# Patient Record
Sex: Female | Born: 1935 | Race: White | Hispanic: No | Smoking: Former smoker
Health system: Southern US, Community
[De-identification: ages and names within clinical notes are randomized; demographics above are authoritative.]

## PROBLEM LIST (undated history)

## (undated) DIAGNOSIS — K922 Gastrointestinal hemorrhage, unspecified: Secondary | ICD-10-CM

## (undated) DIAGNOSIS — I4891 Unspecified atrial fibrillation: Secondary | ICD-10-CM

## (undated) DIAGNOSIS — M545 Low back pain: Secondary | ICD-10-CM

## (undated) DIAGNOSIS — R5381 Other malaise: Secondary | ICD-10-CM

## (undated) DIAGNOSIS — E039 Hypothyroidism, unspecified: Secondary | ICD-10-CM

## (undated) DIAGNOSIS — IMO0001 Reserved for inherently not codable concepts without codable children: Secondary | ICD-10-CM

## (undated) DIAGNOSIS — G8929 Other chronic pain: Secondary | ICD-10-CM

## (undated) DIAGNOSIS — R109 Unspecified abdominal pain: Secondary | ICD-10-CM

## (undated) DIAGNOSIS — D75839 Thrombocytosis, unspecified: Secondary | ICD-10-CM

## (undated) DIAGNOSIS — N189 Chronic kidney disease, unspecified: Secondary | ICD-10-CM

## (undated) DIAGNOSIS — M1990 Unspecified osteoarthritis, unspecified site: Secondary | ICD-10-CM

## (undated) DIAGNOSIS — R0602 Shortness of breath: Secondary | ICD-10-CM

## (undated) DIAGNOSIS — D473 Essential (hemorrhagic) thrombocythemia: Secondary | ICD-10-CM

## (undated) DIAGNOSIS — K279 Peptic ulcer, site unspecified, unspecified as acute or chronic, without hemorrhage or perforation: Secondary | ICD-10-CM

## (undated) DIAGNOSIS — F419 Anxiety disorder, unspecified: Secondary | ICD-10-CM

## (undated) DIAGNOSIS — I10 Essential (primary) hypertension: Secondary | ICD-10-CM

## (undated) DIAGNOSIS — J189 Pneumonia, unspecified organism: Secondary | ICD-10-CM

## (undated) DIAGNOSIS — J9690 Respiratory failure, unspecified, unspecified whether with hypoxia or hypercapnia: Secondary | ICD-10-CM

## (undated) DIAGNOSIS — J449 Chronic obstructive pulmonary disease, unspecified: Secondary | ICD-10-CM

## (undated) DIAGNOSIS — D649 Anemia, unspecified: Secondary | ICD-10-CM

## (undated) DIAGNOSIS — R51 Headache: Secondary | ICD-10-CM

## (undated) DIAGNOSIS — N2889 Other specified disorders of kidney and ureter: Secondary | ICD-10-CM

## (undated) DIAGNOSIS — G500 Trigeminal neuralgia: Secondary | ICD-10-CM

## (undated) DIAGNOSIS — C189 Malignant neoplasm of colon, unspecified: Secondary | ICD-10-CM

## (undated) DIAGNOSIS — N3020 Other chronic cystitis without hematuria: Secondary | ICD-10-CM

## (undated) HISTORY — DX: Essential (hemorrhagic) thrombocythemia: D47.3

## (undated) HISTORY — DX: Malignant neoplasm of colon, unspecified: C18.9

## (undated) HISTORY — DX: Thrombocytosis, unspecified: D75.839

## (undated) HISTORY — PX: VAGINAL HYSTERECTOMY: SUR661

## (undated) HISTORY — PX: APPENDECTOMY: SHX54

## (undated) HISTORY — DX: Other malaise: R53.81

## (undated) HISTORY — DX: Anemia, unspecified: D64.9

## (undated) HISTORY — PX: CATARACT EXTRACTION W/ INTRAOCULAR LENS  IMPLANT, BILATERAL: SHX1307

---

## 1958-03-23 HISTORY — PX: CHOLECYSTECTOMY: SHX55

## 2002-09-27 ENCOUNTER — Encounter: Payer: Self-pay | Admitting: Emergency Medicine

## 2002-09-27 ENCOUNTER — Encounter: Admission: RE | Admit: 2002-09-27 | Discharge: 2002-09-27 | Payer: Self-pay | Admitting: Emergency Medicine

## 2002-10-06 ENCOUNTER — Encounter: Payer: Self-pay | Admitting: Family Medicine

## 2002-10-06 ENCOUNTER — Encounter: Admission: RE | Admit: 2002-10-06 | Discharge: 2002-10-06 | Payer: Self-pay | Admitting: Family Medicine

## 2002-10-20 ENCOUNTER — Emergency Department (HOSPITAL_COMMUNITY): Admission: EM | Admit: 2002-10-20 | Discharge: 2002-10-20 | Payer: Self-pay | Admitting: Emergency Medicine

## 2002-11-28 ENCOUNTER — Encounter: Payer: Self-pay | Admitting: Otolaryngology

## 2002-11-28 ENCOUNTER — Encounter: Admission: RE | Admit: 2002-11-28 | Discharge: 2002-11-28 | Payer: Self-pay | Admitting: Otolaryngology

## 2003-09-12 ENCOUNTER — Encounter: Admission: RE | Admit: 2003-09-12 | Discharge: 2003-09-12 | Payer: Self-pay | Admitting: Obstetrics and Gynecology

## 2003-10-16 ENCOUNTER — Encounter: Admission: RE | Admit: 2003-10-16 | Discharge: 2003-10-16 | Payer: Self-pay | Admitting: Internal Medicine

## 2003-11-22 HISTORY — PX: PERCUTANEOUS PINNING FEMORAL NECK FRACTURE: SUR1014

## 2003-11-22 HISTORY — PX: LACERATION REPAIR: SHX5168

## 2003-12-04 ENCOUNTER — Ambulatory Visit (HOSPITAL_COMMUNITY): Admission: RE | Admit: 2003-12-04 | Discharge: 2003-12-04 | Payer: Self-pay | Admitting: Gastroenterology

## 2003-12-11 ENCOUNTER — Inpatient Hospital Stay (HOSPITAL_COMMUNITY): Admission: EM | Admit: 2003-12-11 | Discharge: 2003-12-14 | Payer: Self-pay | Admitting: Emergency Medicine

## 2003-12-14 ENCOUNTER — Ambulatory Visit: Payer: Self-pay | Admitting: Physical Medicine & Rehabilitation

## 2003-12-14 ENCOUNTER — Inpatient Hospital Stay: Admission: RE | Admit: 2003-12-14 | Discharge: 2003-12-26 | Payer: Self-pay | Admitting: *Deleted

## 2004-01-28 ENCOUNTER — Ambulatory Visit (HOSPITAL_COMMUNITY): Admission: RE | Admit: 2004-01-28 | Discharge: 2004-01-28 | Payer: Self-pay | Admitting: Orthopedic Surgery

## 2004-07-17 ENCOUNTER — Encounter: Admission: RE | Admit: 2004-07-17 | Discharge: 2004-07-17 | Payer: Self-pay | Admitting: Internal Medicine

## 2004-12-09 ENCOUNTER — Ambulatory Visit: Admission: RE | Admit: 2004-12-09 | Discharge: 2004-12-09 | Payer: Self-pay | Admitting: Internal Medicine

## 2005-01-09 ENCOUNTER — Encounter: Admission: RE | Admit: 2005-01-09 | Discharge: 2005-01-09 | Payer: Self-pay | Admitting: Internal Medicine

## 2005-07-14 ENCOUNTER — Ambulatory Visit: Admission: RE | Admit: 2005-07-14 | Discharge: 2005-07-14 | Payer: Self-pay | Admitting: Gynecologic Oncology

## 2005-11-10 ENCOUNTER — Encounter: Payer: Self-pay | Admitting: Cardiology

## 2005-11-18 ENCOUNTER — Ambulatory Visit (HOSPITAL_COMMUNITY): Admission: RE | Admit: 2005-11-18 | Discharge: 2005-11-18 | Payer: Self-pay | Admitting: Internal Medicine

## 2005-12-09 ENCOUNTER — Ambulatory Visit: Admission: RE | Admit: 2005-12-09 | Discharge: 2005-12-09 | Payer: Self-pay | Admitting: Gynecologic Oncology

## 2005-12-21 HISTORY — PX: BILATERAL SALPINGOOPHORECTOMY: SHX1223

## 2006-01-11 ENCOUNTER — Emergency Department (HOSPITAL_COMMUNITY): Admission: EM | Admit: 2006-01-11 | Discharge: 2006-01-11 | Payer: Self-pay | Admitting: Emergency Medicine

## 2006-01-19 ENCOUNTER — Encounter (INDEPENDENT_AMBULATORY_CARE_PROVIDER_SITE_OTHER): Payer: Self-pay | Admitting: *Deleted

## 2006-01-19 ENCOUNTER — Ambulatory Visit (HOSPITAL_COMMUNITY): Admission: RE | Admit: 2006-01-19 | Discharge: 2006-01-19 | Payer: Self-pay | Admitting: Gynecologic Oncology

## 2006-02-03 ENCOUNTER — Ambulatory Visit: Admission: RE | Admit: 2006-02-03 | Discharge: 2006-02-03 | Payer: Self-pay | Admitting: Gynecologic Oncology

## 2006-08-18 ENCOUNTER — Emergency Department (HOSPITAL_COMMUNITY): Admission: EM | Admit: 2006-08-18 | Discharge: 2006-08-18 | Payer: Self-pay | Admitting: Emergency Medicine

## 2006-08-26 ENCOUNTER — Ambulatory Visit: Payer: Self-pay | Admitting: Gastroenterology

## 2007-01-24 DIAGNOSIS — I10 Essential (primary) hypertension: Secondary | ICD-10-CM

## 2007-01-24 DIAGNOSIS — J309 Allergic rhinitis, unspecified: Secondary | ICD-10-CM

## 2007-01-24 DIAGNOSIS — E039 Hypothyroidism, unspecified: Secondary | ICD-10-CM

## 2007-08-08 DIAGNOSIS — F411 Generalized anxiety disorder: Secondary | ICD-10-CM

## 2007-08-08 DIAGNOSIS — Z9189 Other specified personal risk factors, not elsewhere classified: Secondary | ICD-10-CM | POA: Insufficient documentation

## 2007-08-08 DIAGNOSIS — F329 Major depressive disorder, single episode, unspecified: Secondary | ICD-10-CM

## 2007-08-24 ENCOUNTER — Encounter: Admission: RE | Admit: 2007-08-24 | Discharge: 2007-08-24 | Payer: Self-pay | Admitting: Gastroenterology

## 2007-11-30 ENCOUNTER — Ambulatory Visit: Payer: Self-pay | Admitting: Vascular Surgery

## 2008-01-17 ENCOUNTER — Inpatient Hospital Stay (HOSPITAL_COMMUNITY): Admission: AD | Admit: 2008-01-17 | Discharge: 2008-01-20 | Payer: Self-pay | Admitting: Internal Medicine

## 2009-06-06 ENCOUNTER — Encounter: Payer: Self-pay | Admitting: Cardiology

## 2009-07-22 ENCOUNTER — Encounter: Admission: RE | Admit: 2009-07-22 | Discharge: 2009-07-22 | Payer: Self-pay | Admitting: Cardiology

## 2009-11-14 ENCOUNTER — Ambulatory Visit: Payer: Self-pay | Admitting: Cardiology

## 2010-04-12 ENCOUNTER — Encounter: Payer: Self-pay | Admitting: Internal Medicine

## 2010-04-13 ENCOUNTER — Encounter: Payer: Self-pay | Admitting: *Deleted

## 2010-08-05 NOTE — Letter (Signed)
August 26, 2006    Kari Baars, M.D.  [address]  [place], [place] [address]   RE:  [name], [name]  MRN:  [id number]  /  DOB:  09-05-1930   Dear Dr. Clelia Croft:   Upon your kind referral, I had the pleasure of evaluating your patient  and I am pleased to offer my findings.  I saw Natalie [name] in the  office today.  Enclosed is a copy of my progress note that details my  findings and recommendations.   Thank you for the opportunity to participate in your patient's care.    Sincerely,      Barbette Hair. Arlyce Dice, MD,FACG  Electronically Signed    RDK/MedQ  DD: 08/26/2006  DT: 08/26/2006  Job #: [id number]

## 2010-08-05 NOTE — Procedures (Signed)
DUPLEX DEEP VENOUS EXAM - LOWER EXTREMITY   INDICATION:   HISTORY:  Edema:  Bilateral lower extremity edema.  Trauma/Surgery:  Patient had a left hip fracture four years ago.  Pain:  Left leg pain.  PE:  No.  Previous DVT:  No.  Anticoagulants:  No.  Other:  Patient is on antihypertensive.   DUPLEX EXAM:                CFV   SFV   PopV  PTV    GSV                R  L  R  L  R  L  R   L  R  L  Thrombosis    O  o  o  o  o  o  o   o  o  o  Spontaneous   +  +  +  +  +  +  +   +  +  +  Phasic        +  +  +  +  +  +  +   +  +  +  Augmentation  +  +  +  +  +  +  +   +  +  +  Compressible  +  +  +  +  +  +  +   +  +  +  Competent     +  +  +  +  +  +  +   +  +  +   Legend:  + - yes  o - no  p - partial  D - decreased   IMPRESSION:  1. No evidence of deep venous thrombus, superficial venous thrombus,      or baker's cyst bilaterally.  2. No significant venous reflux identified bilaterally in the deep or      superficial system.    _____________________________  Di Kindle. Edilia Bo, M.D.   MC/MEDQ  D:  11/30/2007  T:  11/30/2007  Job:  [id number]

## 2010-08-05 NOTE — H&P (Signed)
Natalie [name], Natalie [name]             ACCOUNT NO.:  [id number]   MEDICAL RECORD NO.:  [id number]          PATIENT TYPE:  INP   LOCATION:  5120                         FACILITY:  [facility]   PHYSICIAN:  Kari Baars, M.D.  DATE OF BIRTH:  1930/07/24   DATE OF ADMISSION:  01/17/2008  DATE OF DISCHARGE:                              HISTORY & PHYSICAL   CHIEF COMPLAINT:  Abdominal pain.   HISTORY OF PRESENT ILLNESS:  Natalie [name] is a 77 year old white female  with a history of irritable bowel syndrome, chronic pain syndrome, and  prior left adnexal mass status post laparoscopic bilateral salpingo-  oophorectomy (October 2007) with benign pathology who presented to the  office today with persistent abdominal pain for the past 3 weeks.  The  patient has a long history of chronic abdominal pain for which she has  seen several physicians including Dr. Jeani Hawking, Dr. Melvia Heaps,  and Dr. Vida Rigger.  This has been extensively evaluated in the past  with repeated CT scan.  Her most recent CT scan prior to this episode  was in June 2009 at [facility], which showed no acute findings  or abnormalities.  Her last colonoscopy was in September 2005 by Dr.  Elnoria Howard and did reveal a small rectal polyp.  He recommended at that time  followup colonoscopy in 5-7 years.  I do not have the pathology from  that.  She was last seen in our office on January 03, 2008, following a  medication change for her antihypertensive regimen.  She was having some  lower extremity swelling on Lotrel and was switched to Lotensin HCT.  She states that this was a pill from hell and associated it with  increased fatigue and blurry vision.  Her swelling had resolved off the  amlodipine, and it was recommended that she continue this medication for  several more weeks to see if her side effects would resolve given her  prior medication intolerances.  She subsequently called on January 12, 2008, reporting severe  abdominal pain for several weeks.  She was taking  Darvocet and Ultram for this pain without relief.  At that time, we have  recommended a repeat CT of the abdomen and pelvis which was performed at  [facility] on January 13, 2008.  The radiologist called me with the  report indicating acute sigmoid diverticulitis would suspect contained  microperforation but no abscess.  Of note, there also was a left renal  lesion, suspicious for solid mass with recommendations for followup  evaluation.  The prior left adnexal cystic mass, which was removed in  October 2007 was completely resolved.  Based on the CT findings, she was  started on Cipro and Flagyl 5 days ago.  She called this weekend with  persistent abdominal pain and it was recommended she be evaluated in the  emergency department.  She did not follow this recommendation and was  contacted again yesterday with persistent abdominal pain.  We offered an  office visit, but she stated that she was unable to come in.  We have  recommended that she go  to the emergency department via EMS, which she  declined.  This appointment was scheduled for her today, at which time  she states that she is continuing to have severe lower quadrant pain.  She describes this as sharp pain which is worse with movement.  It is  similar to her prior pain but has been much worse over the past 3 weeks.  There appears to be no change in her bowel movement consistency.  She  had a bowel movement this morning.  She denies blood in her stool.  No  nausea or vomiting.  No fevers, chills, or sweats.  She does have  decrease in appetite and fatigue.  She does not feel that her pain is  any better with the Cipro and Flagyl.  Therefore, she will be admitted  for further evaluation and management.   PAST MEDICAL HISTORY:  1. Hypertension.  2. Hypothyroidism.  3. Osteoporosis.  4. Irritable bowel syndrome.  5. Left adnexal mass status post laparoscopic bilateral  salpingo-      oophorectomy (October 2007).  6. Trigeminal neuralgia.  7. History of shingles, left shoulder.  8. Chronic pain syndrome.  9. Multiple medication intolerances.  10.Left hip fracture status post pinning (2006).  11.Status post cholecystectomy.  12.Status post hysterectomy.  13.She thinks she has had her appendix out as well.   CURRENT MEDICATIONS:  1. Lorazepam 1 mg t.i.d.  2. Levothyroxine 100 mcg one-half daily.  3. Aspirin 81 mg daily.  4. Ultram 50 mg t.i.d. p.r.n.  5. Lotensin HCT 10/12.5 daily.   ALLERGIES:  1. Multiple SSRIs.  2. LYRICA.   SOCIAL HISTORY:  She is a widow of 16 years.  She is divorced.  She has  3 children and 3 grandsons.  Retired.  She previously worked with her  daughter who owns [employer].  She is a positive smoker, about 5-6  cigarettes per day.  Social alcohol for 40 years.  Denies any alcohol  abuse.  No drug use.   FAMILY HISTORY:  Father died at 32 of old age complications.  Mother  died at 51 from stroke.  She has a brother with coronary disease status  post stent.  Her 2 daughters and son are healthy.   PHYSICAL EXAMINATION:  VITAL SIGNS:  Temperature 98.0, pulse 72, blood  pressure 148/90, respirations 16, and weight 148 pounds and stable.  GENERAL:  She appears comfortable when sitting but grimaces when she  moves.  HEENT:  Pupils equal, round, and reactive to light.  Extraocular  movements intact.  Oropharynx moist without erythema.  NECK:  Supple without lymphadenopathy or JVD.  HEART:  Regular rate and rhythm without murmurs, rubs, or gallops.  LUNGS:  Clear to auscultation bilaterally.  ABDOMEN:  Soft and nondistended with normoactive bowel sounds.  She does  have right lower quadrant tenderness.  No mass appreciated.  No rebound  or guarding.  SKIN:  No rash.  EXTREMITIES:  No clubbing, cyanosis, or edema.  NEUROLOGIC:  Alert and oriented x4.   STUDIES:  CT of the abdomen and pelvis with contrast (January 13, 2008,  at [facility]) acute sigmoid diverticulitis would suspect contained  microperforation.  No mature abscess formation is evident.  Followup  recommended.  Small left lower kidney lesion, suspect for solid mass.  Further assessment with dedicated pre and postcontrast renal CT is  recommended.  Focal mid right cortical kidney volume loss/scar.  Multiple low density right renal lesions, likely minimally complex renal  cyst.  Small hiatal hernia.  Nondilatory calcific atherosclerotic  vascular disease.  Lumbar scoliosis with degenerative osseous changes.  Resolved left adnexal cystic mass.   Labs obtained on January 03, 2008, include a BMET which was normal  except for sodium of 130 which was stable.  CBC was normal.  Thyroid  function normal.  B12 greater than 1000.   ASSESSMENT/PLAN:  1. Bilateral lower quadrant abdominal pain - her subacute presentation      over the past 3 weeks and lack of fever is a bit atypical for      diverticulitis, though her CT on January 13, 2008, was suggestive      that this is the etiology of her pain.  Given her persistent      symptoms and abdominal tenderness, we will admit her for further      evaluation and management with IV antibiotics including Cipro and      Flagyl and pain management with Ultram and morphine as needed.  We      will repeat a CT with contrast to further evaluate the cause of her      pain and to rule out associated abscess as a complication of      diverticulitis.  If no clear cause is seen, we will consider GI or      surgical consult.  She does have a history of chronic pain, which      makes interpretation of her symptoms a bit difficult.  Obtain CBC,      CMET, lactic acid, urinalysis, and blood cultures.  2. Renal mass - we will further evaluate with this CT with plans to      evaluate this further as an outpatient.  I do not think that this      is at all related her pain.  3. Hypertension - continue Lotensin HCT for  now.  Her blood pressure      is of marginal control today on a lower dose of this medication      since her last visit.  4. Hypothyroidism - continue Synthroid.  Recent thyroid function      normal.  5. Deep venous thrombosis prophylaxis with Lovenox.  6. Fluids, electrolytes, nutrition - clear liquid diet.  We will      advance as tolerated with cardiac prudent.  7. Disposition - anticipate that she will be able to return home once      her symptoms are improving.  Her daughter lives in town and has      been available to help as needed.      Kari Baars, M.D.  Electronically Signed     WS/MEDQ  D:  01/17/2008  T:  01/18/2008  Job:  [id number]   cc:   Petra Kuba, M.D.

## 2010-08-05 NOTE — Assessment & Plan Note (Signed)
[facility]                         GASTROENTEROLOGY OFFICE NOTE   NAME:Natalie [name], Natalie [name]                    MRN:          [id number]  DATE:08/26/2006                            DOB:          01-25-1931    PROBLEM:  Abdominal pain.   Natalie [name] is a 77 year old, white female referred through the courtesy  of Dr. Clelia Croft for evaluation.  She has been suffering from chronic, lower  abdominal pain.  She describes the pain as radiating from her right  upper quadrant down to her suprapubic area.  It is chronic.  It is not  affected by eating or moving her bowels.  She has undergone extensive  workup in the past including CT scans and colonoscopy.  Nothing  diagnostic was found.  She is addicted to Lorazepam.  She has panic  attacks and claims that she is chronically nervous because of her pain.  There is no history of melena or hematochezia.  Appetite is good and  weight is stable.  She was told in the past to have fibromyalgia.  She  suffers from anxiety, depression and chronic headaches.  She is status  post cholecystectomy, hysterectomy and bilateral oophorectomy.   FAMILY HISTORY:  Noncontributory.   MEDICATIONS:  Lorazepam, Benazepril, Norvasc, baby aspirin, Synthroid  and Macrobid.   ALLERGIES:  No known drug allergies.   SOCIAL HISTORY:  She smokes two to six cigarettes a day.  She does not  drink.  She is widowed and retired.   REVIEW OF SYSTEMS:  Positive for joint pains, back pains, severe  fatigue, shortness of breath and muscle cramps.   PHYSICAL EXAMINATION:  VITAL SIGNS:  Pulse 64, blood pressure 114/66,  weight 163.  HEENT:  EOMI. PERRLA. Sclerae are anicteric.  Conjunctivae are pink.  NECK:  Supple without thyromegaly, adenopathy or carotid bruits.  CHEST:  Clear to auscultation and percussion without adventitious  sounds.  CARDIAC:  Regular rhythm; normal S1 S2.  There are no murmurs, gallops  or rubs.  ABDOMEN:  There is mild  periumbilical tenderness without guarding or  rebound.  There are no abdominal masses or organomegaly.  Bowel sounds  are normoactive.  Abdomen is soft, non-tender and non-distended.  EXTREMITIES:  Full range of motion.  No cyanosis, clubbing or edema.  RECTAL:  Deferred.   IMPRESSION:  1. Chronic abdominal pain.  I believe that she has chronic abdominal      pain syndrome.  Fibromyalgia may be contributing.  I think it is      unlikely that she has intra-abdominal pathology including colonic      disease aside from irritable bowel syndrome.  2. Anxiety and depression.  3. Lorazepam addiction.   RECOMMENDATION:  1. Trial of Elavil 50 mg nightly (will start with 25 mg nightly x1      week).  2. If symptoms are not improved with therapy with Elavil, I will refer      her to the pain management clinic.     Barbette Hair. Arlyce Dice, MD,FACG  Electronically Signed    RDK/MedQ  DD: 08/26/2006  DT: 08/26/2006  Job #: [id number]  cc:   Kari Baars, M.D.

## 2010-08-08 NOTE — Consult Note (Signed)
NAMEALONNI, Natalie [name]             ACCOUNT NO.:  [id number]   MEDICAL RECORD NO.:  [id number]          PATIENT TYPE:  INP   LOCATION:  0101                         FACILITY:  [facility]   PHYSICIAN:  Elliot Cousin, M.D.    DATE OF BIRTH:  04-29-1930   DATE OF CONSULTATION:  12/11/2003  DATE OF DISCHARGE:                                   CONSULTATION   REASON FOR CONSULTATION:  Medical clearance and medical management.   HISTORY OF PRESENT ILLNESS:  The patient is a 77 year old lady with a  history of hypertension, anxiety, and hypothyroidism, who presented to the  emergency room  today after sustaining a fall this morning.  The patient was  on the way back from taking out her trash when she mis-stepped on her steps  outside at home.  The patient fell over on her left side sustaining a left  forehead contusion, left hand abrasion, and left pelvic and hip pain.  When  the patient arrived to the emergency department, a radiograph of her left  hip and pelvis were ordered.  The radiograph revealed that the patient had  impacted left mid cervical hip fracture.  The patient does have some  intermittent left groin and hip pain which is radiating down her thigh. The  patient states that the pain is more intense if she tries to raise or  manipulate her left leg.  The patient had no previous history of falls.  She  had no black out spells and she denied being dizzy this morning.  She denied  any loss of consciousness prior to or following the fall.  She denied any  prodromal chest pain, shortness of breath, or dizziness.  She has had no  recent fever, chills, night sweats, or pleurisy.  She was recently started  on treatment with amoxicillin for sinusitis.  The patient's past medical  history is negative for any history of myocardial infarction, heart failure,  coronary artery disease, or stroke.  She does have hypertension and she says  her blood pressures are well controlled on Lotrel 5/20 mg  daily.   PAST MEDICAL HISTORY:  1.  Hypertension.  2.  Hypothyroidism.  3.  Irritable bowel syndrome.  4.  Recent colonoscopy last week by Dr. Elnoria Howard, reported as negative.  5.  Chronic anxiety.  6.  Seasonal allergies.  7.  Recent treatment for sinusitis.  8.  Status post cholecystectomy in the past.  9.  Status post total abdominal hysterectomy secondary to fibroids      approximately 40 years ago.   MEDICATIONS:  1.  Vicodin 5 mg q.6h. p.r.n. pain.  2.  Zoloft 12.5 mg daily.  3.  Lotrel 5/10 mg daily.  4.  Levothyroxine 50 mg daily.  5.  Lorazepam 1 mg t.i.d. p.r.n.  6.  Metamucil daily.  7.  Bentyl 10 mg q.6h. p.r.n. irritable bowel symptoms.   ALLERGIES:  No known drug allergies.   SOCIAL HISTORY:  The patient is widowed.  She lives in [place] alone.  She has three grown children.  Her daughter, Antony Odea, is present  today.  Ms.  Parker's phone number is [phone].  The patient is a part owner  of a local clothing store.  She still drives.  She can read and write.  She  drinks occasional alcohol.  She states she is a closet smoker.  She smokes  5-6 cigarettes per day and has been doing so on and off for the past 40  years.  The patient states she is not very active but she is totally and  independently ambulatory.   FAMILY HISTORY:  The patient's father died of prostate cancer and myocardial  infarction at the age of 50.  Her mother died at 32 years of age secondary  to a stroke.  She has one brother who is 74 years of age and has a history  of coronary artery disease status post angioplasty and hypertension.   REVIEW OF SYMPTOMS:  Positive for anxiousness and chronic anxiety.  She does  have occasional nasal congestion and sinus headaches.  She, otherwise, has a  negative review of systems.   PHYSICAL EXAMINATION:  VITAL SIGNS:  Temperature 97.3, blood pressure 140/89, pulse 95, respiratory  rate 18, oxygen saturation 93%.  GENERAL:  The patient is a  pleasant, overweight 77 year old Caucasian lady  who is currently lying in bed in no acute distress.  HEENT:  Head is normocephalic.  The patient does have a left sided  abrasion/contusion above the left eye on the lateral forehead.  There is  mild swelling and mild oozing of blood.  The patient has had no trauma to  her eyes.  Pupils equal, round, reactive to light.  Extraocular movements  intact.  Conjunctivae clear.  Sclerae white.  Nasal mucosa is mildly dry.  The patient has no sinus tenderness on exam of her ethmoid sinuses and her  frontal and maxillary sinuses.  No nasal drainage.  Oropharynx reveals  mildly dry mucous membranes.  The patient's teeth are in good condition.  No  posterior exudates or erythema.  NECK:  Supple, no adenopathy, no thyromegaly, no JVD, no bruits.  LUNGS:  Clear to auscultation bilaterally although there are some decreased  breath sounds in the bases.  No evidence of wheezing or crackles.  HEART:  S1 and S2 with an ectopic beat with a soft systolic murmur.  ABDOMEN:  Well healed right upper quadrant scar.  The abdomen is obese,  positive bowel sounds, soft, nontender, nondistended, no hepatosplenomegaly,  no masses palpated.  EXTREMITIES:  The patient does have some tenderness over the left groin and  left hip area.  No tenderness of her joints or extremities otherwise.  The  patient has good range of motion of all her joints with the exception of the  left lower extremity.  She has trace pedal edema bilaterally.  Pedal pulses  are palpable bilaterally.  She has mild arthritic changes in her knees and  hands bilaterally.  SKIN:  As stated above, the patient has a mild abrasion and contusion of her  left forehead.  NEUROLOGICAL:  The patient is alert and oriented x 3.  Cranial nerves 2-12  are intact.  Strength is 5/5 throughout with the exception of the left lower extremity which is restricted in motion secondary to pain and fracture.  Sensation is  intact throughout.   ADMISSION LABORATORY DATA:  EKG reveals normal sinus rhythm with nonspecific  T wave abnormalities and PACs.  Chest x-ray reveals mild peribronchial  thickening and mild bibasilar atelectasis.  Left hip film reveals a left  impacted mid cervical  hip fracture.  WBC 17.8, hemoglobin 13.3, hematocrit  39.3, MCV 87, platelets 365.  Sodium 128, potassium 3.8, chloride 95, CO2  25, glucose 119, BUN 8, creatinine 0.5, calcium 9.1, total protein 6.1,  albumin 3.6, AST 74, ALT 52, alkaline phosphatase 73, total bilirubin 1.1.   ASSESSMENT:  1.  The patient is a 77 year old lady who presents with a left hip fracture      secondary to an accidental fall.  No indications of syncope or loss of      consciousness secondary to the fall or prior to the fall.  There are no      focal neurological findings on exam with the exception of a decrease in      mobility secondary to the left hip fracture.  The patient's EKG is      unremarkable and the patient's chest x-ray is without overt      cardiomegaly.  Her surgical risk is average for her age.  She has no      apparent cardiovascular disease with the exception of hypertension.      There is no immediate reason to not proceed with the surgical correction      of the left hip fracture.  2.  Leukocytosis.  The leukocytosis may simply be a reactive leukocytosis      secondary to the fall.  We will also consider sinusitis and bronchitis      as possible etiologies for the leukocytosis.  The patient is, however,      afebrile.  3.  Hyponatremia.  The patient's serum sodium on admission is 128.  The      hyponatremia may simply be secondary to volume depletion and possibly      secondary to the affects of Zoloft which can cause hyponatremia.  4.  Hypertension.  The patient is treated as an outpatient with Lotrel 5/20      mg daily.  5.  Hypothyroidism.  The patient is treated with Levothyroxine 50 mcg daily.   PLAN/RECOMMENDATIONS:  1.   Would proceed with the surgery.  2.  Avoid perioperative hypotonic IV fluids.  Would recommend normal saline      with potassium chloride added rather than half normal saline.  3.  Recommend starting empiric Avelox to cover both sinusitis and      bronchitis.  4.  Wound care to the left forehead abrasion.  5.  Restart home medications.  6.  Recheck a CBC and chemistry panel in the a.m.  7.  Thank you for the consult, will follow with you.      DF/MEDQ  D:  12/11/2003  T:  12/11/2003  Job:  [id number]   cc:   Ralene Ok, M.D.  [address]  [place]  [place] [address]  Fax: [phone]

## 2010-08-08 NOTE — Consult Note (Signed)
Natalie [name], CROAK             ACCOUNT NO.:  [id number]   MEDICAL RECORD NO.:  [id number]          PATIENT TYPE:  OUT   LOCATION:  GYN                          FACILITY:  [facility]   PHYSICIAN:  Paola A. Duard Brady, MD    DATE OF BIRTH:  May 09, 1930   DATE OF CONSULTATION:  12/09/2004  DATE OF DISCHARGE:  12/09/2004                                   CONSULTATION   REFERRING PHYSICIAN:  Dr. Katherine Mantle.  The patient is seen today in  consultation at the request of Dr. Laurena Bering.   Ms. Flicker is a 77 year old, who was referred to [place] secondary to a complex  adnexal mass.  Her history dates back per my records to July 2004.  At that  time, she had a 4.1 x 3.8 cm cystic lesion within the mid pole of the right  kidney.  Within pelvis, she had a 4.6 x 4.2 cm left adnexal cystic lesion as  well as significant diverticula.  It was decided to follow this, and she had  a repeat ultrasound September 12, 2003, which revealed a 5.3 x 4.4 x 5.7 cm  cystic abnormality within the left adnexa with a single curvilinear internal  septation.  No significant mural nodularity.  They could not identify the  right ovary.  Again, this lesion continued to be followed.  A CT scan was  obtained October 16, 2003, at which time she had a 5.8 x 4.6 cm mass in the  left adnexa which was complex in appearance.  And this continued to be  followed and most recently, she had a CT scan April 2006, at which time  there was a cystic lesion within the adnexa measuring 6.4 x 5.0 cm.  It was  felt to be larger than the prior CT scan of the pelvis in July 2004,  appeared to be slow growing.  She was seen in consultation by Dr. Soledad Gerlach  in May 2006 to follow up these lesions.  Of note, she has always had a  normal CA 125, the most recent one being in May 2006 which was 14.7.  At  that time, there had been a lengthy discussion with the patient and her  daughter.  It was recommended to proceed with surgical resection of the left  adnexal  mass with BSO and frozen section, and it was felt that this could be  accomplished laparoscopically.  It was also subsequently recommended that if  she wished not to proceed with surgery, she would have repeat imaging in 3  months, and the patient was to follow up with him which did not occur.  She  returns to see [place] today.  Of note, she has otherwise been fairly well.  She  complains of occasional UTI.  She states she has lost about 12 pounds in the  past year and a half secondary to significant stress, and her daughter  interjects that she has a significant amount of stress as related to this  pelvic mass.  She has been not sure when she was menopausal.  She does not  have recollection of hot  flashes.  She does complain of pain for about a  year, but it is difficult to attribute that to the pelvic mass versus her  irritable bowel syndrome or her hip fracture.  She fell and injured her hip  a year ago and has several pins on the left side.  She has complained of  being full and bloated with eating.  She has gastroesophageal reflux disease  which was diagnosed since her irritable bowel syndrome was diagnosed about 2  years ago.  Her IBS is characterized by 50% of the time she has diarrhea.  The other 50%, she has constipation.  She denies any bright red blood per  rectum.  She had a negative colonoscopy a year ago.   PAST GYN HISTORY:  She is a gravida 3, para 3, has never been on hormone  replacement therapy.  She had a vaginal hysterectomy for benign disease.   REVIEW OF SYSTEMS:  As above.   PAST MEDICAL HISTORY:  1.  Hypertension.  2.  Hypothyroidism.  3.  Irritable bowel syndrome.  4.  Gastroesophageal reflux disease.   PAST SURGICAL HISTORY:  1.  Vaginal hysterectomy.  2.  Cholecystectomy, open.  3.  Left hip fracture a year ago.   MEDICATIONS:  Lotrel, Synthroid, Ativan, baby aspirin, Prilosec, Metamucil.   ALLERGIES:  None.   SOCIAL HISTORY:  She is widowed.  She has 2  daughters and a son.  She smokes  cigarettes occasionally.  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   HEALTH MAINTENANCE:  She had a negative colonoscopy in 2005.  She has never  had a mammogram.   PHYSICAL EXAMINATION:  VITAL SIGNS:  Height 5 feet 3 inches.  Weight 136  pounds.  Blood pressure 130/80, pulse 72, respirations 18.  GENERAL:  Well-nourished, well-developed female in no acute distress.  NECK:  Supple.  There is no lymphadenopathy, no thyromegaly.  LUNGS:  Clear to auscultation bilaterally.  CARDIOVASCULAR EXAM:  Regular rate and rhythm.  ABDOMEN:  Soft, nontender, nondistended.  There are no palpable masses or  hepatosplenomegaly.  Groins were negative for adenopathy.  EXTREMITIES:  There is no edema.  PELVIC/BIMANUAL EXAMINATION:  A 7 cm mass which appears to be mid pelvis  __________ localized to the left side.  It is smooth.  There is no  nodularity.  It is nonmobile.  Rectovaginal confirms.   ASSESSMENT:  A 77 year old with persistent complex-appearing ovarian mass.  The patient went for a repeat ultrasound today since she had not been imaged  since earlier in the spring.  Ultrasound today revealed a persistent mass  with a single septation.  A lengthy discussion was held with the patient and  her daughter at this point.  They are leaning towards wishing to proceed  with laparoscopic evaluation.  She is tentatively scheduled for a  laparoscopic  oophorectomy on October 17.  Risks and benefits of the surgery including  bleeding, infection, injury to surrounding organs, thromboembolic disease  were discussed with the patient and her daughter, and they wish to proceed.  She will come in and be scheduled for a preop appointment.      Paola A. Duard Brady, MD  Electronically Signed     PAG/MEDQ  D:  12/11/2004  T:  12/12/2004  Job:  [id number]   cc:   Andres Ege, M.D.  Fax: [phone]   Ralene Ok, M.D.  Fax: [phone]   S. Kyra Manges, M.D.   Fax: [phone]  Hazle Coca  Fax: [phone]   Telford Nab, R.N.  [address]  [place], [place] [address]

## 2010-08-08 NOTE — Consult Note (Signed)
[name], Natalie [name]             ACCOUNT NO.:  [id number]   MEDICAL RECORD NO.:  [id number]          PATIENT TYPE:  OUT   LOCATION:  GYN                          FACILITY:  [facility]   PHYSICIAN:  Paola A. Duard Brady, MD    DATE OF BIRTH:  1930/12/07   DATE OF CONSULTATION:  DATE OF DISCHARGE:                                 CONSULTATION   Natalie Natalie [name] is a 77 year old who I have been following for several years  for a complex left adnexal mass.  She has been seen very regularly with  a fairly stable mass though the patient complains of significant issues  of pain on a fairly regular basis and has been seen in the emergency  room several times.  She subsequently opted for surgery on January 19, 2006, and underwent diagnostic laparoscopy with bilateral salpingo-  oophorectomy.  At that time, operative findings included a 6-cm simple  unilocular left ovarian cyst with minimal adhesive disease of the  epiploica overlying the cyst.  There was a normal atrophic appearing  right ovary.  Left ovarian cyst fluid was negative.  The cyst was not  ruptured interoperatively in the abdomen, it was within an EndoCatch  bag.  The ovarian mass was negative as were the washings.  She comes in  today for her postoperative check.  She continues to complain of  abdominal pain.  She states the abdominal pain that she currently has is  very similar to the pain that she had before surgery.  She does not  think that we have been able to improve her pain.  She asked several  questions today regarding whether we thought that the pain was because  she was crazy or if the pain was in her head and is related to her  depression.  She was asked whether or not she needed ECT.  The patient,  from a postoperative standpoint, states that her pain is debilitating,  again it was present before the surgery, it wakes her up at night.  This  morning she states she woke up to 1:30 and was not able to fall back  asleep, however,  on further questioning she states that she took a  tramadol later and slept the rest of the evening.  She states she is  eating well.  She complains of constipation secondary to her pain  medication.  She took Metamucil this morning and has not had any benefit  from that but she thinks that most of her symptoms might be related to  her irritable bowel syndrome, though she is discouraged that other  physicians do not think that is the case.  She also believes that she  has fibromyalgia which is also not being adequately treated. I discussed  with her that I do not know the etiology of her pain.  But I do not  think it was related to this ovarian mass nor have I ever felt that it  was related to the ovarian mass.   PHYSICAL EXAMINATION:  VITAL SIGNS:  Weight 134 pounds which is stable.  GENERAL:  Well developed, well nourished female  in no acute distress.  ABDOMEN:  Shows well healed laparoscopy skin incisions.  On abdominal  examination, she has no pain whatsoever to exam. The abdomen is soft and  nontender.  However, when you question the patient whether she has pain  she states yes, though there was no grimacing during the physical exam.   ASSESSMENT:  77 year old with benign serous cystadenoma of the left  ovary and benign right adnexa as well who continues to have chronic  abdominal pain that I do not think is gynecologic in origin.   PLAN:  I would recommend that she continue follow-up with her physicians  regarding her irritable bowel syndrome as well as her fibromyalgias.  I  have also asked her to see Dr. Clelia Croft.  I did speak with Dr. Clelia Croft in April  2007 regarding anti-depressants.  She states that she did discuss this  with him but has not given him an answer. I do think this would be  important follow-up for her.  She does seem depressed and is requesting  prescription for lorazepam.  I gave her a prescription for lorazepam one  p.o. t.i.d.,  #21 for 7 days.  Hopefully in that  interim of time, she can see her  primary physicians regarding this and her workup and treatment for her  pelvic and abdominal pain will continue.  She does not have a malignant  process.  She knows that we will be happy to see her on a p.r.n. basis  if necessary.      Paola A. Duard Brady, MD  Electronically Signed     PAG/MEDQ  D:  02/03/2006  T:  02/03/2006  Job:  [id number]   cc:   Kari Baars, M.D.  Fax: [phone]   Barry Dienes. Eloise Harman, M.D.  Fax: [phone]   Telford Nab, R.N.  [address]  [place], [place] [address]   Edwena Felty. Romine, M.D.  Fax: [phone]

## 2010-08-08 NOTE — Op Note (Signed)
Natalie [name], Natalie [name]             ACCOUNT NO.:  [id number]   MEDICAL RECORD NO.:  [id number]          PATIENT TYPE:  AMB   LOCATION:  DAY                          FACILITY:  [facility]   PHYSICIAN:  Paola A. Duard Brady, MD    DATE OF BIRTH:  1930/04/06   DATE OF PROCEDURE:  01/19/2006  DATE OF DISCHARGE:                                 OPERATIVE REPORT   PREOPERATIVE DIAGNOSIS:  Persistent left adnexal mass, normal CA-125.   POSTOPERATIVE DIAGNOSIS:  Persistent left adnexal mass, normal CA-125.   PROCEDURE:  Laparoscopic bilateral salpingo-oophorectomy.   SURGEON:  Paola A. Duard Brady, M.D. and Roseanna Rainbow, M.D.   ANESTHESIA:  General.   ANESTHESIOLOGIST:  Jenelle Mages. Fortune, M.D.   SPECIMENS:  Bilateral tubes and ovaries, washings and cyst fluid.   ESTIMATED BLOOD LOSS:  Minimal.   IV FLUIDS:  Was 1600 mL.   URINE OUTPUT:  Was 100 mL.   COMPLICATIONS:  None.   OPERATIVE FINDINGS:  Included a 6 cm simple unilocular left ovarian cyst  with minimal adhesive disease of the epiploica overlying the cyst.  Normal  atrophic-appearing right ovary.   DESCRIPTION OF PROCEDURE:  The patient was taken to the operating room,  after an informed consent was reviewed with the patient. The patient was  identified.  She was placed in the supine position.  Her arms were tucked  with the appropriate precautions.  General anesthesia was induced and an O2  tube was placed.  The patient was then placed in the dorsal lithotomy  position.  The abdomen was prepped in the usual sterile fashion.  The  perineum and vagina were prepped in the usual fashion.  A Foley catheter was  inserted into the bladder under sterile conditions.  The patient was then  draped.  Time-out was performed to confirm the patient's surgeon and  procedures.  Then 1 mL of 0.25% Marcaine was injected in the infraumbilical  region.  A 1 cm infraumbilical incision was made with the knife and carried  down to the underlying  fascia using curved Mayo's.  The fascia was  identified, grasped, tented and entered sharply.  The fascial edges were  sutured using a #0 Vicryl and a UR6.  The Hasson was placed into the  abdomen.  The abdomen was insufflated with CO2 gas.  Never did the patient's  pressure exceed 50 mmHg.  Bilateral 5 mm ports were placed under direct  visualization, after placing of 0.25% Marcaine.  The the 5 mm incisions were  made 2 cm above and medial to the ASIS.  Again ports were placed under  direct visualization.  A 10/12 suprapubic port was placed in the routine  fashion.  The patient was then placed in the Trendelenburg position.  The  findings as above were noted.   Our attention was first drawn to the left ovary which was the pathologic  side.  The small epiploica adhesions overlying the ovary were taken down  with the sharp monopolar cautery.  We then opened the posterior leaf of the  broad ligament on the patient's left side.  The posterior  leaf of the broad  ligament incision was then extended superiorly and inferiorly.  The ureter  was identified and a window was made between the ureter and the IP.  The IP  was coagulated with bipolar Kleppinger cautery and transected using  monopolar cautery.  Small filmy adhesions of the bladder to the ovary were  taken down using monopolar cautery.  We then came across the ovarian blood  supply at its attachment near  the round ligament with bipolar cautery.  The  ovary was then transected and was placed in an EndoCatch bag.  The EndoCatch  bag was then brought out of the suprapubic port.  The cyst was ruptured with  clear fluid being aspirated was sent for frozen section.  Frozen section  pathology was consistent with a serous cystadenoma.   Our attention was then drawn to the patient's right side.  Again the  posterior leaf of the broad ligament was opened using monopolar cautery.  The ureter was identified and a window was created between the IP  and the  ureter.  The IP was coagulated using bipolar cautery and transected.  The  adhesive disease of the right adnexa to the round remnant was taken down  using monopolar cautery. Bipolar cautery was used for the large pedicle.  It  was transected using monopolar cautery and delivered out the 10/12 port.  There is noted to be no bleeding.  The pedicles were noted to be hemostatic.  The pedicles at this point were then inspected under low flow and were noted  to be hemostatic.  The ports were all taken out under direct visualization.   The fascial incision at the infraumbilical port was reapproximated and tied  down.  Two additional figure-of-eight sutures were used to close the fascia.  A single figure-of-eight suture in the suprapubic port was used to  reapproximate the fascial incision.  The skin was closed using #4-0 Vicryl.  Steri-Strips with Benzoin were applied.  The sponge stick was removed from  the vagina.   The patient tolerated procedure well was taken to the recovery room in  stable condition.  All instrument, Ray-Tec and needle counts were correct  x2.  Complications none.      Paola A. Duard Brady, MD  Electronically Signed     PAG/MEDQ  D:  01/19/2006  T:  01/19/2006  Job:  [id number]   cc:   Telford Nab, R.N.  [address]  [place], [place] [address]   Roseanna Rainbow, M.D.  Fax: [phone]   W. Buren Kos, M.D.  Fax: [phone]   Barry Dienes. Eloise Harman, M.D.  Fax: [phone]   Edwena Felty. Romine, M.D.  Fax: [phone]

## 2010-08-08 NOTE — H&P (Signed)
Natalie [name], Natalie [name]             ACCOUNT NO.:  [id number]   MEDICAL RECORD NO.:  [id number]          PATIENT TYPE:  EMS   LOCATION:  ED                           FACILITY:  [facility]   PHYSICIAN:  Deidre Ala, M.D.    DATE OF BIRTH:  1930-05-19   DATE OF ADMISSION:  12/11/2003  DATE OF DISCHARGE:                                HISTORY & PHYSICAL   CHIEF COMPLAINT:  Left hip pain.   HISTORY OF PRESENT ILLNESS:  Patient is a 77 year old female who was taking  her garbage out and getting her newspaper earlier this morning when she was  returning to her house and walking up the stairs subsequently tripped and  fell, she had immediate onset of pain to her left hip, she was brought to  the emergency department at [facility] by ambulance where x-rays were taken  and revealed left impacted valgus femoral neck femoral.  Upon reviewing  these x-rays, Dr. Renae Fickle felt it was best to proceed with a left hip pinning.  Risks and benefits of the surgery were discussed with the patient and  patient wished to proceed.   PAST MEDICAL HISTORY:  1.  Hypertension.  2.  Hypothyroidism.  3.  Anxiety.   FAMILY HISTORY:  History of cerebrovascular accident.   SOCIAL HISTORY:  Patient has positive tobacco intake about five cigarettes  per day, positive occasional alcohol intake, she lives alone with three  steps entering the house.   DRUG ALLERGIES:  No known drug allergies.   CURRENT MEDICATIONS:  1.  Lotrel 5/20 mg one p.o. once daily.  2.  Lorazepam 1 mg one p.o. once daily/b.i.d.  3.  Zoloft dosage not known at this time once daily.  4.  Synthroid 50 mcg one p.o. once daily.   REVIEW OF SYSTEMS:  GENERAL:  Positive night sweats.  Denies fevers, chills,  bleeding tendencies.  CNS:  Positive headaches.  Denies blurry/double  vision, seizures, paralysis.  RESPIRATORY:  Denies shortness of breath,  productive cough, hemoptysis.  CV:  Denies chest pain, angina, orthopnea.  GI:  Positive  constipation.  Denies nausea, vomiting, diarrhea, melena,  bloody stools.  GU:  Denies dysuria, hematuria, discharge.  MUSCULOSKELETAL:  Pertinent to HPI.   PHYSICAL EXAMINATION:  GENERAL:  Well-developed, well-nourished 77 year old  female.  NECK:  Supple.  No carotid bruits noted.  CHEST:  Clear to auscultation bilaterally.  No wheezes or crackles.  HEART:  Regular rate and rhythm.  No murmurs, rubs, or gallops.  ABDOMEN:  Soft, nontender, nondistended, positive bowel sounds x4.  EXTREMITIES:  Pain with range of motion of the left hip.  She is  neurovascularly intact distally in the left hip.  She has good pulses on the  left lower extremity.  She is neurovascularly intact distally in the left  lower extremity.  SKIN:  No rashes or lesions.  NEUROLOGIC:  Alert and oriented x4.   RADIOGRAPHS:  Impacted valgus left femoral neck fracture.   IMPRESSION:  Impacted valgus left femoral neck fracture.   PLAN:  Patient will be taken to the operating room today around 4 p.m.  to  undergo pinning of the left hip by Dr. Doristine Section.      SW/MEDQ  D:  12/11/2003  T:  12/11/2003  Job:  [id number]

## 2010-08-08 NOTE — Consult Note (Signed)
Natalie [name], Natalie [name]             ACCOUNT NO.:  [id number]   MEDICAL RECORD NO.:  [id number]          PATIENT TYPE:  OUT   LOCATION:  GYN                          FACILITY:  [facility]   PHYSICIAN:  Paola A. Duard Brady, MD    DATE OF BIRTH:  1930-10-13   DATE OF CONSULTATION:  12/09/2005  DATE OF DISCHARGE:  12/09/2005                                   CONSULTATION   Natalie [name] is a 77 year old whom I have been seeing since September 2006.  She was initially referred to me by Dr. Clelia [name] for a complex adnexal mass.  Per records that we have going back until July 2004, she had a 4 x 4 cm  cystic mass in the kidney and within the pelvis, approximately a 4 x 4 cm  left cystic lesion.  She has also had CTs and multiple repeat ultrasounds  including a CT scan in July 2005 that revealed a 5.8 x 4 cm lesion, an  ultrasound in April 2006 that revealed a 6.5 x 5 cm lesion.  She had been  scheduled for surgery October 17.  The patient cancelled the day prior to  surgery.  She delayed surgery and continued having repeat CTs done in April  2007 secondary to dysuria and pelvic pain.  At that time, she had a 6.5 x  5.5 cm left pelvic mass which 1 septation.  Followup in April 2007 continued  to show a persistent lesion.  I had a very lengthy conversation with her in  April 2007, again recommending surgery, and the patient declined.  I offered  a preoperative anesthesia consult for discussion to see if this could  alleviate her fears and she also declined this.  In addition, I spoke to her  primary physician, Dr. Clelia [name] regarding some of her issues with depression.  He agreed with antidepressants and it was felt that if she felt better she  would agree to surgery, and again she never contacted [place].  We were contacted  by Dr. Alver Fisher office.  They did schedule a repeat ultrasound which was done  on August 29.  It reveals the left adnexa to measure 6.7 x 5.3 x 5.9 cm with  an anechoic mass.  There was a peripheral  soft tissue and septation seen.  There was no free fluid.  This is significantly no different in size than  the previous mass that she has had, and she comes back in for followup.   She has multiple somatic complaints.  She complains of having a UTI last  week which was treated with Azo and Cipro.  Her symptoms resolved.  She is  complaining of increasing congestion.  She states that she cannot take  Benadryl or Claritin as it causes her heart to race and her blood pressure  to go up.  She tried Chlor-Trimeton which made her drowsy.  She does not  want to use Flonase as it contains a steroid.  She also then had a Migun  treatment, which is an infrared massage therapy, and she developed shingles  as a result of this.  She  is complaining of some left hip pain and arthritis-  type pains as well as back pain when she is sitting.  She is scheduled to  have her cataracts done, with one eye being done on September 24 and the  second eye being done 2 weeks later.   PHYSICAL EXAMINATION:  VITAL SIGNS:  Weight 136 pounds.  Blood pressure  116/76.  GENERAL:  Well-nourished, well-developed female in no acute distress.  ABDOMEN:  Soft and nontender.  GENITOURINARY EXAMINATION:  The cervix is surgically absent.  On bimanual  examination, there is a mass in the left lower quadrant.  There is no  nodularity.  It is difficult to ascertain if it is mobile.   ASSESSMENT:  A 77 year old with a persistent left-sided 6 cm complex mass  that I have been attempting to perform surgery on for the past year.  She  has cancelled surgery once as it was scheduled in October.  At the time of  her last meeting with me in April, she was to contact me regarding  scheduling a date and the patient continues to not proceed with follow up or  follow up with our recommendations.  I again reviewed with her quite  extensively that while my suspicion is that this is not a malignant process,  that over the past year it has not  appeared to increase significantly in  size, she has developed no free fluid, and her serial CT scans have not  shown any adenopathy.  While this is reassuring, I still cannot ascertain  with 100% certainty that this is not a malignant process.  She states that  she finally feels that she is prepared to have it removed.  I offered to  schedule it for her today, even if we scheduled it a month out, and she was  not willing to let us do that.  She states she cannot consider even  scheduling it even if it comes to be a month or two out, until after she has  her cataracts done.  I have offered to call her to schedule it and see how  she is doing after her cataracts and she declines, stating that she will  call us.  Again, these conversations were had with Natalie [name], my  nurse, in the room.  The patient has been encouraged to have surgical  evaluation but the patient continues not to follow up with our  recommendations.  If continued followup is what the patient wishes to  proceed, this can be done through her primary care physician with serial  ultrasounds.  If the patient wants to consider surgical options, we will be  happy to explore that with her.      Paola A. Duard Brady, MD  Electronically Signed     PAG/MEDQ  D:  12/09/2005  T:  12/11/2005  Job:  [id number]   cc:   Natalie [name], M.D.  Fax: [phone]   Barry Dienes. Eloise Harman, M.D.  Fax: [phone]   Natalie [name], R.N.  [address]  [place], [place] [address]   Edwena Felty. Romine, M.D.  Fax: [phone]

## 2010-08-08 NOTE — Op Note (Signed)
NAME:  Natalie [name], Natalie [name]                       ACCOUNT NO.:  [id number]   MEDICAL RECORD NO.:  [id number]                   PATIENT TYPE:  AMB   LOCATION:  ENDO                                 FACILITY:  [facility]   PHYSICIAN:  Jordan Hawks. Elnoria Howard, MD                 DATE OF BIRTH:  14-Jul-1930   DATE OF PROCEDURE:  12/04/2003  DATE OF DISCHARGE:                                 OPERATIVE REPORT   PROCEDURE:  Colonoscopy.   ENDOSCOPIST:  Jordan Hawks. Elnoria Howard, M.D.   EQUIPMENT USED:  An adult Olympus colonoscope.   INDICATIONS FOR PROCEDURE:  For abdominal pain and for screening.   PHYSICAL ASSESSMENT:  HEART:  A regular rate and rhythm.  LUNGS:  Clear to auscultation bilaterally.  ABDOMEN:  Flat, soft, nontender, nondistended.   MEDICATIONS:  Versed 10 mg IV and Demerol 100 mg IV.   CONSENT:  An informed consent was obtained from the patient, describing the  risks of bleeding, infection, perforation, medication reaction, a 10% miss  rate for a small colon cancer and the risk of death; all of which are not  exclusive of any other complications that can occur.   DESCRIPTION OF PROCEDURE:  After adequate sedation was achieved, while in  the left lateral decubitus position, the colonoscope was inserted from the  anus into the cecum under direct visualization.  Any pools of fluid were  aspirated.  The prep was noted to be very good.  There is no evidence of  masses, inflammation, erosions, or ulcerations noted in the cecum, the  ascending colon, the transverse colon, descending colon, or sigmoid colon.  Upon further withdrawal and in retroflexion, a small 2 to 3 mm rectal polyp  was noted, and cold snare was employed to resect the lesion.  No bleeding  complications were noted from the resection.  Upon aspiration the polyp,  because of its small size and fragility, was not able to be recovered from  the aspirate in an intact form.  Additionally, the patient was also noted to  have moderate  external hemorrhoids.  The patient tolerated the procedure well, and no complications were  encountered.   PLAN:  At this time the patient will be recommended to have a follow-up  colonoscopy in five to seven years.  She is to maintain a high-fiber diet.                                               Jordan Hawks Elnoria Howard, MD    PDH/MEDQ  D:  12/04/2003  T:  12/04/2003  Job:  [id number]   cc:   Ralene Ok, M.D.  [address]  [place]  [place] [address]  Fax: [phone]

## 2010-08-08 NOTE — Op Note (Signed)
NAMELOGANN, WHITEBREAD             ACCOUNT NO.:  [id number]   MEDICAL RECORD NO.:  [id number]          PATIENT TYPE:  INP   LOCATION:  0484                         FACILITY:  [facility]   PHYSICIAN:  Deidre Ala, M.D.    DATE OF BIRTH:  09-Apr-1930   DATE OF PROCEDURE:  12/11/2003  DATE OF DISCHARGE:                                 OPERATIVE REPORT   PREOPERATIVE DIAGNOSES:  1.  Valgus femoral neck fracture, stable, with slight displacement.  2.  A 1 centimeter laceration, left forehead and eyebrow.   POSTOPERATIVE DIAGNOSES:  1.  Valgus femoral neck fracture, stable, with slight displacement.  2.  A 1 centimeter laceration, left forehead and eyebrow.   PROCEDURES:  1.  Closed reduction and percutaneous pinning of femoral neck fracture.  2.  Repair, laceration, left eyebrow, 1 cm.   SURGEON:  1.  Charlesetta Shanks, M.D.   ASSISTANT:  Clarene Reamer, P.A.-C.   ANESTHESIA:  General endotracheal.   CULTURES:  None.   DRAINS:  None.   ESTIMATED BLOOD LOSS:  Minimal.   PATHOLOGIC FINDINGS AND HISTORY:  Natalie [name] is a relatively healthy 77  year old who fell today, sustaining this fracture.  Her regular medical  doctor is Elvina Sidle, M.D.  Her hospitalist today was Elliot Cousin,  M.D.  She was cleared for surgery.  There was some slight lateral  translation of the femoral head on the neck, and reduction maneuver improved  the position to a classic impacted valgus, which was fixed with four 7.0  cannulated cancellous screws, Ace type, with a Y configuration.  The C-arm  fluoroscopy confirmed position on AP and lateral view.   PROCEDURE:  With adequate anesthesia obtained using endotracheal technique,  1 g Ancef given IV prophylaxis, the patient was placed in the supine  position on the fracture table.  We then cleansed and repaired with 4-0  nylon the laceration over her left eyebrow.  This was a single-layer  closure.  I then did a closed reduction maneuver on her hip,  abduction,  external rotation, then internal rotation, and traction.  With extensive  traction I could not pull the fracture apart, so it was relatively stable.  We did improve the position of the neck and the head.  We then affixed her  to the fracture table, Jackson type, with the right leg in a stirrup.  I  then percutaneously pinned with four pins using the drill guide, Ace  cannulated cancellous type, in a Y configuration with C-arm fluoroscopy  confirming position.  Irrigation was carried out.  Those wounds were then closed with staples.  A  bulky sterile compressive dressing was applied.  The patient, having  tolerated the procedure well, was awakened and taken to the recovery room in  satisfactory condition, to be admitted for routine postoperative care.      VEP/MEDQ  D:  12/11/2003  T:  12/12/2003  Job:  [id number]   cc:   Elvina Sidle, M.D.  [address] [place]  [place] [address]  Fax: [phone]   Elliot Cousin, M.D.

## 2010-08-08 NOTE — Consult Note (Signed)
Natalie [name], Natalie [name]             ACCOUNT NO.:  [id number]   MEDICAL RECORD NO.:  [id number]          PATIENT TYPE:  OUT   LOCATION:  GYN                          FACILITY:  [facility]   PHYSICIAN:  Paola A. Duard Brady, MD    DATE OF BIRTH:  1931/02/13   DATE OF CONSULTATION:  07/14/2005  DATE OF DISCHARGE:                                   CONSULTATION   Ms. Cake is a 77 year old who I initially saw in September 2006.  She was  referred to me by Dr. Laurena Bering.  She had a complex adnexal mass.  Per the  records we have going back to July 2004, she had a 4.1 x 3.8-cm cystic  lesion in the kidney, within the pelvis she had a 4.6 x 4.2-cm left adnexa  cystic lesion, as well as significant diverticular disease.   FOLLOWUP:  Primarily by CT scan, in July 2005, revealed a 5.8 x 4.6-cm  lesion.  April 2006, she had a 6.5 x 5-cm lesion.  When I saw her, December 09, 2004, she was tentatively scheduled for surgery for January 06, 2005.  She wished to proceed.  The patient called our office the day prior surgery  and cancelled secondary to further workup being performed by her primary  physician.  She was contacted by our office again.  She had not completed  her workup as her primary physician thought she needs a Cardiolite test.  She states she felt sick all over, and she placed her surgery on hold.  We  were then contacted by her primary physician, when she had a repeat CT scan  done, June 23, 2005, secondary to dysuria, pelvic pain.  At that time, it  revealed a 6.5 x of 5.5-cm left lower pelvic hypodense lesion with fluid  density.  There was one septation which has been noticed in the past.  There  was no adenopathy.  No other changes consistent with an ovarian carcinoma.  I spent 30 minutes face-to-face time with the patient today.  She has  multiple somatic complaints.  She is complaining of pain every time she  eats.  She states her IBS is flaring and it is 50/50% constipation versus  diarrhea.  She is worried about living along, wants to move from her house,  is stressed at about her grandson, etc.  She is on lorazepam and feels that  she is becoming immune to it.  She states she may very well be depressed.  She otherwise denies any complaints.   PAST MEDICAL HISTORY:  Is unchanged, she has:  1.  Hypertension.  2.  Hypothyroidism.  3.  Irritable bowel syndrome.  4.  Gastroesophageal reflux disease.   PAST SURGICAL HISTORY:  1.  Vaginal hysterectomy.  2.  Open cholecystectomy.  3.  A left hip fracture 2 years ago.   MEDICATIONS:  Include Synthroid, hydroxyzine, Lotrel, lorazepam, baby  aspirin, Ultram, Claritin, and Neurontin which she stopped in April 2007.   PHYSICAL EXAMINATION:  VITAL SIGNS:  Weight 132 pounds which is down 4  pounds from September 2006.  GENERAL:  A well-nourished, well  developed, thin female in no acute  distress.  NECK:  Supple with no lymphadenopathy, no thyromegaly.  ABDOMEN:  Soft, nontender, nondistended.  There are no palpable masses or  hepatosplenomegaly.  Groins were negative for adenopathy.  EXTREMITIES:  There is no edema.  PELVIC:  Bimanual examination does reveal a mass within the left lower  quadrant which is smooth.  I was not able to appreciate it her last exam,  can appreciate it today.  Rectal confirms there is no nodularity.   ASSESSMENT:  40.  A 77 year old with a left lower quadrant/left adnexal pelvic mass that      over about two years has increased slightly in size.  I discussed with      her that I do not feel that this is a malignancy, however, that cannot      be completely excluded.  2.  With regards to her weight, her weight does seem to be fairly stable      over long term.  3.  I do think the patient is depressed and may benefit from antidepressants      versus an increase in her anxiolytics.  4.  With regards to her pain.  The mass may be contributing, however, this      would only be something known in  retrospect, as she may have a component      of pain from her irritable bowel syndrome.   1.  I recommended to her that she needs to proceed with surgical evaluation.      I believe this could be done laparoscopically.  She states that she      dreadfully afraid of being put to sleep for evaluation and consultation      with anesthesia to see if that would alleviate her fears and she      declined.  2.  I did call her primary physician, Dr. Clelia Croft, and discussed the situation      with him.  He will be contacting the patient.  He does agree with      antidepressants.  She has been on many per his records, and she has had      side effects to all of them.  At this point, we left it that the patient      understands our recommendations for surgery, and she is to contact us      should she make a decision to proceed.  Her questions were answered      extensively.  Her questions were elicited and answered to her      satisfaction.      Paola A. Duard Brady, MD  Electronically Signed     PAG/MEDQ  D:  07/14/2005  T:  07/15/2005  Job:  [id number]   cc:   Martha Clan, Dr.   Barry Dienes. Eloise Harman, M.D.  Fax: [phone]   Telford Nab, R.N.  [address]  [place], [place] [address]   Edwena Felty. Romine, M.D.  Fax: [phone]

## 2010-08-08 NOTE — Discharge Summary (Signed)
Natalie [name], Natalie [name]             ACCOUNT NO.:  [id number]   MEDICAL RECORD NO.:  [id number]          PATIENT TYPE:  INP   LOCATION:  0484                         FACILITY:  [facility]   PHYSICIAN:  Deidre Ala, M.D.    DATE OF BIRTH:  February 25, 1931   DATE OF ADMISSION:  12/11/2003  DATE OF DISCHARGE:  12/14/2003                                 DISCHARGE SUMMARY   ADMISSION DIAGNOSES:  1.  Impacted valgus left femoral neck fracture.  2.  Hypertension.  3.  Hypothyroidism.  4.  Irritable bowel syndrome.  5.  Chronic anxiety.  6.  Seasonal allergies.   DISCHARGE DIAGNOSES:  1.  Left valgus impacted femoral neck fracture status post left hip pinning.  2.  Hypertension.  3.  Hypothyroidism.  4.  Irritable bowel syndrome.  5.  Chronic anxiety.  6.  Seasonal allergies.  7.  Postoperative hemorrhagic anemia stable at time of discharge.   PROCEDURES:  The patient was taken to the operating room on December 11, 2003 and underwent closed reduction percutaneous pinning of the left hip;  surgeon was Dr. Doristine Section; assistant was Clarene Reamer, P.A.-C.; surgery  was done under general anesthesia; there was no drain placed at the time of  surgery.   CONSULTATIONS:  1.  Hospitalist consult with Dr. Elliot Cousin.  2.  Physical therapy.  3.  Occupational therapy.  4.  Social work case Insurance account manager.   BRIEF HISTORY:  Patient is a 77 year old female who was taking her garbage  out and getting her newspaper early in the morning on December 11, 2003  when she was turning at her house, walking up the stairs and subsequently  tripped and fell.  She had immediate onset of pain to her left hip.  She was  then brought to the emergency department at [facility] by ambulance where x-  rays were taken and revealed left impacted valgus femoral neck fracture.  While reviewing these x-rays, Dr. Renae Fickle felt it was best to proceed with left  hip pinning, patient agreed.  Risks and benefits of the surgery  were  discussed with the patient and patient wished to proceed.   LABORATORY DATA:  CBC on admission showed a hemoglobin of 13.3, hematocrit  39.3, white blood cell count 17.8, serial H&H's were followed throughout the  hospital stay, hemoglobin and hematocrit did decline to 10.8 and 32.1,  respectively at the time of discharge.  White blood cells on December 13, 2003 were also at 9.2 in the normal range.  Differential on admission showed  neutrophils high at 90, lymphocytes low at 4.  Coagulation studies on  admission showed PTT slightly low at 23.  PT and INR at the time of  discharge were 19.7 and 2.1, respectively, on Coumadin therapy.  Urinalysis  on admission showed cloudy appearance with small amount of hemoglobin, 15  mg/dl of ketones and hyaline casts.  Patient's blood type is B positive with  antibody screen negative.  EKG on admission showed sinus rhythm with  premature atrial complexes with an otherwise normal EKG.  Radiographs taken  on December 11, 2003 of the chest reveal mild basilar atelectasis/scarring  right greater than left with mild peribronchial thickening.  X-rays of the  left ankle revealed osteopenia without acute abnormality.  Radiographs of  the left hip reveal impacted mid-cervical left hip fracture with impaction  and valgus deformity.  Postop x-rays reveal near anatomic reduction of left  femoral neck fracture following Knowles pinning.   HOSPITAL COURSE:  The patient was admitted to [facility] and taken  to the operating room where she underwent the above-stated procedure.  Patient tolerated procedure well and then returned to recovery room on  orthopedic floor to continue postoperative care.  Patient was followed along  closely by Dr. Elliot Cousin for hospital medical management, adjusted  medications appropriately until the patient was stable for discharge to  [facility].  Orthopedically postop day #1 patient was resting comfortably with no   complaints, hemoglobin and hematocrit 11.2 and 32.7, dressing had some  slight bloody drainage with no signs or symptoms of infection, she is  neurovascularly intact in the left lower extremity, she working with  physical therapy/occupational therapy to do touch-down weightbearing.  Postop day #2, December 13, 2003, patient's hemoglobin and hematocrit 11.2  and 34.3, T-max of 100.1, neurovascularly intact to the left lower  extremity, incision was clean, dry, and intact, she is to continue working  with physical therapy/occupational therapy, Foley was discontinued on this  day and she was hopeful for rehab in the next 1-2 days.  December 14, 2003,  postop day #3, patient was resting comfortably, hemoglobin and hematocrit  10.8 and 32.1, incision is clean, dry, and intact, she is neurovascularly  intact to the left lower extremity, she is to continue working with physical  therapy/occupational therapy, Coumadin per pharmacy protocol with the hope  to be discharged to [facility] today or tomorrow as long as okay with Dr. Sherrie Mustache.   DISPOSITION:  Patient will be discharged to [facility] subacute care unit  today December 14, 2003 or possibly December 15, 2003 as long as cleared  medically by Dr. Elliot Cousin.   DISCHARGE MEDICATIONS:  1.  Colace 100 mg one p.o. b.i.d.  2.  Trinsicon one caplet p.o. t.i.d.  3.  Coumadin per pharmacy protocol.  4.  Norvasc 5 mg p.o. daily.  5.  Lotensin 20 mg p.o. daily.  6.  Synthroid 50 mcg p.o. daily.  7.  Multivitamin one p.o. daily.  8.  Miacalcin nasal spray one puff nasally every day alternating nostrils.  9.  Avelox 400 mg one p.o. every 2200 hours.  10. Claritin 10 mg p.o. at bedtime.  11. Zoloft 12.5 mg p.o. daily.  12. Zoloft 25 mg p.o. daily.  13. Potassium chloride 20 mEq p.o. daily.  14. Laxative of choice one unit p.o. p.r.n.  15. Enema of choice one unit PR p.r.n.  16. Reglan 10 mg p.o. q.8h. p.r.n. 17. Tylenol 325 to 650 mg by mouth  q.4h. p.r.n.  18. Robaxin 500 mg one p.o. q.6-8h. p.r.n.  19. Restoril 15 mg p.o. at bedtime p.r.n.  20. Benadryl 25 mg p.o. at bedtime p.r.n.  21. Ativan 1 mg p.o. t.i.d. p.r.n.  22. Robitussin-DM syrup 10 mL p.o. q.4h. p.r.n.  23. Xopenex 0.63 mg inhaled q.4h. p.r.n.  24. Vicodin one to two p.o. q.4-6h. p.r.n. pain.  25. Darvocet-N 100 one to two p.o. q.4-6h. p.r.n. pain.   DIET:  As tolerated.   ACTIVITY:  Patient is strict touch-down weightbearing to the left lower  extremity.  WOUND CARE:  Patient had daily dressing changes performed until no drainage.  Staples are removed at 2 weeks from the date of surgery.   FOLLOW UP:  Patient is to follow up with Dr. Renae Fickle 2 weeks from the date of  surgery or upon discharge from subacute care unit whichever is later.  The  office will be called for an appointment at [phone].   CONDITION ON DISCHARGE:  Stable and improved.      SW/MEDQ  D:  12/14/2003  T:  12/14/2003  Job:  [id number]

## 2010-08-08 NOTE — Discharge Summary (Signed)
Natalie [name], Natalie [name]             ACCOUNT NO.:  [id number]   MEDICAL RECORD NO.:  [id number]          PATIENT TYPE:  ORB   LOCATION:  4533                         FACILITY:  [facility]   PHYSICIAN:  Ellwood Dense, M.D.   DATE OF BIRTH:  1931/01/13   DATE OF ADMISSION:  12/14/2003  DATE OF DISCHARGE:  12/26/2003                                 DISCHARGE SUMMARY   DISCHARGE DIAGNOSES:  1.  Left hip pinning.  2.  Hypertension.  3.  History of depression with high levels of anxiety.  4.  Hypothyroidism.   HISTORY OF PRESENT ILLNESS:  [name] is a 77 year old female with a  history of hypertension and anxiety disorder, who fell on September 20th  sustaining a left impacted femoral neck fracture.  She was evaluated by  [facility] physicians and cleared for surgery same day by Dr. Renae Fickle.  The  patient underwent left hip pinning on September 20th and is touchdown  weightbearing, with Coumadin being used for DVT prophylaxis.  Issues past  surgery have included problems with an  electrolyte abnormality as well as a  productive cough secondary to bronchitis.  She was started on Avelox with  some p.r.n. nebs.  She reports some complaints of left upper extremity pain  with activity.  Therapies were initiated, and the patient has been  progressing along.  Currently, she is min-assist for transfers, min to mod-  assist for ambulating four steps with max queuing.   PAST MEDICAL HISTORY:  1.  Anxiety.  2.  Cholecystectomy.  3.  Hysterectomy.  4.  Seasonal allergies.  5.  Hypothyroidism.  6.  Nasal congestion with headaches occasionally.  7.  Hypertension.  8.  Anxiety/depression.  9.  IBS.   ALLERGIES:  No known drug allergies.   SOCIAL HISTORY:  The patient lives alone, was independent but sedentary  prior to admission.  She smokes four to five cigarettes a day, uses alcohol  occasionally.   HOSPITAL COURSE:  [name] was admitted to [facility] on December 14, 2003, for cycled  level therapies to consist of PT and OT daily.  At the time  of admission, the patient was maintained on Claritin q.h.s.  She was also  maintained on Coumadin for DVT prophylaxis.  Pain control was reasonable  with the use of p.r.n. Darvocet.  Zoloft was continued at 12.5 mg with  dosage increased to 25 mg after September 24th.  The patient continued with  high levels of anxiety requiring Ativan 1 mg on a t.i.d. to a q.i.d. basis.  Her Zoloft was further increased to 50 mg a day on December 21, 2003.  The  patient continues to report issues with high levels of anxiety and depressed  mood at time of discharge.  She is advised to follow up with Dr. Wynonia Lawman  regarding further adjustment of medicine.  The patient has displayed high  levels of anxiety during the stay, especially concerning her progress.  She  continues on touchdown weightbearing for the next few weeks.  Weightbearing  status will be advanced by Dr. Renae Fickle.  During her stay in subacute,  Ms.  Natalie [name] has made good progress.  She progressed to begin modified-  independent level for ambulation of greater than 100 feet with a rolling  walker.  She was at modified-independent for ADLs.  The patient's hip  incision was noted to be healing well without any signs or symptoms of  infection, no drainage and no erythema noted.  Staples were dc 'd, and the  area was steri-stripped on postop day four without difficulty.  The patient  has expressed high levels of anxiety with most meds.  She prefers using  Tylenol alone for pain control.  Darvocet and Robaxin p.r.n. are encouraged  for use past discharge.   LABORATORY DATA:  Labs done past admission showed postop anemia to be  improving, with hemoglobin at 11.3, hematocrit 34.4, white count of 5, and  platelets of 384.  Check of lytes showed electrolyte abnormalities to have  been resolved with sodium 136, potassium 4.6, chloride 100, CO2 28, BUN 9,  creatinine 0.6, glucose 100.  A UUC done at  admission showed 38,000 colonies  of staphs; however, patient remained symptomatic.  The patient did report  some issues regarding pressure on October 4th.  A UUC was redone.  A urine  __________ is negative.  We will contact the patient with results of urine  culture if position.   Further followup therapies include Home Health PT/OT by [facility].  Home Health RN has been arranged to draw [facility] next on December 27, 2003.  Coumadin to continue until October 22nd.  On December 26, 2003, the patient is  discharged to home.   DISCHARGE MEDICATIONS:  1.  Coumadin 2 mg p.o. q. p.m.  2.  Lotrel 5/25 per day.  3.  Miacalcin Nasal Spray one squirt daily.  4.  Sotalol 50 mg a day.  5.  Claritin 10 mg a day.  6.  Darvocet N-100 1-2 four times a day p.r.n.  7.  Robaxin 500 mg q.i.d. p.r.n. spasms.  8.  Ativan 1 mg q.i.d. p.r.n.   ACTIVITY:  Touchdown weightbearing on left lower extremity.  Use walker or  wheelchair.   WOUND CARE:  Keep the area clean and dry.   SPECIAL INSTRUCTIONS:  No alcohol, no smoking, no driving.  [facility] to provide PT and RN.   FOLLOWUP:  The patient is to follow up with Dr. Renae Fickle, Dr. Ludwig Clarks, and Dr.  Wynonia Lawman in the next few weeks.  Follow up with Dr. Lamar Benes as needed.       PP/MEDQ  D:  12/26/2003  T:  12/26/2003  Job:  [id number]   cc:   Ellie Lunch, M.D.  Fax: [phone]   Henri Medal, MD  Fax: [phone]   Lynden Ang, M.D.  [address]  [place], [place] [address]  Fax: [phone]

## 2010-08-08 NOTE — Discharge Summary (Signed)
Natalie [name], Natalie [name]             ACCOUNT NO.:  [id number]   MEDICAL RECORD NO.:  [id number]          PATIENT TYPE:  INP   LOCATION:  5120                         FACILITY:  [facility]   PHYSICIAN:  Kari Baars, M.D.  DATE OF BIRTH:  1930-12-16   DATE OF ADMISSION:  01/17/2008  DATE OF DISCHARGE:  01/20/2008                               DISCHARGE SUMMARY   DISCHARGE DIAGNOSES:  1. Acute sigmoid diverticulitis.  2. Chronic abdominal pain, questionable etiology.  Functional versus      adhesions versus irritable bowel syndrome.  3. Hyponatremia/hypokalemia, likely secondary to hydrochlorothiazide.  4. Hypertension.  5. Hypothyroidism.  6. Osteoporosis.  7. Left adnexal mass status post laparoscopic bilateral salpingo-      oophorectomy (October 2007).  8. Trigeminal neuralgia.  9. History of left shoulder shingles.  10.Chronic pain syndrome.  11.Severe anxiety disorder with multiple medication intolerances.  12.Left hip fracture, status post pinning (2006).  13.Status post cholecystectomy.  14.Status post hysterectomy.  15.Presumably status post appendectomy.   DISCHARGE MEDICATIONS:  1. Cipro 500 mg b.i.d. x7 days.  2. Flagyl 500 mg q.8 h. x7 days.  3. Ultram 50 mg 1-2 q.8 h. p.r.n. pain.  4. Norvasc 2.5 mg daily.  5. Aspirin 81 mg daily.  6. Levothyroxine 100 mcg one-half daily.  7. Lotensin 40 mg daily.  8. Stop hydrochlorothiazide secondary to hyponatremia.   HOSPITAL PROCEDURES:  CT of the abdomen and pelvis with contrast  (January 17, 2008).  Sigmoid diverticulosis with slight inflammatory  stranding surrounding the distal sigmoid colon compatible with early  active diverticulitis.  No acute findings in the abdomen.  Stable  bilateral renal low-density lesions.  Large duodenal diverticulum  unchanged with a small hiatal hernia.   HISTORY OF PRESENT ILLNESS:  For full details please see dictated  history and physical.  Briefly, Natalie [name] is a 77 year old white  female with a history of chronic abdominal pain secondary to irritable  bowel syndrome, prior left adnexal mass resection (October 2007) with  benign pathology who presented to the office with a complaint of  abdominal pain for 3 weeks.  The patient has a long history of chronic  abdominal pain for which she has seen several gastroenterologists  including Dr. Jeani Hawking, Dr. Melvia Heaps, and Dr. Vida Rigger.  This  has been extensively evaluated in the past with repeated CT scan and  colonoscopy in September 2005, which was normal except for a small  rectal polyp.  Follow up colonoscopy was recommended for 5-7 years.  The  patient has had fluctuating abdominal pain over the years, but has had a  significant increase in abdominal pain over the past 3 weeks.  She was  actually seen in our office on January 03, 2008, for blood pressure  follow up after recent medication changes.  She was not tolerating these  changes well.  She called back on January 12, 2008, complaining of  severe abdominal pain that was unrelieved with Darvocet and Ultram.  We  sent her for a CT of the abdomen and pelvis on January 13, 2008, which  was reported as  acute sigmoid diverticulitis with suspect contain  microperforation, but no abscess.  She was started on Cipro and Flagyl  at that time for diverticulitis.  She called over the weekend with  persistent abdominal pain and was recommended that she be evaluated in  the emergency department, which she did not do.  She called on the day  prior to admission, was offered an office visit and also referred to the  emergency department, which she declined.  She is scheduled to visit to  be seen on the day of admission at which time she reported persistent  bilateral lower quadrant pain.  She states that it was sharp and worse  with certain movements.  She had had a bowel movement on the morning of  admission with no blood.  No nausea or vomiting.  No fever, chills, or   sweats.  Given persistent symptoms despite treatment for diverticulitis,  she was admitted for further management and evaluation.   HOSPITAL COURSE:  The patient was admitted to a medical bed.  She was  placed on IV Cipro and Flagyl for diverticulitis.  A CT of the abdomen  and pelvis was performed that showed very mild early sigmoid  diverticulitis.  No abscess was seen and no other causes of abdominal  pain was identified.  With IV antibiotics and one dose of IV morphine,  the patient's pain improved significantly.  She was tolerating p.o.'s  and was anxious for discharge from the hospital.  Of note, her blood  pressure remained problematic throughout this hospitalization.  On the  Lotensin HCT that she was on at home, her blood pressure remained  elevated.  Her Lotensin dose was increased.  She had continued  hyponatremia and hypokalemia.  Therefore, hydrochlorothiazide will be  discontinued as it is a likely culprit.  Norvasc will be restarted with  warning that she may have some recurrence of lower extremity edema,  which she has had in the past with amlodipine.  At this point, the  patient is stable for discharge home.  To complete 14-day course of  Cipro and Flagyl.   Of note, the patient was noted on her initial CT scan to have a possible  renal mass on the left kidney.  On the CT performed in the hospital when  compared to prior CTs in June 2009, and October 2007, there was no  significant change.  Therefore, no further evaluation is warranted at  this point.   DISCHARGE DIET:  Cardiac prudent.   DISCHARGE INSTRUCTIONS:  She was instructed to call if she has  increasing abdominal pain or fever.   HOSPITAL FOLLOWUP:  She will follow up with Dr. Clelia Croft in 1 week.  She  should have a repeat BMET to assess her sodium and potassium off of the  hydrochlorothiazide.   DISPOSITION:  To home with home health physical therapy and occupational  therapy.      Kari Baars,  M.D.  Electronically Signed     WS/MEDQ  D:  01/22/2008  T:  01/23/2008  Job:  [id number]   cc:   Petra Kuba, M.D.

## 2010-12-22 LAB — BASIC METABOLIC PANEL
BUN: 7
BUN: <1 — ABNORMAL LOW
Calcium: 8.9
Chloride: 96
Creatinine, Ser: 0.51
Creatinine, Ser: 0.7
GFR calc Af Amer: >60
GFR calc non Af Amer: >60
GFR calc non Af Amer: >60
Glucose, Bld: 107 — ABNORMAL HIGH
Potassium: 3.6

## 2010-12-22 LAB — COMPREHENSIVE METABOLIC PANEL
ALT: 18
AST: 22
Albumin: 2.8 — ABNORMAL LOW
Chloride: 93 — ABNORMAL LOW
Creatinine, Ser: 0.57
GFR calc Af Amer: >60
Sodium: 129 — ABNORMAL LOW
Total Bilirubin: 0.6

## 2010-12-22 LAB — CBC
HCT: 36.8
MCV: 88.8
Platelets: 371
Platelets: 373
Platelets: 389
RDW: 12.1
RDW: 12.3
WBC: 7.1
WBC: 7.2

## 2010-12-22 LAB — URINALYSIS, ROUTINE W REFLEX MICROSCOPIC
Glucose, UA: NEGATIVE
Hgb urine dipstick: NEGATIVE
Protein, ur: NEGATIVE
Specific Gravity, Urine: 1.008
pH: 7

## 2010-12-22 LAB — CULTURE, BLOOD (ROUTINE X 2): Culture: NO GROWTH

## 2011-01-20 ENCOUNTER — Encounter (HOSPITAL_COMMUNITY): Payer: Medicare Other

## 2011-01-24 ENCOUNTER — Other Ambulatory Visit (HOSPITAL_COMMUNITY): Payer: Self-pay | Admitting: *Deleted

## 2011-01-28 ENCOUNTER — Other Ambulatory Visit (HOSPITAL_COMMUNITY): Payer: Self-pay | Admitting: *Deleted

## 2011-01-30 ENCOUNTER — Encounter (HOSPITAL_COMMUNITY): Payer: Self-pay

## 2011-01-30 ENCOUNTER — Encounter (HOSPITAL_COMMUNITY)
Admission: RE | Admit: 2011-01-30 | Discharge: 2011-01-30 | Disposition: A | Discharge status: Home / Self Care | Payer: Medicare Other | Source: Ambulatory Visit | Attending: Internal Medicine | Admitting: Internal Medicine

## 2011-01-30 DIAGNOSIS — M810 Age-related osteoporosis without current pathological fracture: Secondary | ICD-10-CM | POA: Insufficient documentation

## 2011-01-30 HISTORY — DX: Essential (primary) hypertension: I10

## 2011-01-30 HISTORY — DX: Hypothyroidism, unspecified: E03.9

## 2011-01-30 HISTORY — DX: Unspecified atrial fibrillation: I48.91

## 2011-01-30 MED ORDER — ZOLEDRONIC ACID 5 MG/100ML IV SOLN
5.0000 mg | INTRAVENOUS | Status: AC
  Administered 2011-01-30: 5 mg via INTRAVENOUS
  Filled 2011-01-30: qty 100

## 2011-01-30 MED ORDER — SODIUM CHLORIDE 0.9 % IV SOLN
INTRAVENOUS | Status: AC
  Administered 2011-01-30: 15:00:00 via INTRAVENOUS

## 2011-03-24 DIAGNOSIS — N2889 Other specified disorders of kidney and ureter: Secondary | ICD-10-CM

## 2011-03-24 HISTORY — DX: Other specified disorders of kidney and ureter: N28.89

## 2011-06-11 ENCOUNTER — Encounter: Payer: Self-pay | Admitting: Cardiology

## 2011-07-21 ENCOUNTER — Encounter (HOSPITAL_COMMUNITY): Payer: Self-pay | Admitting: *Deleted

## 2011-07-21 ENCOUNTER — Emergency Department (HOSPITAL_COMMUNITY): Payer: Medicare Other

## 2011-07-21 ENCOUNTER — Inpatient Hospital Stay (HOSPITAL_COMMUNITY)
Admission: EM | Admit: 2011-07-21 | Discharge: 2011-07-24 | DRG: 378 | Disposition: A | Discharge status: Skilled Nursing Facility | Payer: Medicare Other | Attending: Internal Medicine | Admitting: Internal Medicine

## 2011-07-21 DIAGNOSIS — M810 Age-related osteoporosis without current pathological fracture: Secondary | ICD-10-CM | POA: Diagnosis present

## 2011-07-21 DIAGNOSIS — I10 Essential (primary) hypertension: Secondary | ICD-10-CM | POA: Diagnosis present

## 2011-07-21 DIAGNOSIS — F17200 Nicotine dependence, unspecified, uncomplicated: Secondary | ICD-10-CM | POA: Diagnosis present

## 2011-07-21 DIAGNOSIS — K589 Irritable bowel syndrome without diarrhea: Secondary | ICD-10-CM | POA: Diagnosis present

## 2011-07-21 DIAGNOSIS — E039 Hypothyroidism, unspecified: Secondary | ICD-10-CM | POA: Diagnosis present

## 2011-07-21 DIAGNOSIS — K922 Gastrointestinal hemorrhage, unspecified: Secondary | ICD-10-CM

## 2011-07-21 DIAGNOSIS — F411 Generalized anxiety disorder: Secondary | ICD-10-CM | POA: Diagnosis present

## 2011-07-21 DIAGNOSIS — I4819 Other persistent atrial fibrillation: Secondary | ICD-10-CM | POA: Diagnosis present

## 2011-07-21 DIAGNOSIS — R0602 Shortness of breath: Secondary | ICD-10-CM

## 2011-07-21 DIAGNOSIS — Z8719 Personal history of other diseases of the digestive system: Secondary | ICD-10-CM | POA: Diagnosis present

## 2011-07-21 DIAGNOSIS — N390 Urinary tract infection, site not specified: Secondary | ICD-10-CM | POA: Diagnosis present

## 2011-07-21 DIAGNOSIS — Z7901 Long term (current) use of anticoagulants: Secondary | ICD-10-CM

## 2011-07-21 DIAGNOSIS — K254 Chronic or unspecified gastric ulcer with hemorrhage: Principal | ICD-10-CM | POA: Diagnosis present

## 2011-07-21 DIAGNOSIS — G8929 Other chronic pain: Secondary | ICD-10-CM | POA: Diagnosis present

## 2011-07-21 DIAGNOSIS — F341 Dysthymic disorder: Secondary | ICD-10-CM | POA: Diagnosis present

## 2011-07-21 DIAGNOSIS — M549 Dorsalgia, unspecified: Secondary | ICD-10-CM | POA: Diagnosis present

## 2011-07-21 DIAGNOSIS — N2889 Other specified disorders of kidney and ureter: Secondary | ICD-10-CM | POA: Diagnosis present

## 2011-07-21 DIAGNOSIS — R109 Unspecified abdominal pain: Secondary | ICD-10-CM | POA: Diagnosis present

## 2011-07-21 DIAGNOSIS — D62 Acute posthemorrhagic anemia: Secondary | ICD-10-CM | POA: Diagnosis present

## 2011-07-21 DIAGNOSIS — R519 Headache, unspecified: Secondary | ICD-10-CM

## 2011-07-21 DIAGNOSIS — N289 Disorder of kidney and ureter, unspecified: Secondary | ICD-10-CM | POA: Diagnosis present

## 2011-07-21 DIAGNOSIS — I4891 Unspecified atrial fibrillation: Secondary | ICD-10-CM | POA: Diagnosis present

## 2011-07-21 HISTORY — DX: Gastrointestinal hemorrhage, unspecified: K92.2

## 2011-07-21 HISTORY — DX: Peptic ulcer, site unspecified, unspecified as acute or chronic, without hemorrhage or perforation: K27.9

## 2011-07-21 HISTORY — DX: Shortness of breath: R06.02

## 2011-07-21 HISTORY — DX: Chronic obstructive pulmonary disease, unspecified: J44.9

## 2011-07-21 HISTORY — DX: Trigeminal neuralgia: G50.0

## 2011-07-21 HISTORY — DX: Pneumonia, unspecified organism: J18.9

## 2011-07-21 HISTORY — DX: Other chronic cystitis without hematuria: N30.20

## 2011-07-21 HISTORY — DX: Low back pain: M54.5

## 2011-07-21 HISTORY — DX: Other specified disorders of kidney and ureter: N28.89

## 2011-07-21 HISTORY — DX: Other chronic pain: G89.29

## 2011-07-21 HISTORY — DX: Anxiety disorder, unspecified: F41.9

## 2011-07-21 HISTORY — DX: Headache, unspecified: R51.9

## 2011-07-21 HISTORY — DX: Headache: R51

## 2011-07-21 HISTORY — DX: Unspecified osteoarthritis, unspecified site: M19.90

## 2011-07-21 HISTORY — DX: Unspecified abdominal pain: R10.9

## 2011-07-21 LAB — POCT I-STAT, CHEM 8
BUN: 55 mg/dL — ABNORMAL HIGH (ref 6–23)
Creatinine, Ser: 0.7 mg/dL (ref 0.50–1.10)
Glucose, Bld: 115 mg/dL — ABNORMAL HIGH (ref 70–99)
Hemoglobin: 10.5 g/dL — ABNORMAL LOW (ref 12.0–15.0)
TCO2: 28 mmol/L (ref 0–100)

## 2011-07-21 LAB — DIFFERENTIAL
Basophils Relative: 0 % (ref 0–1)
Lymphocytes Relative: 34 % (ref 12–46)
Lymphs Abs: 4 10*3/uL (ref 0.7–4.0)
Monocytes Absolute: 0.9 10*3/uL (ref 0.1–1.0)
Monocytes Relative: 8 % (ref 3–12)
Neutro Abs: 6.9 10*3/uL (ref 1.7–7.7)
Neutrophils Relative %: 58 % (ref 43–77)

## 2011-07-21 LAB — URINE MICROSCOPIC-ADD ON

## 2011-07-21 LAB — URINALYSIS, ROUTINE W REFLEX MICROSCOPIC
Bilirubin Urine: NEGATIVE
Glucose, UA: NEGATIVE mg/dL
Protein, ur: NEGATIVE mg/dL
Specific Gravity, Urine: 1.03 (ref 1.005–1.030)
Urobilinogen, UA: 0.2 mg/dL (ref 0.0–1.0)

## 2011-07-21 LAB — COMPREHENSIVE METABOLIC PANEL
Alkaline Phosphatase: 35 U/L — ABNORMAL LOW (ref 39–117)
BUN: 53 mg/dL — ABNORMAL HIGH (ref 6–23)
CO2: 28 mEq/L (ref 19–32)
Chloride: 97 mEq/L (ref 96–112)
Creatinine, Ser: 0.73 mg/dL (ref 0.50–1.10)
GFR calc Af Amer: >90 mL/min (ref >90)
GFR calc non Af Amer: 80 mL/min — ABNORMAL LOW (ref >90)
Glucose, Bld: 120 mg/dL — ABNORMAL HIGH (ref 70–99)
Potassium: 3.6 mEq/L (ref 3.5–5.1)
Total Bilirubin: 0.3 mg/dL (ref 0.3–1.2)

## 2011-07-21 LAB — CBC
HCT: 29.6 % — ABNORMAL LOW (ref 36.0–46.0)
Hemoglobin: 10.1 g/dL — ABNORMAL LOW (ref 12.0–15.0)
MCHC: 34.1 g/dL (ref 30.0–36.0)
RBC: 3.44 MIL/uL — ABNORMAL LOW (ref 3.87–5.11)

## 2011-07-21 LAB — PROTIME-INR
INR: 2.23 — ABNORMAL HIGH (ref 0.00–1.49)
Prothrombin Time: 25.1 seconds — ABNORMAL HIGH (ref 11.6–15.2)

## 2011-07-21 MED ORDER — ACETAMINOPHEN 650 MG RE SUPP: 650.0000 mg | Freq: Four times a day (QID) | RECTAL | Status: DC | PRN

## 2011-07-21 MED ORDER — MORPHINE SULFATE 2 MG/ML IJ SOLN
2.0000 mg | Freq: Once | INTRAMUSCULAR | Status: AC
  Administered 2011-07-21: 2 mg via INTRAVENOUS
  Filled 2011-07-21: qty 1

## 2011-07-21 MED ORDER — IOHEXOL 300 MG/ML  SOLN
100.0000 mL | Freq: Once | INTRAMUSCULAR | Status: AC | PRN
  Administered 2011-07-21: 100 mL via INTRAVENOUS

## 2011-07-21 MED ORDER — SODIUM CHLORIDE 0.9 % IV SOLN
80.0000 mg | Freq: Once | INTRAVENOUS | Status: AC
  Administered 2011-07-21: 80 mg via INTRAVENOUS
  Filled 2011-07-21: qty 80

## 2011-07-21 MED ORDER — DEXTROSE 5 % IV SOLN
1.0000 g | Freq: Once | INTRAVENOUS | Status: DC
  Filled 2011-07-21: qty 10

## 2011-07-21 MED ORDER — ONDANSETRON HCL 4 MG PO TABS: 4.0000 mg | ORAL_TABLET | Freq: Four times a day (QID) | ORAL | Status: DC | PRN

## 2011-07-21 MED ORDER — PANTOPRAZOLE SODIUM 40 MG IV SOLR
40.0000 mg | Freq: Two times a day (BID) | INTRAVENOUS | Status: DC
  Administered 2011-07-22 – 2011-07-23 (×5): 40 mg via INTRAVENOUS
  Filled 2011-07-21 (×7): qty 40

## 2011-07-21 MED ORDER — LORAZEPAM 1 MG PO TABS
1.0000 mg | ORAL_TABLET | Freq: Four times a day (QID) | ORAL | Status: DC | PRN
  Administered 2011-07-21 – 2011-07-24 (×9): 1 mg via ORAL
  Filled 2011-07-21 (×9): qty 1

## 2011-07-21 MED ORDER — FENTANYL CITRATE 0.05 MG/ML IJ SOLN
INTRAMUSCULAR | Status: AC
  Filled 2011-07-21: qty 2

## 2011-07-21 MED ORDER — MORPHINE SULFATE 4 MG/ML IJ SOLN
4.0000 mg | Freq: Once | INTRAMUSCULAR | Status: AC
  Administered 2011-07-21: 4 mg via INTRAVENOUS
  Filled 2011-07-21: qty 1

## 2011-07-21 MED ORDER — SODIUM CHLORIDE 0.9 % IJ SOLN: 3.0000 mL | Freq: Two times a day (BID) | INTRAMUSCULAR | Status: DC

## 2011-07-21 MED ORDER — HYDROMORPHONE HCL PF 1 MG/ML IJ SOLN
0.5000 mg | INTRAMUSCULAR | Status: DC | PRN
  Administered 2011-07-21 – 2011-07-22 (×4): 0.5 mg via INTRAVENOUS
  Filled 2011-07-21 (×4): qty 1

## 2011-07-21 MED ORDER — ACETAMINOPHEN 325 MG PO TABS
650.0000 mg | ORAL_TABLET | Freq: Four times a day (QID) | ORAL | Status: DC | PRN
  Filled 2011-07-21: qty 2

## 2011-07-21 MED ORDER — HYDROCODONE-ACETAMINOPHEN 5-500 MG PO TABS: 1.0000 | ORAL_TABLET | Freq: Four times a day (QID) | ORAL | Status: DC | PRN

## 2011-07-21 MED ORDER — FENTANYL CITRATE 0.05 MG/ML IJ SOLN
50.0000 ug | Freq: Once | INTRAMUSCULAR | Status: AC
  Administered 2011-07-21: 50 ug via INTRAVENOUS
  Filled 2011-07-21: qty 2

## 2011-07-21 MED ORDER — IOHEXOL 300 MG/ML  SOLN
20.0000 mL | INTRAMUSCULAR | Status: AC
  Administered 2011-07-21 (×2): 20 mL via ORAL

## 2011-07-21 MED ORDER — LEVOTHYROXINE SODIUM 112 MCG PO TABS
56.0000 ug | ORAL_TABLET | Freq: Every day | ORAL | Status: DC
  Administered 2011-07-22 – 2011-07-24 (×3): 56 ug via ORAL
  Filled 2011-07-21 (×4): qty 0.5

## 2011-07-21 MED ORDER — DEXTROSE 5 % IV SOLN
1.0000 g | INTRAVENOUS | Status: DC
  Administered 2011-07-21 – 2011-07-23 (×3): 1 g via INTRAVENOUS
  Filled 2011-07-21 (×3): qty 10

## 2011-07-21 MED ORDER — ONDANSETRON HCL 4 MG/2ML IJ SOLN: 4.0000 mg | Freq: Four times a day (QID) | INTRAMUSCULAR | Status: DC | PRN

## 2011-07-21 MED ORDER — FENTANYL CITRATE 0.05 MG/ML IJ SOLN
50.0000 ug | Freq: Once | INTRAMUSCULAR | Status: AC
  Administered 2011-07-21: 50 ug via INTRAVENOUS

## 2011-07-21 MED ORDER — CARVEDILOL 12.5 MG PO TABS
12.5000 mg | ORAL_TABLET | Freq: Two times a day (BID) | ORAL | Status: DC
  Administered 2011-07-22 – 2011-07-24 (×4): 12.5 mg via ORAL
  Filled 2011-07-21 (×7): qty 1

## 2011-07-21 MED ORDER — SODIUM CHLORIDE 0.9 % IV SOLN
INTRAVENOUS | Status: DC
  Administered 2011-07-21 – 2011-07-24 (×5): via INTRAVENOUS

## 2011-07-21 MED ORDER — HYDROCODONE-ACETAMINOPHEN 5-325 MG PO TABS
1.0000 | ORAL_TABLET | Freq: Four times a day (QID) | ORAL | Status: DC | PRN
  Administered 2011-07-22 – 2011-07-24 (×5): 1 via ORAL
  Filled 2011-07-21 (×6): qty 1

## 2011-07-21 NOTE — ED Notes (Signed)
3000 unable to take report due to the fact that the patient is not neuro or a stroke patient.  Bed control notified

## 2011-07-21 NOTE — Consult Note (Signed)
Subjective:   HPI  The patient is a 78 year old female who we are asked to see in consultation by the emergency room physician and by Dr. Clelia Croft her primary internist. We are asked to see her in regards to melena which started yesterday. The patient states her stools were very black. She has not been taking any Pepto-Bismol. She has no complaints of hematemesis or epigastric pain or heartburn. She has never had a peptic ulcer to her knowledge. She denies NSAIDs. She has a history of atrial fibrillation and has been on Coumadin. Her PT and INR are elevated at 25.1 and 2.23 respectively. Her hemoglobin is 10.5.  She has a history of diverticulosis and states that she had a colonoscopy done several years ago by Dr. Elnoria [name].  She has been complaining of chronic lower abdominal pains and her daughter told me that Dr. Elnoria [name] told her that she had irritable bowel  syndrome in the past.  A CT scan of the abdomen and pelvis was done today and shows evidence of diverticulosis but no diverticulitis. There is a large duodenal diverticulum. There is a 1.8x1.9 cm mass in the left kidney suspicious for neoplasm.  Past Medical History  Diagnosis Date  . Osteoporosis   . Atrial fibrillation   . Hypertension   . Hypothyroidism    Past Surgical History  Procedure Date  . Gallbladder surgery 1960  . Partial hysterectomy   . Cholecystectomy    History   Social History  . Marital Status: Widowed    Spouse Name: N/A    Number of Children: N/A  . Years of Education: N/A   Occupational History  . Not on file.   Social History Main Topics  . Smoking status: Current Some Day Smoker  . Smokeless tobacco: Not on file  . Alcohol Use: Yes     seldom  . Drug Use: No  . Sexually Active: Not on file   Other Topics Concern  . Not on file   Social History Narrative  . No narrative on file   family history is not on file. Current facility-administered medications:cefTRIAXone (ROCEPHIN) 1 g in dextrose 5 % 50  mL IVPB, 1 g, Intravenous, Once, [employer], PA;  fentaNYL (SUBLIMAZE) injection 50 mcg, 50 mcg, Intravenous, Once, [employer], PA, 50 mcg at 07/21/11 1542;  iohexol (OMNIPAQUE) 300 MG/ML solution 100 mL, 100 mL, Intravenous, Once PRN, Medication Radiologist, MD, 100 mL at 07/21/11 1407 iohexol (OMNIPAQUE) 300 MG/ML solution 20 mL, 20 mL, Oral, Q1 Hr x 2, Gwyneth Sprout, MD, 20 mL at 07/21/11 1136;  morphine 2 MG/ML injection 2 mg, 2 mg, Intravenous, Once, [employer], PA, 2 mg at 07/21/11 1142;  morphine 4 MG/ML injection 4 mg, 4 mg, Intravenous, Once, [employer], PA, 4 mg at 07/21/11 1222 pantoprazole (PROTONIX) 80 mg in sodium chloride 0.9 % 100 mL IVPB, 80 mg, Intravenous, Once, Otilio Miu, PA Current outpatient prescriptions:amLODipine (NORVASC) 5 MG tablet, Take 2.5 mg by mouth daily.  , Disp: , Rfl: ;  aspirin 81 MG chewable tablet, Chew 81 mg by mouth daily.  , Disp: , Rfl: ;  benazepril (LOTENSIN) 40 MG tablet, Take 40 mg by mouth daily.  , Disp: , Rfl: ;  calcium citrate (CALCITRATE - DOSED IN MG ELEMENTAL CALCIUM) 950 MG tablet, Take 1 tablet by mouth 2 (two) times daily.  , Disp: , Rfl:  carvedilol (COREG) 12.5 MG tablet, Take 12.5 mg by mouth 2 (two) times daily with  a meal.  , Disp: , Rfl: ;  dicyclomine (BENTYL) 10 MG capsule, Take 10 mg by mouth 4 (four) times daily -  before meals and at bedtime.  , Disp: , Rfl: ;  HYDROcodone-acetaminophen (VICODIN) 5-500 MG per tablet, Take 1 tablet by mouth daily as needed. , Disp: , Rfl: ;  levothyroxine (SYNTHROID, LEVOTHROID) 112 MCG tablet, Take 56 mcg by mouth daily. , Disp: , Rfl:  LORazepam (ATIVAN) 1 MG tablet, Take 1 mg by mouth 4 (four) times daily as needed. Anxiety., Disp: , Rfl: ;  warfarin (COUMADIN) 2.5 MG tablet, Take 2.5 mg by mouth daily.  , Disp: , Rfl:  Allergies  Allergen Reactions  . Levofloxacin Other (See Comments)    REACTION: unspecified     Objective:     BP  128/78  Pulse 98  Temp(Src) 98.4 F (36.9 C) (Oral)  Resp 16  SpO2 95%  She is alert, in no distress  Heart irregular  Lungs with some rhonchi  Abdomen: Bowel sounds present it is soft there is some discomfort in the lower abdomen but no rebound or guarding  Laboratory No components found with this basename: d1      Assessment:     #1 melena  #2 anemia  #3 left renal lesion  #4 diverticulosis with no evidence of diverticulitis      Plan:     The patient is being admitted to the hospital by Dr. Clelia Croft. I would recommend holding her Coumadin. I would recommend PPI therapy. We will watch for a hopeful drop in her PT and INR with a planned EGD in the next day or 2. I do not see a reason to do it emergently at this time. Recommend transfusion of blood products as needed.  Dr. Clelia Croft to direct consultation for further evaluation of left renal lesion.  Lab Results  Component Value Date   HGB 10.5* 07/21/2011   HGB 10.1* 07/21/2011   HGB 13.1 01/20/2008   HCT 31.0* 07/21/2011   HCT 29.6* 07/21/2011   HCT 37.4 01/20/2008   ALKPHOS 35* 07/21/2011   ALKPHOS 50 01/18/2008   AST 18 07/21/2011   AST 22 01/18/2008   ALT 20 07/21/2011   ALT 18 01/18/2008

## 2011-07-21 NOTE — ED Notes (Signed)
Nurse unable to take report.  Will call me back

## 2011-07-21 NOTE — Consult Note (Signed)
Urology Consult  Referring physician: Dr. Clelia Croft Cc: Dr. Isabel Caprice Reason for referral: incidental renal mass Chief Complaint: GI bleed, abdominal pain  History of Present Illness: 78 yo female admitted for evaluation of GI bleed with grossly bloody stool, abdominal pain, anemia, with emergency CT showing an incidental 1.8 x 1.9cm LLP mass, suspicious for RCC. Also not e hx of A. Fibrillation. No flank pain. No gross hematuria.    Past Medical History  Diagnosis Date  . Osteoporosis   . Atrial fibrillation   . Hypertension   . Hypothyroidism    Past Surgical History  Procedure Date  . Gallbladder surgery 1960  . Partial hysterectomy   . Cholecystectomy     Medications: I have reviewed the patient's current medications. Allergies:  Allergies  Allergen Reactions  . Levofloxacin Other (See Comments)    REACTION: unspecified    No family history on file. Social History:  reports that she has been smoking.  She does not have any smokeless tobacco history on file. She reports that she drinks alcohol. She reports that she does not use illicit drugs.  ROS: All systems are reviewed and negative except as noted. Recurrent UTI, treated in the past by Dr. Isabel Caprice.   Physical Exam:  Vital signs in last 24 hours: Temp:  [98 F (36.7 C)-98.4 F (36.9 C)] 98.2 F (36.8 C) (04/30 1747) Pulse Rate:  [63-108] 84  (04/30 1747) Resp:  [14-18] 18  (04/30 1747) BP: (64-136)/(44-85) 123/85 mmHg (04/30 1747) SpO2:  [93 %-97 %] 96 % (04/30 1747)  Cardiovascular: Skin warm; not flushed Respiratory: Breaths quiet; no shortness of breath Abdomen: No masses Neurological: Normal sensation to touch Musculoskeletal: Normal motor function arms and legs Lymphatics: No inguinal adenopathy Skin: No rashes Genitourinary:Normal BUS.   Laboratory Data:  Results for orders placed during the hospital encounter of 07/21/11 (from the past 72 hour(s))  CBC     Status: Abnormal   Collection Time   07/21/11  11:04 AM      Component Value Range Comment   WBC 11.8 (*) 4.0 - 10.5 (K/uL)    RBC 3.44 (*) 3.87 - 5.11 (MIL/uL)    Hemoglobin 10.1 (*) 12.0 - 15.0 (g/dL)    HCT 45.4 (*) 09.8 - 46.0 (%)    MCV 86.0  78.0 - 100.0 (fL)    MCH 29.4  26.0 - 34.0 (pg)    MCHC 34.1  30.0 - 36.0 (g/dL)    RDW 11.9  14.7 - 82.9 (%)    Platelets 271  150 - 400 (K/uL)   DIFFERENTIAL     Status: Normal   Collection Time   07/21/11 11:04 AM      Component Value Range Comment   Neutrophils Relative 58  43 - 77 (%)    Neutro Abs 6.9  1.7 - 7.7 (K/uL)    Lymphocytes Relative 34  12 - 46 (%)    Lymphs Abs 4.0  0.7 - 4.0 (K/uL)    Monocytes Relative 8  3 - 12 (%)    Monocytes Absolute 0.9  0.1 - 1.0 (K/uL)    Eosinophils Relative 0  0 - 5 (%)    Eosinophils Absolute 0.0  0.0 - 0.7 (K/uL)    Basophils Relative 0  0 - 1 (%)    Basophils Absolute 0.0  0.0 - 0.1 (K/uL)   COMPREHENSIVE METABOLIC PANEL     Status: Abnormal   Collection Time   07/21/11 11:04 AM      Component  Value Range Comment   Sodium 135  135 - 145 (mEq/L)    Potassium 3.6  3.5 - 5.1 (mEq/L)    Chloride 97  96 - 112 (mEq/L)    CO2 28  19 - 32 (mEq/L)    Glucose, Bld 120 (*) 70 - 99 (mg/dL)    BUN 53 (*) 6 - 23 (mg/dL)    Creatinine, Ser 1.61  0.50 - 1.10 (mg/dL)    Calcium 9.0  8.4 - 10.5 (mg/dL)    Total Protein 6.2  6.0 - 8.3 (g/dL)    Albumin 3.5  3.5 - 5.2 (g/dL)    AST 18  0 - 37 (U/L)    ALT 20  0 - 35 (U/L)    Alkaline Phosphatase 35 (*) 39 - 117 (U/L)    Total Bilirubin 0.3  0.3 - 1.2 (mg/dL)    GFR calc non Af Amer 80 (*) >90 (mL/min)    GFR calc Af Amer >90  >90 (mL/min)   PROTIME-INR     Status: Abnormal   Collection Time   07/21/11 11:04 AM      Component Value Range Comment   Prothrombin Time 25.1 (*) 11.6 - 15.2 (seconds)    INR 2.23 (*) 0.00 - 1.49    OCCULT BLOOD, POC DEVICE     Status: Normal   Collection Time   07/21/11 11:09 AM      Component Value Range Comment   Fecal Occult Bld POSITIVE     LACTIC ACID, PLASMA      Status: Normal   Collection Time   07/21/11 11:31 AM      Component Value Range Comment   Lactic Acid, Venous 1.8  0.5 - 2.2 (mmol/L)   POCT I-STAT, CHEM 8     Status: Abnormal   Collection Time   07/21/11 11:32 AM      Component Value Range Comment   Sodium 135  135 - 145 (mEq/L)    Potassium 3.6  3.5 - 5.1 (mEq/L)    Chloride 100  96 - 112 (mEq/L)    BUN 55 (*) 6 - 23 (mg/dL)    Creatinine, Ser 0.96  0.50 - 1.10 (mg/dL)    Glucose, Bld 045 (*) 70 - 99 (mg/dL)    Calcium, Ion 4.09  1.12 - 1.32 (mmol/L)    TCO2 28  0 - 100 (mmol/L)    Hemoglobin 10.5 (*) 12.0 - 15.0 (g/dL)    HCT 81.1 (*) 91.4 - 46.0 (%)   URINALYSIS, ROUTINE W REFLEX MICROSCOPIC     Status: Abnormal   Collection Time   07/21/11  2:32 PM      Component Value Range Comment   Color, Urine YELLOW  YELLOW     APPearance CLEAR  CLEAR     Specific Gravity, Urine 1.030  1.005 - 1.030     pH 6.0  5.0 - 8.0     Glucose, UA NEGATIVE  NEGATIVE (mg/dL)    Hgb urine dipstick LARGE (*) NEGATIVE     Bilirubin Urine NEGATIVE  NEGATIVE     Ketones, ur NEGATIVE  NEGATIVE (mg/dL)    Protein, ur NEGATIVE  NEGATIVE (mg/dL)    Urobilinogen, UA 0.2  0.0 - 1.0 (mg/dL)    Nitrite NEGATIVE  NEGATIVE     Leukocytes, UA MODERATE (*) NEGATIVE    URINE MICROSCOPIC-ADD ON     Status: Normal   Collection Time   07/21/11  2:32 PM      Component Value  Range Comment   Squamous Epithelial / LPF RARE  RARE     WBC, UA 7-10  <3 (WBC/hpf)    RBC / HPF 0-2  <3 (RBC/hpf) RESULT CHECKED.   Bacteria, UA RARE  RARE     No results found for this or any previous visit (from the past 240 hour(s)). Creatinine:  Basename 07/21/11 1132 07/21/11 1104  CREATININE 0.70 0.73    Xrays: *RADIOLOGY REPORT*  Clinical Data: Abdominal pain and rectal bleeding. Lower abdominal  pain.  CT ABDOMEN AND PELVIS WITH CONTRAST  Technique: Multidetector CT imaging of the abdomen and pelvis was  performed following the standard protocol during bolus    administration of intravenous contrast.  Contrast: OMNIPAQUE IOHEXOL 300 MG/ML SOLN  Comparison: CT of abdomen and pelvis 01/17/2008.  Findings:  Lung Bases: Unremarkable.  Abdomen/Pelvis: Study is slightly limited by extensive patient  respiratory motion.  Mild intrahepatic biliary ductal dilatation is unchanged compared  to the prior study from 01/17/2008. A subcentimeter low  attenuation lesion in the posterior aspect of segment 7 of the  liver is too small to characterize, but is unchanged compared to  the prior examination, and is presumably benign (likely a small  cyst or biliary hamartoma). No other focal hepatic lesions are  noted. The gallbladder is not visualized, and may be surgically  absent. The enhanced appearance of the pancreas, spleen and  bilateral adrenal glands is unremarkable.  In the anterior aspect of the interpolar region of the left kidney  there is a 1.8 x 1.9 cm enhancing lesion which has increased in  size compared to the prior study, and is highly suspicious for a  small renal neoplasm. Multiple additional low attenuation lesions  are noted in the kidneys bilaterally, many of which are  subcentimeter in size and too small to definitively characterize.  The largest low attenuation renal lesion is in the right kidney  measuring up to 2.0 cm in diameter, and is compatible with a cyst.  Dystrophic calcification in the periphery of the interpolar region  of the right kidney is similar to the prior study.  There are numerous colonic diverticula, most pronounced in the  region of the sigmoid colon. At this time, there does not appear  to be significant surrounding inflammatory changes to suggest an  acute diverticulitis. There is no ascites or pneumoperitoneum and  no definite pathologic distension of bowel. No definite pathologic  adenopathy identified within the abdomen or pelvis. There is  extensive atherosclerosis throughout the abdominal and pelvic   vasculature, without evidence of aneurysm or dissection.  Musculoskeletal: Status post ORIF of left femoral neck. There are  no aggressive appearing lytic or blastic lesions noted in the  visualized portions of the skeleton.  IMPRESSION:  1. Mild colonic diverticulosis of the sigmoid colon, without  surrounding inflammatory changes to suggest acute diverticulitis.  This may explain the patient's history of rectal bleeding.  2. In addition, there is a large duodenal diverticulum from the  second portion of the duodenum. Typically, these are asymptomatic,  but may occasionally be a source of GI bleeding, and sometimes are  associated with bacterial overgrowth which can result in  hypermetabolism of vitamin B12 and presentation with macrocytic  anemia. Clinical correlation is recommended.  3. 1.8 x 1.9 cm enhancing left renal lesion is highly suspicious  for a solid neoplasm such as a renal cell carcinoma. Urology  consultation is recommended.  These results were called by telephone on 07/21/2011  Impression/Assessment:  Incidental LLP solid mass, 1.8 x 1.9cm . I have discussed this with the patient. It is not related to her abdominal pain, or her GI bleed, or her anemia, or atrial fibrillation. She will eventually need follow-up for the lesion, and probably CT with IV contrast to evaluate the lesion for enhancement to see if it is a tumor. Options for treatment would be to follow the lesion to see if it would grow ( she is 80, and lesion is asymptomatic), or possibly freeze the lesion if it grows or becomes problematic. ( via percutaneous or laparascopic route) Plan:  No further Rx needed this hospitalization. Will notify Dr. Isabel Caprice in AM  Jethro Bolus I 07/21/2011, 8:28 PM

## 2011-07-21 NOTE — ED Notes (Signed)
[id number] Ready 

## 2011-07-21 NOTE — ED Notes (Signed)
Pt is here with black stools since yesterday.    Pt is hurting in lower abdomen with cramping.  Pt reports lower back pain.  Pt is on coumadin

## 2011-07-21 NOTE — ED Notes (Signed)
Fluids started for pt

## 2011-07-21 NOTE — H&P (Signed)
PCP:   Kari Baars, MD, MD   Chief Complaint:  Black stools  HPI: Natalie [name] is a 78 year old white female with a history of atrial fibrillation on chronic anticoagulation, hypertension, and chronic back and abdominal pain he presented to the emergency department with the complaint of black stools. Patient states that she has been her usual state of health with her ongoing back and abdominal pain recently. Yesterday morning she started having dark tarry stools, the color of licorice. These continued throughout the night and into the morning. It is difficult to quantify but it sounds like she had at least 3-4 bowel movements. She may have had a slight increase in her lower abdominal pain as well as some upper abdominal pain, her history is inconsistent. She is chronically anticoagulated on Coumadin and so when she called with these symptoms she was referred to the emergency department where she was found to be heme positive with a hemoglobin of 10.1 and a BUN of 55. Her INR is 2.23.  She underwent a CT of the abdomen and pelvis that showed a large duodenal diverticulum and sigmoid diverticulosis. Incidentally noted was a worrisome left renal mass.  She's had no further bleeding in the emergency department. She has received some morphine and fentanyl for her pain. She had a transient episode of hypotension which is resolved with IV fluid hydration and was felt to be related to the narcotics. Given these issues she is being admitted for further management  Review of Systems:  Review of Systems - all systems were reviewed with the patient and are negative except in history of present illness the following exceptions: She does have chronic sensitivity to light and wears sunglasses most of the time. Intermittent headaches. Chronic back pain for which she is seen orthopedics and is tried physical therapy without relief. Recurrent urinary tract infections followed by Dr. Isabel Caprice in urology.  Past Medical  History:  Past Medical History (reviewed - no changes required): Afib on anticoagulation HTN with white coat component Hypothyroid Osteoporosis Shingles, L shoulder IBS? L adrenal mass Trigeminal neuralgia Abdominal pain Anxiety Generalized weakness Hyponatremia/Hypokalemia Diverticulitis Chronic pain Depression Chronic cystitis Microscopic hematuria  Surgical History (reviewed - no changes required): L hip fx Chole s/p BSO/TAH Cataract surgery   Medications: Prior to Admission medications   Medication Sig Start Date End Date Taking? Authorizing Provider  amLODipine (NORVASC) 5 MG tablet Take 2.5 mg by mouth daily.     Yes Historical Provider, MD  aspirin 81 MG chewable tablet Chew 81 mg by mouth daily.     Yes Historical Provider, MD  benazepril (LOTENSIN) 40 MG tablet Take 40 mg by mouth daily.     Yes Historical Provider, MD  calcium citrate (CALCITRATE - DOSED IN MG ELEMENTAL CALCIUM) 950 MG tablet Take 1 tablet by mouth 2 (two) times daily.     Yes Historical Provider, MD  carvedilol (COREG) 12.5 MG tablet Take 12.5 mg by mouth 2 (two) times daily with a meal.     Yes Historical Provider, MD  dicyclomine (BENTYL) 10 MG capsule Take 10 mg by mouth 4 (four) times daily -  before meals and at bedtime.     Yes Historical Provider, MD  HYDROcodone-acetaminophen (VICODIN) 5-500 MG per tablet Take 1 tablet by mouth daily as needed.    Yes Historical Provider, MD  levothyroxine (SYNTHROID, LEVOTHROID) 112 MCG tablet Take 56 mcg by mouth daily.    Yes Historical Provider, MD  LORazepam (ATIVAN) 1 MG tablet Take 1  mg by mouth 4 (four) times daily as needed. Anxiety.   Yes Historical Provider, MD  warfarin (COUMADIN) 2.5 MG tablet Take 2.5 mg by mouth daily.     Yes Historical Provider, MD    Allergies:   Allergies  Allergen Reactions  . Levofloxacin Other (See Comments)    REACTION: unspecified    Family History (reviewed - no changes required): Father deceased at  41. Mother deceased at 4 (CVA). GM deceased in 68s. Brother w/ CAD s/p stent. Daughter healthy, hyperlipidemia. Daughter healthy, mental illness. Son healthy.  Social History (reviewed - no changes required): Widow x16 yrs, 3 children, 3 grandsons (2 in [place], 1 in [place]).  Natalie [name] is HCPOA.  Daughter lives with her   2 yrs college. Previously worked w/ daughter at [employer]. Closet smoker (5-6 cigarettes q day x40 yrs); quit smoking ~2010.  ETOH socially.  Natalie [name]'s cell phone number is [phone]  Physical Exam: Filed Vitals:   07/21/11 1313 07/21/11 1330 07/21/11 1500 07/21/11 1530  BP: 79/59 114/73 120/74 128/78  Pulse: 63 87 98 98  Temp:      TempSrc:      Resp:      SpO2: 94% 93% 96% 95%   General appearance: alert and no distress Head: Normocephalic, without obvious abnormality, atraumatic Eyes: conjunctivae/corneas clear. PERRL, EOM's intact.  Nose: Nares normal. Septum midline. Mucosa normal. No drainage or sinus tenderness. Throat: lips, mucosa, and tongue normal; teeth and gums normal Neck: no adenopathy, no carotid bruit, no JVD and thyroid not enlarged, symmetric, no tenderness/mass/nodules Resp: clear to auscultation bilaterally Cardio: irregularly irregular rhythm GI: soft, mildly distended with diffuse tenderness; bowel sounds normal; no masses,  no organomegaly Extremities: extremities normal, atraumatic, no cyanosis or edema Pulses: 2+ and symmetric Lymph nodes: Cervical adenopathy: no cervical lymphadenopathy Neurologic: Alert and oriented X 3, normal strength and tone. Normal symmetric reflexes.     Labs on Admission:   [facility] 07/21/11 1132 07/21/11 1104  NA 135 135  K 3.6 3.6  CL 100 97  CO2 -- 28  GLUCOSE 115* 120*  BUN 55* 53*  CREATININE 0.70 0.73  CALCIUM -- 9.0  MG -- --  PHOS -- --    Basename 07/21/11 1104  AST 18  ALT 20  ALKPHOS 35*  BILITOT 0.3  PROT 6.2  ALBUMIN 3.5   No results found for this basename: LIPASE:2,AMYLASE:2 in  the last 72 hours  Basename 07/21/11 1132 07/21/11 1104  WBC -- 11.8*  NEUTROABS -- 6.9  HGB 10.5* 10.1*  HCT 31.0* 29.6*  MCV -- 86.0  PLT -- 271   No results found for this basename: CKTOTAL:3,CKMB:3,CKMBINDEX:3,TROPONINI:3 in the last 72 hours Lab Results  Component Value Date   INR 2.23* 07/21/2011   No results found for this basename: TSH,T4TOTAL,FREET3,T3FREE,THYROIDAB in the last 72 hours No results found for this basename: VITAMINB12:2,FOLATE:2,FERRITIN:2,TIBC:2,IRON:2,RETICCTPCT:2 in the last 72 hours  Radiological Exams on Admission: Ct Abdomen Pelvis W Contrast  07/21/2011  *RADIOLOGY REPORT*  Clinical Data: Abdominal pain and rectal bleeding.  Lower abdominal pain.  CT ABDOMEN AND PELVIS WITH CONTRAST  Technique:  Multidetector CT imaging of the abdomen and pelvis was performed following the standard protocol during bolus administration of intravenous contrast.  Contrast: OMNIPAQUE IOHEXOL 300 MG/ML  SOLN  Comparison: CT of abdomen and pelvis 01/17/2008.  Findings:  Lung Bases: Unremarkable.  Abdomen/Pelvis:  Study is slightly limited by extensive patient respiratory motion.  Mild intrahepatic biliary ductal dilatation is unchanged compared to the prior  study from 01/17/2008.  A subcentimeter low attenuation lesion in the posterior aspect of segment 7 of the liver is too small to characterize, but is unchanged compared to the prior examination, and is presumably benign (likely a small cyst or biliary hamartoma).  No other focal hepatic lesions are noted.  The gallbladder is not visualized, and may be surgically absent.  The enhanced appearance of the pancreas, spleen and bilateral adrenal glands is unremarkable.  In the anterior aspect of the interpolar region of the left kidney there is a 1.8 x 1.9 cm enhancing lesion which has increased in size compared to the prior study, and is highly suspicious for a small renal neoplasm.  Multiple additional low attenuation lesions are  noted in the kidneys bilaterally, many of which are subcentimeter in size and too small to definitively characterize. The largest low attenuation renal lesion is in the right kidney measuring up to 2.0 cm in diameter, and is compatible with a cyst. Dystrophic calcification in the periphery of the interpolar region of the right kidney is similar to the prior study.  There are numerous colonic diverticula, most pronounced in the region of the sigmoid colon.  At this time, there does not appear to be significant surrounding inflammatory changes to suggest an acute diverticulitis.  There is no ascites or pneumoperitoneum and no definite pathologic distension of bowel.  No definite pathologic adenopathy identified within the abdomen or pelvis.  There is extensive atherosclerosis throughout the abdominal and pelvic vasculature, without evidence of aneurysm or dissection.  Musculoskeletal: Status post ORIF of left femoral neck. There are no aggressive appearing lytic or blastic lesions noted in the visualized portions of the skeleton.  IMPRESSION: 1.  Mild colonic diverticulosis of the sigmoid colon, without surrounding inflammatory changes to suggest acute diverticulitis. This may explain the patient's history of rectal bleeding. 2.  In addition, there is a large duodenal diverticulum from the second portion of the duodenum.  Typically, these are asymptomatic, but may occasionally be a source of GI bleeding, and sometimes are associated with bacterial overgrowth which can result in hypermetabolism of vitamin B12 and presentation with macrocytic anemia.  Clinical correlation is recommended. 3.  1.8 x 1.9 cm enhancing left renal lesion is highly suspicious for a solid neoplasm such as a renal cell carcinoma. Urology consultation is recommended.  These results were called by telephone on 07/21/2011  at  02:40 p.m. to  PA, Ruby Cola, who verbally acknowledged these results.  Original Report Authenticated By: Florencia Reasons, M.D.   No orders found for this or any previous visit.  Assessment/Plan Active Problems: 1. Acute GI bleeding- history is most consistent with an upper GI bleed given the melanic stools and elevated BUN. Gastroenterology has been consulted and is considering an endoscopy once INR is lower. We'll monitor hemoglobin with serial H&H's and transfuse if hemoglobin drops below 8.0. Will hold Coumadin and monitor INRs. If she does have a more acute bleed we'll need to reverse her coagulopathy with vitamin K.  We'll start Protonix IV twice a day. 2. Abdominal pain- chronic abdominal pain seems to be a little worse recently. This may be related to peptic ulcer disease. No evidence of diverticulitis on scan. 3. Renal mass, left- CT is worrisome for renal cell cancer. I discussed this with her daughter and the patient. She is very upset that this has not been discovered previously. It is possible that she has had imaging at [facility] as part of her evaluation for  chronic cystitis in the past. Urology will see her in the morning 4. Chronic back pain- I do not think this is related to her renal mass. I've reiterated this to her daughter. We'll continue narcotics as needed for now. Avoid NSAIDs which she has not been taking recently. 5. Recurrent UTI-will treat with Rocephin and obtain a urine culture.   6. Atrial fibrillation- we'll continue Coreg for rate control. Hold Coumadin due to her active GI bleeding. Will consider resuming once bleeding has resolved. I did discuss the low risk of CVA with discontinuation of Coumadin but I believe the risk of continuing Coumadin exceeds the benefit at this point. 7. Warfarin anticoagulation-as above 8. ANXIETY DISORDER- continue Ativan as needed 9. HYPERTENSION- we'll hold antihypertensives except for coronary and resume as tolerated.   Lavaughn Haberle,W DOUGLAS 07/21/2011, 5:09 PM

## 2011-07-21 NOTE — ED Notes (Signed)
EDP notified. 

## 2011-07-21 NOTE — ED Provider Notes (Signed)
History     CSN: [id number]  Arrival date & time 07/21/11  1020   First MD Initiated Contact with Patient 07/21/11 1035      No chief complaint on file.   (Consider location/radiation/quality/duration/timing/severity/associated sxs/prior treatment) HPI History provided by pt.   Pt has had diffuse lower abd pain w/ radiation to back x several days.  Pain worst at LLQ.  Can not describe sensation and is unsure if she has had similar pain in the past.  Associated w/ 3-4 black stools since yesterday.  Denies fever, urinary sx, N/V/D, rectal pain.  Has not had lightheadedness or worse than baseline SOB or fatigue.  Pt is anti-coagulated w/ coumadin and is compliant w/ medications.  No h/o GI bleed.  H/o diverticulosis and IBS.  Most recent colonoscopy approx 72yrs ago.  Past Medical History  Diagnosis Date  . Osteoporosis   . Atrial fibrillation   . Hypertension   . Hypothyroidism     Past Surgical History  Procedure Date  . Gallbladder surgery 1960  . Partial hysterectomy   . Cholecystectomy     No family history on file.  History  Substance Use Topics  . Smoking status: Current Some Day Smoker  . Smokeless tobacco: Not on file  . Alcohol Use: Yes     seldom    OB History    Grav Para Term Preterm Abortions TAB SAB Ect Mult Living                  Review of Systems  All other systems reviewed and are negative.    Allergies  Levofloxacin  Home Medications   Current Outpatient Rx  Name Route Sig Dispense Refill  . AMLODIPINE BESYLATE 5 MG PO TABS Oral Take 2.5 mg by mouth daily.      . ASPIRIN 81 MG PO CHEW Oral Chew 81 mg by mouth daily.      Marland Kitchen BENAZEPRIL HCL 40 MG PO TABS Oral Take 40 mg by mouth daily.      Marland Kitchen CALCIUM CITRATE 950 MG PO TABS Oral Take 1 tablet by mouth 2 (two) times daily.      Marland Kitchen CARVEDILOL 12.5 MG PO TABS Oral Take 12.5 mg by mouth 2 (two) times daily with a meal.      . DICYCLOMINE HCL 10 MG PO CAPS Oral Take 10 mg by mouth 4 (four) times  daily -  before meals and at bedtime.      Marland Kitchen HYDROCODONE-ACETAMINOPHEN 5-500 MG PO TABS Oral Take 1 tablet by mouth daily as needed.     Marland Kitchen LEVOTHYROXINE SODIUM 112 MCG PO TABS Oral Take 56 mcg by mouth daily.     Marland Kitchen LORAZEPAM 1 MG PO TABS Oral Take 1 mg by mouth 4 (four) times daily as needed. Anxiety.    . WARFARIN SODIUM 2.5 MG PO TABS Oral Take 2.5 mg by mouth daily.        BP 123/79  Pulse 108  Temp 98 F (36.7 C)  Resp 14  SpO2 97%  Physical Exam  Nursing note and vitals reviewed. Constitutional: She is oriented to person, place, and time. She appears well-developed and well-nourished. No distress.  HENT:  Head: Normocephalic and atraumatic.  Eyes:       Normal appearance  Neck: Normal range of motion.  Cardiovascular: Normal rate and regular rhythm.   Pulmonary/Chest: Effort normal and breath sounds normal. No respiratory distress.  Abdominal: Soft. Bowel sounds are normal. She exhibits no distension  and no mass. There is no rebound and no guarding.       DIffuse, mild-mod tenderness lower abd.  Pt reports that it is worst at RLQ.    Genitourinary:       No external hemorrhoids.  Black, watery stool in rectum.  Rectum non-tender and nml tone.  No CVA tenderness.  Musculoskeletal: Normal range of motion.  Neurological: She is alert and oriented to person, place, and time.  Skin: Skin is warm and dry. No rash noted.  Psychiatric: She has a normal mood and affect. Her behavior is normal.    ED Course  Procedures (including critical care time)  Labs Reviewed  CBC - Abnormal; Notable for the following:    WBC 11.8 (*)    RBC 3.44 (*)    Hemoglobin 10.1 (*)    HCT 29.6 (*)    All other components within normal limits  COMPREHENSIVE METABOLIC PANEL - Abnormal; Notable for the following:    Glucose, Bld 120 (*)    BUN 53 (*)    Alkaline Phosphatase 35 (*)    GFR calc non Af Amer 80 (*)    All other components within normal limits  PROTIME-INR - Abnormal; Notable for the  following:    Prothrombin Time 25.1 (*)    INR 2.23 (*)    All other components within normal limits  POCT I-STAT, CHEM 8 - Abnormal; Notable for the following:    BUN 55 (*)    Glucose, Bld 115 (*)    Hemoglobin 10.5 (*)    HCT 31.0 (*)    All other components within normal limits  URINALYSIS, ROUTINE W REFLEX MICROSCOPIC - Abnormal; Notable for the following:    Hgb urine dipstick LARGE (*)    Leukocytes, UA MODERATE (*)    All other components within normal limits  DIFFERENTIAL  OCCULT BLOOD, POC DEVICE  LACTIC ACID, PLASMA  URINE MICROSCOPIC-ADD ON   Ct Abdomen Pelvis W Contrast  07/21/2011  *RADIOLOGY REPORT*  Clinical Data: Abdominal pain and rectal bleeding.  Lower abdominal pain.  CT ABDOMEN AND PELVIS WITH CONTRAST  Technique:  Multidetector CT imaging of the abdomen and pelvis was performed following the standard protocol during bolus administration of intravenous contrast.  Contrast: OMNIPAQUE IOHEXOL 300 MG/ML  SOLN  Comparison: CT of abdomen and pelvis 01/17/2008.  Findings:  Lung Bases: Unremarkable.  Abdomen/Pelvis:  Study is slightly limited by extensive patient respiratory motion.  Mild intrahepatic biliary ductal dilatation is unchanged compared to the prior study from 01/17/2008.  A subcentimeter low attenuation lesion in the posterior aspect of segment 7 of the liver is too small to characterize, but is unchanged compared to the prior examination, and is presumably benign (likely a small cyst or biliary hamartoma).  No other focal hepatic lesions are noted.  The gallbladder is not visualized, and may be surgically absent.  The enhanced appearance of the pancreas, spleen and bilateral adrenal glands is unremarkable.  In the anterior aspect of the interpolar region of the left kidney there is a 1.8 x 1.9 cm enhancing lesion which has increased in size compared to the prior study, and is highly suspicious for a small renal neoplasm.  Multiple additional low attenuation  lesions are noted in the kidneys bilaterally, many of which are subcentimeter in size and too small to definitively characterize. The largest low attenuation renal lesion is in the right kidney measuring up to 2.0 cm in diameter, and is compatible with a cyst. Dystrophic  calcification in the periphery of the interpolar region of the right kidney is similar to the prior study.  There are numerous colonic diverticula, most pronounced in the region of the sigmoid colon.  At this time, there does not appear to be significant surrounding inflammatory changes to suggest an acute diverticulitis.  There is no ascites or pneumoperitoneum and no definite pathologic distension of bowel.  No definite pathologic adenopathy identified within the abdomen or pelvis.  There is extensive atherosclerosis throughout the abdominal and pelvic vasculature, without evidence of aneurysm or dissection.  Musculoskeletal: Status post ORIF of left femoral neck. There are no aggressive appearing lytic or blastic lesions noted in the visualized portions of the skeleton.  IMPRESSION: 1.  Mild colonic diverticulosis of the sigmoid colon, without surrounding inflammatory changes to suggest acute diverticulitis. This may explain the patient's history of rectal bleeding. 2.  In addition, there is a large duodenal diverticulum from the second portion of the duodenum.  Typically, these are asymptomatic, but may occasionally be a source of GI bleeding, and sometimes are associated with bacterial overgrowth which can result in hypermetabolism of vitamin B12 and presentation with macrocytic anemia.  Clinical correlation is recommended. 3.  1.8 x 1.9 cm enhancing left renal lesion is highly suspicious for a solid neoplasm such as a renal cell carcinoma. Urology consultation is recommended.  These results were called by telephone on 07/21/2011  at  02:40 p.m. to  PA, Ruby Cola, who verbally acknowledged these results.  Original Report  Authenticated By: Florencia Reasons, M.D.     1. GI bleed   2. Renal lesion       MDM  Anti-coagulated pt presents w/ c/o black stools and diffuse lower abd pain.  Otherwise Asx.  On exam, NAD, heart w/ RRR, lungs clear, lower abd diffusely tender, black stool in rectal vault.  Labs sig for positive hemoccult, hgb 10.1 (no recent prior for comparison), INR 2.23, nml lactate.  U/A pending.  CT abd/pelvis as been ordered.  Pt received 2mg  IV morphine w/out relief.  BP rechecked and stable and 4mg  dose ordered.  12:19 PM    Patient's BP dropped w/ morphine and fluid bolus ordered.  Most recent BP 96/54.  U/A pos for infection.  IV rocephin ordered.  CT shows sigmoid diverticulosis as well as large duodenal diverticulum; other of which could be source of bleeding.  Also shows left renal lesion concerning for renal cell carcinoma.  All results discussed w/ pt and her daughter.  Dr. Clelia Croft w/ [facility] consulted for admission and [facility] has been made aware of pt as well.        Otilio Miu, [place] 07/21/11 [phone]

## 2011-07-22 ENCOUNTER — Encounter (HOSPITAL_COMMUNITY): Payer: Self-pay | Admitting: General Practice

## 2011-07-22 DIAGNOSIS — D62 Acute posthemorrhagic anemia: Secondary | ICD-10-CM | POA: Diagnosis present

## 2011-07-22 LAB — ABO/RH: ABO/RH(D): B POS

## 2011-07-22 LAB — BASIC METABOLIC PANEL
BUN: 25 mg/dL — ABNORMAL HIGH (ref 6–23)
CO2: 27 mEq/L (ref 19–32)
Chloride: 95 mEq/L — ABNORMAL LOW (ref 96–112)
Creatinine, Ser: 0.54 mg/dL (ref 0.50–1.10)
Glucose, Bld: 113 mg/dL — ABNORMAL HIGH (ref 70–99)

## 2011-07-22 LAB — CBC
HCT: 23.6 % — ABNORMAL LOW (ref 36.0–46.0)
Hemoglobin: 7.9 g/dL — ABNORMAL LOW (ref 12.0–15.0)
MCHC: 33.5 g/dL (ref 30.0–36.0)
MCV: 88.1 fL (ref 78.0–100.0)
RDW: 13.3 % (ref 11.5–15.5)

## 2011-07-22 LAB — URINE CULTURE
Colony Count: NO GROWTH
Culture  Setup Time: 201304302111

## 2011-07-22 MED ORDER — DIPHENHYDRAMINE HCL 25 MG PO CAPS
25.0000 mg | ORAL_CAPSULE | Freq: Once | ORAL | Status: AC
  Administered 2011-07-22: 25 mg via ORAL
  Filled 2011-07-22: qty 1

## 2011-07-22 MED ORDER — FUROSEMIDE 10 MG/ML IJ SOLN
20.0000 mg | Freq: Once | INTRAMUSCULAR | Status: AC
  Administered 2011-07-22: 20 mg via INTRAVENOUS
  Filled 2011-07-22: qty 2

## 2011-07-22 MED ORDER — VITAMIN K1 10 MG/ML IJ SOLN
5.0000 mg | Freq: Once | INTRAVENOUS | Status: AC
  Administered 2011-07-22: 5 mg via INTRAVENOUS
  Filled 2011-07-22: qty 0.5

## 2011-07-22 MED ORDER — ACETAMINOPHEN 325 MG PO TABS
650.0000 mg | ORAL_TABLET | Freq: Once | ORAL | Status: AC
  Administered 2011-07-22: 650 mg via ORAL

## 2011-07-22 NOTE — ED Provider Notes (Signed)
Medical screening examination/treatment/procedure(s) were conducted as a shared visit with non-physician practitioner(s) and myself.  I personally evaluated the patient during the encounter Pt with abd pain and dark stool.  Significant pain on exam.  CT with renal carcinoma and diverticulosis which is most likely the cause of pain.  Gwyneth Sprout, MD 07/22/11 2054

## 2011-07-22 NOTE — Progress Notes (Signed)
Subjective: Had 1 dark black BM this am.  Feels weak and a little tremulous.  No nausea or vomitting.  Pain controlled.    Objective: Vital signs in last 24 hours: Temp:  [97.7 F (36.5 C)-98.4 F (36.9 C)] 97.7 F (36.5 C) (05/01 0406) Pulse Rate:  [63-108] 91  (05/01 0406) Resp:  [14-18] 18  (05/01 0406) BP: (64-137)/(44-86) 137/86 mmHg (05/01 0406) SpO2:  [93 %-100 %] 100 % (05/01 0406) Weight:  [64.411 kg (142 lb)] 64.411 kg (142 lb) (04/30 2208) Weight change:  Last BM Date: 07/21/11  CBG (last 3)  No results found for this basename: GLUCAP:3 in the last 72 hours  Intake/Output from previous day: 04/30 0701 - 05/01 0700 In: 858.8 [P.O.:240; I.V.:618.8] Out: -  Intake/Output this shift:    General appearance: alert and no distress Eyes: no scleral icterus Throat: oropharynx moist without erythema Resp: clear to auscultation bilaterally Cardio: irregularly irregular rhythm3 GI: soft, non-tender; bowel sounds normal; no masses,  no organomegaly Extremities: no clubbing, cyanosis or edema   Lab Results:  Basename 07/22/11 0329 07/21/11 1132 07/21/11 1104  NA 128* 135 --  K 4.2 3.6 --  CL 95* 100 --  CO2 27 -- 28  GLUCOSE 113* 115* --  BUN 25* 55* --  CREATININE 0.54 0.70 --  CALCIUM 7.9* -- 9.0  MG -- -- --  PHOS -- -- --    Basename 07/21/11 1104  AST 18  ALT 20  ALKPHOS 35*  BILITOT 0.3  PROT 6.2  ALBUMIN 3.5    Basename 07/22/11 0329 07/21/11 2207 07/21/11 1104  WBC 9.7 -- 11.8*  NEUTROABS -- -- 6.9  HGB 7.9* 8.4* --  HCT 23.6* 24.5* --  MCV 88.1 -- 86.0  PLT 218 -- 271   Lab Results  Component Value Date   INR 2.77* 07/22/2011   INR 2.23* 07/21/2011    Studies/Results: Ct Abdomen Pelvis W Contrast  07/21/2011  *RADIOLOGY REPORT*  Clinical Data: Abdominal pain and rectal bleeding.  Lower abdominal pain.  CT ABDOMEN AND PELVIS WITH CONTRAST  Technique:  Multidetector CT imaging of the abdomen and pelvis was performed following the standard  protocol during bolus administration of intravenous contrast.  Contrast: OMNIPAQUE IOHEXOL 300 MG/ML  SOLN  Comparison: CT of abdomen and pelvis 01/17/2008.  Findings:  Lung Bases: Unremarkable.  Abdomen/Pelvis:  Study is slightly limited by extensive patient respiratory motion.  Mild intrahepatic biliary ductal dilatation is unchanged compared to the prior study from 01/17/2008.  A subcentimeter low attenuation lesion in the posterior aspect of segment 7 of the liver is too small to characterize, but is unchanged compared to the prior examination, and is presumably benign (likely a small cyst or biliary hamartoma).  No other focal hepatic lesions are noted.  The gallbladder is not visualized, and may be surgically absent.  The enhanced appearance of the pancreas, spleen and bilateral adrenal glands is unremarkable.  In the anterior aspect of the interpolar region of the left kidney there is a 1.8 x 1.9 cm enhancing lesion which has increased in size compared to the prior study, and is highly suspicious for a small renal neoplasm.  Multiple additional low attenuation lesions are noted in the kidneys bilaterally, many of which are subcentimeter in size and too small to definitively characterize. The largest low attenuation renal lesion is in the right kidney measuring up to 2.0 cm in diameter, and is compatible with a cyst. Dystrophic calcification in the periphery of the interpolar  region of the right kidney is similar to the prior study.  There are numerous colonic diverticula, most pronounced in the region of the sigmoid colon.  At this time, there does not appear to be significant surrounding inflammatory changes to suggest an acute diverticulitis.  There is no ascites or pneumoperitoneum and no definite pathologic distension of bowel.  No definite pathologic adenopathy identified within the abdomen or pelvis.  There is extensive atherosclerosis throughout the abdominal and pelvic vasculature, without  evidence of aneurysm or dissection.  Musculoskeletal: Status post ORIF of left femoral neck. There are no aggressive appearing lytic or blastic lesions noted in the visualized portions of the skeleton.  IMPRESSION: 1.  Mild colonic diverticulosis of the sigmoid colon, without surrounding inflammatory changes to suggest acute diverticulitis. This may explain the patient's history of rectal bleeding. 2.  In addition, there is a large duodenal diverticulum from the second portion of the duodenum.  Typically, these are asymptomatic, but may occasionally be a source of GI bleeding, and sometimes are associated with bacterial overgrowth which can result in hypermetabolism of vitamin B12 and presentation with macrocytic anemia.  Clinical correlation is recommended. 3.  1.8 x 1.9 cm enhancing left renal lesion is highly suspicious for a solid neoplasm such as a renal cell carcinoma. Urology consultation is recommended.  These results were called by telephone on 07/21/2011  at  02:40 p.m. to  PA, Ruby Cola, who verbally acknowledged these results.  Original Report Authenticated By: Florencia Reasons, M.D.     Medications: Scheduled:   . carvedilol  12.5 mg Oral BID WC  . cefTRIAXone (ROCEPHIN)  IV  1 g Intravenous Q24H  . fentaNYL  50 mcg Intravenous Once  . fentaNYL  50 mcg Intravenous Once  . iohexol  20 mL Oral Q1 Hr x 2  . levothyroxine  56 mcg Oral QAC breakfast  .  morphine injection  2 mg Intravenous Once  .  morphine injection  4 mg Intravenous Once  . pantoprazole (PROTONIX) IV  80 mg Intravenous Once  . pantoprazole (PROTONIX) IV  40 mg Intravenous Q12H  . sodium chloride  3 mL Intravenous Q12H  . DISCONTD: cefTRIAXone (ROCEPHIN)  IV  1 g Intravenous Once   Continuous:   . sodium chloride 75 mL/hr at 07/22/11 0700    Assessment/Plan: Active Problems:  1. Acute GI bleeding- significant decrease in hemoglobin with additional melanic stool this am.  Will reverse coagulopathy and  transfuse PRBCs.  Continue to monitor Hg.  Appreciate GI assistance.  Anticipate EGD once INR drops (hopefully tomorrow). 2. Acute Blood Loss Anemia- will transfuse 2 units PRBCs today due to symptomatic anemia and acute decline in Hg (10.5-->7.9). 3. Abdominal pain- seems improved this am with Dilaudid- some pain is chronic. 4. Renal mass, left- CT is worrisome for renal cell cancer. Appreciate Urology evaluation- will plan for outpatient evaluation. 5. Chronic back pain- I do not think this is related to her renal mass. I've reiterated this to her daughter. We'll continue narcotics as needed for now. Avoid NSAIDs which she has not been taking recently.  6. Recurrent UTI-will treat with Rocephin pending urine culture.  7. Atrial fibrillation- we'll continue Coreg for rate control. Hold Coumadin due to her active GI bleeding. Will consider resuming once bleeding has resolved. I did discuss the low risk of CVA with discontinuation of Coumadin but I believe the risk of continuing Coumadin exceeds the benefit at this point.  8. Warfarin anticoagulation-will reverse with Vitamin K  given ongoing bleeding and increased INR with antibiotics.  9. ANXIETY DISORDER- continue Ativan as needed  10. HYPERTENSION-BP stable- will hold antihypertensives except for coreg and resume as tolerated.    LOS: 1 day   Elijah Michaelis,W DOUGLAS 07/22/2011, 7:16 AM

## 2011-07-22 NOTE — Progress Notes (Signed)
[facility] Progress Note  Subjective: The patient reports that she is feeling somewhat better today although still weak. She had a black stool today.  Objective: Vital signs in last 24 hours: Temp:  [96.9 F (36.1 C)-98.4 F (36.9 C)] 96.9 F (36.1 C) (05/01 0900) Pulse Rate:  [63-98] 95  (05/01 0900) Resp:  [16-18] 18  (05/01 0900) BP: (64-137)/(44-86) 129/81 mmHg (05/01 0900) SpO2:  [93 %-100 %] 100 % (05/01 0900) Weight:  [64.411 kg (142 lb)] 64.411 kg (142 lb) (04/30 2208) Weight change:    PE: She is in no distress  Heart regular  Lungs clear  Abdomen soft nontender  Lab Results: Results for orders placed during the hospital encounter of 07/21/11 (from the past 24 hour(s))  URINALYSIS, ROUTINE W REFLEX MICROSCOPIC     Status: Abnormal   Collection Time   07/21/11  2:32 PM      Component Value Range   Color, Urine YELLOW  YELLOW    APPearance CLEAR  CLEAR    Specific Gravity, Urine 1.030  1.005 - 1.030    pH 6.0  5.0 - 8.0    Glucose, UA NEGATIVE  NEGATIVE (mg/dL)   Hgb urine dipstick LARGE (*) NEGATIVE    Bilirubin Urine NEGATIVE  NEGATIVE    Ketones, ur NEGATIVE  NEGATIVE (mg/dL)   Protein, ur NEGATIVE  NEGATIVE (mg/dL)   Urobilinogen, UA 0.2  0.0 - 1.0 (mg/dL)   Nitrite NEGATIVE  NEGATIVE    Leukocytes, UA MODERATE (*) NEGATIVE   URINE MICROSCOPIC-ADD ON     Status: Normal   Collection Time   07/21/11  2:32 PM      Component Value Range   Squamous Epithelial / LPF RARE  RARE    WBC, UA 7-10  <3 (WBC/hpf)   RBC / HPF 0-2  <3 (RBC/hpf)   Bacteria, UA RARE  RARE   HEMOGLOBIN AND HEMATOCRIT, BLOOD     Status: Abnormal   Collection Time   07/21/11 10:07 PM      Component Value Range   Hemoglobin 8.4 (*) 12.0 - 15.0 (g/dL)   HCT 16.1 (*) 09.6 - 46.0 (%)  BASIC METABOLIC PANEL     Status: Abnormal   Collection Time   07/22/11  3:29 AM      Component Value Range   Sodium 128 (*) 135 - 145 (mEq/L)   Potassium 4.2  3.5 - 5.1 (mEq/L)   Chloride 95  (*) 96 - 112 (mEq/L)   CO2 27  19 - 32 (mEq/L)   Glucose, Bld 113 (*) 70 - 99 (mg/dL)   BUN 25 (*) 6 - 23 (mg/dL)   Creatinine, Ser 0.45  0.50 - 1.10 (mg/dL)   Calcium 7.9 (*) 8.4 - 10.5 (mg/dL)   GFR calc non Af Amer 88 (*) >90 (mL/min)   GFR calc Af Amer >90  >90 (mL/min)  CBC     Status: Abnormal   Collection Time   07/22/11  3:29 AM      Component Value Range   WBC 9.7  4.0 - 10.5 (K/uL)   RBC 2.68 (*) 3.87 - 5.11 (MIL/uL)   Hemoglobin 7.9 (*) 12.0 - 15.0 (g/dL)   HCT 40.9 (*) 81.1 - 46.0 (%)   MCV 88.1  78.0 - 100.0 (fL)   MCH 29.5  26.0 - 34.0 (pg)   MCHC 33.5  30.0 - 36.0 (g/dL)   RDW 91.4  78.2 - 95.6 (%)   Platelets 218  150 - 400 (K/uL)  PROTIME-INR     Status: Abnormal   Collection Time   07/22/11  3:29 AM      Component Value Range   Prothrombin Time 29.7 (*) 11.6 - 15.2 (seconds)   INR 2.77 (*) 0.00 - 1.49   PREPARE RBC (CROSSMATCH)     Status: Normal   Collection Time   07/22/11  8:20 AM      Component Value Range   Order Confirmation ORDER PROCESSED BY BLOOD BANK    TYPE AND SCREEN     Status: Normal (Preliminary result)   Collection Time   07/22/11  8:20 AM      Component Value Range   ABO/RH(D) B POS     Antibody Screen NEG     Sample Expiration 07/25/2011     Unit Number [id number]     Blood Component Type RED CELLS,LR     Unit division 00     Status of Unit ALLOCATED     Transfusion Status OK TO TRANSFUSE     Crossmatch Result Compatible     Unit Number [id number]     Blood Component Type RED CELLS,LR     Unit division 00     Status of Unit ALLOCATED     Transfusion Status OK TO TRANSFUSE     Crossmatch Result Compatible    ABO/RH     Status: Normal   Collection Time   07/22/11  8:20 AM      Component Value Range   ABO/RH(D) B POS      Studies/Results: @RISRSLT24 @    Assessment: #1 melena  #2 anemia  #3 coagulopathy secondary to Coumadin  Plan: Coagulopathy will be reversed per Dr. Clelia Croft. We will proceed with EGD tomorrow to evaluate upper  GI tract    Henriette Hesser F 07/22/2011, 11:43 AM

## 2011-07-23 ENCOUNTER — Encounter (HOSPITAL_COMMUNITY): Payer: Self-pay | Admitting: *Deleted

## 2011-07-23 ENCOUNTER — Encounter (HOSPITAL_COMMUNITY)
Admission: EM | Disposition: A | Discharge status: Skilled Nursing Facility | Payer: Self-pay | Source: Home / Self Care | Attending: Internal Medicine

## 2011-07-23 HISTORY — PX: ESOPHAGOGASTRODUODENOSCOPY: SHX5428

## 2011-07-23 LAB — TYPE AND SCREEN
Antibody Screen: NEGATIVE
Unit division: 0

## 2011-07-23 LAB — BASIC METABOLIC PANEL
Calcium: 8.1 mg/dL — ABNORMAL LOW (ref 8.4–10.5)
GFR calc non Af Amer: >90 mL/min (ref >90)
Glucose, Bld: 97 mg/dL (ref 70–99)
Sodium: 135 mEq/L (ref 135–145)

## 2011-07-23 LAB — CBC
MCH: 28.6 pg (ref 26.0–34.0)
MCHC: 33.9 g/dL (ref 30.0–36.0)
Platelets: 194 10*3/uL (ref 150–400)
RBC: 3.46 MIL/uL — ABNORMAL LOW (ref 3.87–5.11)

## 2011-07-23 LAB — PROTIME-INR: Prothrombin Time: 15.4 seconds — ABNORMAL HIGH (ref 11.6–15.2)

## 2011-07-23 SURGERY — EGD (ESOPHAGOGASTRODUODENOSCOPY)
Anesthesia: Moderate Sedation

## 2011-07-23 MED ORDER — MIDAZOLAM HCL 10 MG/2ML IJ SOLN
INTRAMUSCULAR | Status: AC
  Filled 2011-07-23: qty 4

## 2011-07-23 MED ORDER — POTASSIUM CHLORIDE CRYS ER 20 MEQ PO TBCR
40.0000 meq | EXTENDED_RELEASE_TABLET | Freq: Once | ORAL | Status: AC
  Administered 2011-07-23: 40 meq via ORAL
  Filled 2011-07-23: qty 2

## 2011-07-23 MED ORDER — DIPHENHYDRAMINE HCL 50 MG/ML IJ SOLN
INTRAMUSCULAR | Status: AC
  Filled 2011-07-23: qty 1

## 2011-07-23 MED ORDER — FENTANYL CITRATE 0.05 MG/ML IJ SOLN
INTRAMUSCULAR | Status: AC
  Filled 2011-07-23: qty 4

## 2011-07-23 MED ORDER — FUROSEMIDE 10 MG/ML IJ SOLN
20.0000 mg | Freq: Once | INTRAMUSCULAR | Status: AC
  Administered 2011-07-23: 20 mg via INTRAVENOUS
  Filled 2011-07-23: qty 2

## 2011-07-23 MED ORDER — FENTANYL NICU IV SYRINGE 50 MCG/ML
INJECTION | INTRAMUSCULAR | Status: DC | PRN
  Administered 2011-07-23: 25 ug via INTRAVENOUS

## 2011-07-23 MED ORDER — BUTAMBEN-TETRACAINE-BENZOCAINE 2-2-14 % EX AERO
INHALATION_SPRAY | CUTANEOUS | Status: DC | PRN
  Administered 2011-07-23: 2 via TOPICAL

## 2011-07-23 MED ORDER — MIDAZOLAM HCL 10 MG/2ML IJ SOLN
INTRAMUSCULAR | Status: DC | PRN
  Administered 2011-07-23 (×3): 1 mg via INTRAVENOUS

## 2011-07-23 MED ORDER — BENAZEPRIL HCL 40 MG PO TABS
40.0000 mg | ORAL_TABLET | Freq: Every day | ORAL | Status: DC
  Administered 2011-07-23 – 2011-07-24 (×2): 40 mg via ORAL
  Filled 2011-07-23 (×2): qty 1

## 2011-07-23 NOTE — Progress Notes (Signed)
Subjective: She is anxious about EGD.  She requests something for nerves and wants her face washed.  Reports 1 dark stool yesterday afternoon.  Ongoing abdominal pain, unchanged.  Objective: Vital signs in last 24 hours: Temp:  [96.9 F (36.1 C)-98.9 F (37.2 C)] 98.2 F (36.8 C) (05/02 0521) Pulse Rate:  [71-98] 86  (05/02 0521) Resp:  [17-20] 18  (05/02 0521) BP: (109-136)/(64-96) 126/80 mmHg (05/02 0521) SpO2:  [91 %-100 %] 91 % (05/02 0521) Weight:  [67 kg (147 lb 11.3 oz)] 67 kg (147 lb 11.3 oz) (05/01 2100) Weight change: 2.589 kg (5 lb 11.3 oz) Last BM Date: 07/22/11  CBG (last 3)   Basename 07/22/11 2209  GLUCAP 109*    Intake/Output from previous day: 05/01 0701 - 05/02 0700 In: 2840 [P.O.:240; I.V.:2225; Blood:375] Out: -  Intake/Output this shift:    General appearance: alert and no distress Eyes: no scleral icterus; mild periorbital edema Throat: oropharynx moist without erythema Resp: clear to auscultation bilaterally and mild tachypnea Cardio: irregularly irregular rhythm GI: soft, non-tender; bowel sounds normal; no masses,  no organomegaly Extremities: no clubbing, cyanosis, trace edema   Lab Results:  Basename 07/22/11 0329 07/21/11 1132 07/21/11 1104  NA 128* 135 --  K 4.2 3.6 --  CL 95* 100 --  CO2 27 -- 28  GLUCOSE 113* 115* --  BUN 25* 55* --  CREATININE 0.54 0.70 --  CALCIUM 7.9* -- 9.0  MG -- -- --  PHOS -- -- --    Basename 07/21/11 1104  AST 18  ALT 20  ALKPHOS 35*  BILITOT 0.3  PROT 6.2  ALBUMIN 3.5    Basename 07/23/11 0530 07/22/11 0329 07/21/11 1104  WBC 9.6 9.7 --  NEUTROABS -- -- 6.9  HGB 9.9* 7.9* --  HCT 29.2* 23.6* --  MCV 84.4 88.1 --  PLT 194 218 --   Lab Results  Component Value Date   INR 1.19 07/23/2011   INR 2.77* 07/22/2011   INR 2.23* 07/21/2011    Studies/Results: Ct Abdomen Pelvis W Contrast  07/21/2011  *RADIOLOGY REPORT*  Clinical Data: Abdominal pain and rectal bleeding.  Lower abdominal pain.   CT ABDOMEN AND PELVIS WITH CONTRAST  Technique:  Multidetector CT imaging of the abdomen and pelvis was performed following the standard protocol during bolus administration of intravenous contrast.  Contrast: OMNIPAQUE IOHEXOL 300 MG/ML  SOLN  Comparison: CT of abdomen and pelvis 01/17/2008.  Findings:  Lung Bases: Unremarkable.  Abdomen/Pelvis:  Study is slightly limited by extensive patient respiratory motion.  Mild intrahepatic biliary ductal dilatation is unchanged compared to the prior study from 01/17/2008.  A subcentimeter low attenuation lesion in the posterior aspect of segment 7 of the liver is too small to characterize, but is unchanged compared to the prior examination, and is presumably benign (likely a small cyst or biliary hamartoma).  No other focal hepatic lesions are noted.  The gallbladder is not visualized, and may be surgically absent.  The enhanced appearance of the pancreas, spleen and bilateral adrenal glands is unremarkable.  In the anterior aspect of the interpolar region of the left kidney there is a 1.8 x 1.9 cm enhancing lesion which has increased in size compared to the prior study, and is highly suspicious for a small renal neoplasm.  Multiple additional low attenuation lesions are noted in the kidneys bilaterally, many of which are subcentimeter in size and too small to definitively characterize. The largest low attenuation renal lesion is in the right  kidney measuring up to 2.0 cm in diameter, and is compatible with a cyst. Dystrophic calcification in the periphery of the interpolar region of the right kidney is similar to the prior study.  There are numerous colonic diverticula, most pronounced in the region of the sigmoid colon.  At this time, there does not appear to be significant surrounding inflammatory changes to suggest an acute diverticulitis.  There is no ascites or pneumoperitoneum and no definite pathologic distension of bowel.  No definite pathologic adenopathy  identified within the abdomen or pelvis.  There is extensive atherosclerosis throughout the abdominal and pelvic vasculature, without evidence of aneurysm or dissection.  Musculoskeletal: Status post ORIF of left femoral neck. There are no aggressive appearing lytic or blastic lesions noted in the visualized portions of the skeleton.  IMPRESSION: 1.  Mild colonic diverticulosis of the sigmoid colon, without surrounding inflammatory changes to suggest acute diverticulitis. This may explain the patient's history of rectal bleeding. 2.  In addition, there is a large duodenal diverticulum from the second portion of the duodenum.  Typically, these are asymptomatic, but may occasionally be a source of GI bleeding, and sometimes are associated with bacterial overgrowth which can result in hypermetabolism of vitamin B12 and presentation with macrocytic anemia.  Clinical correlation is recommended. 3.  1.8 x 1.9 cm enhancing left renal lesion is highly suspicious for a solid neoplasm such as a renal cell carcinoma. Urology consultation is recommended.  These results were called by telephone on 07/21/2011  at  02:40 p.m. to  PA, Ruby Cola, who verbally acknowledged these results.  Original Report Authenticated By: Florencia Reasons, M.D.     Medications: Scheduled:   . acetaminophen  650 mg Oral Once  . carvedilol  12.5 mg Oral BID WC  . cefTRIAXone (ROCEPHIN)  IV  1 g Intravenous Q24H  . diphenhydrAMINE  25 mg Oral Once  . furosemide  20 mg Intravenous Once  . furosemide  20 mg Intravenous Once  . levothyroxine  56 mcg Oral QAC breakfast  . pantoprazole (PROTONIX) IV  40 mg Intravenous Q12H  . phytonadione (VITAMIN K) IV  5 mg Intravenous Once  . sodium chloride  3 mL Intravenous Q12H   Continuous:   . sodium chloride 75 mL/hr at 07/22/11 2205    Assessment/Plan: Active Problems:  1. Acute GI bleeding- Going for EGD now.  Hg stable after 2 Units PRBCs. Continue PPI bid. 2. Acute Blood  Loss Anemia- Hg appropriately increased after 2 units PRBCs.  Will continue to monitor. 3. Abdominal pain- some pain is chronic. continue 4. Renal mass, left- CT is worrisome for renal cell cancer. Appreciate Urology evaluation- will plan for outpatient evaluation.  5. Chronic back pain- I do not think this is related to her renal mass. I've reiterated this to her daughter. We'll continue narcotics as needed for now. Avoid NSAIDs which she has not been taking recently.  6. Recurrent UTI-will treat with Rocephin pending urine culture.  7. Atrial fibrillation- we'll continue Coreg for rate control. Coumadin on hold due to her active GI bleeding. Will consider resuming once bleeding has resolved. I did discuss the low risk of CVA with discontinuation of Coumadin but I believe the risk of continuing Coumadin exceeds the benefit at this point.  8. Warfarin anticoagulation-reversed with Vitamin K yesterday due to ongoing bleeding.   9. ANXIETY DISORDER- continue Ativan as needed  10. HYPERTENSION-BP stable- will hold antihypertensives except for coreg and resume as tolerated. Will give Lasix X  today as she appears mildyly tachypneic and slightly edematous after fluids and blood products. 11. Disposition- anticipate discharge in 1-2 days pending EGD results and need for further evaluation.  Discontinue telemetry.  PT/OT evaluation.    LOS: 2 days   Bueford Arp,W DOUGLAS 07/23/2011, 7:25 AM 3

## 2011-07-23 NOTE — Evaluation (Signed)
Physical Therapy Evaluation Patient Details Name: Natalie [name] MRN: [id number] DOB: Oct 22, 1930 Today's Date: 07/23/2011 Time: 4696-2952 PT Time Calculation (min): 23 min  PT Assessment / Plan / Recommendation Clinical Impression  Natalie [name] is 78 y/o female who came to emergency department 07/21/11 with c/o black stools. PT evaluation performed today and pt demonstrates generalized weakness, balance and mobility deficits different from her prior level of function. Will benefit physical therapy in the acute setting to address these and the below  problem list so as to maximize pt's function  and independence to decrease BOC and improve safety for d/c home.     PT Assessment  Patient needs continued PT services    Follow Up Recommendations  Home health PT;Supervision/Assistance - 24 hour    Equipment Recommendations  None recommended by PT    Frequency Min 3X/week    Precautions / Restrictions Precautions Precautions: Fall         Mobility  Bed Mobility Bed Mobility: Supine to Sit;Sitting - Scoot to [place] of Bed;Sit to Supine Supine to Sit: 5: Supervision Sitting - Scoot to Edge of Bed: 5: Supervision Sit to Supine: 4: Min assist Details for Bed Mobility Assistance: pt sat straight up in long sit position, cues to bring legs to the edge and to even hips EOB; minA to bring legs up to the bed and to reposition, verbal cues to scoot to [facility] Transfers Transfers: Sit to Stand;Stand to Sit;Squat Pivot Transfers Sit to Stand: 4: Min assist;With armrests;From bed;From chair/3-in-1;With upper extremity assist Stand to Sit: 4: Min assist;With upper extremity assist;To chair/3-in-1;To bed;With armrests Squat Pivot Transfers: 4: Min assist;With armrests (with RW, bed<->3in1) Details for Transfer Assistance: cues for safety and technique; initially pt standing with no RW in front of her and standing very flexed and crouched/fearful requiring bilateral upper extremity assist; cues for safe  technique with hand placement especially when using RW pt pullin on RW regardless of verbal cues Ambulation/Gait Ambulation/Gait Assistance: 3: Mod assist Ambulation Distance (Feet): 2 Feet Assistive device: 2 person hand held assist Ambulation/Gait Assistance Details: pt took 2 side steps to the head of the bed needing bilateral upper extremity support from therapist to stabilize, very short steps, verbal cues for sequencing    Exercises  Educated pt on performing ankle pumps for bed exercises   PT Goals Acute Rehab PT Goals PT Goal Formulation: With patient Time For Goal Achievement: 08/06/11 Pt will go Supine/Side to Sit: with modified independence PT Goal: Supine/Side to Sit - Progress: Goal set today Pt will go Sit to Supine/Side: with modified independence PT Goal: Sit to Supine/Side - Progress: Goal set today Pt will go Sit to Stand: with modified independence PT Goal: Sit to Stand - Progress: Goal set today Pt will go Stand to Sit: with modified independence PT Goal: Stand to Sit - Progress: Goal set today Pt will Transfer Bed to Chair/Chair to Bed: with modified independence PT Transfer Goal: Bed to Chair/Chair to Bed - Progress: Goal set today Pt will Ambulate: 51 - 150 feet;with modified independence;with least restrictive assistive device PT Goal: Ambulate - Progress: Goal set today Pt will Go Up / Down Stairs: 3-5 stairs;with min assist;with rail(s) PT Goal: Up/Down Stairs - Progress: Goal set today Pt will Perform Home Exercise Program: Independently PT Goal: Perform Home Exercise Program - Progress: Goal set today  Visit Information  Last PT Received On: 07/23/11 Assistance Needed: +1    Subjective Data  Subjective: Oh heavens no!  Patient Stated  Goal: Im going home tomorrow, and if not Im running home.    Prior Functioning  Home Living Lives With: Daughter Available Help at Discharge: Family;Available 24 hours/day (daughter works from home) Type of Home:  House Home Access: Stairs to enter [employer] of Steps: 3 Entrance Stairs-Rails: Can reach both;Right;Left Home Layout: Two level;Able to live on main level with bedroom/bathroom Alternate Level Stairs-Number of Steps: her bedroom is on the second floor but she reports she can sleep downstairs if necessary; one flight of stairs to go up to her bedroom Alternate Level Stairs-Rails: Right;Left;Can reach both Home Adaptive Equipment: Walker - rolling;Straight cane Prior Function Level of Independence: Independent with assistive device(s) Able to Take Stairs?: Yes Driving: No Vocation: Retired Comments: daughter does cooking and cleaning; she reports she is independent with bathing and dressing Communication Communication: No difficulties    Cognition  Overall Cognitive Status: Appears within functional limits for tasks assessed/performed Arousal/Alertness: Awake/alert Orientation Level: Appears intact for tasks assessed Behavior During Session: [facility] for tasks performed Cognition - Other Comments: wishy washy with her responses, originally agreeable to getting out of the bed to the chair but after I returned with linens pt reporting she is too tired to get up, agreeable to sit EOB and stand to get in better position in her bed; once sitting EOB pt made a comment about needing to urinate all of the time because of her lasix but when asked if she wanted to go to the 3in1 prior to lying down she said no; immediately upon lying down pt reports she needed me to help her get on the [facility]    Extremity/Trunk Assessment Right Upper Extremity Assessment RUE ROM/Strength/Tone:  (defer to OT) Left Upper Extremity Assessment LUE ROM/Strength/Tone:  (defer to OT) Right Lower Extremity Assessment RLE ROM/Strength/Tone: Deficits RLE ROM/Strength/Tone Deficits: grossly 3-4/5; generalized weakness noted (pt needing to use her arms to bring herself up over her weaker legs) RLE Sensation: WFL - Light  Touch;WFL - Proprioception (denies sensation changes) RLE Coordination: WFL - gross/fine motor;WFL - gross motor Left Lower Extremity Assessment LLE ROM/Strength/Tone: Deficits LLE ROM/Strength/Tone Deficits: grossly 3-4/5; generalized weakness noted (pt needing to use her arms to bring herself up over her weaker legs) LLE Sensation: WFL - Light Touch;WFL - Proprioception (denies sensation changes) LLE Coordination: WFL - gross/fine motor;WFL - gross motor Trunk Assessment Trunk Assessment: Kyphotic   Balance Static Standing Balance Static Standing - Balance Support: Bilateral upper extremity supported Static Standing - Level of Assistance: 4: Min assist  End of Session PT - End of Session Equipment Utilized During Treatment: Gait belt Activity Tolerance: Patient limited by fatigue Patient left: in bed;with call bell/phone within reach;with bed alarm set Nurse Communication: Mobility status   [facility] HELEN 07/23/2011, 12:36 PM

## 2011-07-23 NOTE — Op Note (Signed)
[name] [address] [place], [place]  [address]  ENDOSCOPY PROCEDURE REPORT  PATIENT:  Natalie [name], Natalie [name]  MR#:  [id number] BIRTHDATE:  1930/08/19, 80 yrs. old  GENDER:  female  ENDOSCOPIST:  Wandalee Ferdinand, MD Referred by:  PROCEDURE DATE:  07/23/2011 PROCEDURE: EGD ASA CLASS: 3 INDICATIONS: Melena, anemia  MEDICATIONS: Fentanyl 25 mcg IV, Versed 3 mg IV TOPICAL ANESTHETIC: Cetacaine spray  DESCRIPTION OF PROCEDURE:   After the risks benefits and alternatives of the procedure were thoroughly explained, informed consent was obtained.  The EG-[id number] ([id number]) endoscope was introduced through the mouth and advanced to the second portion of the duodenum , without limitations.  The instrument was slowly withdrawn as the mucosa was fully examined. <<PROCEDUREIMAGES>>  FINDINGS: Esophagus: Normal Stomach: Several small superficial antral ulcerations without evidence of active bleeding at this time. See images 1 and 2.  One biopsy of antrum obtained primarily to look for evidence of H. pylori  Duodenum: Normal  COMPLICATIONS: None  ENDOSCOPIC IMPRESSION: Several small superficial antral ulcerations without evidence of active bleeding  RECOMMENDATIONS: PPI therapy, check biopsy for H. pylori and if positive treat.  ______________________________ Wandalee Ferdinand, MD  CC:  n. eSIGNED:   [name] at 07/23/2011 08:50 AM  Garen Grams, [phone]

## 2011-07-23 NOTE — Progress Notes (Signed)
07-23-11 UR completed. Amillion Scobee RN BSN  

## 2011-07-24 ENCOUNTER — Encounter (HOSPITAL_COMMUNITY): Payer: Self-pay | Admitting: Gastroenterology

## 2011-07-24 DIAGNOSIS — Z8719 Personal history of other diseases of the digestive system: Secondary | ICD-10-CM | POA: Diagnosis present

## 2011-07-24 LAB — CBC
MCV: 86.7 fL (ref 78.0–100.0)
Platelets: 225 10*3/uL (ref 150–400)
RDW: 14.6 % (ref 11.5–15.5)
WBC: 9.1 10*3/uL (ref 4.0–10.5)

## 2011-07-24 LAB — BASIC METABOLIC PANEL
CO2: 32 mEq/L (ref 19–32)
Calcium: 8.3 mg/dL — ABNORMAL LOW (ref 8.4–10.5)
Creatinine, Ser: 0.45 mg/dL — ABNORMAL LOW (ref 0.50–1.10)
GFR calc Af Amer: >90 mL/min (ref >90)
GFR calc non Af Amer: >90 mL/min (ref >90)

## 2011-07-24 MED ORDER — AMLODIPINE BESYLATE 2.5 MG PO TABS
2.5000 mg | ORAL_TABLET | Freq: Every day | ORAL | Status: DC
  Administered 2011-07-24: 2.5 mg via ORAL
  Filled 2011-07-24: qty 1

## 2011-07-24 MED ORDER — POTASSIUM CHLORIDE CRYS ER 20 MEQ PO TBCR
40.0000 meq | EXTENDED_RELEASE_TABLET | Freq: Once | ORAL | Status: AC
  Administered 2011-07-24: 40 meq via ORAL
  Filled 2011-07-24: qty 2

## 2011-07-24 MED ORDER — PANTOPRAZOLE SODIUM 40 MG PO TBEC: 40.0000 mg | DELAYED_RELEASE_TABLET | Freq: Two times a day (BID) | ORAL | Status: DC

## 2011-07-24 NOTE — Progress Notes (Signed)
Order received, chart reviewed, noted that pt has bed available at SNF today. Defer OT to that facility. Acute OT signing off.

## 2011-07-24 NOTE — Progress Notes (Signed)
Discussed discharge instructions with pt. Pt showed no barriers to discharge. D/C instructions and prescription given to pt. IV removed. Pt D/C'd to [facility] via EMS. Assessment unchanged from morning.

## 2011-07-24 NOTE — Discharge Instructions (Signed)
Gastrointestinal Bleeding Gastrointestinal (GI) bleeding is bleeding from the gut or any place between your mouth and anus. If bleeding is slow, you may be allowed to go home. If there is a lot of bleeding, hospitalization and observation are often required. SYMPTOMS   You vomit bright red blood or material that looks like coffee grounds.   You have blood in your stools or the stools look black and tarry.  DIAGNOSIS  Your caregiver may diagnose your condition by taking a history and a physical exam. More tests may be needed, including:  X-rays.   EGD (esophagogastroduodenoscopy), which looks at your esophagus, stomach, and small bowel through a flexible telescope-like instrument.   Colonoscopy, which looks at your colon/large bowel through a flexible telescope-like instrument.   Biopsies, which remove a small sample of tissue to examine under a microscope.  Finding out the results of your test Not all test results are available during your visit. If your test results are not back during the visit, make an appointment with your caregiver to find out the results. Do not assume everything is normal if you have not heard from your caregiver or the medical facility. It is important for you to follow up on all of your test results. HOME CARE INSTRUCTIONS   Follow instructions as suggested by your caregiver regarding medicines. Do not take aspirin, drink alcohol, or take medicines for pain and arthritis unless your caregiver says it is okay.   Get the suggested follow-up care when the tests are done.  SEEK IMMEDIATE MEDICAL CARE IF:   Your bleeding increases or you become lightheaded, weak, or pass out (faint).   You experience severe cramps in your stomach, back, or belly (abdomen).   You pass large clots.   The problems which brought you in for medical care get worse.  MAKE SURE YOU:   Understand these instructions.   Will watch your condition.   Will get help right away if you are  not doing well or get worse.  Document Released: 03/06/2000 Document Revised: 02/26/2011 Document Reviewed: 02/16/2011 [employer] Patient Information 2012 [employer]. 

## 2011-07-24 NOTE — Discharge Summary (Addendum)
DISCHARGE SUMMARY  Natalie [name]  MR#: [id number]  DOB:[birth year]  Date of Admission: 07/21/2011 Date of Discharge: 07/24/2011  Attending Physician:Kennette Cuthrell,W DOUGLAS  Patient's WUJ:WJXB,J Riley Lam, MD, MD  Consults: Herbert Moors, MD- [facility] Jethro Bolus- Urology  Discharge Diagnoses: Principal Problem:  *Acute gastric ulcer with bleeding Active Problems:  Acute blood loss anemia   Atrial fibrillation  Warfarin anticoagulation  Abdominal pain  Renal mass, left  Recurrent UTI  ANXIETY DISORDER  HYPERTENSION  Chronic back pain  Past Medical History (reviewed - no changes required):  Afib on anticoagulation  HTN with white coat component  Hypothyroid  Osteoporosis  Shingles, L shoulder  IBS?  L adrenal mass  Trigeminal neuralgia  Abdominal pain  Anxiety  Generalized weakness  Hyponatremia/Hypokalemia  Diverticulitis  Chronic pain  Depression  Chronic cystitis  Microscopic hematuria   Surgical History (reviewed - no changes required): L hip fx  Chole  s/p BSO/TAH  Cataract surgery  Discharge Medications: Medication List  As of 07/24/2011  7:33 AM   STOP taking these medications         aspirin 81 MG chewable tablet      warfarin 2.5 MG tablet         TAKE these medications         amLODipine 5 MG tablet   Commonly known as: NORVASC   Take 2.5 mg by mouth daily.      benazepril 40 MG tablet   Commonly known as: LOTENSIN   Take 40 mg by mouth daily.      calcium citrate 950 MG tablet   Commonly known as: CALCITRATE - dosed in mg elemental calcium   Take 1 tablet by mouth 2 (two) times daily.      carvedilol 12.5 MG tablet   Commonly known as: COREG   Take 12.5 mg by mouth 2 (two) times daily with a meal.      dicyclomine 10 MG capsule   Commonly known as: BENTYL   Take 10 mg by mouth 4 (four) times daily -  before meals and at bedtime.      HYDROcodone-acetaminophen 5-500 MG per tablet   Commonly known as: VICODIN   Take 1 tablet by  mouth daily as needed.      levothyroxine 112 MCG tablet   Commonly known as: SYNTHROID, LEVOTHROID   Take 56 mcg by mouth daily.      LORazepam 1 MG tablet   Commonly known as: ATIVAN   Take 1 mg by mouth 4 (four) times daily as needed. Anxiety.      pantoprazole 40 MG tablet   Commonly known as: PROTONIX   Take 1 tablet (40 mg total) by mouth 2 (two) times daily before a meal.            Hospital Procedures: Ct Abdomen Pelvis W Contrast 07/21/2011  *RADIOLOGY REPORT*  Clinical Data: Abdominal pain and rectal bleeding.  Lower abdominal pain.  CT ABDOMEN AND PELVIS WITH CONTRAST  Technique:  Multidetector CT imaging of the abdomen and pelvis was performed following the standard protocol during bolus administration of intravenous contrast.  Contrast: OMNIPAQUE IOHEXOL 300 MG/ML  SOLN  Comparison: CT of abdomen and pelvis 01/17/2008.  Findings:  Lung Bases: Unremarkable.  Abdomen/Pelvis:  Study is slightly limited by extensive patient respiratory motion.  Mild intrahepatic biliary ductal dilatation is unchanged compared to the prior study from 01/17/2008.  A subcentimeter low attenuation lesion in the posterior aspect of segment 7 of the liver  is too small to characterize, but is unchanged compared to the prior examination, and is presumably benign (likely a small cyst or biliary hamartoma).  No other focal hepatic lesions are noted.  The gallbladder is not visualized, and may be surgically absent.  The enhanced appearance of the pancreas, spleen and bilateral adrenal glands is unremarkable.  In the anterior aspect of the interpolar region of the left kidney there is a 1.8 x 1.9 cm enhancing lesion which has increased in size compared to the prior study, and is highly suspicious for a small renal neoplasm.  Multiple additional low attenuation lesions are noted in the kidneys bilaterally, many of which are subcentimeter in size and too small to definitively characterize. The largest low  attenuation renal lesion is in the right kidney measuring up to 2.0 cm in diameter, and is compatible with a cyst. Dystrophic calcification in the periphery of the interpolar region of the right kidney is similar to the prior study.  There are numerous colonic diverticula, most pronounced in the region of the sigmoid colon.  At this time, there does not appear to be significant surrounding inflammatory changes to suggest an acute diverticulitis.  There is no ascites or pneumoperitoneum and no definite pathologic distension of bowel.  No definite pathologic adenopathy identified within the abdomen or pelvis.  There is extensive atherosclerosis throughout the abdominal and pelvic vasculature, without evidence of aneurysm or dissection.  Musculoskeletal: Status post ORIF of left femoral neck. There are no aggressive appearing lytic or blastic lesions noted in the visualized portions of the skeleton.  IMPRESSION: 1.  Mild colonic diverticulosis of the sigmoid colon, without surrounding inflammatory changes to suggest acute diverticulitis. This may explain the patient's history of rectal bleeding. 2.  In addition, there is a large duodenal diverticulum from the second portion of the duodenum.  Typically, these are asymptomatic, but may occasionally be a source of GI bleeding, and sometimes are associated with bacterial overgrowth which can result in hypermetabolism of vitamin B12 and presentation with macrocytic anemia.  Clinical correlation is recommended. 3.  1.8 x 1.9 cm enhancing left renal lesion is highly suspicious for a solid neoplasm such as a renal cell carcinoma. Urology consultation is recommended.  These results were called by telephone on 07/21/2011  at  02:40 p.m. to  PA, Ruby Cola, who verbally acknowledged these results.  Original Report Authenticated By: Florencia Reasons, M.D.   Transfusion 2 units packed red blood cells (07/25/11)  Upper endoscopy (07/23/11)-Dr. Herbert Moors Several small  superficial antral  ulcerations without evidence of active bleeding    History of Present Illness: (From admission H&P):  Natalie [name] is a 78 year old white female with a history of atrial fibrillation on chronic anticoagulation, hypertension, and chronic back and abdominal pain who presented to the emergency department with the complaint of black stools. Patient states that she has been her usual state of health with her ongoing back and abdominal pain recently. Yesterday morning she started having dark tarry stools, the color of licorice. These continued throughout the night and into the morning. It is difficult to quantify but it sounds like she had at least 3-4 bowel movements. She may have had a slight increase in her lower abdominal pain as well as some upper abdominal pain, her history is inconsistent. She denied any NSAID use or alcohol use.  She is chronically anticoagulated on Coumadin and so when she called with these symptoms she was referred to the emergency department where she was found  to be heme positive with a hemoglobin of 10.1 and a BUN of 55. Her INR is 2.23. She underwent a CT of the abdomen and pelvis that showed a large duodenal diverticulum and sigmoid diverticulosis. Incidentally noted was a worrisome left renal mass. She's had no further bleeding in the emergency department. She has received some morphine and fentanyl for her pain. She had a transient episode of hypotension which is resolved with IV fluid hydration and was felt to be related to the narcotics. Given these issues she is being admitted for further management.   Hospital Course: Natalie [name] was admitted to a telemetry bed.  She was monitored closely with frequent hemoglobin checks which initially dropped to 7.9. Given this acute drop she received 2 units of packed red blood cells. Her Coumadin-induced coagulopathy was reversed with vitamin K and her aspirin and Coumadin were held on admission.  She was placed  empirically on a PPI twice a day by IV. GI was consulted and performed an endoscopy which revealed several small superficial antral ulcers with no evidence of active bleeding. This was felt to be the source of her upper GI bleed.  Biopsy for H. pylori is pending. She will continue on a PPI twice a day. Following her transfusion her hemoglobin has remained stable for 48 hours. She did have one small dark bowel movement on the day prior to discharge but has not had any other significant bowel movements. Her abdominal pain has improved and is back to baseline.  Incidentally, on her admission CT she was found to have a worrisome left renal mass. Urology was consulted and she was evaluated by Dr. Patsi Sears who recommended an outpatient evaluation by her primary urologist, Dr. Isabel Caprice.  Her other medical issues remained stable throughout the hospitalization. Her blood pressure medications other than carvedilol were initially held due to the risk of transient hypotension. Her blood pressure has trended up and her blood pressure medications have all been sequentially restarted. She has remained in atrial fibrillation.  We discussed the risks and benefits of continuing aspirin or anticoagulation- at this point the risk clearly exceeds the benefit. We'll reassess in the next 3-4 weeks and consider restarting her Coumadin if she remains stable with no further bleeding.  At this point, given the stability of her hemoglobin and lack of ongoing GI blood loss she is stable for discharge home with close outpatient followup. I have reminded her to avoid aspirin, NSAIDs, and alcohol.  Day of Discharge Exam BP 171/91  Pulse 87  Temp(Src) 97.4 F (36.3 C) (Oral)  Resp 17  Ht 5\' 2"  (1.575 m)  Wt 64.8 kg (142 lb 13.7 oz)  BMI 26.13 kg/m2  SpO2 94%  Physical Exam: General appearance: alert and no distress Eyes: no scleral icterus Throat: oropharynx moist without erythema Resp: clear to auscultation  bilaterally Cardio: irregularly irregular rhythm GI: soft, minimal diffuse tenderness, improved; bowel sounds normal; no masses,  no organomegaly Extremities: no clubbing, cyanosis or edema  Discharge Labs:  [facility] 07/24/11 0635 07/23/11 0530  NA 133* 135  K 3.2* 3.6  CL 95* 99  CO2 32 30  GLUCOSE 104* 97  BUN 7 9  CREATININE 0.45* 0.44*  CALCIUM 8.3* 8.1*  MG -- --  PHOS -- --    Basename 07/21/11 1104  AST 18  ALT 20  ALKPHOS 35*  BILITOT 0.3  PROT 6.2  ALBUMIN 3.5    Basename 07/24/11 0635 07/23/11 0530 07/21/11 1104  WBC 9.1 9.6 --  NEUTROABS -- -- 6.9  HGB 10.7* 9.9* --  HCT 31.4* 29.2* --  MCV 86.7 84.4 --  PLT 225 194 --   Lab Results  Component Value Date   INR 1.04 07/24/2011   INR 1.19 07/23/2011   INR 2.77* 07/22/2011    Discharge instructions: Discharge Orders    Future Orders Please Complete By Expires   Diet - low sodium heart healthy      Increase activity slowly      Discharge instructions      Comments:   Call if increased abdominal pain, nausea/vomiting, increased dark black stools, or blood in stools. Avoid aspirin products, NSAIDs (Advil, Aleve, ibuprofen, Excedrin, etc.), and alcohol.   Other Restrictions      Comments:   Recommend 24-hour assistance for the next several days. Fall precautions.      Disposition: To home  Follow-up Appts: Follow-up with Dr. Clelia Croft at [facility] in 1 week  Condition on Discharge: Improved, stable  Tests Needing Follow-up: Repeat CBC at followup office visit. Followup on H. pylori biopsy results  Time with discharge activities: 40 minutes  Signed: Janell Keeling,W DOUGLAS 07/24/2011, 7:33 AM    I was called after discharge orders from RN who reported that patient remains weak and family is unable to ensure 24 hour assistance.  Reevaluated by PT who feel that SNF rehab is warranted.  Discussed with daughter, [name] who states that her sister lives with the patient but works and will not be  available for 24 hour care.  Arrangements made for discharge to [facility] rehab.

## 2011-07-24 NOTE — Progress Notes (Signed)
Physical Therapy Treatment Patient Details Name: Natalie [name] MRN: [id number] DOB: 12-Jul-1930 Today's Date: 07/24/2011 Time: [phone] (additional time with daughter discussing options [id number]) PT Time Calculation (min): 22 min  PT Assessment / Plan / Recommendation Comments on Treatment Session  Pt. alone during PT session, daughter arrived later [name]).  This PT  stopped into pt's room to report pt's progress with pt's permission.  [name] expressed concern that in her opinion, pt. is not ready to DC home with other daughter.  Discussed options with pt. and with [name] of home with HHPT and need for 24 hour supervision vs. ST SNF for rehab.  Pt would clearly benefit from 5x/wk. PT at SNF and daughter and pt. are now leaning in this direction.  Discussed with pt's RN Danford Bad and with [facility] care coordinator.  Danford Bad is to contact  MD with the above information.  I provided my contact # to Danford Bad in case MD has questions for me.  While both options (HHPT vs ST SNF) would be "workable", I believe it is in the pt's best interest to pursue rehab at [facility].         Follow Up Recommendations  Skilled nursing facility;Supervision/Assistance - 24 hour    Equipment Recommendations  Defer to next venue    Frequency Min 3X/week   Plan Discharge plan needs to be updated    Precautions / Restrictions Precautions Precautions: Fall Restrictions Weight Bearing Restrictions: No       Mobility  Bed Mobility Bed Mobility: Sitting - Scoot to Edge of Bed Supine to Sit: Other (comment) (RN had gotten pt. to EOB with min assist prior to PT) Sitting - Scoot to Edge of Bed: 4: Min assist Transfers Transfers: Sit to Stand;Stand to Sit Sit to Stand: 4: Min assist;With upper extremity assist;From bed Stand to Sit: 5: Supervision;With upper extremity assist;To bed Details for Transfer Assistance: pt. initially stood with cane and was somewhat unsteady; had just awakened to go to bathroom. Needed  cane for right sided support and min hand hold assist on left side. Ambulation/Gait Ambulation/Gait Assistance: 4: Min guard Ambulation Distance (Feet): 100 Feet Assistive device: Rolling walker Ambulation/Gait Assistance Details: pt. tends to walk in a flexed position but was steady on RWwith no overt LOB Gait Pattern: Step-through pattern;Trunk flexed Stairs: Yes Stairs Assistance: 4: Min assist;Other (comment) (second person present for safety purposes) Stairs Assistance Details (indicate cue type and reason): pt. held railing on right side and min assiist from therapist with her left hand.  Pt. went up 4 steps, became fearful and turned to descend steps. Stair Management Technique: One rail Right;Other (comment) (and one hand hold assist) Number of Stairs: 4     Exercises     PT Goals Acute Rehab PT Goals PT Goal: Supine/Side to Sit - Progress: Progressing toward goal PT Goal: Sit to Stand - Progress: Progressing toward goal PT Goal: Stand to Sit - Progress: Progressing toward goal PT Goal: Ambulate - Progress: Progressing toward goal PT Goal: Up/Down Stairs - Progress: Progressing toward goal  Visit Information  Last PT Received On: 07/24/11 Assistance Needed: +1    Subjective Data  Subjective: Pt. thinks she is prepared and ready to go home with one daughter, other daughter [name] does not agree.   Cognition  Overall Cognitive Status: Appears within functional limits for tasks assessed/performed Arousal/Alertness: Awake/alert Orientation Level: Appears intact for tasks assessed Behavior During Session: [facility] for tasks performed    Balance  Static Standing Balance Static Standing -  Balance Support: No upper extremity supported;During functional activity (at sink while washing hands, brushing teeth; flexed posture)  End of Session PT - End of Session Equipment Utilized During Treatment: Gait belt Activity Tolerance: Patient limited by fatigue;Other (comment) (limited by  fear) Patient left: in bed;with call bell/phone within reach;Other (comment) (pt. requested to sit at [facility]) Nurse Communication: Mobility status    Ferman Hamming 07/24/2011, 10:47 AM Acute Rehabilitation Services [phone] (pager)

## 2011-07-24 NOTE — Clinical Social Work Note (Signed)
Patient discharging to [facility] for short-term rehab today. Discharge information forwarded to facility. CSW facilitated transport to facility via ambulance.  Genelle Bal, MSW, LCSW [phone]

## 2011-07-24 NOTE — Clinical Social Work Psychosocial (Signed)
Clinical Social Work Department BRIEF PSYCHOSOCIAL ASSESSMENT 07/24/2011  Patient:  CAYLIN, NASS     Account Number:  [id number]     Admit date:  07/21/2011  Clinical Social Worker:  Delmer Islam  Date/Time:  07/24/2011 10:56 AM  Referred by:  Physician  Date Referred:  07/24/2011 Referred for  SNF Placement   Other Referral:   Interview type:  Patient Other interview type:   CSW Intern also talked with patient's daughter Antony Odea - [phone], who was at the bedside.    PSYCHOSOCIAL DATA Living Status:  ALONE Admitted from facility:   Level of care:  Skilled Nursing Facility Primary support name:  Antony Odea Primary support relationship to patient:  CHILD, ADULT Degree of support available:   Good, daughter at the bedside    CURRENT CONCERNS Current Concerns  Post-Acute Placement   Other Concerns:    SOCIAL WORK ASSESSMENT / PLAN CSW Intern talked with patient and daughter and confirmed patient's decision to go to SNF for short-term rehab. [facility] preferences [facility] and [facility].   Assessment/plan status:  Psychosocial Support/Ongoing Assessment of Needs Other assessment/ plan:   CSW received call from [facility] admission director Wille Celeste advising that patient has a bed today at their facility.   Information/referral to community resources:    PATIENT'S/FAMILY'S RESPONSE TO PLAN OF CARE: Patient reluctant, but agreeable to short-term rehab at a facility. Daughter pleased that [facility] bed, however patient verbalized surprise when told that [facility] could take her today.

## 2011-07-24 NOTE — Clinical Social Work Placement (Signed)
Clinical Social Work Department CLINICAL SOCIAL WORK PLACEMENT NOTE 07/24/2011  Patient:  Natalie [name], Natalie [name]  Account Number:  [id number] Admit date:  07/21/2011  Clinical Social Worker:  Genelle Bal, LCSW  Date/time:  07/24/2011 11:13 AM  Clinical Social Work is seeking post-discharge placement for this patient at the following level of care:   SKILLED NURSING   (*CSW will update this form in Epic as items are completed)     Patient/family provided with [facility] Department of Clinical Social Work's list of facilities offering this level of care within the geographic area requested by the patient (or if unable, by the patient's family).  07/24/2011  Patient/family informed of their freedom to choose among providers that offer the needed level of care, that participate in Medicare, Medicaid or managed care program needed by the patient, have an available bed and are willing to accept the patient.    Patient/family informed of [facility]' ownership interest in [facility], as well as of the fact that they are under no obligation to receive care at this facility.  PASARR submitted to EDS on 07/24/2011 PASARR number received from EDS on 07/24/2011  FL2 transmitted to all facilities in geographic area requested by pt/family on  07/24/2011 FL2 transmitted to all facilities within larger geographic area on   Patient informed that his/her managed care company has contracts with or will negotiate with  certain facilities, including the following:     Patient/family informed of bed offers received:  07/24/2011 Patient chooses bed at [facility] Physician recommends and patient chooses bed at  [facility] REH[facility]  Patient to be transferred to [facility] on  07/24/2011 Patient to be transferred to facility by Ambulance  The following physician request were entered in Epic:   Additional Comments:

## 2011-08-14 ENCOUNTER — Encounter (HOSPITAL_COMMUNITY): Payer: Self-pay

## 2011-08-14 ENCOUNTER — Emergency Department (HOSPITAL_COMMUNITY): Payer: Medicare Other

## 2011-08-14 ENCOUNTER — Inpatient Hospital Stay (HOSPITAL_COMMUNITY)
Admission: EM | Admit: 2011-08-14 | Discharge: 2011-09-01 | DRG: 208 | Disposition: A | Discharge status: Skilled Nursing Facility | Payer: Medicare Other | Attending: Internal Medicine | Admitting: Internal Medicine

## 2011-08-14 DIAGNOSIS — E039 Hypothyroidism, unspecified: Secondary | ICD-10-CM

## 2011-08-14 DIAGNOSIS — J449 Chronic obstructive pulmonary disease, unspecified: Secondary | ICD-10-CM | POA: Diagnosis present

## 2011-08-14 DIAGNOSIS — D649 Anemia, unspecified: Secondary | ICD-10-CM | POA: Diagnosis present

## 2011-08-14 DIAGNOSIS — G8929 Other chronic pain: Secondary | ICD-10-CM

## 2011-08-14 DIAGNOSIS — J9620 Acute and chronic respiratory failure, unspecified whether with hypoxia or hypercapnia: Principal | ICD-10-CM | POA: Diagnosis present

## 2011-08-14 DIAGNOSIS — I5033 Acute on chronic diastolic (congestive) heart failure: Secondary | ICD-10-CM | POA: Diagnosis not present

## 2011-08-14 DIAGNOSIS — G9341 Metabolic encephalopathy: Secondary | ICD-10-CM

## 2011-08-14 DIAGNOSIS — I509 Heart failure, unspecified: Secondary | ICD-10-CM | POA: Diagnosis present

## 2011-08-14 DIAGNOSIS — R64 Cachexia: Secondary | ICD-10-CM | POA: Diagnosis present

## 2011-08-14 DIAGNOSIS — R404 Transient alteration of awareness: Secondary | ICD-10-CM | POA: Diagnosis present

## 2011-08-14 DIAGNOSIS — Z7901 Long term (current) use of anticoagulants: Secondary | ICD-10-CM

## 2011-08-14 DIAGNOSIS — R339 Retention of urine, unspecified: Secondary | ICD-10-CM

## 2011-08-14 DIAGNOSIS — J441 Chronic obstructive pulmonary disease with (acute) exacerbation: Secondary | ICD-10-CM | POA: Diagnosis present

## 2011-08-14 DIAGNOSIS — I10 Essential (primary) hypertension: Secondary | ICD-10-CM

## 2011-08-14 DIAGNOSIS — F329 Major depressive disorder, single episode, unspecified: Secondary | ICD-10-CM

## 2011-08-14 DIAGNOSIS — J189 Pneumonia, unspecified organism: Secondary | ICD-10-CM | POA: Diagnosis present

## 2011-08-14 DIAGNOSIS — G500 Trigeminal neuralgia: Secondary | ICD-10-CM | POA: Diagnosis present

## 2011-08-14 DIAGNOSIS — R627 Adult failure to thrive: Secondary | ICD-10-CM | POA: Diagnosis present

## 2011-08-14 DIAGNOSIS — G9340 Encephalopathy, unspecified: Secondary | ICD-10-CM | POA: Diagnosis not present

## 2011-08-14 DIAGNOSIS — Z87891 Personal history of nicotine dependence: Secondary | ICD-10-CM

## 2011-08-14 DIAGNOSIS — M549 Dorsalgia, unspecified: Secondary | ICD-10-CM

## 2011-08-14 DIAGNOSIS — J9690 Respiratory failure, unspecified, unspecified whether with hypoxia or hypercapnia: Secondary | ICD-10-CM

## 2011-08-14 DIAGNOSIS — N2889 Other specified disorders of kidney and ureter: Secondary | ICD-10-CM

## 2011-08-14 DIAGNOSIS — R0689 Other abnormalities of breathing: Secondary | ICD-10-CM

## 2011-08-14 DIAGNOSIS — K259 Gastric ulcer, unspecified as acute or chronic, without hemorrhage or perforation: Secondary | ICD-10-CM | POA: Diagnosis present

## 2011-08-14 DIAGNOSIS — R5381 Other malaise: Secondary | ICD-10-CM | POA: Diagnosis present

## 2011-08-14 DIAGNOSIS — Z8719 Personal history of other diseases of the digestive system: Secondary | ICD-10-CM | POA: Diagnosis present

## 2011-08-14 DIAGNOSIS — IMO0001 Reserved for inherently not codable concepts without codable children: Secondary | ICD-10-CM | POA: Diagnosis present

## 2011-08-14 DIAGNOSIS — D62 Acute posthemorrhagic anemia: Secondary | ICD-10-CM

## 2011-08-14 DIAGNOSIS — M810 Age-related osteoporosis without current pathological fracture: Secondary | ICD-10-CM | POA: Diagnosis present

## 2011-08-14 DIAGNOSIS — F3289 Other specified depressive episodes: Secondary | ICD-10-CM

## 2011-08-14 DIAGNOSIS — N390 Urinary tract infection, site not specified: Secondary | ICD-10-CM

## 2011-08-14 DIAGNOSIS — K922 Gastrointestinal hemorrhage, unspecified: Secondary | ICD-10-CM

## 2011-08-14 DIAGNOSIS — I4819 Other persistent atrial fibrillation: Secondary | ICD-10-CM

## 2011-08-14 DIAGNOSIS — F411 Generalized anxiety disorder: Secondary | ICD-10-CM

## 2011-08-14 DIAGNOSIS — E871 Hypo-osmolality and hyponatremia: Secondary | ICD-10-CM | POA: Diagnosis present

## 2011-08-14 DIAGNOSIS — I4891 Unspecified atrial fibrillation: Secondary | ICD-10-CM

## 2011-08-14 DIAGNOSIS — N289 Disorder of kidney and ureter, unspecified: Secondary | ICD-10-CM | POA: Diagnosis present

## 2011-08-14 HISTORY — DX: Chronic kidney disease, unspecified: N18.9

## 2011-08-14 LAB — DIFFERENTIAL
Basophils Relative: 0 % (ref 0–1)
Lymphs Abs: 1 10*3/uL (ref 0.7–4.0)
Monocytes Absolute: 0.3 10*3/uL (ref 0.1–1.0)
Monocytes Relative: 3 % (ref 3–12)
Neutro Abs: 7.9 10*3/uL — ABNORMAL HIGH (ref 1.7–7.7)
Neutrophils Relative %: 86 % — ABNORMAL HIGH (ref 43–77)

## 2011-08-14 LAB — URINE MICROSCOPIC-ADD ON

## 2011-08-14 LAB — CBC
MCV: 92.4 fL (ref 78.0–100.0)
Platelets: 251 10*3/uL (ref 150–400)
RDW: 12.8 % (ref 11.5–15.5)
WBC: 9.2 10*3/uL (ref 4.0–10.5)

## 2011-08-14 LAB — URINALYSIS, ROUTINE W REFLEX MICROSCOPIC
Ketones, ur: NEGATIVE mg/dL
Nitrite: POSITIVE — AB
Protein, ur: 30 mg/dL — AB
Urobilinogen, UA: 0.2 mg/dL (ref 0.0–1.0)

## 2011-08-14 MED ORDER — FUROSEMIDE 10 MG/ML IJ SOLN
20.0000 mg | Freq: Once | INTRAMUSCULAR | Status: AC
  Administered 2011-08-14: 20 mg via INTRAVENOUS
  Filled 2011-08-14: qty 2

## 2011-08-14 NOTE — ED Notes (Signed)
Per ems- pt dx with pneumonia 2 wks ago. Pt was not progressing so pt moved over to rehab facility. Pt has become increasingly lethargic and weak. Pt o2 today were 81% on RA. Pt placed on NRB mask pt went to 99%. Pt would respond initially to loud voice. Currently pt will arouse to loud voice/pain. No unilateral weakness or facial droop noted. Pt oriented to self. Pt baseline is lethargic, but today is increasingly so. 20g IV LFA.

## 2011-08-14 NOTE — ED Provider Notes (Signed)
History     CSN: [id number]  Arrival date & time 08/14/11  2309   First MD Initiated Contact with Patient 08/14/11 2312      Chief Complaint  Patient presents with  . Fatigue  . Shortness of Breath    (Consider location/radiation/quality/duration/timing/severity/associated sxs/prior treatment) HPI Pt is a 78 yo F recently admitted for upper GI bleed and renal mass. Pt was d/c to rehab several weeks ago and developed pneumonia which was treated. Since pt has become increasingly lethargic and now with increasing work of breathing and noted hypoxia to 81%. Family at bedside and states she was agitated prior to being transferred and was given ativan by staff. Pt is lethargic and not answering questions Past Medical History  Diagnosis Date  . Osteoporosis   . Hypertension   . Hypothyroidism   . Lower GI bleeding 07/21/11  . Atrial fibrillation   . COPD (chronic obstructive pulmonary disease)   . Pneumonia   . Shortness of breath 07/21/11    "anytime"  . Sinus headache 07/21/11    "all the time"  . Bladder infection, chronic   . Arthritis     "everywhere"  . Anxiety   . Chronic lower back pain   . Chronic abdominal pain   . Renal mass, left 2013  . PUD (peptic ulcer disease)   . Chronic cystitis   . Trigeminal neuralgia     /E-chart    Past Surgical History  Procedure Date  . Vaginal hysterectomy     partial  . Cataract extraction w/ intraocular lens  implant, bilateral   . Percutaneous pinning femoral neck fracture 11/2003    w/closed reduction ; left/E-chart  . Laceration repair 11/2003    left eyebrow  . Bilateral salpingoophorectomy 12/2005    lap/E-chart  . Cholecystectomy 1960  . Appendectomy     presumed/E-chart  . Esophagogastroduodenoscopy 07/23/2011    Procedure: ESOPHAGOGASTRODUODENOSCOPY (EGD);  Surgeon: Graylin Shiver, MD;  Location: [facility] ENDOSCOPY;  Service: Endoscopy;  Laterality: N/A;    History reviewed. No pertinent family history.  History    Substance Use Topics  . Smoking status: Former Smoker    Types: Cigarettes  . Smokeless tobacco: Never Used   Comment: 07/21/11 "can't remember when I quit smoking"  . Alcohol Use: Yes     07/21/11 "seldom drink"    OB History    Grav Para Term Preterm Abortions TAB SAB Ect Mult Living                  Review of Systems  Unable to perform ROS: Mental status change  Constitutional: Positive for fatigue.  Respiratory: Positive for shortness of breath.   Cardiovascular: Positive for leg swelling.    Allergies  Levofloxacin  Home Medications   No current outpatient prescriptions on file.  BP 109/82  Pulse 75  Temp(Src) 97.5 F (36.4 C) (Oral)  Resp 20  Ht 5\' 2"  (1.575 m)  Wt 142 lb 13.7 oz (64.8 kg)  BMI 26.13 kg/m2  SpO2 97%  Physical Exam  Nursing note and vitals reviewed. Constitutional: She appears well-developed and well-nourished. No distress.  HENT:  Head: Normocephalic and atraumatic.       Dry MM  Eyes: EOM are normal. Pupils are equal, round, and reactive to light.  Neck: Normal range of motion. Neck supple.  Cardiovascular: Normal rate.        irreg irreg  Pulmonary/Chest: No respiratory distress. She has no wheezes. She has no rales.  Increased effort with abdominal breathing and supraclavicular retractions. Rhonchi throughout  Abdominal: Soft. Bowel sounds are normal. There is no tenderness. There is no rebound and no guarding.  Musculoskeletal: Normal range of motion. She exhibits edema (Mild bl non pitting edme to lower ext). She exhibits no tenderness.  Neurological:       Pt will open eyes to voice and follow simple commands without focal deficits  Skin: Skin is warm and dry. No rash noted. No erythema.    ED Course  INTUBATION Date/Time: 08/15/2011 2:25 AM Performed by: Loren Racer Authorized by: Loren Racer Consent: Verbal consent not obtained. The procedure was performed in an emergent situation. Indications: respiratory  failure and hypercapnia Intubation method: video-assisted Patient status: sedated Preoxygenation: BVM Sedatives: etomidate Paralytic: succinylcholine Laryngoscope size: Mac 3 Tube size: 7.0 mm Tube type: cuffed Number of attempts: 2 Ventilation between attempts: BVM Cricoid pressure: yes Cords visualized: yes Post-procedure assessment: chest rise,  ETCO2 monitor and CO2 detector Breath sounds: equal and absent over the epigastrium Cuff inflated: yes ETT to lip: 22 cm Tube secured with: ETT holder Patient tolerance: Patient tolerated the procedure well with no immediate complications.   (including critical care time)  Labs Reviewed  CBC - Abnormal; Notable for the following:    RBC 3.81 (*)    Hemoglobin 10.9 (*)    HCT 35.2 (*)    All other components within normal limits  DIFFERENTIAL - Abnormal; Notable for the following:    Neutrophils Relative 86 (*)    Neutro Abs 7.9 (*)    Lymphocytes Relative 11 (*)    All other components within normal limits  COMPREHENSIVE METABOLIC PANEL - Abnormal; Notable for the following:    Sodium 132 (*)    Chloride 90 (*)    CO2 41 (*)    Glucose, Bld 134 (*)    Creatinine, Ser 0.46 (*)    Albumin 3.2 (*)    Total Bilirubin 0.2 (*)    All other components within normal limits  URINALYSIS, ROUTINE W REFLEX MICROSCOPIC - Abnormal; Notable for the following:    Hgb urine dipstick SMALL (*)    Protein, ur 30 (*)    Nitrite POSITIVE (*)    Leukocytes, UA TRACE (*)    All other components within normal limits  PRO B NATRIURETIC PEPTIDE - Abnormal; Notable for the following:    Pro B Natriuretic peptide (BNP) 1968.0 (*)    All other components within normal limits  URINE MICROSCOPIC-ADD ON - Abnormal; Notable for the following:    Bacteria, UA MANY (*)    Casts HYALINE CASTS (*)    All other components within normal limits  POCT I-STAT 3, BLOOD GAS (G3+) - Abnormal; Notable for the following:    pH, Arterial 7.148 (*)    pCO2  arterial 129.6 (*)    pO2, Arterial 141.0 (*)    Bicarbonate 45.0 (*)    Acid-Base Excess 11.0 (*)    All other components within normal limits  POCT I-STAT 3, BLOOD GAS (G3+) - Abnormal; Notable for the following:    pH, Arterial 7.196 (*)    pCO2 arterial 111.2 (*)    pO2, Arterial 73.0 (*)    Bicarbonate 43.1 (*)    Acid-Base Excess 11.0 (*)    All other components within normal limits  CBC - Abnormal; Notable for the following:    WBC 14.6 (*)    RBC 3.71 (*)    Hemoglobin 10.9 (*)    HCT  33.9 (*)    All other components within normal limits  BASIC METABOLIC PANEL - Abnormal; Notable for the following:    Sodium 133 (*)    Chloride 91 (*)    CO2 35 (*)    Glucose, Bld 123 (*)    GFR calc non Af Amer 90 (*)    All other components within normal limits  POCT I-STAT 3, BLOOD GAS (G3+) - Abnormal; Notable for the following:    pH, Arterial 7.434 (*)    pCO2 arterial 56.6 (*)    pO2, Arterial 142.0 (*)    Bicarbonate 37.9 (*)    Acid-Base Excess 12.0 (*)    All other components within normal limits  PROTIME-INR  APTT  LACTIC ACID, PLASMA  POCT I-STAT TROPONIN I  MAGNESIUM  PHOSPHORUS  CULTURE, BLOOD (ROUTINE X 2)  CULTURE, BLOOD (ROUTINE X 2)  URINE CULTURE  CULTURE, RESPIRATORY   Dg Chest Portable 1 View  08/15/2011  *RADIOLOGY REPORT*  Clinical Data: Endotracheal tube placement  PORTABLE CHEST - 1 VIEW  Comparison: 08/14/2011  Findings: Interval placement of endotracheal tube with tip about 2.9 cm above the carina.  An enteric tube has been placed with tip in the upper stomach just below the esophagogastric junction. Normal heart size and pulmonary vascularity.  Emphysematous changes and scattered fibrosis in the lungs.  Calcification of the aorta. No blunting of costophrenic angles.  No pneumothorax.  IMPRESSION: Endotracheal tube tip is about 2.9 cm above the carina.  Enteric tube tip appears to be in the upper stomach just below the esophagogastric junction.  Otherwise  stable appearance of the chest.  Original Report Authenticated By: Marlon Pel, M.D.   Dg Chest Port 1 View  08/15/2011  *RADIOLOGY REPORT*  Clinical Data: Shortness of breath, increased lethargy  PORTABLE CHEST - 1 VIEW  Comparison: 07/22/2009  Findings: Cardiomegaly with mild interstitial edema and probable small bilateral pleural effusions.  Left lower lobe opacity, likely atelectasis, pneumonia not excluded.  No pneumothorax.  IMPRESSION: Cardiomegaly with mild interstitial edema and probable small bilateral pleural effusions.  Associated left lower lobe opacity, likely atelectasis, pneumonia not excluded.  Original Report Authenticated By: Charline Bills, M.D.     1. Respiratory failure   2. Hypercapnia   3. UTI (lower urinary tract infection)      Date: 08/14/2011  Rate: 81  Rhythm: atrial fibrillation  QRS Axis: normal  Intervals: normal  ST/T Wave abnormalities: nonspecific ST changes  Conduction Disutrbances:none  Narrative Interpretation:   Old EKG Reviewed: changes noted  CRITICAL CARE Performed by: Ranae Palms, Rogelio Winbush   Total critical care time:60 Critical care time was exclusive of separately billable procedures and treating other patients.  Critical care was necessary to treat or prevent imminent or life-threatening deterioration.  Critical care was time spent personally by me on the following activities: development of treatment plan with patient and/or surrogate as well as nursing, discussions with consultants, evaluation of patient's response to treatment, examination of patient, obtaining history from patient or surrogate, ordering and performing treatments and interventions, ordering and review of laboratory studies, ordering and review of radiographic studies, pulse oximetry and re-evaluation of patient's condition.    MDM   Discussed with intensivist who advises against CTA. Will see in ED       Loren Racer, MD 08/15/11 [phone]

## 2011-08-15 ENCOUNTER — Emergency Department (HOSPITAL_COMMUNITY): Payer: Medicare Other

## 2011-08-15 ENCOUNTER — Encounter (HOSPITAL_COMMUNITY): Payer: Self-pay | Admitting: *Deleted

## 2011-08-15 ENCOUNTER — Inpatient Hospital Stay (HOSPITAL_COMMUNITY): Payer: Medicare Other

## 2011-08-15 DIAGNOSIS — N390 Urinary tract infection, site not specified: Secondary | ICD-10-CM

## 2011-08-15 DIAGNOSIS — J9600 Acute respiratory failure, unspecified whether with hypoxia or hypercapnia: Secondary | ICD-10-CM

## 2011-08-15 DIAGNOSIS — I059 Rheumatic mitral valve disease, unspecified: Secondary | ICD-10-CM

## 2011-08-15 DIAGNOSIS — J189 Pneumonia, unspecified organism: Secondary | ICD-10-CM

## 2011-08-15 DIAGNOSIS — R0602 Shortness of breath: Secondary | ICD-10-CM

## 2011-08-15 DIAGNOSIS — J441 Chronic obstructive pulmonary disease with (acute) exacerbation: Secondary | ICD-10-CM

## 2011-08-15 LAB — CBC
HCT: 33.9 % — ABNORMAL LOW (ref 36.0–46.0)
Platelets: 228 10*3/uL (ref 150–400)
RDW: 12.9 % (ref 11.5–15.5)
WBC: 14.6 10*3/uL — ABNORMAL HIGH (ref 4.0–10.5)

## 2011-08-15 LAB — POCT I-STAT 3, ART BLOOD GAS (G3+)
Acid-Base Excess: 11 mmol/L — ABNORMAL HIGH (ref 0.0–2.0)
Bicarbonate: 43.1 mEq/L — ABNORMAL HIGH (ref 20.0–24.0)
O2 Saturation: 98 %
TCO2: 46 mmol/L (ref 0–100)
TCO2: 49 mmol/L (ref 0–100)
pCO2 arterial: 129.6 mmHg (ref 35.0–45.0)
pCO2 arterial: 56.6 mmHg — ABNORMAL HIGH (ref 35.0–45.0)
pH, Arterial: 7.434 — ABNORMAL HIGH (ref 7.350–7.400)
pO2, Arterial: 141 mmHg — ABNORMAL HIGH (ref 80.0–100.0)
pO2, Arterial: 142 mmHg — ABNORMAL HIGH (ref 80.0–100.0)
pO2, Arterial: 73 mmHg — ABNORMAL LOW (ref 80.0–100.0)

## 2011-08-15 LAB — COMPREHENSIVE METABOLIC PANEL
Alkaline Phosphatase: 52 U/L (ref 39–117)
BUN: 18 mg/dL (ref 6–23)
Chloride: 90 mEq/L — ABNORMAL LOW (ref 96–112)
Creatinine, Ser: 0.46 mg/dL — ABNORMAL LOW (ref 0.50–1.10)
GFR calc Af Amer: >90 mL/min (ref >90)
GFR calc non Af Amer: >90 mL/min (ref >90)
Glucose, Bld: 134 mg/dL — ABNORMAL HIGH (ref 70–99)
Potassium: 5.1 mEq/L (ref 3.5–5.1)
Total Bilirubin: 0.2 mg/dL — ABNORMAL LOW (ref 0.3–1.2)

## 2011-08-15 LAB — MRSA PCR SCREENING: MRSA by PCR: NEGATIVE

## 2011-08-15 LAB — BASIC METABOLIC PANEL
BUN: 18 mg/dL (ref 6–23)
Chloride: 91 mEq/L — ABNORMAL LOW (ref 96–112)
GFR calc Af Amer: >90 mL/min (ref >90)
Potassium: 4.8 mEq/L (ref 3.5–5.1)

## 2011-08-15 LAB — PROTIME-INR
INR: 0.96 (ref 0.00–1.49)
Prothrombin Time: 13 seconds (ref 11.6–15.2)

## 2011-08-15 LAB — POCT I-STAT TROPONIN I: Troponin i, poc: 0.01 ng/mL (ref 0.00–0.08)

## 2011-08-15 LAB — GLUCOSE, CAPILLARY: Glucose-Capillary: 145 mg/dL — ABNORMAL HIGH (ref 70–99)

## 2011-08-15 LAB — MAGNESIUM: Magnesium: 2 mg/dL (ref 1.5–2.5)

## 2011-08-15 LAB — LACTIC ACID, PLASMA: Lactic Acid, Venous: 0.6 mmol/L (ref 0.5–2.2)

## 2011-08-15 MED ORDER — VANCOMYCIN HCL 500 MG IV SOLR
500.0000 mg | Freq: Two times a day (BID) | INTRAVENOUS | Status: DC
  Administered 2011-08-15 – 2011-08-16 (×4): 500 mg via INTRAVENOUS
  Filled 2011-08-15 (×5): qty 500

## 2011-08-15 MED ORDER — LIDOCAINE HCL (CARDIAC) 20 MG/ML IV SOLN
INTRAVENOUS | Status: AC
  Filled 2011-08-15: qty 5

## 2011-08-15 MED ORDER — MORPHINE SULFATE 2 MG/ML IJ SOLN
2.0000 mg | INTRAMUSCULAR | Status: DC | PRN
Start: 2011-08-15 — End: 2011-08-16
  Administered 2011-08-15 – 2011-08-16 (×5): 2 mg via INTRAVENOUS
  Filled 2011-08-15 (×5): qty 1

## 2011-08-15 MED ORDER — HEPARIN SODIUM (PORCINE) 5000 UNIT/ML IJ SOLN: 5000.0000 [IU] | Freq: Three times a day (TID) | INTRAMUSCULAR | Status: DC

## 2011-08-15 MED ORDER — SODIUM CHLORIDE 0.9 % IV BOLUS (SEPSIS)
250.0000 mL | Freq: Once | INTRAVENOUS | Status: AC
  Administered 2011-08-15: 250 mL via INTRAVENOUS

## 2011-08-15 MED ORDER — SUCCINYLCHOLINE CHLORIDE 20 MG/ML IJ SOLN
INTRAMUSCULAR | Status: AC
  Administered 2011-08-15: 100 mg via INTRAVENOUS
  Filled 2011-08-15: qty 10

## 2011-08-15 MED ORDER — METHYLPREDNISOLONE SODIUM SUCC 125 MG IJ SOLR
60.0000 mg | Freq: Three times a day (TID) | INTRAMUSCULAR | Status: DC
  Administered 2011-08-15 – 2011-08-16 (×3): 60 mg via INTRAVENOUS
  Filled 2011-08-15 (×3): qty 0.96
  Filled 2011-08-15: qty 2
  Filled 2011-08-15: qty 0.96

## 2011-08-15 MED ORDER — METHYLPREDNISOLONE SODIUM SUCC 125 MG IJ SOLR
125.0000 mg | Freq: Once | INTRAMUSCULAR | Status: AC
  Administered 2011-08-15: 125 mg via INTRAVENOUS
  Filled 2011-08-15: qty 2

## 2011-08-15 MED ORDER — PANTOPRAZOLE SODIUM 40 MG IV SOLR
40.0000 mg | Freq: Every day | INTRAVENOUS | Status: DC
  Administered 2011-08-15: 40 mg via INTRAVENOUS
  Filled 2011-08-15 (×2): qty 40

## 2011-08-15 MED ORDER — ALBUTEROL SULFATE HFA 108 (90 BASE) MCG/ACT IN AERS
4.0000 | INHALATION_SPRAY | RESPIRATORY_TRACT | Status: DC | PRN
  Filled 2011-08-15: qty 6.7

## 2011-08-15 MED ORDER — IPRATROPIUM-ALBUTEROL 18-103 MCG/ACT IN AERO
4.0000 | INHALATION_SPRAY | Freq: Four times a day (QID) | RESPIRATORY_TRACT | Status: DC
  Administered 2011-08-15 – 2011-08-16 (×5): 4 via RESPIRATORY_TRACT
  Filled 2011-08-15: qty 14.7

## 2011-08-15 MED ORDER — SUCCINYLCHOLINE CHLORIDE 20 MG/ML IJ SOLN
25.0000 mg | Freq: Once | INTRAMUSCULAR | Status: AC
  Administered 2011-08-15: 25 mg via INTRAVENOUS

## 2011-08-15 MED ORDER — ETOMIDATE 2 MG/ML IV SOLN
20.0000 mg | Freq: Once | INTRAVENOUS | Status: AC
  Administered 2011-08-15: 20 mg via INTRAVENOUS

## 2011-08-15 MED ORDER — ROCURONIUM BROMIDE 50 MG/5ML IV SOLN
INTRAVENOUS | Status: AC
  Filled 2011-08-15: qty 2

## 2011-08-15 MED ORDER — CHLORHEXIDINE GLUCONATE 0.12 % MT SOLN
OROMUCOSAL | Status: AC
  Administered 2011-08-15: 15 mL via OROMUCOSAL
  Filled 2011-08-15: qty 15

## 2011-08-15 MED ORDER — PROPOFOL 10 MG/ML IV EMUL
5.0000 ug/kg/min | INTRAVENOUS | Status: DC
  Administered 2011-08-15: 20 ug/kg/min via INTRAVENOUS
  Filled 2011-08-15 (×2): qty 100

## 2011-08-15 MED ORDER — PIPERACILLIN-TAZOBACTAM 3.375 G IVPB
3.3750 g | Freq: Once | INTRAVENOUS | Status: AC
  Administered 2011-08-15: 3.375 g via INTRAVENOUS
  Filled 2011-08-15: qty 50

## 2011-08-15 MED ORDER — MIDAZOLAM HCL 2 MG/2ML IJ SOLN
INTRAMUSCULAR | Status: AC
  Administered 2011-08-15: 1 mg via INTRAVENOUS
  Filled 2011-08-15: qty 2

## 2011-08-15 MED ORDER — MORPHINE SULFATE 2 MG/ML IJ SOLN
INTRAMUSCULAR | Status: AC
  Administered 2011-08-15: 2 mg via INTRAVENOUS
  Filled 2011-08-15: qty 1

## 2011-08-15 MED ORDER — SUCCINYLCHOLINE CHLORIDE 20 MG/ML IJ SOLN
100.0000 mg | Freq: Once | INTRAMUSCULAR | Status: AC
  Administered 2011-08-15: 100 mg via INTRAVENOUS

## 2011-08-15 MED ORDER — MIDAZOLAM HCL 2 MG/2ML IJ SOLN
1.0000 mg | Freq: Once | INTRAMUSCULAR | Status: AC
  Administered 2011-08-15: 1 mg via INTRAVENOUS

## 2011-08-15 MED ORDER — VANCOMYCIN HCL IN DEXTROSE 1-5 GM/200ML-% IV SOLN
1000.0000 mg | Freq: Once | INTRAVENOUS | Status: AC
  Administered 2011-08-15: 1000 mg via INTRAVENOUS
  Filled 2011-08-15: qty 200

## 2011-08-15 MED ORDER — ETOMIDATE 2 MG/ML IV SOLN
INTRAVENOUS | Status: AC
  Administered 2011-08-15: 20 mg via INTRAVENOUS
  Filled 2011-08-15: qty 20

## 2011-08-15 MED ORDER — PROPOFOL 10 MG/ML IV EMUL
5.0000 ug/kg/min | INTRAVENOUS | Status: DC
  Administered 2011-08-15: 5 ug/kg/min via INTRAVENOUS
  Filled 2011-08-15: qty 100

## 2011-08-15 MED ORDER — SODIUM CHLORIDE 0.9 % IV SOLN
250.0000 mL | INTRAVENOUS | Status: DC | PRN
  Administered 2011-08-15: 22:00:00 via INTRAVENOUS

## 2011-08-15 MED ORDER — PIPERACILLIN-TAZOBACTAM 3.375 G IVPB
3.3750 g | Freq: Three times a day (TID) | INTRAVENOUS | Status: DC
  Administered 2011-08-15 – 2011-08-18 (×10): 3.375 g via INTRAVENOUS
  Filled 2011-08-15 (×11): qty 50

## 2011-08-15 MED ORDER — ALBUTEROL SULFATE HFA 108 (90 BASE) MCG/ACT IN AERS
4.0000 | INHALATION_SPRAY | Freq: Four times a day (QID) | RESPIRATORY_TRACT | Status: DC
  Administered 2011-08-15: 4 via RESPIRATORY_TRACT
  Filled 2011-08-15: qty 6.7

## 2011-08-15 MED ORDER — IPRATROPIUM BROMIDE HFA 17 MCG/ACT IN AERS
4.0000 | INHALATION_SPRAY | Freq: Four times a day (QID) | RESPIRATORY_TRACT | Status: DC
  Filled 2011-08-15: qty 12.9

## 2011-08-15 NOTE — ED Notes (Signed)
Respiratory, Dr. Ranae Palms, Antionette Fairy, and [facility] RN at bedside for intubation.

## 2011-08-15 NOTE — Progress Notes (Signed)
VASCULAR LAB PRELIMINARY  PRELIMINARY  PRELIMINARY  PRELIMINARY  Bilateral venous Doppelrs completed.    Preliminary report:  There is no obvious evidence of DVT or SVT noted in the bilateral lower extremities.  Natalie [name] [place], 08/15/2011, 9:47 AM

## 2011-08-15 NOTE — Progress Notes (Signed)
ANTIBIOTIC CONSULT NOTE - INITIAL  Pharmacy Consult for Vancocin/Zosyn Indication: rule out pneumonia  Allergies  Allergen Reactions  . Levofloxacin Other (See Comments)    REACTION: unspecified per [facility]    Patient Measurements: Height: 5\' 2"  (157.5 cm) Weight: 142 lb 13.7 oz (64.8 kg) IBW/kg (Calculated) : 50.1   Vital Signs: Temp: 97.5 F (36.4 C) (05/24 2312) Temp src: Oral (05/24 2312) BP: 102/74 mmHg (05/25 0245) Pulse Rate: 83  (05/25 0241)  Labs:  Basename 08/14/11 2345  WBC 9.2  HGB 10.9*  PLT 251  LABCREA --  CREATININE 0.46*   Estimated Creatinine Clearance: 51.2 ml/min (by C-G formula based on Cr of 0.46).  Microbiology: Recent Results (from the past 720 hour(s))  URINE CULTURE     Status: Normal   Collection Time   07/21/11  2:32 PM      Component Value Range Status Comment   Specimen Description URINE, CLEAN CATCH   Final    Special Requests ADDED ON [id number] @1954    Final    Culture  Setup Time [id number]   Final    Colony Count NO GROWTH   Final    Culture NO GROWTH   Final    Report Status 07/22/2011 FINAL   Final     Medical History: Past Medical History  Diagnosis Date  . Osteoporosis   . Hypertension   . Hypothyroidism   . Lower GI bleeding 07/21/11  . Atrial fibrillation   . COPD (chronic obstructive pulmonary disease)   . Pneumonia   . Shortness of breath 07/21/11    "anytime"  . Sinus headache 07/21/11    "all the time"  . Bladder infection, chronic   . Arthritis     "everywhere"  . Anxiety   . Chronic lower back pain   . Chronic abdominal pain   . Renal mass, left 2013  . PUD (peptic ulcer disease)   . Chronic cystitis   . Trigeminal neuralgia     /E-chart    Assessment: 78yo female was Dx'd with PNA two weeks ago, no progression, has become increasingly lethargic and weak, CXR shows atelectasis vs PNA, to begin IV ABX.  Goal of Therapy:  Vancomycin trough level 15-20 mcg/ml  Plan:  Rec'd vanc 1g and Zosyn 3.375g IV  in ED; will continue with vancomycin 500mg  IV Q12H and Zosyn 3.375g IV Q8H and monitor CBC, Cx, levels prn.  Colleen Can PharmD BCPS 08/15/2011,4:54 AM

## 2011-08-15 NOTE — Progress Notes (Signed)
  Echocardiogram 2D Echocardiogram has been performed.  Natalie [name] 08/15/2011, 5:27 PM

## 2011-08-15 NOTE — ED Notes (Signed)
Respiratory at bedside to complete 2nd ABG

## 2011-08-15 NOTE — Progress Notes (Addendum)
Name: Natalie [name] MRN: [id number] DOB: 21-May-1930    LOS: 1   PULMONARY / CRITICAL CARE MEDICINE  HPI:  Patient is a 78 y/o F with extensive PMHx including afib in the past on anticog that has to be stopped dure to GI bleeding with gastric ulcers, HTN, anxiety, depression who was brought to [facility] from the rehab facility due to SOB and lethargy and was found to have ABG with PH 7.1 and PCO2 129 and was intubated and started on mech ventilation.  As per medical records and her daughter, pt was admitted to [facility] on 07/21/11 due to GI bleeding and she was found to have gastric ulcers while on anticoag due to chronic afib. Pt was discharged to a rehab facilty due to deconditioning and at the rehab facility developed pneumonia, treated with antibiotics. As per daughter, pt has been complaining fo SOB and getting Benzos and Vicodin for pain and anxiety. Of note, pt has been less active at the rehab facilty and CT abdomen showed a renal mass during her last hospital admission. Today in the ER, ABG showed ph 7.1 with PCO2 of 129. Pt was started on BIPAP and repeat ABG showed 7.1 PCO2 111. Pt was intubated and CCM service was consulted for further management.    ASSESSMENT AND PLAN  PULMONARY  Lab 08/15/11 0527 08/15/11 0130 08/15/11 0015  PHART 7.434* 7.196* 7.148*  PCO2ART 56.6* 111.2* 129.6*  PO2ART 142.0* 73.0* 141.0*  HCO3 37.9* 43.1* 45.0*  O2SAT 99.0 88.0 98.0   Ventilator Settings: Vent Mode:  [-] PRVC FiO2 (%):  [30 %-40.9 %] 30 % Set Rate:  [20 bmp] 20 bmp Vt Set:  [360 mL] 360 mL PEEP:  [5 cmH20] 5 cmH20 Plateau Pressure:  [15 cmH20-18 cmH20] 18 cmH20  A:  Acute Respiratory failure - Due benzos and opioids (hypercapnic resp failure) Hx of recent pneumonia, possible COPD with heavy smoking Hx P:   - Given Vancomycin and Zosyn in the ER - Resp cultures - CXR with hyperinflation, low lobe infiltrates, also r/o aspiration - Increased airway resistance on the ventilator (peak  P-Plat P= about 20). 5/25 1400 increased Vt to 8cc/kg. Plat 16. No role for low stretch in this patient. Put on  q8h steroids.  - Albuterol/Ipratropium - Follow CXR and ABG - Rule out DVT: low extrem US/doppler(neg 5/25)  CARDIOVASCULAR  Lab 08/14/11 2345  TROPONINI --  LATICACIDVEN 0.6  PROBNP 1968.0*    A: A fib P: HR stable - BP stable - No anticaog due to Hx of GI bleeding  RENAL  Lab 08/15/11 0645 08/14/11 2345  NA 133* 132*  K 4.8 5.1  CL 91* 90*  CO2 35* 41*  BUN 18 18  CREATININE 0.51 0.46*  CALCIUM 9.1 9.3  MG 2.0 --  PHOS 2.9 --   Intake/Output      05/24 0701 - 05/25 0700 05/25 0701 - 05/26 0700   NG/GT  20   IV Piggyback  50   Total Intake(mL/kg)  70 (1.1)   Urine (mL/kg/hr)  165 (0.3)   Emesis/NG output 20    Total Output 20 165   Net -20 -95         Foley:  In place  A:  Hyponatremia - Hx of renal mass P:  Likely dehydratio/ intravasc vol depletion - Gentle hydration - Rule out Renal cell ca - Will discuss for possible IR biopsy   HEMATOLOGIC  Lab 08/15/11 0645 08/14/11 2345  HGB 10.9* 10.9*  HCT 33.9* 35.2*  PLT 228 251  INR -- 0.96  APTT -- 24   A:  Anemia P:  - Hx of GI bleed - Likely combination of chronic disease - No active bleeding - Protonix for GI prophylaxis   INFECTIOUS  Lab 08/15/11 0645 08/14/11 2345  WBC 14.6* 9.2  PROCALCITON -- --   Cultures: Respiratory cultures pending Antibiotics: Vancomycin/Zosyn 05/25  A:  Possible pneumonia HCAP vs UTI P:   5/25resp cultures pending>> -5/25 Urine cult pending>>   NEUROLOGIC  A:  Sedated P:  On propofol and Morphine PRN - Watch for Benzo and opioid withdrawal  BEST PRACTICE / DISPOSITION - Level of Care:  ICU - Primary Service: PCCM - Code Status:  Full code - Diet:  NPO - DVT Px:  SCDs - GI Px:  Protonix - Social / Family:    Brett Canales Minor ACNP Adolph Pollack PCCM Pager [phone] till 3 pm If no answer page [phone] 08/15/2011, 2:26 PM  Reviewed above,  examined pt, and agree with assessment/plan.  She likely has acute hypoxic/hypercapnic respiratory failure from AECOPD.  Vent settings adjust to avoid PEEPi.  Will add solumedrol.  Continue combivent and Abx.  Critical care time 35 minutes.  Coralyn Helling, MD 08/15/2011, 3:36 PM Pager:  [phone]

## 2011-08-15 NOTE — ED Notes (Signed)
Called pharmacy regarding the need of Propafol STAT. Will continue to monitor

## 2011-08-15 NOTE — Progress Notes (Signed)
At 21:00 BP decreased to 74/43 (49).  Propofol dose decreased.  BP 107/70 (78) at 21:15.  Will continue to monitor.

## 2011-08-15 NOTE — ED Notes (Signed)
Radiology at bedside to complete portable Chest Xray

## 2011-08-15 NOTE — ED Notes (Signed)
Respiratory at bedside.

## 2011-08-15 NOTE — ED Notes (Signed)
Critical Care MD at bedside with pt and family.

## 2011-08-15 NOTE — ED Notes (Signed)
Respiratory made aware of the need for a new ABG per Intensivist order. Will continue to monitor.

## 2011-08-15 NOTE — ED Notes (Signed)
Bag Mask, glidescope, and respiratory kit at bedside. Dr. Ranae Palms performing intubation

## 2011-08-15 NOTE — ED Notes (Signed)
Dr. Yelverton made aware of pt's BP. 

## 2011-08-15 NOTE — H&P (Signed)
Name: Natalie [name] MRN: [id number] DOB: 08-14-1930    LOS: 1   PULMONARY / CRITICAL CARE MEDICINE  HPI:  Patient is a 78 y/o F with extensive PMHx including afib in the past on anticog that has to be stopped dure to GI bleeding with gastric ulcers, HTN, anxiety, depression who was brought to [facility] from the rehab facility due to SOB and lethargy and was found to have ABG with PH 7.1 and PCO2 129 and was intubated and started on mech ventilation.  As per medical records and her daughter, pt was admitted to [facility] on 07/21/11 due to GI bleeding and she was found to have gastric ulcers while on anticoag due to chronic afib. Pt was discharged to a rehab facilty due to deconditioning and at the rehab facility developed pneumonia, treated with antibiotics. As per daughter, pt has been complaining fo SOB and getting Benzos and Vicodin for pain and anxiety. Of note, pt has been less active at the rehab facilty and CT abdomen showed a renal mass during her last hospital admission. Today in the ER, ABG showed ph 7.1 with PCO2 of 129. Pt was started on BIPA and repeat ABG showed 7.1 PCO2 111. Pt was intubated and CCM service was consulted for further management.  Past Medical History  Diagnosis Date  . Osteoporosis   . Hypertension   . Hypothyroidism   . Lower GI bleeding 07/21/11  . Atrial fibrillation   . COPD (chronic obstructive pulmonary disease)   . Pneumonia   . Shortness of breath 07/21/11    "anytime"  . Sinus headache 07/21/11    "all the time"  . Bladder infection, chronic   . Arthritis     "everywhere"  . Anxiety   . Chronic lower back pain   . Chronic abdominal pain   . Renal mass, left 2013  . PUD (peptic ulcer disease)   . Chronic cystitis   . Trigeminal neuralgia     /E-chart   Past Surgical History  Procedure Date  . Vaginal hysterectomy     partial  . Cataract extraction w/ intraocular lens  implant, bilateral   . Percutaneous pinning femoral neck fracture 11/2003   w/closed reduction ; left/E-chart  . Laceration repair 11/2003    left eyebrow  . Bilateral salpingoophorectomy 12/2005    lap/E-chart  . Cholecystectomy 1960  . Appendectomy     presumed/E-chart  . Esophagogastroduodenoscopy 07/23/2011    Procedure: ESOPHAGOGASTRODUODENOSCOPY (EGD);  Surgeon: Graylin Shiver, MD;  Location: [facility] ENDOSCOPY;  Service: Endoscopy;  Laterality: N/A;   Prior to Admission medications   Medication Sig Start Date End Date Taking? Authorizing Provider  amLODipine (NORVASC) 2.5 MG tablet Take 2.5 mg by mouth daily.   Yes Historical Provider, MD  benazepril (LOTENSIN) 40 MG tablet Take 40 mg by mouth daily.     Yes Historical Provider, MD  bismuth subsalicylate (KAOPECTATE) 262 MG/15ML suspension Take 30 mLs by mouth every 6 (six) hours as needed. For diarrhea   Yes Historical Provider, MD  calcium citrate (CALCITRATE - DOSED IN MG ELEMENTAL CALCIUM) 950 MG tablet Take 1 tablet by mouth 2 (two) times daily.     Yes Historical Provider, MD  carvedilol (COREG) 12.5 MG tablet Take 12.5 mg by mouth 2 (two) times daily with a meal.     Yes Historical Provider, MD  dicyclomine (BENTYL) 10 MG capsule Take 10 mg by mouth 4 (four) times daily -  before meals and at bedtime.  Yes Historical Provider, MD  escitalopram (LEXAPRO) 10 MG tablet Take 10 mg by mouth every evening.   Yes Historical Provider, MD  HYDROcodone-acetaminophen (VICODIN) 5-500 MG per tablet Take 1 tablet by mouth daily as needed. For pain   Yes Historical Provider, MD  lansoprazole (PREVACID) 30 MG capsule Take 30 mg by mouth 2 (two) times daily before a meal.   Yes Historical Provider, MD  levothyroxine (SYNTHROID, LEVOTHROID) 75 MCG tablet Take 75 mcg by mouth daily.   Yes Historical Provider, MD  LORazepam (ATIVAN) 0.5 MG tablet Take 0.5 mg by mouth 2 (two) times daily.   Yes Historical Provider, MD  montelukast (SINGULAIR) 10 MG tablet Take 10 mg by mouth daily.   Yes Historical Provider, MD    Allergies Allergies  Allergen Reactions  . Levofloxacin Other (See Comments)    REACTION: unspecified per [facility]    Family History History reviewed. No pertinent family history. Social History  reports that she has quit smoking. Her smoking use included Cigarettes. She has never used smokeless tobacco. She reports that she drinks alcohol. She reports that she does not use illicit drugs.  Review Of Systems:  All other systems were negative except as above in HPI  Current Status:  Vital Signs: Temp:  [97.5 F (36.4 C)] 97.5 F (36.4 C) (05/24 2312) Pulse Rate:  [58-108] 83  (05/25 0241) Resp:  [13-26] 17  (05/25 0245) BP: (81-136)/(48-97) 102/74 mmHg (05/25 0245) SpO2:  [81 %-99 %] 94 % (05/25 0241) FiO2 (%):  [40 %] 40 % (05/25 0243) Weight:  [64.8 kg (142 lb 13.7 oz)] 64.8 kg (142 lb 13.7 oz) (05/25 0215)  Physical Examination: General: sedated, on mech ventilation, critically ill Neuro:  Sedated on propofol, follows simple commands HEENT: No JVD, ET tube in place Cardiovascular:  Irregular rhythm. s1 and s2 normal  Lungs:  Decreased breath sounds bilat with no wheezes, rhonchi or crackles. Abdomen: Soft, non tender, non distended. BS active Skin:  No rash    ASSESSMENT AND PLAN  PULMONARY  Lab 08/15/11 0130 08/15/11 0015  PHART 7.196* 7.148*  PCO2ART 111.2* 129.6*  PO2ART 73.0* 141.0*  HCO3 43.1* 45.0*  O2SAT 88.0 98.0   Ventilator Settings: Vent Mode:  [-] PRVC FiO2 (%):  [40 %] 40 % Set Rate:  [20 bmp] 20 bmp Vt Set:  [360 mL] 360 mL PEEP:  [5 cmH20] 5 cmH20  A:  Acute Respiratory failure - Due benzos and opioids (hypercapnic resp failure) Hx of recent pneumonia, possible COPD with heavy smoking Hx P:   - Given Vancomycin and Zosyn in the ER - Resp cultures - CXR with hyperinflation, low lobe infiltrates, also r/o aspiration - Increased airway resistance on the ventilator (peak P-Plat P= about 20) - Solumedrol 125 mg once - Albuterol/Ipratropium -  Follow CXR and ABG - Rule out DVT: low extrem US/doppler  CARDIOVASCULAR  Lab 08/14/11 2345  TROPONINI --  LATICACIDVEN 0.6  PROBNP 1968.0*    A: A fib P: HR stable - BP stable - No anticaog due to Hx of GI bleeding  RENAL  Lab 08/14/11 2345  NA 132*  K 5.1  CL 90*  CO2 41*  BUN 18  CREATININE 0.46*  CALCIUM 9.3  MG --  PHOS --   Intake/Output    None    Foley:  In place  A:  Hyponatremia - Hx of renal mass P:  Likely dehydratio/ intravasc vol depletion - Gentle hydration - Rule out Renal cell ca -  Will discuss for possible IR biopsy   HEMATOLOGIC  Lab 08/14/11 2345  HGB 10.9*  HCT 35.2*  PLT 251  INR 0.96  APTT 24   A:  Anemia P:  - Hx of GI bleed - Likely combination of chronic disease - No active bleeding - Protonix for GI prophylaxis   INFECTIOUS  Lab 08/14/11 2345  WBC 9.2  PROCALCITON --   Cultures: Respiratory cultures pending Antibiotics: Vancomycin/Zosyn 05/25  A:  Possible pneumonia HCAP vs UTI P:  resp cultures pending - Urine cult pending   NEUROLOGIC  A:  Sedated P:  On propofol and Morphine PRN - Watch for Benzo and opioid withdrawal  BEST PRACTICE / DISPOSITION - Level of Care:  ICU - Primary Service: PCCM - Code Status:  Full code - Diet:  NPO - DVT Px:  SCDs - GI Px:  Protonix - Social / Family:  Updated at the bedside  Alvester Eads, M.D. Pulmonary and Critical Care Medicine [facility] Tel: [phone]  08/15/2011, 4:46 AM

## 2011-08-15 NOTE — ED Notes (Signed)
Dr. Ranae Palms made aware of critical CO2 from lab.

## 2011-08-15 NOTE — ED Notes (Signed)
Family Contacts, Daughters:       Antony Odea  (c) [phone]    (h) [phone]                                                        [facility]           (c) [phone]    (h) [phone]

## 2011-08-15 NOTE — ED Notes (Signed)
Followed up with Bed control and flow management. The completing bed request now. Will continue to monitor.

## 2011-08-15 NOTE — ED Notes (Signed)
Dr Ranae Palms intubating pt  currently

## 2011-08-15 NOTE — ED Notes (Signed)
Respiratory called and made aware that we need ABG and BiPap on pt. Will continue to monitor.

## 2011-08-16 DIAGNOSIS — N289 Disorder of kidney and ureter, unspecified: Secondary | ICD-10-CM

## 2011-08-16 DIAGNOSIS — E039 Hypothyroidism, unspecified: Secondary | ICD-10-CM

## 2011-08-16 DIAGNOSIS — I10 Essential (primary) hypertension: Secondary | ICD-10-CM

## 2011-08-16 DIAGNOSIS — R0989 Other specified symptoms and signs involving the circulatory and respiratory systems: Secondary | ICD-10-CM

## 2011-08-16 DIAGNOSIS — R0609 Other forms of dyspnea: Secondary | ICD-10-CM

## 2011-08-16 DIAGNOSIS — J9600 Acute respiratory failure, unspecified whether with hypoxia or hypercapnia: Secondary | ICD-10-CM

## 2011-08-16 LAB — POCT I-STAT 3, ART BLOOD GAS (G3+)
O2 Saturation: 98 %
Patient temperature: 98.7
TCO2: 41 mmol/L (ref 0–100)
pH, Arterial: 7.529 — ABNORMAL HIGH (ref 7.350–7.400)

## 2011-08-16 LAB — CBC
Hemoglobin: 10.6 g/dL — ABNORMAL LOW (ref 12.0–15.0)
MCH: 28.8 pg (ref 26.0–34.0)
MCHC: 32.9 g/dL (ref 30.0–36.0)

## 2011-08-16 LAB — BASIC METABOLIC PANEL
BUN: 25 mg/dL — ABNORMAL HIGH (ref 6–23)
Creatinine, Ser: 0.56 mg/dL (ref 0.50–1.10)
GFR calc non Af Amer: 87 mL/min — ABNORMAL LOW (ref >90)
Glucose, Bld: 146 mg/dL — ABNORMAL HIGH (ref 70–99)
Potassium: 3.8 mEq/L (ref 3.5–5.1)

## 2011-08-16 MED ORDER — IPRATROPIUM BROMIDE 0.02 % IN SOLN
0.5000 mg | Freq: Four times a day (QID) | RESPIRATORY_TRACT | Status: DC
  Administered 2011-08-16 – 2011-08-22 (×6): 0.5 mg via RESPIRATORY_TRACT
  Filled 2011-08-16 (×14): qty 2.5

## 2011-08-16 MED ORDER — METHYLPREDNISOLONE SODIUM SUCC 40 MG IJ SOLR
40.0000 mg | Freq: Two times a day (BID) | INTRAMUSCULAR | Status: DC
  Administered 2011-08-16 – 2011-08-18 (×4): 40 mg via INTRAVENOUS
  Filled 2011-08-16 (×6): qty 1

## 2011-08-16 MED ORDER — ALBUTEROL SULFATE (5 MG/ML) 0.5% IN NEBU
2.5000 mg | INHALATION_SOLUTION | Freq: Four times a day (QID) | RESPIRATORY_TRACT | Status: DC
  Administered 2011-08-16 – 2011-08-22 (×6): 2.5 mg via RESPIRATORY_TRACT
  Filled 2011-08-16 (×14): qty 0.5

## 2011-08-16 MED ORDER — ALBUMIN HUMAN 5 % IV SOLN
INTRAVENOUS | Status: AC
  Filled 2011-08-16: qty 250

## 2011-08-16 MED ORDER — MORPHINE SULFATE 2 MG/ML IJ SOLN
2.0000 mg | INTRAMUSCULAR | Status: DC | PRN
  Administered 2011-08-16: 4 mg via INTRAVENOUS
  Administered 2011-08-17: 2 mg via INTRAVENOUS
  Administered 2011-08-17: 4 mg via INTRAVENOUS
  Administered 2011-08-17 (×3): 2 mg via INTRAVENOUS
  Administered 2011-08-17: 4 mg via INTRAVENOUS
  Administered 2011-08-17: 2 mg via INTRAVENOUS
  Administered 2011-08-18: 4 mg via INTRAVENOUS
  Administered 2011-08-18 (×2): 2 mg via INTRAVENOUS
  Filled 2011-08-16: qty 2
  Filled 2011-08-16 (×2): qty 1
  Filled 2011-08-16 (×2): qty 2
  Filled 2011-08-16 (×2): qty 1
  Filled 2011-08-16 (×2): qty 2
  Filled 2011-08-16 (×2): qty 1

## 2011-08-16 MED ORDER — SODIUM CHLORIDE 0.9 % IV SOLN
250.0000 mL | INTRAVENOUS | Status: DC | PRN
  Administered 2011-08-16: 250 mL via INTRAVENOUS

## 2011-08-16 MED ORDER — IPRATROPIUM BROMIDE 0.02 % IN SOLN: 0.5000 mg | RESPIRATORY_TRACT | Status: DC | PRN

## 2011-08-16 MED ORDER — CHLORHEXIDINE GLUCONATE 0.12 % MT SOLN
15.0000 mL | Freq: Two times a day (BID) | OROMUCOSAL | Status: DC
Start: 2011-08-16 — End: 2011-09-01
  Administered 2011-08-15 – 2011-09-01 (×32): 15 mL via OROMUCOSAL
  Filled 2011-08-16 (×37): qty 15

## 2011-08-16 MED ORDER — ALBUTEROL SULFATE (5 MG/ML) 0.5% IN NEBU: 2.5000 mg | INHALATION_SOLUTION | RESPIRATORY_TRACT | Status: DC | PRN

## 2011-08-16 MED ORDER — TRAMADOL HCL 50 MG PO TABS
50.0000 mg | ORAL_TABLET | Freq: Three times a day (TID) | ORAL | Status: DC | PRN
  Administered 2011-08-16 – 2011-08-22 (×4): 50 mg via ORAL
  Filled 2011-08-16 (×7): qty 1

## 2011-08-16 MED ORDER — LEVOTHYROXINE SODIUM 100 MCG IV SOLR
62.5000 ug | Freq: Every day | INTRAVENOUS | Status: DC
  Administered 2011-08-16 – 2011-08-17 (×2): 62.5 ug via INTRAVENOUS
  Filled 2011-08-16 (×4): qty 3.1

## 2011-08-16 MED ORDER — PANTOPRAZOLE SODIUM 40 MG IV SOLR
40.0000 mg | Freq: Two times a day (BID) | INTRAVENOUS | Status: DC
  Administered 2011-08-16 – 2011-08-18 (×5): 40 mg via INTRAVENOUS
  Filled 2011-08-16 (×7): qty 40

## 2011-08-16 NOTE — Progress Notes (Signed)
Entered pt room after hearing ventilator alarming to find pt's right mitten off and endotracheal tube out of place, with cuff at pt's lips.  I fully removed ETT/OGT, suctioned pt's oral cavity thoroughly, placed pt on 4L O2 via nasal cannula, and encouraged coughing/deep breathing.  VSS, O2 sats at 99%, no respiratory distress apparent.  Dr. Craige Cotta notified.  Will continue to monitor.

## 2011-08-16 NOTE — Procedures (Signed)
Extubation Procedure Note  Patient Details:   Name: Natalie [name] DOB: 18-Feb-1931 MRN: [id number]   Airway Documentation:   2  Evaluation  O2 sats: stable throughout Complications: No apparent complications Patient did tolerate procedure well. Bilateral Breath Sounds: Clear Suctioning: Airway Yes.  Pt was placed on cpap at 10/5 to evaluate weaning.  Vitals looked good but patient extubated herself before we got in there.  Placed on 4 L oxygen.  Younes Degeorge V 08/16/2011, 3:39 PM 22

## 2011-08-16 NOTE — Progress Notes (Signed)
25 mL diprivan wasted/flushed in sink in room 2316 .  Witnessed and verified by Francetta Found, RN.

## 2011-08-16 NOTE — Progress Notes (Signed)
Name: Natalie [name] MRN: [id number] DOB: 1930-08-16    LOS: 2   PULMONARY / CRITICAL CARE MEDICINE  HPI:  78 yo female former smoker from Rehab admitted on 08/14/2011 with acute metabolic encephalopathy from acute on chronic hypercapnic respiratory failure with VDRF.  [name], narcotic use.  She was in hospital until April 30 for GI bleed from gastric ulcer and in rehab due to deconditioning.  She was tx for PNA in rehab. PMHx HTN, Hypothyroidism, PUD, A fib, COPD, Anxiety, Arthritis, Lt renal mass, Trigeminal neuralgia   Current Status: Tolerating SBT.  Vital Signs: BP 120/92  Pulse 94  Temp(Src) 98.4 F (36.9 C) (Oral)  Resp 23  Ht 5\' 2"  (1.575 m)  Wt 146 lb 9.7 oz (66.5 kg)  BMI 26.81 kg/m2  SpO2 99%  Vent Mode:  [-] PRVC FiO2 (%):  [30 %-40.9 %] 30 % Set Rate:  [14 bmp-20 bmp] 14 bmp Vt Set:  [360 mL-400 mL] 400 mL PEEP:  [5 cmH20] 5 cmH20 Plateau Pressure:  [13 cmH20-18 cmH20] 13 cmH20  Physical Exam: General - no distress HEENT - ETT in place Cardiac - irregular, no murmur Chest - prolonged exhalation, no wheeze/rales Abd - soft, non-tender Ext - no edema Neuro - somnolent, follows simple commands  CBC    Component Value Date/Time   WBC 14.6* 08/15/2011 0645   RBC 3.71* 08/15/2011 0645   HGB 10.9* 08/15/2011 0645   HCT 33.9* 08/15/2011 0645   PLT 228 08/15/2011 0645   MCV 91.4 08/15/2011 0645   MCH 29.4 08/15/2011 0645   MCHC 32.2 08/15/2011 0645   RDW 12.9 08/15/2011 0645   LYMPHSABS 1.0 08/14/2011 2345   MONOABS 0.3 08/14/2011 2345   EOSABS 0.0 08/14/2011 2345   BASOSABS 0.0 08/14/2011 2345    BMET    Component Value Date/Time   NA 133* 08/15/2011 0645   K 4.8 08/15/2011 0645   CL 91* 08/15/2011 0645   CO2 35* 08/15/2011 0645   GLUCOSE 123* 08/15/2011 0645   BUN 18 08/15/2011 0645   CREATININE 0.51 08/15/2011 0645   CALCIUM 9.1 08/15/2011 0645   GFRNONAA 90* 08/15/2011 0645   GFRAA >90 08/15/2011 0645    Lab Results    Component Value Date   ALT 30 08/14/2011   AST 27 08/14/2011   ALKPHOS 52 08/14/2011   BILITOT 0.2* 08/14/2011   ABG    Component Value Date/Time   PHART 7.434* 08/15/2011 0527   PCO2ART 56.6* 08/15/2011 0527   PO2ART 142.0* 08/15/2011 0527   HCO3 37.9* 08/15/2011 0527   TCO2 40 08/15/2011 0527   O2SAT 99.0 08/15/2011 0527    Dg Chest Portable 1 View  08/15/2011  *RADIOLOGY REPORT*  Clinical Data: Endotracheal tube placement  PORTABLE CHEST - 1 VIEW  Comparison: 08/14/2011  Findings: Interval placement of endotracheal tube with tip about 2.9 cm above the carina.  An enteric tube has been placed with tip in the upper stomach just below the esophagogastric junction. Normal heart size and pulmonary vascularity.  Emphysematous changes and scattered fibrosis in the lungs.  Calcification of the aorta. No blunting of costophrenic angles.  No pneumothorax.  IMPRESSION: Endotracheal tube tip is about 2.9 cm above the carina.  Enteric tube tip appears to be in the upper stomach just below the esophagogastric junction.  Otherwise stable appearance of the chest.  Original Report Authenticated By: Marlon Pel, M.D.   Dg Chest Port 1 View  08/15/2011  *  RADIOLOGY REPORT*  Clinical Data: Shortness of breath, increased lethargy  PORTABLE CHEST - 1 VIEW  Comparison: 07/22/2009  Findings: Cardiomegaly with mild interstitial edema and probable small bilateral pleural effusions.  Left lower lobe opacity, likely atelectasis, pneumonia not excluded.  No pneumothorax.  IMPRESSION: Cardiomegaly with mild interstitial edema and probable small bilateral pleural effusions.  Associated left lower lobe opacity, likely atelectasis, pneumonia not excluded.  Original Report Authenticated By: Charline Bills, M.D.      ASSESSMENT AND PLAN  PULMONARY  Acute on chronic hypoxic, hypercapnic respiratory failure from AECOPD in setting of narcotic/benzo use. Plan: -pressure support wean as tolerated>>hopefully extubate  soon -continue combivent -change solumedrol to 40 mg q12h  -f/u CXR, ABG   CARDIOVASCULAR Echo 5/25>>EF 65 to 70%, mild MR, lipomatous hypertrophy of atrial septum Doppler legs 5/25>>no DVT  Hx of A fib>>rate controlled.  No coumadin due to recent upper GI bleed. Plan: -monitor heart rate on tele -may need input from GI if/when coumadin can be resumed, or whether ASA would be better alternative  Hx of HTN Plan: -monitor BP   RENAL  Hx of renal mass>>seen on CT abd/pelvis 07/21/11 Plan: -will need further assessment when more stable    HEMATOLOGIC  Anemia with recent hx of GI bleed.  No evidence for bleeding at this time. Plan: -f/u CBC -transfuse for Hb < 7  GASTROINTESTINAL  Gastric ulcer with upper GI bleed in April 2013. Plan: -BID protonix  Nutrition. Plan: -if not able to extubate soon, will then need to start tube feeds  INFECTIOUS  Cultures: Blood 5/25>> Urine 5/25>> Sputum 5/25>>  Antibiotics: Vancomycin 05/25>> Zosyn 5/25>>  Possible sources of infection are HCAP or UTI. Plan: -D2/x Abx>>can likely d/c soon if cultures negative   NEUROLOGIC  Sedation Plan: -limit while weaning vent  Chronic pain, anxiety Plan: -will need to adjust outpt regimen to avoid oversedation  ENDOCRINE  Hx of hypothyroidism Plan: -synthroid 75 mcg daily>>use half dose IV until able to swallow   BEST PRACTICE / DISPOSITION - Level of Care:  ICU - Primary Service: PCCM - Code Status:  Full code - Diet:  NPO - DVT Px:  SCDs - GI Px:  Protonix  Critical care time 35 minutes  Coralyn Helling, MD 08/16/2011, 7:29 AM Pager:  [phone]

## 2011-08-17 ENCOUNTER — Inpatient Hospital Stay (HOSPITAL_COMMUNITY): Payer: Medicare Other

## 2011-08-17 DIAGNOSIS — G8929 Other chronic pain: Secondary | ICD-10-CM

## 2011-08-17 DIAGNOSIS — F329 Major depressive disorder, single episode, unspecified: Secondary | ICD-10-CM

## 2011-08-17 DIAGNOSIS — M549 Dorsalgia, unspecified: Secondary | ICD-10-CM

## 2011-08-17 DIAGNOSIS — N390 Urinary tract infection, site not specified: Secondary | ICD-10-CM

## 2011-08-17 DIAGNOSIS — D62 Acute posthemorrhagic anemia: Secondary | ICD-10-CM

## 2011-08-17 DIAGNOSIS — R109 Unspecified abdominal pain: Secondary | ICD-10-CM

## 2011-08-17 LAB — CBC
MCH: 28.4 pg (ref 26.0–34.0)
MCV: 88.5 fL (ref 78.0–100.0)
Platelets: 226 10*3/uL (ref 150–400)
RDW: 13.7 % (ref 11.5–15.5)
WBC: 10 10*3/uL (ref 4.0–10.5)

## 2011-08-17 LAB — URINE CULTURE

## 2011-08-17 LAB — BASIC METABOLIC PANEL
Calcium: 8.4 mg/dL (ref 8.4–10.5)
Creatinine, Ser: 0.49 mg/dL — ABNORMAL LOW (ref 0.50–1.10)
GFR calc Af Amer: >90 mL/min (ref >90)
GFR calc non Af Amer: >90 mL/min (ref >90)

## 2011-08-17 LAB — CULTURE, RESPIRATORY W GRAM STAIN

## 2011-08-17 MED ORDER — FUROSEMIDE 10 MG/ML IJ SOLN
40.0000 mg | Freq: Three times a day (TID) | INTRAMUSCULAR | Status: AC
  Administered 2011-08-17 (×2): 40 mg via INTRAVENOUS
  Filled 2011-08-17 (×2): qty 4

## 2011-08-17 MED ORDER — HYDROCODONE-ACETAMINOPHEN 5-325 MG PO TABS
1.0000 | ORAL_TABLET | Freq: Three times a day (TID) | ORAL | Status: DC | PRN
  Administered 2011-08-17 – 2011-08-19 (×5): 1 via ORAL
  Filled 2011-08-17 (×6): qty 1

## 2011-08-17 MED ORDER — CARVEDILOL 3.125 MG PO TABS
3.1250 mg | ORAL_TABLET | Freq: Two times a day (BID) | ORAL | Status: DC
  Administered 2011-08-17 – 2011-08-18 (×4): 3.125 mg via ORAL
  Filled 2011-08-17 (×5): qty 1

## 2011-08-17 MED ORDER — POTASSIUM CHLORIDE CRYS ER 20 MEQ PO TBCR
40.0000 meq | EXTENDED_RELEASE_TABLET | Freq: Three times a day (TID) | ORAL | Status: AC
  Administered 2011-08-17 (×2): 40 meq via ORAL
  Filled 2011-08-17 (×2): qty 2

## 2011-08-17 MED ORDER — LEVOTHYROXINE SODIUM 100 MCG IV SOLR
37.5000 ug | Freq: Every day | INTRAVENOUS | Status: DC
  Administered 2011-08-18: 38 ug via INTRAVENOUS
  Filled 2011-08-17 (×2): qty 1.9

## 2011-08-17 NOTE — Progress Notes (Signed)
Name: Natalie [name] MRN: [id number] DOB: 01/07/1931    LOS: 3   PULMONARY / CRITICAL CARE MEDICINE  HPI:  78 yo female former smoker from Rehab admitted on 08/14/2011 with acute metabolic encephalopathy from acute on chronic hypercapnic respiratory failure with VDRF.  [name], narcotic use.  She was in hospital until April 30 for GI bleed from gastric ulcer and in rehab due to deconditioning.  She was tx for PNA in rehab. PMHx HTN, Hypothyroidism, PUD, A fib, COPD, Anxiety, Arthritis, Lt renal mass, Trigeminal neuralgia  Current Status: Extubated and doing well from a respiratory standpoint.  Vital Signs: BP 123/75  Pulse 70  Temp(Src) 97.8 F (36.6 C) (Oral)  Resp 25  Ht 5\' 2"  (1.575 m)  Wt 67.7 kg (149 lb 4 oz)  BMI 27.30 kg/m2  SpO2 97%   Intake/Output Summary (Last 24 hours) at 08/17/11 0811 Last data filed at 08/17/11 0727  Gross per 24 hour  Intake  777.5 ml  Output    725 ml  Net   52.5 ml   Physical Exam: General - no distress HEENT - ETT in place Cardiac - irregular, no murmur Chest - prolonged exhalation, no wheeze/rales Abd - soft, non-tender Ext - no edema Neuro - somnolent, follows simple commands  CBC    Component Value Date/Time   WBC 10.0 08/17/2011 0415   RBC 3.66* 08/17/2011 0415   HGB 10.4* 08/17/2011 0415   HCT 32.4* 08/17/2011 0415   PLT 226 08/17/2011 0415   MCV 88.5 08/17/2011 0415   MCH 28.4 08/17/2011 0415   MCHC 32.1 08/17/2011 0415   RDW 13.7 08/17/2011 0415   LYMPHSABS 1.0 08/14/2011 2345   MONOABS 0.3 08/14/2011 2345   EOSABS 0.0 08/14/2011 2345   BASOSABS 0.0 08/14/2011 2345   BMET    Component Value Date/Time   NA 138 08/17/2011 0415   K 3.8 08/17/2011 0415   CL 97 08/17/2011 0415   CO2 36* 08/17/2011 0415   GLUCOSE 123* 08/17/2011 0415   BUN 23 08/17/2011 0415   CREATININE 0.49* 08/17/2011 0415   CALCIUM 8.4 08/17/2011 0415   GFRNONAA >90 08/17/2011 0415   GFRAA >90 08/17/2011 0415    Lab  Results  Component Value Date   ALT 30 08/14/2011   AST 27 08/14/2011   ALKPHOS 52 08/14/2011   BILITOT 0.2* 08/14/2011   ABG    Component Value Date/Time   PHART 7.529* 08/16/2011 0805   PCO2ART 47.2* 08/16/2011 0805   PO2ART 99.0 08/16/2011 0805   HCO3 39.3* 08/16/2011 0805   TCO2 41 08/16/2011 0805   O2SAT 98.0 08/16/2011 0805    No results found.    ASSESSMENT AND PLAN  PULMONARY  Acute on chronic hypoxic, hypercapnic respiratory failure from AECOPD in setting of narcotic/benzo use. Plan: -Extubate don 5/26 and doing well.. -Continued combivent. -Changed solumedrol to 40 mg q12h will maintain for now. -f/u CXR, ABG. -Transfer to SDU. -Diurese as ordered.  CARDIOVASCULAR Echo 5/25>>EF 65 to 70%, mild MR, lipomatous hypertrophy of atrial septum Doppler legs 5/25>>no DVT  Hx of A fib>>rate controlled.  No coumadin due to recent upper GI bleed. Plan: -Monitor heart rate on tele -May need input from GI if/when coumadin can be resumed, or whether ASA would be better alternative.  Hx of HTN Plan: -Monitor BP.  RENAL Hx of renal mass>>seen on CT abd/pelvis 07/21/11 Plan: -Will need further assessment when more stable and out of the ICU.  HEMATOLOGIC Anemia with recent hx of GI bleed.  No evidence for bleeding at this time. Plan: -F/u CBC -Transfuse for Hb < 7  GASTROINTESTINAL  Gastric ulcer with upper GI bleed in April 2013. Plan: -BID protonix  Nutrition. Plan: -Swallow evaluation today.  INFECTIOUS  Cultures: Blood 5/25>>NTD Urine 5/25>>GNR Sputum 5/25>>Reincubated on 5/27  Antibiotics: Vancomycin 05/25>>5/27 Zosyn 5/25>>  Possible sources of infection are HCAP or UTI. Plan: -D3/x Abx>>Will d/c vanc.   NEUROLOGIC  Sedation Plan: -limit while weaning vent  Chronic pain, anxiety Plan: -Continue current regimen but change all to PRN.  ENDOCRINE  Hx of hypothyroidism Plan: -synthroid 75 mcg daily>>use half dose IV until able to  swallow   BEST PRACTICE / DISPOSITION - Level of Care:  ICU - Primary Service: PCCM - Code Status:  Full code - Diet:  NPO - DVT Px:  SCDs - GI Px:  Protonix  Alyson Reedy, M.D. [facility] Pulmonary/Critical Care Medicine. Pager: [phone]. After hours pager: [phone].

## 2011-08-17 NOTE — Progress Notes (Signed)
eLink Physician-Brief Progress Note Patient Name: TWINKLE SOCKWELL DOB: 01-01-1931 MRN: [id number]  Date of Service  08/17/2011   HPI/Events of Note     eICU Interventions  Resumed vicodin for pain - substitute norco   Intervention Category Minor Interventions: Routine modifications to care plan (e.g. PRN medications for pain, fever)  Xaivier Malay V. 08/17/2011, 2:23 AM

## 2011-08-18 ENCOUNTER — Inpatient Hospital Stay (HOSPITAL_COMMUNITY): Payer: Medicare Other

## 2011-08-18 DIAGNOSIS — K922 Gastrointestinal hemorrhage, unspecified: Secondary | ICD-10-CM

## 2011-08-18 LAB — CBC
Platelets: 237 10*3/uL (ref 150–400)
RDW: 13.6 % (ref 11.5–15.5)
WBC: 9.4 10*3/uL (ref 4.0–10.5)

## 2011-08-18 LAB — MAGNESIUM: Magnesium: 2.1 mg/dL (ref 1.5–2.5)

## 2011-08-18 LAB — BASIC METABOLIC PANEL
Chloride: 95 mEq/L — ABNORMAL LOW (ref 96–112)
GFR calc Af Amer: >90 mL/min (ref >90)
Potassium: 4.2 mEq/L (ref 3.5–5.1)

## 2011-08-18 MED ORDER — CARVEDILOL 6.25 MG PO TABS
6.2500 mg | ORAL_TABLET | Freq: Two times a day (BID) | ORAL | Status: DC
  Filled 2011-08-18 (×3): qty 1

## 2011-08-18 MED ORDER — PREDNISONE 20 MG PO TABS
30.0000 mg | ORAL_TABLET | Freq: Every day | ORAL | Status: DC
  Administered 2011-08-18 – 2011-08-24 (×7): 30 mg via ORAL
  Filled 2011-08-18 (×10): qty 1

## 2011-08-18 MED ORDER — SODIUM CHLORIDE 0.9 % IV SOLN: 0.2000 ug/kg/h | INTRAVENOUS | Status: DC

## 2011-08-18 MED ORDER — DOXYCYCLINE HYCLATE 100 MG PO TABS
100.0000 mg | ORAL_TABLET | Freq: Two times a day (BID) | ORAL | Status: DC
  Administered 2011-08-18 – 2011-08-19 (×3): 100 mg via ORAL
  Filled 2011-08-18 (×4): qty 1

## 2011-08-18 MED ORDER — PANTOPRAZOLE SODIUM 40 MG PO TBEC
40.0000 mg | DELAYED_RELEASE_TABLET | Freq: Two times a day (BID) | ORAL | Status: DC
  Administered 2011-08-18 – 2011-08-25 (×14): 40 mg via ORAL
  Filled 2011-08-18 (×15): qty 1

## 2011-08-18 MED ORDER — LEVOTHYROXINE SODIUM 75 MCG PO TABS
75.0000 ug | ORAL_TABLET | Freq: Every day | ORAL | Status: DC
  Administered 2011-08-19 – 2011-09-01 (×14): 75 ug via ORAL
  Filled 2011-08-18 (×17): qty 1

## 2011-08-18 MED ORDER — BENAZEPRIL HCL 40 MG PO TABS
40.0000 mg | ORAL_TABLET | Freq: Every day | ORAL | Status: DC
  Administered 2011-08-18 – 2011-08-20 (×3): 40 mg via ORAL
  Filled 2011-08-18 (×3): qty 1

## 2011-08-18 NOTE — Progress Notes (Signed)
Name: Natalie [name] MRN: [id number] DOB: [birth year]    LOS: 4   PULMONARY / CRITICAL CARE MEDICINE  HPI:  78 yo female former smoker from Rehab admitted on 08/14/2011 with acute metabolic encephalopathy from acute on chronic hypercapnic respiratory failure with VDRF.  [name], narcotic use.  She was in hospital until April 30 for GI bleed from gastric ulcer and in rehab due to deconditioning.  She was tx for PNA in rehab. PMHx HTN, Hypothyroidism, PUD, A fib, COPD, Anxiety, Arthritis, Lt renal mass, Trigeminal neuralgia  Current Status: Did okay with PT.  Denies chest pain/wheeze.  Still has cough with sputum.  Vital Signs: BP 145/88  Pulse 105  Temp(Src) 98 F (36.7 C) (Oral)  Resp 24  Ht 5\' 2"  (1.575 m)  Wt 141 lb 12.8 oz (64.32 kg)  BMI 25.94 kg/m2  SpO2 97%   Intake/Output Summary (Last 24 hours) at 08/18/11 1130 Last data filed at 08/18/11 0800  Gross per 24 hour  Intake 1421.5 ml  Output   2835 ml  Net -1413.5 ml   Physical Exam: General - no distress, sitting in chair HEENT - no sinus tenderness Cardiac - irregular, no murmur Chest - prolonged exhalation, no wheeze/rales Abd - soft, non-tender Ext - no edema Neuro - Awake, no distress, pleasant, normal strength  CBC    Component Value Date/Time   WBC 9.4 08/18/2011 0409   RBC 3.84* 08/18/2011 0409   HGB 11.0* 08/18/2011 0409   HCT 34.8* 08/18/2011 0409   PLT 237 08/18/2011 0409   MCV 90.6 08/18/2011 0409   MCH 28.6 08/18/2011 0409   MCHC 31.6 08/18/2011 0409   RDW 13.6 08/18/2011 0409   LYMPHSABS 1.0 08/14/2011 2345   MONOABS 0.3 08/14/2011 2345   EOSABS 0.0 08/14/2011 2345   BASOSABS 0.0 08/14/2011 2345   BMET    Component Value Date/Time   NA 139 08/18/2011 0409   K 4.2 08/18/2011 0409   CL 95* 08/18/2011 0409   CO2 39* 08/18/2011 0409   GLUCOSE 123* 08/18/2011 0409   BUN 23 08/18/2011 0409   CREATININE 0.59 08/18/2011 0409   CALCIUM 8.4 08/18/2011 0409   GFRNONAA 86*  08/18/2011 0409   GFRAA >90 08/18/2011 0409    Lab Results  Component Value Date   ALT 30 08/14/2011   AST 27 08/14/2011   ALKPHOS 52 08/14/2011   BILITOT 0.2* 08/14/2011   ABG    Component Value Date/Time   PHART 7.529* 08/16/2011 0805   PCO2ART 47.2* 08/16/2011 0805   PO2ART 99.0 08/16/2011 0805   HCO3 39.3* 08/16/2011 0805   TCO2 41 08/16/2011 0805   O2SAT 98.0 08/16/2011 0805    Dg Chest Port 1 View  08/17/2011  **ADDENDUM** CREATED: 08/17/2011 11:25:06  Endotracheal and NG tubes have been removed.  **END ADDENDUM** SIGNED BY: Marlowe Aschoff. Hoss, M.D.   08/17/2011  *RADIOLOGY REPORT*  Clinical Data: Respiratory failure  PORTABLE CHEST - 1 VIEW  Comparison: 08/15/2011  Findings: Mild cardiomegaly.  Bibasilar hazy opacity likely a combination of pleural effusion and atelectasis verses airspace disease.  No pneumothorax.  IMPRESSION:  Bibasilar opacity likely a combination of pleural effusion and atelectasis versus airspace disease.  Original Report Authenticated By: Donavan Burnet, M.D.      ASSESSMENT AND PLAN  PULMONARY ETT 5/24>>5/26  Acute on chronic hypoxic, hypercapnic respiratory failure from AECOPD in setting of narcotic/benzo use. Plan: -Continued duoneb -change solumedrol to prednisone -f/u CXR intermittently  CARDIOVASCULAR Echo 5/25>>EF 65 to 70%, mild MR, lipomatous hypertrophy of atrial septum Doppler legs 5/25>>no DVT  Hx of A fib>>rate controlled.  No coumadin due to recent upper GI bleed. Plan: -Monitor heart rate on tele -May need input from GI if/when coumadin can be resumed, or whether ASA would be better alternative.  Hx of HTN Plan: -Monitor BP.  RENAL Hx of renal mass>>seen on CT abd/pelvis 07/21/11 Plan: -Will need further assessment when more stable>>can be done as outpt  HEMATOLOGIC Anemia with recent hx of GI bleed.  No evidence for bleeding at this time. Plan: -F/u CBC -Transfuse for Hb < 7  GASTROINTESTINAL  Gastric ulcer with upper GI  bleed in April 2013. Plan: -BID protonix  Nutrition. Plan: -advance diet as tolerated  INFECTIOUS  Cultures: Blood 5/25>>NTD Urine 5/25>>GNR Sputum 5/25>>Reincubated on 5/27  Antibiotics: Vancomycin 05/25>>5/27 Zosyn 5/25>>5/28 Doxycycline 5/28>>  Possible sources of infection are HCAP or UTI. Plan: -D4/x Abx -change to doxycycline 5/28   NEUROLOGIC  Chronic pain, anxiety Plan: -Continue current regimen but change all to PRN.  ENDOCRINE  Hx of hypothyroidism Plan: -synthroid 75 mcg daily   BEST PRACTICE / DISPOSITION - Level of Care:  Tele - Primary Service: PCCM - Code Status:  Full code - Diet:  Heart healthy - DVT Px:  SCDs - GI Px:  Protonix - Will need SNF placement  Coralyn Helling, MD [facility] Pulmonary/Critical Care 08/18/2011, 11:36 AM Pager:  [phone] After 3pm call: [phone]

## 2011-08-18 NOTE — Clinical Social Work Psychosocial (Signed)
     Clinical Social Work Department BRIEF PSYCHOSOCIAL ASSESSMENT 08/18/2011  Patient:  Natalie [name], Natalie [name]     Account Number:  [id number]     Admit date:  08/14/2011  Clinical Social Worker:  Margaree Mackintosh  Date/Time:  08/18/2011 01:05 PM  Referred by:  Physician  Date Referred:  08/18/2011 Referred for  SNF Placement   Other Referral:   Interview type:  Family Other interview type:    PSYCHOSOCIAL DATA Living Status:  FACILITY Admitted from facility:  [facility] Level of care:  Skilled Nursing Facility Primary support name:  [phone] Primary support relationship to patient:  CHILD, ADULT Degree of support available:   Adequate.    CURRENT CONCERNS Current Concerns  Post-Acute Placement   Other Concerns:    SOCIAL WORK ASSESSMENT / PLAN Clinical Social Worker recieved notification from RN that pt's dtr wished to speak with CSW.  CSW phoned pt's dtr on her cell phone-[phone]- CSW introduced self and explained role.  CSW offered emotional support.  CSW provided safe space for dtr to process feelings; dtr feels that health professionals are being "lazy" by "drugging my mom".  CSW inquired about what avenue dtr feels would be sufficient; dtr would like pt to dc to a "hollistic and wellness" facility where pt would recieve help for her "possible addiction to pain killers".  CSW provided emotional support and explained that there are several avenues the hospital evaluates when discussing dc planning; SNF, CIR, HH.  Dtr expressed interest in CIR and HH but is open to pt being faxed out "just for informaiton".  CSW to continue to follow and assist as needed.   Assessment/plan status:   Other assessment/ plan:   Information/referral to community resources:    PATIENTS/FAMILYS RESPONSE TO PLAN OF CARE:

## 2011-08-18 NOTE — Progress Notes (Signed)
Called to room by RN.  Patient with multiple complaints of pain.    Assessment:  Patient in NAD, lying in bed.  Respirations even/non-labored.  Able to speak full sentences.  Not oriented.    Plan: -discussed pain control options available with RN.  Likely best plan is to use PO Norco PRN with morphine for break through pain.  Patient has noted history of chronic abdominal and back pain.  Assess KUB. -Will continue to monitor.    Canary Brim, NP-C [facility] Pgr: [phone] or [phone]

## 2011-08-18 NOTE — Progress Notes (Signed)
Patient has now calm down and is currently laying in bed; will continue to monitor patient.  Patient's daughter has called upset and would like for the MD to contact her as soon as possible concerning her mother and her condition, she states that she asked for the patient's doctor to call her but has not heard from anyone yet, I assured her that I would put a note in patient's under physician's sticky notes and inform physician to contact her on tomorrow while rounding.  _________________________________________________________________________________D. Manson Passey RN

## 2011-08-18 NOTE — Progress Notes (Signed)
Utilization review completed.  

## 2011-08-18 NOTE — Progress Notes (Signed)
Physical Therapy Evaluation Patient Details Name: Natalie [name] MRN: [id number] DOB: 1930-11-05 Today's Date: 08/18/2011 Time: 5621-3086 PT Time Calculation (min): 24 min  PT Assessment / Plan / Recommendation Clinical Impression  Pt is a 78 y/o female admitted with weakness and lethargy from SNF along with the below PT problem list.  Pt would benefit from acute PT to maximize independence to facilitate d/c back to SNF for further rehab.    PT Assessment  Patient needs continued PT services    Follow Up Recommendations  Skilled nursing facility    Barriers to Discharge None      lEquipment Recommendations  Defer to next venue    Recommendations for Other Services     Frequency Min 2X/week    Precautions / Restrictions Precautions Precautions: Fall Restrictions Weight Bearing Restrictions: No   Pertinent Vitals/Pain N/A      Mobility  Bed Mobility Bed Mobility: Supine to Sit Supine to Sit: 3: Mod assist;HOB flat Details for Bed Mobility Assistance: Assist for bilateral LEs and trunk to translate to midline.  Cues for sequence. Transfers Transfers: Sit to Stand;Stand to Sit Sit to Stand: 4: Min assist;With upper extremity assist;From bed Stand to Sit: 4: Min assist;With upper extremity assist;To chair/3-in-1 Details for Transfer Assistance: Assist for balance and to translate trunk anterior over BOS.  Cues for sequence. Ambulation/Gait Ambulation/Gait Assistance: 4: Min assist Ambulation Distance (Feet): 5 Feet Assistive device: None Ambulation/Gait Assistance Details: Assist for balance and tall posture with cues for sequence.  Distance limited by patient fatigue. Gait Pattern: Step-through pattern;Trunk flexed Stairs: No Wheelchair Mobility Wheelchair Mobility: No    Exercises     PT Diagnosis: Generalized weakness  PT Problem List: Decreased strength;Decreased activity tolerance;Decreased balance;Decreased mobility;Decreased knowledge of use of DME;Pain PT  Treatment Interventions: DME instruction;Gait training;Functional mobility training;Therapeutic activities;Balance training;Patient/family education   PT Goals Acute Rehab PT Goals PT Goal Formulation: With patient Time For Goal Achievement: 09/01/11 Potential to Achieve Goals: Good Pt will go Supine/Side to Sit: with supervision PT Goal: Supine/Side to Sit - Progress: Goal set today Pt will go Sit to Supine/Side: with supervision PT Goal: Sit to Supine/Side - Progress: Goal set today Pt will go Sit to Stand: with supervision PT Goal: Sit to Stand - Progress: Goal set today Pt will go Stand to Sit: with supervision PT Goal: Stand to Sit - Progress: Goal set today Pt will Transfer Bed to Chair/Chair to Bed: with supervision PT Transfer Goal: Bed to Chair/Chair to Bed - Progress: Goal set today Pt will Ambulate: 51 - 150 feet;with supervision;with least restrictive assistive device PT Goal: Ambulate - Progress: Goal set today Pt will Perform Home Exercise Program: Independently PT Goal: Perform Home Exercise Program - Progress: Goal set today  Visit Information  Last PT Received On: 08/18/11 Assistance Needed: +1    Subjective Data  Subjective: "We can do it." Patient Stated Goal: Get well.   Prior Functioning  Home Living Lives With: Daughter Available Help at Discharge: Family;Available 24 hours/day Type of Home: House Home Access: Stairs to enter [employer] of Steps: 3 Entrance Stairs-Rails: Can reach both;Right;Left Home Layout: Two level;Able to live on main level with bedroom/bathroom Alternate Level Stairs-Number of Steps: her bedroom is on the second floor but she reports she can sleep downstairs if necessary; one flight of stairs to go up to her bedroom Alternate Level Stairs-Rails: Right;Left;Can reach both Home Adaptive Equipment: Walker - rolling;Straight cane Prior Function Level of Independence: Independent with assistive device(s) Able  to Take  Stairs?: Yes Driving: No Vocation: Retired Musician: No difficulties    Cognition  Overall Cognitive Status: Appears within functional limits for tasks assessed/performed Arousal/Alertness: Awake/alert Orientation Level: Appears intact for tasks assessed Behavior During Session: [facility] for tasks performed    Extremity/Trunk Assessment Right Upper Extremity Assessment RUE ROM/Strength/Tone: Within functional levels RUE Sensation: WFL - Light Touch RUE Coordination: WFL - gross/fine motor Left Upper Extremity Assessment LUE ROM/Strength/Tone: Within functional levels LUE Sensation: WFL - Light Touch LUE Coordination: WFL - gross/fine motor Right Lower Extremity Assessment RLE ROM/Strength/Tone: Deficits RLE ROM/Strength/Tone Deficits: Grossly 3/5. RLE Sensation: WFL - Light Touch RLE Coordination: WFL - gross/fine motor Left Lower Extremity Assessment LLE ROM/Strength/Tone: Deficits LLE ROM/Strength/Tone Deficits: Grossly 3/5. LLE Sensation: WFL - Light Touch LLE Coordination: WFL - gross/fine motor Trunk Assessment Trunk Assessment: Kyphotic   Balance Balance Balance Assessed: No  End of Session PT - End of Session Equipment Utilized During Treatment: Gait belt Activity Tolerance: Patient limited by fatigue;Patient tolerated treatment well Patient left: in chair;with call bell/phone within reach Nurse Communication: Mobility status   Cephus Shelling 08/18/2011, 9:46 AM  08/18/2011 Cephus Shelling, PT, DPT [phone]

## 2011-08-18 NOTE — Progress Notes (Signed)
Patient has arrived to unit, report received from nurse on 3300, patient is confused and complaining of pain, 2mg  of Morphine given to patient; will continue to monitor patient______________________________________D. Manson Passey RN

## 2011-08-18 NOTE — Progress Notes (Signed)
Patient seems very confused and continues to yell out for pain medicine, MD called and made aware. Patient given 2mg  more of Morphine; will continue to monitor patient____________________________________D. Manson Passey RN

## 2011-08-18 NOTE — Progress Notes (Signed)
Patient's assessment, medications etc. has been reviewed and cosigned for academy nurse________________________D. Manson Passey RN

## 2011-08-18 NOTE — Progress Notes (Deleted)
Physical Therapy Treatment Patient Details Name: Natalie [name] MRN: [id number] DOB: [birth year] Today's Date: 08/18/2011 Time: 6213-0865 PT Time Calculation (min): 24 min  PT Assessment / Plan / Recommendation Comments on Treatment Session       Follow Up Recommendations  Skilled nursing facility    Barriers to Discharge None      Equipment Recommendations  Defer to next venue    Recommendations for Other Services    Frequency Min 3X/week   Plan      Precautions / Restrictions Precautions Precautions: Fall Restrictions Weight Bearing Restrictions: No   Pertinent Vitals/Pain 8/10 in abdomen.  Pt repositioned.    Mobility  Bed Mobility Bed Mobility: Supine to Sit Supine to Sit: 3: Mod assist;HOB flat Details for Bed Mobility Assistance: Assist for bilateral LEs and trunk to translate to midline.  Cues for sequence. Transfers Transfers: Sit to Stand;Stand to Sit Sit to Stand: 4: Min assist;With upper extremity assist;From bed Stand to Sit: 4: Min assist;With upper extremity assist;To chair/3-in-1 Details for Transfer Assistance: Assist for balance and to translate trunk anterior over BOS.  Cues for sequence. Ambulation/Gait Ambulation/Gait Assistance: 4: Min assist Ambulation Distance (Feet): 5 Feet Assistive device: None Ambulation/Gait Assistance Details: Assist for balance and tall posture with cues for sequence.  Distance limited by patient fatigue. Gait Pattern: Step-through pattern;Trunk flexed Stairs: No Wheelchair Mobility Wheelchair Mobility: No    Exercises     PT Diagnosis: Generalized weakness  PT Problem List: Decreased strength;Decreased activity tolerance;Decreased balance;Decreased mobility;Decreased knowledge of use of DME;Pain PT Treatment Interventions: DME instruction;Gait training;Functional mobility training;Therapeutic activities;Balance training;Patient/family education   PT Goals Acute Rehab PT Goals PT Goal Formulation: With  patient Time For Goal Achievement: 09/01/11 Potential to Achieve Goals: Good Pt will go Supine/Side to Sit: with supervision PT Goal: Supine/Side to Sit - Progress: Goal set today Pt will go Sit to Supine/Side: with supervision PT Goal: Sit to Supine/Side - Progress: Goal set today Pt will go Sit to Stand: with supervision PT Goal: Sit to Stand - Progress: Goal set today Pt will go Stand to Sit: with supervision PT Goal: Stand to Sit - Progress: Goal set today Pt will Transfer Bed to Chair/Chair to Bed: with supervision PT Transfer Goal: Bed to Chair/Chair to Bed - Progress: Goal set today Pt will Ambulate: 51 - 150 feet;with supervision;with least restrictive assistive device PT Goal: Ambulate - Progress: Goal set today Pt will Perform Home Exercise Program: Independently PT Goal: Perform Home Exercise Program - Progress: Goal set today  Visit Information  Last PT Received On: 08/18/11 Assistance Needed: +1    Subjective Data  Subjective: "We can do it." Patient Stated Goal: Get well.   Cognition  Overall Cognitive Status: Appears within functional limits for tasks assessed/performed Arousal/Alertness: Awake/alert Orientation Level: Appears intact for tasks assessed Behavior During Session: [facility] for tasks performed    Balance  Balance Balance Assessed: No  End of Session PT - End of Session Equipment Utilized During Treatment: Gait belt Activity Tolerance: Patient limited by fatigue;Patient tolerated treatment well Patient left: in chair;with call bell/phone within reach Nurse Communication: Mobility status    Cephus Shelling 08/18/2011, 9:41 AM  08/18/2011 Cephus Shelling, PT, DPT [phone]

## 2011-08-18 NOTE — Progress Notes (Signed)
Complete bath given and hair fully shampooed with hot water and shampoo per patient and family request. Felizardo Hoffmann Melida Quitter

## 2011-08-19 DIAGNOSIS — G9341 Metabolic encephalopathy: Secondary | ICD-10-CM

## 2011-08-19 DIAGNOSIS — J9600 Acute respiratory failure, unspecified whether with hypoxia or hypercapnia: Secondary | ICD-10-CM

## 2011-08-19 DIAGNOSIS — R5381 Other malaise: Secondary | ICD-10-CM

## 2011-08-19 DIAGNOSIS — G92 Toxic encephalopathy: Secondary | ICD-10-CM

## 2011-08-19 LAB — BASIC METABOLIC PANEL
BUN: 21 mg/dL (ref 6–23)
Creatinine, Ser: 0.5 mg/dL (ref 0.50–1.10)
GFR calc Af Amer: >90 mL/min (ref >90)
GFR calc non Af Amer: >90 mL/min (ref >90)
Potassium: 3.8 mEq/L (ref 3.5–5.1)

## 2011-08-19 LAB — BLOOD GAS, ARTERIAL
Drawn by: 320991
O2 Content: 2 L/min
Patient temperature: 98.6
TCO2: 36.7 mmol/L (ref 0–100)
pH, Arterial: 7.463 — ABNORMAL HIGH (ref 7.350–7.400)

## 2011-08-19 LAB — CBC
MCHC: 31.3 g/dL (ref 30.0–36.0)
RDW: 13.4 % (ref 11.5–15.5)
WBC: 13.8 10*3/uL — ABNORMAL HIGH (ref 4.0–10.5)

## 2011-08-19 MED ORDER — DEXTROSE 5 % IV SOLN
1.0000 g | Freq: Two times a day (BID) | INTRAVENOUS | Status: AC
  Administered 2011-08-19 – 2011-08-23 (×10): 1 g via INTRAVENOUS
  Filled 2011-08-19 (×11): qty 1

## 2011-08-19 MED ORDER — HYDROCODONE-ACETAMINOPHEN 5-325 MG PO TABS
1.0000 | ORAL_TABLET | Freq: Four times a day (QID) | ORAL | Status: DC | PRN
  Administered 2011-08-19: 1 via ORAL
  Administered 2011-08-19 – 2011-08-20 (×2): 2 via ORAL
  Administered 2011-08-22 – 2011-08-24 (×6): 1 via ORAL
  Administered 2011-08-25 – 2011-08-27 (×7): 2 via ORAL
  Administered 2011-08-28 – 2011-08-29 (×3): 1 via ORAL
  Administered 2011-08-29: 2 via ORAL
  Administered 2011-08-29: 1 via ORAL
  Administered 2011-08-30 – 2011-09-01 (×6): 2 via ORAL
  Filled 2011-08-19 (×4): qty 2
  Filled 2011-08-19 (×3): qty 1
  Filled 2011-08-19: qty 2
  Filled 2011-08-19 (×2): qty 1
  Filled 2011-08-19 (×2): qty 2
  Filled 2011-08-19: qty 1
  Filled 2011-08-19 (×5): qty 2
  Filled 2011-08-19: qty 1
  Filled 2011-08-19: qty 2
  Filled 2011-08-19: qty 1
  Filled 2011-08-19: qty 2
  Filled 2011-08-19: qty 1
  Filled 2011-08-19 (×2): qty 2
  Filled 2011-08-19: qty 1
  Filled 2011-08-19: qty 2
  Filled 2011-08-19: qty 1

## 2011-08-19 MED ORDER — BISOPROLOL FUMARATE 5 MG PO TABS
5.0000 mg | ORAL_TABLET | Freq: Every day | ORAL | Status: DC
  Administered 2011-08-19 – 2011-09-01 (×14): 5 mg via ORAL
  Filled 2011-08-19 (×14): qty 1

## 2011-08-19 MED ORDER — HALOPERIDOL LACTATE 5 MG/ML IJ SOLN
3.0000 mg | Freq: Four times a day (QID) | INTRAMUSCULAR | Status: DC
  Administered 2011-08-19: 18:00:00 via INTRAVENOUS
  Administered 2011-08-19 – 2011-08-20 (×2): 3 mg via INTRAVENOUS
  Administered 2011-08-20: 12:00:00 via INTRAVENOUS
  Filled 2011-08-19 (×8): qty 0.6

## 2011-08-19 NOTE — Progress Notes (Signed)
Clinical Social Work Department BRIEF PSYCHOSOCIAL ASSESSMENT 08/19/2011  Patient:  Natalie [name], Natalie [name]     Account Number:  [id number]     Admit date:  08/14/2011  Clinical Social Worker:  January Bergthold Lubertha Basque  Date/Time:  08/18/2011 01:05 PM  Referred by:  Physician  Date Referred:  08/18/2011 Referred for  SNF Placement   Other Referral:   Interview type:  Family Other interview type:    PSYCHOSOCIAL DATA Living Status:  FACILITY Admitted from facility:  [facility] Level of care:  Skilled Nursing Facility Primary support name:  [phone] Primary support relationship to patient:  CHILD, ADULT Degree of support available:   Adequate.    CURRENT CONCERNS Current Concerns  Post-Acute Placement   Other Concerns:    SOCIAL WORK ASSESSMENT / PLAN Clinical Social Worker recieved notification from RN that pt's dtr wished to speak with CSW.  CSW phoned pt's dtr on her cell phone-[phone]- CSW introduced self and explained role.  CSW offered emotional support.  CSW provided safe space for dtr to process feelings; dtr feels that health professionals are being "lazy" by "drugging my mom".  CSW inquired about what avenue dtr feels would be sufficient; dtr would like pt to dc to a "hollistic and wellness" facility where pt would recieve help for her "possible addiction to pain killers".  CSW provided emotional support and explained that there are several avenues the hospital evaluates when discussing dc planning; SNF, CIR, HH.  Dtr expressed interest in CIR and HH but is open to pt being faxed out "just for informaiton".  CSW to continue to follow and assist as needed.   Assessment/plan status:   Other assessment/ plan:   Information/referral to community resources:    PATIENT'S/FAMILY'S RESPONSE TO PLAN OF CARE:   Sabino Niemann, MSW, LCSWA [phone]

## 2011-08-19 NOTE — Progress Notes (Signed)
ANTIBIOTIC CONSULT NOTE - FOLLOW UP  Pharmacy Consult for Cefepime Indication: pseudomonas UTI  Allergies  Allergen Reactions  . Levofloxacin Other (See Comments)    REACTION: unspecified per [facility]    Patient Measurements: Height: 5\' 2"  (157.5 cm) Weight: 140 lb 3.4 oz (63.6 kg) (bed wt) IBW/kg (Calculated) : 50.1   Vital Signs: Temp: 97.8 F (36.6 C) (05/29 0524) Temp src: Oral (05/29 0524) BP: 143/103 mmHg (05/29 0524) Pulse Rate: 83  (05/29 0524) Intake/Output from previous day: 05/28 0701 - 05/29 0700 In: 720 [P.O.:720] Out: 600 [Urine:600] Intake/Output from this shift: Total I/O In: 120 [P.O.:120] Out: -   Labs:  Basename 08/19/11 0546 08/18/11 0409 08/17/11 0415  WBC 13.8* 9.4 10.0  HGB 11.5* 11.0* 10.4*  PLT 252 237 226  LABCREA -- -- --  CREATININE 0.50 0.59 0.49*   Estimated Creatinine Clearance: 50.8 ml/min (by C-G formula based on Cr of 0.5).    Microbiology and Antibiotic History:  5/25 BC>>ngtd 5/25 UC>>80K Pseudomonas >> pan sens (cefepime, ceftaz, cipro, zosyn) 5/25 resp culture>> normal flora 5/25 MRSA PCR: neg  5/25 Vanc >> 5/27 5/25 Zosyn >> 5/28 5/28 PO doxy>>5/29 5/29 Cefepime>>  Assessment: 78 yo F transferred from Rehab with presumed PNA found to have pseudomonas UTI, pan sensitive.  To start Cefepime.  SCr 0.5, estimated CrCl ~ 50.  Afebrile, WBC 13.8.  Goal of Therapy:  Renal adjustment of medications  Plan:  Cefepime 1gm IV Q12h. Follow CrCl.  May need to adjust dose to q24h dosing if CrCl falls to <50 ml/min.  [employer], Pharm.D., BCPS Clinical Pharmacist Pager [phone] 08/19/2011 10:21 AM

## 2011-08-19 NOTE — Progress Notes (Signed)
Pt refused to ambulate.  Only sat on side of bed for few seconds and went back to bed which she tolerated well.  Natalie [name], Charity fundraiser.

## 2011-08-19 NOTE — Progress Notes (Signed)
Notified MD of pt's ABG results. Order given to decrease O2 to 1 L via [employer]. Will continue to monitor pt closely.  Juliane Lack, RN

## 2011-08-19 NOTE — Consult Note (Signed)
Physical Medicine and Rehabilitation Consult Reason for Consult: deconditioning  Referring Phsyician:  Dr. Marchelle Gearing   HPI:  Natalie [name] is an 78 y.o. female former smoker from [employer], admitted on 08/14/2011 with acute metabolic encephalopathy from acute on chronic hypercapnic respiratory failure with VDRF. She was in hospital until April 30 for GI bleed from gastric ulcer and in rehab due to deconditioning. She was tx for PNA in rehab.  PMHx HTN, Hypothyroidism, PUD, A fib, COPD, Anxiety, Arthritis, Lt renal mass, Trigeminal neuralgia and chronic pain. Patient intubated in ED and CCM consulted for management.  Patient started on IV antibiotics for AECOPD in setting of benzo, narcotic use. BLE dopplers negative for DVT. Self extubated 05/26. Patient has had issues with delirium requiring haldol.  Family with concerns about patient's "addictions"  PT evaluation done yesterday.  MD, Family requesting CIR.   Review of Systems  Unable to perform ROS: other   Past Medical History  Diagnosis Date  . Osteoporosis   . Hypertension   . Hypothyroidism   . Lower GI bleeding 07/21/11  . Atrial fibrillation   . COPD (chronic obstructive pulmonary disease)   . Pneumonia   . Shortness of breath 07/21/11    "anytime"  . Sinus headache 07/21/11    "all the time"  . Bladder infection, chronic   . Arthritis     "everywhere"  . Anxiety   . Chronic lower back pain   . Chronic abdominal pain   . Renal mass, left 2013  . PUD (peptic ulcer disease)   . Chronic cystitis   . Trigeminal neuralgia     /E-chart  . Chronic kidney disease    Past Surgical History  Procedure Date  . Vaginal hysterectomy     partial  . Cataract extraction w/ intraocular lens  implant, bilateral   . Percutaneous pinning femoral neck fracture 11/2003    w/closed reduction ; left/E-chart  . Laceration repair 11/2003    left eyebrow  . Bilateral salpingoophorectomy 12/2005    lap/E-chart  . Cholecystectomy 1960   . Appendectomy     presumed/E-chart  . Esophagogastroduodenoscopy 07/23/2011    Procedure: ESOPHAGOGASTRODUODENOSCOPY (EGD);  Surgeon: Graylin Shiver, MD;  Location: [facility] ENDOSCOPY;  Service: Endoscopy;  Laterality: N/A;   History reviewed. No pertinent family history.  Social History:  reports that she has quit smoking. Her smoking use included Cigarettes. She has never used smokeless tobacco. She reports that she drinks alcohol. She reports that she does not use illicit drugs.   Allergies  Allergen Reactions  . Levofloxacin Other (See Comments)    REACTION: unspecified per [facility]   Medications Prior to Admission  Medication Sig Dispense Refill  . amLODipine (NORVASC) 2.5 MG tablet Take 2.5 mg by mouth daily.      . benazepril (LOTENSIN) 40 MG tablet Take 40 mg by mouth daily.        Marland Kitchen bismuth subsalicylate (KAOPECTATE) 262 MG/15ML suspension Take 30 mLs by mouth every 6 (six) hours as needed. For diarrhea      . calcium citrate (CALCITRATE - DOSED IN MG ELEMENTAL CALCIUM) 950 MG tablet Take 1 tablet by mouth 2 (two) times daily.        . carvedilol (COREG) 12.5 MG tablet Take 12.5 mg by mouth 2 (two) times daily with a meal.        . dicyclomine (BENTYL) 10 MG capsule Take 10 mg by mouth 4 (four) times daily -  before meals and at bedtime.        Marland Kitchen  escitalopram (LEXAPRO) 10 MG tablet Take 10 mg by mouth every evening.      Marland Kitchen HYDROcodone-acetaminophen (VICODIN) 5-500 MG per tablet Take 1 tablet by mouth daily as needed. For pain      . lansoprazole (PREVACID) 30 MG capsule Take 30 mg by mouth 2 (two) times daily before a meal.      . levothyroxine (SYNTHROID, LEVOTHROID) 75 MCG tablet Take 75 mcg by mouth daily.      Marland Kitchen LORazepam (ATIVAN) 0.5 MG tablet Take 0.5 mg by mouth 2 (two) times daily.      . montelukast (SINGULAIR) 10 MG tablet Take 10 mg by mouth daily.        Home: Home Living Lives With: Daughter Available Help at Discharge: Family;Available 24 hours/day Type of Home:  House Home Access: Stairs to enter [employer] of Steps: 3 Entrance Stairs-Rails: Can reach both;Right;Left Home Layout: Two level;Able to live on main level with bedroom/bathroom Alternate Level Stairs-Number of Steps: her bedroom is on the second floor but she reports she can sleep downstairs if necessary; one flight of stairs to go up to her bedroom Alternate Level Stairs-Rails: Right;Left;Can reach both Home Adaptive Equipment: Walker - rolling;Straight cane  Functional History: Prior Function Able to Take Stairs?: Yes Driving: No Vocation: Retired Functional Status:  Mobility: Bed Mobility Bed Mobility: Supine to Sit Supine to Sit: 3: Mod assist;HOB flat Transfers Transfers: Sit to Stand;Stand to Sit Sit to Stand: 4: Min assist;With upper extremity assist;From bed Stand to Sit: 4: Min assist;With upper extremity assist;To chair/3-in-1 Ambulation/Gait Ambulation/Gait Assistance: 4: Min assist Ambulation Distance (Feet): 5 Feet Assistive device: None Ambulation/Gait Assistance Details: Assist for balance and tall posture with cues for sequence.  Distance limited by patient fatigue. Gait Pattern: Step-through pattern;Trunk flexed Stairs: No Wheelchair Mobility Wheelchair Mobility: No  ADL:    Cognition: Cognition Arousal/Alertness: Awake/alert Orientation Level: Oriented to person;Disoriented to place;Disoriented to time;Disoriented to situation Cognition Overall Cognitive Status: Appears within functional limits for tasks assessed/performed Arousal/Alertness: Awake/alert Orientation Level: Appears intact for tasks assessed Behavior During Session: [facility] for tasks performed  Blood pressure 166/106, pulse 120, temperature 98 F (36.7 C), temperature source Oral, resp. rate 20, height 5\' 2"  (1.575 m), weight 63.6 kg (140 lb 3.4 oz), SpO2 95.00%. .Physical Exam  Nursing note and vitals reviewed. Constitutional: She appears well-developed.  HENT:  Head:  Normocephalic and atraumatic.  Right Ear: External ear normal.  Left Ear: External ear normal.  Nose: Nose normal.  Mouth/Throat: Oropharynx is clear and moist.  Eyes: Conjunctivae and EOM are normal. Pupils are equal, round, and reactive to light.  Neck: Normal range of motion.  Cardiovascular: Normal rate, regular rhythm, normal heart sounds and intact distal pulses.   Pulmonary/Chest: Effort normal and breath sounds normal.  Abdominal: Soft. Bowel sounds are normal.  Neurological: She is alert.       Moves all 4's. No focal weakness or sensory deficits. Limited insight and awareness. Poor attention  Psychiatric: Her mood appears anxious. She is agitated and aggressive. Cognition and memory are not impaired. She exhibits normal recent memory and normal remote memory.    Results for orders placed during the hospital encounter of 08/14/11 (from the past 24 hour(s))  BASIC METABOLIC PANEL     Status: Abnormal   Collection Time   08/19/11  5:46 AM      Component Value Range   Sodium 138  135 - 145 (mEq/L)   Potassium 3.8  3.5 - 5.1 (mEq/L)  Chloride 94 (*) 96 - 112 (mEq/L)   CO2 39 (*) 19 - 32 (mEq/L)   Glucose, Bld 89  70 - 99 (mg/dL)   BUN 21  6 - 23 (mg/dL)   Creatinine, Ser 1.61  0.50 - 1.10 (mg/dL)   Calcium 9.1  8.4 - 09.6 (mg/dL)   GFR calc non Af Amer >90  >90 (mL/min)   GFR calc Af Amer >90  >90 (mL/min)  CBC     Status: Abnormal   Collection Time   08/19/11  5:46 AM      Component Value Range   WBC 13.8 (*) 4.0 - 10.5 (K/uL)   RBC 4.08  3.87 - 5.11 (MIL/uL)   Hemoglobin 11.5 (*) 12.0 - 15.0 (g/dL)   HCT 04.5  40.9 - 81.1 (%)   MCV 90.2  78.0 - 100.0 (fL)   MCH 28.2  26.0 - 34.0 (pg)   MCHC 31.3  30.0 - 36.0 (g/dL)   RDW 91.4  78.2 - 95.6 (%)   Platelets 252  150 - 400 (K/uL)  BLOOD GAS, ARTERIAL     Status: Abnormal   Collection Time   08/19/11 11:25 AM      Component Value Range   O2 Content 2.0     Delivery systems NASAL CANNULA     pH, Arterial 7.463 (*)  7.350 - 7.400    pCO2 arterial 49.8 (*) 35.0 - 45.0 (mmHg)   pO2, Arterial 102.0 (*) 80.0 - 100.0 (mmHg)   Bicarbonate 35.2 (*) 20.0 - 24.0 (mEq/L)   TCO2 36.7  0 - 100 (mmol/L)   Acid-Base Excess 10.8 (*) 0.0 - 2.0 (mmol/L)   O2 Saturation 98.7     Patient temperature 98.6     Collection site RIGHT RADIAL     Drawn by [id number]     Sample type ARTERIAL DRAW     Allens test (pass/fail) PASS  PASS    Dg Abd Portable 1v  08/18/2011  *RADIOLOGY REPORT*  Clinical Data: 78 year old female with abdominal pain and diarrhea.  PORTABLE ABDOMEN - 1 VIEW  Comparison: CT abdomen pelvis 07/21/2011 and earlier.  Findings: Portable AP view at 1839 hours. Nonobstructed bowel gas pattern.  Stable postoperative changes to the visualized proximal left femur. Stable visualized osseous structures.  Probable small bilateral pleural effusions.  Vascular calcifications.  No definite pneumoperitoneum on this supine view.  Stable right nephrocalcinosis.  IMPRESSION: Nonobstructed bowel gas pattern.  Probable small pleural effusions.  Original Report Authenticated By: Harley Hallmark, M.D.    Assessment/Plan: Diagnosis: Deconditioning after respiratory failure. Toxic encephalopathy 1. Does the need for close, 24 hr/day medical supervision in concert with the patient's rehab needs make it unreasonable for this patient to be served in a less intensive setting? No 2. Co-Morbidities requiring supervision/potential complications: A. fib, recent UTI, anemia 3. Due to bladder management, bowel management, safety, skin/wound care, disease management, medication administration, pain management and patient education, does the patient require 24 hr/day rehab nursing? Potentially 4. Does the patient require coordinated care of a physician, rehab nurse, PT (1-2 hrs/day, 5 days/week), OT (1-2 hrs/day, 5 days/week) and SLP (1 hrs/day, 5 days/week) to address physical and functional deficits in the context of the above medical  diagnosis(es)? No Addressing deficits in the following areas: balance, endurance, locomotion, strength, transferring, bowel/bladder control, bathing, dressing, feeding, grooming, toileting, cognition and psychosocial support 5. Can the patient actively participate in an intensive therapy program of at least 3 hrs of therapy per day at least  5 days per week? No 6. The potential for patient to make measurable gains while on inpatient rehab is poor 7. Anticipated functional outcomes upon discharge from inpatient rehab are n/a. 8. Estimated rehab length of stay to reach the above functional goals is: n/a 9. Does the patient have adequate social supports to accommodate these discharge functional goals? Potentially 10. Anticipated D/C setting: Home 11. Anticipated post D/C treatments: HH therapy 12. Overall Rehab/Functional Prognosis: fair  RECOMMENDATIONS: This patient's condition is appropriate for continued rehabilitative care in the following setting: SNF Patient has agreed to participate in recommended program. n/a Note that insurance prior authorization may be required for reimbursement for recommended care.  Comment: Patient with significant cognitive and behavioral issues. Would be best suited for a lower intensity setting.   Ranelle Oyster M.D. 08/19/2011

## 2011-08-19 NOTE — Progress Notes (Signed)
Name: Natalie [name] MRN: [id number] DOB: 02-01-1931    LOS: 5 Date of admit 08/14/2011 11:10 PM   PULMONARY / CRITICAL CARE MEDICINE  HPI:  78 yo female former smoker lived at home until April 2013 when she was admitted for GI bleed. Then to Rehab and then admitted on 08/14/2011 with acute metabolic encephalopathy from acute on chronic hypercapnic respiratory failure with VDRF.  [name], narcotic use.  She was in hospital until April 30 for GI bleed from gastric ulcer and in rehab due to deconditioning.  She was tx for PNA in rehab. PMHx HTN, Hypothyroidism, PUD, A fib, COPD, Anxiety, Arthritis, Lt renal mass, Trigeminal neuralgia  Current Status:  SUBJECTIVE/OVERNIGHT/INTERVAL HX  RN says dtr upset that no MD has updated patient in several days. Per chart review and d/w Dr Craige Cotta - family against narcos and esp benzo but patient wanting it  RN says patient intermittently is here for weight loss surgery  To MD: patient c./o chronic pain in RUQ and saying that MD does not care for her and declining to answer any question by saying "do not ask me any questions"    Vital Signs: BP 143/103  Pulse 83  Temp(Src) 97.8 F (36.6 C) (Oral)  Resp 18  Ht 5\' 2"  (1.575 m)  Wt 63.6 kg (140 lb 3.4 oz)  BMI 25.65 kg/m2  SpO2 94%   Intake/Output Summary (Last 24 hours) at 08/19/11 0949 Last data filed at 08/19/11 0537  Gross per 24 hour  Intake    360 ml  Output    600 ml  Net   -240 ml   Physical Exam: General - no distress, cachectic. Lying in bed, calm HEENT - no sinus tenderness Cardiac - irregular, no murmur Chest - prolonged exhalation, no wheeze/rales Abd - soft, non-tender Ext - no edema Neuro - Awake, no distress,normal strength, Unclear if delirious - declined CAM-ICU test. RASS +1  CBC    Component Value Date/Time   WBC 13.8* 08/19/2011 0546   RBC 4.08 08/19/2011 0546   HGB 11.5* 08/19/2011 0546   HCT 36.8 08/19/2011 0546   PLT 252  08/19/2011 0546   MCV 90.2 08/19/2011 0546   MCH 28.2 08/19/2011 0546   MCHC 31.3 08/19/2011 0546   RDW 13.4 08/19/2011 0546   LYMPHSABS 1.0 08/14/2011 2345   MONOABS 0.3 08/14/2011 2345   EOSABS 0.0 08/14/2011 2345   BASOSABS 0.0 08/14/2011 2345   BMET    Component Value Date/Time   NA 138 08/19/2011 0546   K 3.8 08/19/2011 0546   CL 94* 08/19/2011 0546   CO2 39* 08/19/2011 0546   GLUCOSE 89 08/19/2011 0546   BUN 21 08/19/2011 0546   CREATININE 0.50 08/19/2011 0546   CALCIUM 9.1 08/19/2011 0546   GFRNONAA >90 08/19/2011 0546   GFRAA >90 08/19/2011 0546    Lab Results  Component Value Date   ALT 30 08/14/2011   AST 27 08/14/2011   ALKPHOS 52 08/14/2011   BILITOT 0.2* 08/14/2011   ABG    Component Value Date/Time   PHART 7.529* 08/16/2011 0805   PCO2ART 47.2* 08/16/2011 0805   PO2ART 99.0 08/16/2011 0805   HCO3 39.3* 08/16/2011 0805   TCO2 41 08/16/2011 0805   O2SAT 98.0 08/16/2011 0805    Lab 08/16/11 0805 08/15/11 0527 08/15/11 0130 08/15/11 0015  PHART 7.529* 7.434* 7.196* 7.148*  PCO2ART 47.2* 56.6* 111.2* 129.6*  PO2ART 99.0 142.0* 73.0* 141.0*  HCO3 39.3*  37.9* 43.1* 45.0*  TCO2 41 40 46 49  O2SAT 98.0 99.0 88.0 98.0      Dg Abd Portable 1v  08/18/2011  *RADIOLOGY REPORT*  Clinical Data: 78 year old female with abdominal pain and diarrhea.  PORTABLE ABDOMEN - 1 VIEW  Comparison: CT abdomen pelvis 07/21/2011 and earlier.  Findings: Portable AP view at 1839 hours. Nonobstructed bowel gas pattern.  Stable postoperative changes to the visualized proximal left femur. Stable visualized osseous structures.  Probable small bilateral pleural effusions.  Vascular calcifications.  No definite pneumoperitoneum on this supine view.  Stable right nephrocalcinosis.  IMPRESSION: Nonobstructed bowel gas pattern.  Probable small pleural effusions.  Original Report Authenticated By: Harley Hallmark, M.D.    Lab 08/14/11 2345  PROBNP 1968.0*    No results found for this basename: TROPONINI:5 in the  last 168 hours No results found for this basename: PROCALCITON:5 in the last 168 hours    ASSESSMENT AND PLAN  PULMONARY ETT 5/24>>5/26  Acute on chronic hypoxic, hypercapnic respiratory failure from AECOPD in setting of narcotic/benzo use.   -on 5/29: stable resp status. On exam  Plan: -Continued duoneb -change solumedrol to prednisone -f/u CXR intermittently - check ABG  CARDIOVASCULAR  Lab 08/14/11 2345  PROBNP 1968.0*    Echo 5/25>>EF 65 to 70%, mild MR, lipomatous hypertrophy of atrial septum Doppler legs 5/25>>no DVT  Hx of A fib>>rate controlled.  No coumadin due to recent upper GI bleed.  Plan: -Monitor heart rate on tele -May need input from GI if/when coumadin can be resumed, or whether ASA would be better alternative. - check BNP 5/30 and consider lasix  -   DC home med coreg (in setting of copd and diastolic chf) - use bisoprolol instead  Hx of HTN Plan: -Monitor BP.  RENAL Hx of renal mass>>seen on CT abd/pelvis 07/21/11 Plan: -Will need further assessment when more stable>>can be done as outpt  HEMATOLOGIC  Lab 08/19/11 0546 08/18/11 0409 08/17/11 0415  HGB 11.5* 11.0* 10.4*  HCT 36.8 34.8* 32.4*  WBC 13.8* 9.4 10.0  PLT 252 237 226  Anemia with recent hx of GI bleed.  No evidence for bleeding at this time.  Plan: -F/u CBC -Transfuse for Hb < 7  GASTROINTESTINAL Gastric ulcer with upper GI bleed in April 2013. Plan: -BID protonix  Nutrition. Plan: -advance diet as tolerated  INFECTIOUS Cultures: Blood 5/25>>NTD Urine 5/25>>GNR - PSEUDOMONAS Sputum 5/25>>Reincubated on 5/27  Results for orders placed during the hospital encounter of 08/14/11  CULTURE, BLOOD (ROUTINE X 2)     Status: Normal (Preliminary result)   Collection Time   08/15/11 12:15 AM      Component Value Range Status Comment   Specimen Description BLOOD RIGHT HAND   Final    Special Requests BOTTLES DRAWN AEROBIC AND ANAEROBIC 5CC EACH   Final    Culture  Setup  Time [id number]   Final    Culture     Final    Value:        BLOOD CULTURE RECEIVED NO GROWTH TO DATE CULTURE WILL BE HELD FOR 5 DAYS BEFORE ISSUING A FINAL NEGATIVE REPORT   Report Status PENDING   Incomplete   CULTURE, BLOOD (ROUTINE X 2)     Status: Normal (Preliminary result)   Collection Time   08/15/11 12:30 AM      Component Value Range Status Comment   Specimen Description BLOOD LEFT HAND   Final    Special Requests BOTTLES DRAWN AEROBIC ONLY 5CC  Final    Culture  Setup Time [id number]   Final    Culture     Final    Value:        BLOOD CULTURE RECEIVED NO GROWTH TO DATE CULTURE WILL BE HELD FOR 5 DAYS BEFORE ISSUING A FINAL NEGATIVE REPORT   Report Status PENDING   Incomplete   URINE CULTURE     Status: Normal   Collection Time   08/15/11  1:19 AM      Component Value Range Status Comment   Specimen Description URINE, CATHETERIZED   Final    Special Requests NONE   Final    Culture  Setup Time [id number]   Final    Colony Count 80,000 COLONIES/ML   Final    Culture PSEUDOMONAS AERUGINOSA   Final    Report Status 08/17/2011 FINAL   Final    Organism ID, Bacteria PSEUDOMONAS AERUGINOSA   Final   CULTURE, RESPIRATORY     Status: Normal   Collection Time   08/15/11  9:30 AM      Component Value Range Status Comment   Specimen Description TRACHEAL ASPIRATE   Final    Special Requests NONE   Final    Gram Stain     Final    Value: RARE WBC PRESENT, PREDOMINANTLY PMN     NO SQUAMOUS EPITHELIAL CELLS SEEN     NO ORGANISMS SEEN   Culture Non-Pathogenic Oropharyngeal-type Flora Isolated.   Final    Report Status 08/17/2011 FINAL   Final   MRSA PCR SCREENING     Status: Normal   Collection Time   08/15/11  9:31 AM      Component Value Range Status Comment   MRSA by PCR NEGATIVE  NEGATIVE  Final       Antibiotics: Vancomycin 05/25>>5/27 Zosyn 5/25>>5/28 Doxycycline 5/28>>5/29  Anti-infectives     Start     Dose/Rate Route Frequency Ordered Stop   08/18/11 1300    doxycycline (VIBRA-TABS) tablet 100 mg        100 mg Oral Every 12 hours 08/18/11 1138     08/15/11 1200   vancomycin (VANCOCIN) 500 mg in sodium chloride 0.9 % 100 mL IVPB  Status:  Discontinued        500 mg 100 mL/hr over 60 Minutes Intravenous Every 12 hours 08/15/11 0458 08/17/11 1018   08/15/11 0800   piperacillin-tazobactam (ZOSYN) IVPB 3.375 g  Status:  Discontinued        3.375 g 12.5 mL/hr over 240 Minutes Intravenous Every 8 hours 08/15/11 0458 08/18/11 1138   08/15/11 0015   vancomycin (VANCOCIN) IVPB 1000 mg/200 mL premix        1,000 mg 200 mL/hr over 60 Minutes Intravenous  Once 08/15/11 0005 08/15/11 0133   08/15/11 0015  piperacillin-tazobactam (ZOSYN) IVPB 3.375 g       3.375 g 12.5 mL/hr over 240 Minutes Intravenous  Once 08/15/11 0005 08/15/11 0433          Possible sources of infection are HCAP or UTI. Plan: -D4/x Abx -change to doxycycline 5/28 -> change to pseudomonal UTI coverage 5/29   NEUROLOGIC Chronic pain, anxiety   -on 5/29 - delirium +. Dtr says delirium is new since admit and is new for this hospitalization. WAs not present prior to admit    Plan: -Continue current regimen but change all to PRN. - DC morphine due to delirium - Rx pseudomonas UTI - check ABG to look for hypercarbia worsening -  Rx Haldol starting 08/19/2011  ENDOCRINE Hx of hypothyroidism Plan: -synthroid 75 mcg daily   BEST PRACTICE / DISPOSITION - Level of Care:  Tele - Primary Service: PCCM (she only wants PCCM on board as primary) - Code Status:  Full code - Diet:  Heart healthy - DVT Px:  SCDs - GI Px:  Protonix - Consults: none  - Family: Dr Marchelle Gearing on 08/19/2011 d/w daughter Antony Odea [phone] (main contact). She is very upset at overall healthcare deliver of her mom. Feels that SNF since April 2013 not encouraging rehab and instead wants to "shut patient up" by giving benzo and narcotics. She feels this led to vicious cycle of pain and further  deconditioning. She does not ever want her mom to go to out of faacility snf or rehab. [facility]. Currently VERY concerned about ongoing delirium. Discussed ddx of delirium with her including risk due to baseline benzo, narcs, age, and ICU illness and morphine and UTI and possibly hypercarbia. We discussed care path for this and she is supportive of plan outline below and appreciative.  - Dispo: get inpatient rehab consult    Dr. Kalman Shan, M.D., [facility].C.P Pulmonary and Critical Care Medicine Staff Physician [facility] [place] [facility] Pager: [phone], If no answer or between  15:00h - 7:00h: call [phone]  08/19/2011 10:02 AM

## 2011-08-19 NOTE — Progress Notes (Signed)
CSW received a referral for SNF. CSW met with patient at bedside and patient said, "get out of here now". Patient did give CSW permission to speak with her daughter. CSW will follow up with patient's daughter.   Sabino Niemann, MSW, [employer] [phone]

## 2011-08-20 LAB — CBC
HCT: 33.9 % — ABNORMAL LOW (ref 36.0–46.0)
MCHC: 32.2 g/dL (ref 30.0–36.0)
Platelets: 229 10*3/uL (ref 150–400)
RDW: 13.3 % (ref 11.5–15.5)

## 2011-08-20 LAB — BASIC METABOLIC PANEL
CO2: 34 mEq/L — ABNORMAL HIGH (ref 19–32)
Chloride: 90 mEq/L — ABNORMAL LOW (ref 96–112)
Chloride: 92 mEq/L — ABNORMAL LOW (ref 96–112)
GFR calc Af Amer: >90 mL/min (ref >90)
GFR calc non Af Amer: >90 mL/min (ref >90)
Glucose, Bld: 181 mg/dL — ABNORMAL HIGH (ref 70–99)
Potassium: 4.1 mEq/L (ref 3.5–5.1)
Potassium: 3.6 mEq/L (ref 3.5–5.1)
Sodium: 134 mEq/L — ABNORMAL LOW (ref 135–145)
Sodium: 135 mEq/L (ref 135–145)

## 2011-08-20 LAB — PHOSPHORUS: Phosphorus: 2.6 mg/dL (ref 2.3–4.6)

## 2011-08-20 LAB — TROPONIN I: Troponin I: <0.3 ng/mL (ref <0.30)

## 2011-08-20 MED ORDER — MAGNESIUM SULFATE 40 MG/ML IJ SOLN
2.0000 g | Freq: Once | INTRAMUSCULAR | Status: AC
  Administered 2011-08-20: 2 g via INTRAVENOUS
  Filled 2011-08-20 (×2): qty 50

## 2011-08-20 MED ORDER — HALOPERIDOL LACTATE 5 MG/ML IJ SOLN
1.0000 mg | INTRAMUSCULAR | Status: DC | PRN
  Administered 2011-08-20: 4 mg via INTRAVENOUS
  Filled 2011-08-20: qty 0.8

## 2011-08-20 MED ORDER — QUETIAPINE FUMARATE 25 MG PO TABS
25.0000 mg | ORAL_TABLET | Freq: Every day | ORAL | Status: DC
  Administered 2011-08-20 – 2011-08-27 (×8): 25 mg via ORAL
  Filled 2011-08-20 (×9): qty 1

## 2011-08-20 MED ORDER — FUROSEMIDE 10 MG/ML IJ SOLN
40.0000 mg | Freq: Once | INTRAMUSCULAR | Status: DC
  Filled 2011-08-20: qty 4

## 2011-08-20 MED ORDER — LOSARTAN POTASSIUM 25 MG PO TABS
25.0000 mg | ORAL_TABLET | Freq: Every day | ORAL | Status: DC
  Administered 2011-08-20 – 2011-09-01 (×13): 25 mg via ORAL
  Filled 2011-08-20 (×14): qty 1

## 2011-08-20 MED ORDER — MAGNESIUM SULFATE 50 % IJ SOLN
2.0000 g | Freq: Once | INTRAVENOUS | Status: DC
  Filled 2011-08-20: qty 4

## 2011-08-20 MED ORDER — HALOPERIDOL LACTATE 5 MG/ML IJ SOLN
1.0000 mg | Freq: Four times a day (QID) | INTRAMUSCULAR | Status: DC
  Filled 2011-08-20 (×3): qty 0.2

## 2011-08-20 MED ORDER — FUROSEMIDE 10 MG/ML IJ SOLN
20.0000 mg | Freq: Every day | INTRAMUSCULAR | Status: DC
  Administered 2011-08-20 – 2011-08-24 (×5): 20 mg via INTRAVENOUS
  Filled 2011-08-20 (×5): qty 2

## 2011-08-20 MED ORDER — POTASSIUM CHLORIDE CRYS ER 20 MEQ PO TBCR
40.0000 meq | EXTENDED_RELEASE_TABLET | Freq: Once | ORAL | Status: AC
  Administered 2011-08-20: 40 meq via ORAL
  Filled 2011-08-20: qty 2

## 2011-08-20 NOTE — Progress Notes (Signed)
Name: Natalie [name] MRN: [id number] DOB: February 25, 1931  ELECTRONIC ICU PHYSICIAN NOTE  Problem:  Agitation worse off scheduled haldol  Intervention:  Continue scheduled seroquel but add back prn IV haldol  Sandrea Hughs 08/20/2011, 8:53 PM

## 2011-08-20 NOTE — Progress Notes (Signed)
Pt had a pause of 2.49.  Dr Colletta Maryland informed.  Ordered BMET, Mag, Phos, and Troponin labs.  Pt asymptomatic.  BP150/90, P84.  Will continue to monitor.  Amanda Pea, Charity fundraiser.

## 2011-08-20 NOTE — Progress Notes (Signed)
EKG - QTc 430 ms.  Ordered by Dr. Gordy Levan.  MD notified.  Pt asymptomacti.  Will continue to monitor.  Amanda Pea, Charity fundraiser.

## 2011-08-20 NOTE — Progress Notes (Signed)
Physical Therapy Treatment Patient Details Name: ROANNE HAYE MRN: [id number] DOB: Apr 30, 1930 Today's Date: 08/20/2011 Time: 1050-1105 PT Time Calculation (min): 15 min  PT Assessment / Plan / Recommendation Comments on Treatment Session  Attempted to convince pt to sit up in chair for 1 hour but pt became more upset stating that she could not stand it anymore.  Once session began pt refused to speak saying "I dont want to talk anymore"    Follow Up Recommendations  Skilled nursing facility;Supervision/Assistance - 24 hour    Barriers to Discharge        Equipment Recommendations  Defer to next venue    Recommendations for Other Services OT consult  Frequency Min 2X/week   Plan Discharge plan remains appropriate;Frequency remains appropriate    Precautions / Restrictions Precautions Precautions: Fall Restrictions Weight Bearing Restrictions: No   Pertinent Vitals/Pain Pt c/o abdominal pain 10/10.  RN notified. Attempted to reposition pt for comfort was unsuccessful    Mobility  Bed Mobility Bed Mobility: Sit to Supine Sit to Supine: 4: Min assist;HOB flat Details for Bed Mobility Assistance: Min assist to raise bilateral LEs into the bed  Transfers Transfers: Sit to Stand;Stand to Sit;Stand Pivot Transfers Sit to Stand: 4: Min assist;With upper extremity assist;From bed;From chair/3-in-1;With armrests Stand to Sit: 4: Min assist;With upper extremity assist;To chair/3-in-1 Stand Pivot Transfers: 4: Min assist Details for Transfer Assistance: Assist for balance and to translate trunk anterior over BOS.  Cues for sequence. Ambulation/Gait Ambulation/Gait Assistance: Not tested (comment) Wheelchair Mobility Wheelchair Mobility: No    Exercises     PT Diagnosis:    PT Problem List:   PT Treatment Interventions:     PT Goals Acute Rehab PT Goals Time For Goal Achievement: 09/01/11 Potential to Achieve Goals: Fair Pt will go Supine/Side to Sit: with supervision Pt  will go Sit to Supine/Side: with supervision PT Goal: Sit to Supine/Side - Progress: Progressing toward goal Pt will go Sit to Stand: with supervision PT Goal: Sit to Stand - Progress: Progressing toward goal Pt will go Stand to Sit: with supervision PT Goal: Stand to Sit - Progress: Progressing toward goal Pt will Transfer Bed to Chair/Chair to Bed: with supervision PT Transfer Goal: Bed to Chair/Chair to Bed - Progress: Progressing toward goal Pt will Ambulate: 51 - 150 feet;with supervision;with least restrictive assistive device  Visit Information  Last PT Received On: 08/20/11    Subjective Data  Subjective: I cant stand this pain in my stomach. I want to go back to bed,    Cognition  Overall Cognitive Status: Difficult to assess Difficult to assess due to: Other (comment) (pt not wanting to speak.) Arousal/Alertness: Awake/alert Orientation Level: Appears intact for tasks assessed Behavior During Session: Flat affect    Balance  Balance Balance Assessed: No  End of Session PT - End of Session Equipment Utilized During Treatment: Gait belt Activity Tolerance: Patient limited by fatigue;Patient limited by pain;Other (comment) Patient left: in bed;with call bell/phone within reach;with bed alarm set Nurse Communication: Mobility status    Juwaun Inskeep 08/20/2011, 5:37 PM Lewie Deman L. Naviah Belfield DPT [phone]

## 2011-08-20 NOTE — Progress Notes (Signed)
PT NOTE 08/20/2011  Spoke with case manager about pt's discharge plan. Family would like pt to discharge to home.  At this time pt is min assist with most forms of functional mobility and would be appropriate for  return to home with Home Health and 24/7 supervision.  Acute PT will continue to follow pt.   Adriann Ballweg L. Azul Brumett DPT [phone]

## 2011-08-20 NOTE — Progress Notes (Signed)
Name: Natalie [name] MRN: [id number] DOB: [birth year]    LOS: 6 Date of admit 08/14/2011 11:10 PM   PULMONARY / CRITICAL CARE MEDICINE  HPI:  78 yo female former smoker lived at home until April 2013 when she was admitted for GI bleed. Then to Rehab and then admitted on 08/14/2011 with acute metabolic encephalopathy from acute on chronic hypercapnic respiratory failure with VDRF.  [name], narcotic use.  She was in hospital until April 30 for GI bleed from gastric ulcer and in rehab due to deconditioning.  She was tx for PNA in rehab. PMHx HTN, Hypothyroidism, PUD, A fib, COPD, Anxiety, Arthritis, Lt renal mass, Trigeminal neuralgia    SUBJECTIVE/OVERNIGHT/INTERVAL HX  Patient complains of headache and abdominal pain.  She indicates this is ongoing and no change.    Delirium improved  Had sinus pauses this am and again in PM - mag 1.8 but otherwise labs ok  Inpaitent rehab recommending SNF rehab   Vital Signs: BP 126/89  Pulse 99  Temp(Src) 98.1 F (36.7 C) (Oral)  Resp 20  Ht 5\' 2"  (1.575 m)  Wt 63.2 kg (139 lb 5.3 oz)  BMI 25.48 kg/m2  SpO2 98%   Intake/Output Summary (Last 24 hours) at 08/20/11 1618 Last data filed at 08/20/11 1343  Gross per 24 hour  Intake    720 ml  Output   1850 ml  Net  -1130 ml   Physical Exam: General - no distress, cachectic. Up in chair HEENT - mm pink/moist Cardiac - irregular, no murmur Chest - prolonged exhalation, no wheeze/rales Abd - soft, non-tender Ext - no edema Neuro - Awake, no distress,normal strength, oriented to self, time, knows hospital but incorrect name  staff rounds: RASS +1. CAM-ICU -negative for delirium  CBC  Lab 08/20/11 0540 08/19/11 0546 08/18/11 0409  HGB 10.9* 11.5* 11.0*  HCT 33.9* 36.8 34.8*  WBC 10.6* 13.8* 9.4  PLT 229 252 237    BMET  Lab 08/20/11 0919 08/20/11 0540 08/19/11 2326 08/19/11 0546 08/18/11 0409 08/17/11 0415 08/16/11 0930 08/15/11 0645  NA 135  -- 134* 138 139 138 -- --  K 3.6 -- 4.1 -- -- -- -- --  CL 92* -- 90* 94* 95* 97 -- --  CO2 34* -- 39* 39* 39* 36* -- --  GLUCOSE 181* -- 97 89 123* 123* -- --  BUN 16 -- 19 21 23 23  -- --  CREATININE 0.45* -- 0.48* 0.50 0.59 0.49* -- --  CALCIUM 9.0 -- 8.8 9.1 8.4 8.4 -- --  MG -- 1.8 -- -- 2.1 -- 2.3 2.0  PHOS 2.4 2.6 -- -- 4.5 -- -- 2.9     Lab Results  Component Value Date   ALT 30 08/14/2011   AST 27 08/14/2011   ALKPHOS 52 08/14/2011   BILITOT 0.2* 08/14/2011   ABG  Lab 08/19/11 1125 08/16/11 0805 08/15/11 0527 08/15/11 0130 08/15/11 0015  PHART 7.463* 7.529* 7.434* 7.196* 7.148*  PCO2ART 49.8* 47.2* 56.6* 111.2* 129.6*  PO2ART 102.0* 99.0 142.0* 73.0* 141.0*  HCO3 35.2* 39.3* 37.9* 43.1* 45.0*  TCO2 36.7 41 40 46 49  O2SAT 98.7 98.0 99.0 88.0 98.0    Dg Abd Portable 1v  08/18/2011  *RADIOLOGY REPORT*  Clinical Data: 78 year old female with abdominal pain and diarrhea.  PORTABLE ABDOMEN - 1 VIEW  Comparison: CT abdomen pelvis 07/21/2011 and earlier.  Findings: Portable AP view at 1839 hours. Nonobstructed bowel gas pattern.  Stable  postoperative changes to the visualized proximal left femur. Stable visualized osseous structures.  Probable small bilateral pleural effusions.  Vascular calcifications.  No definite pneumoperitoneum on this supine view.  Stable right nephrocalcinosis.  IMPRESSION: Nonobstructed bowel gas pattern.  Probable small pleural effusions.  Original Report Authenticated By: Harley Hallmark, M.D.    Lab 08/20/11 0540 08/14/11 2345  PROBNP 1485.0* 1968.0*      Lab 08/20/11 0919  TROPONINI <0.30    Lab 08/19/11 1125 08/16/11 0805 08/15/11 0527 08/15/11 0130 08/15/11 0015  PHART 7.463* 7.529* 7.434* 7.196* 7.148*  PCO2ART 49.8* 47.2* 56.6* 111.2* 129.6*  PO2ART 102.0* 99.0 142.0* 73.0* 141.0*  HCO3 35.2* 39.3* 37.9* 43.1* 45.0*  TCO2 36.7 41 40 46 49  O2SAT 98.7 98.0 99.0 88.0 98.0     ASSESSMENT AND PLAN  PULMONARY ETT 5/24>>5/26  Acute on  chronic hypoxic, hypercapnic respiratory failure secondary to AECOPD in setting of narcotic/benzo use.   -on 5/29 and 5/20 clinically and on abg appears baseline  Plan: - Continued duoneb - continue prednisone, wean over 2 weeks as pulm status allows  -  CARDIOVASCULAR  Lab 08/20/11 0540 08/14/11 2345  PROBNP 1485.0* 1968.0*  Echo 5/25>>EF 65 to 70%, mild MR, lipomatous hypertrophy of atrial septum Doppler legs 5/25>>no DVT  Hx of A fib>>rate controlled.  No coumadin due to recent upper GI bleed  On 5/30 - 2 episodes of sinus pauses after haldol started 5/29. QTc and troponin normal  Plan: - Monitor heart rate on tele - DECREASE HALDOL DOSE 5/30 - [facility] CARDS CONSULT CALLED 5/30 - May need input from GI if/when coumadin can be resumed, or whether ASA would be better alternative. - low dose lasix with kcl; but change to po 5/30 and check bnp 5/31 and decide on dc v continue (will need cards inut on this) - Continue bisoprolol (avoid coreg) -Change ACE inhibitor to ARB in view of copd  Hx of HTN  Plan: -Monitor BP.   RENAL Hx of renal mass>>seen on CT abd/pelvis 07/21/11  Plan: -Will need further assessment when more stable>>can be done as outpt   HEMATOLOGIC  Lab 08/20/11 0540 08/19/11 0546 08/18/11 0409  HGB 10.9* 11.5* 11.0*  HCT 33.9* 36.8 34.8*  WBC 10.6* 13.8* 9.4  PLT 229 252 237   Anemia with recent hx of GI bleed.  No evidence for bleeding at this time.  Plan: -F/u CBC -Transfuse for Hb < 7   GASTROINTESTINAL Gastric ulcer with upper GI bleed in April 2013. Plan: -BID protonix  - po foods  - at some point clarify with GI about restarting coumadin   INFECTIOUS Cultures: Blood 5/25>>NTD Urine 5/25>>GNR - PSEUDOMONAS Sputum 5/25>>Reincubated on 5/27 No results found for this basename: PROCALCITON:5 in the last 168 hours   Antibiotics: Vancomycin 05/25>>5/27 Zosyn 5/25>>5/28 Doxycycline 5/28>>5/29 Cefepime 5/29>>>  Possible sources of  infection are HCAP or UTI. Plan: -change to doxycycline 5/28 -> change to pseudomonal UTI coverage 5/29 -abx as above -check pct 5/31 A, consider narrow or d/c abx  NEUROLOGIC Chronic pain, anxiety Delirium - started haldol 5/29 (dtr requesting anti depressant on 5/29 but has agred to hold off till delirium resolved)  On 5/30 delirium much improved to near resolved. Pain improved with norco and tramadol  Plan: - Continue haldol but reduce dose (due to sinus pauses)  - aVoid benzo - limit pain medication to norco and ultram  ENDOCRINE Hx of hypothyroidism Plan: -synthroid 75 mcg daily  GLOBAL Deconditioned. Daughter does not want  patient to go to SNF rehab ("over medicating, drug patient and make patient shut up") and are not holisitic enough. She wants Inpatient rehab but they on 5/29 have said she is not a candidate. CareMgmt lmtcb to dtr about dc to home with home pt/ot/rn  BEST PRACTICE / DISPOSITION - Level of Care:  Tele - Primary Service: Dr Olin Hauser primary but on 5/29 dtr prefers PCCM stay on board as primary) - Code Status:  Full code - Diet:  Heart healthy - DVT Px:  SCDs - GI Px:  Protonix - Consults: none - Family: none available 5/30 - Dispo:  Daughter does not want patient to go to SNF rehab ("over medicating, drug patient and make patient shut up") and are not holisitic enough. She wants Inpatient rehab but they on 5/29 have said she is not a candidate. CareMgmt lmtcb to dtr about dc to home with home pt/ot/rn   Dr. Kalman Shan, M.D., [facility].C.P Pulmonary and Critical Care Medicine Staff Physician [facility] Pager: [phone], If no answer or between  15:00h - 7:00h: call [phone]  08/20/2011 4:26 PM

## 2011-08-20 NOTE — Progress Notes (Signed)
Pt had 2.47 seconds pause.  Asymptomatic.  BP 150/90, P88, 02 sat 95% 1L [place]. Temp 97.8.  Dr. Marchelle Gearing informed.  MD will f/u.  Will continue to monitor.  Trinidy Masterson,RN

## 2011-08-20 NOTE — Progress Notes (Signed)
Rehab admissions - Evaluated for possible admission.  Please see rehab consult done by Dr. Riley Kill recommending SNF.  Agree patient best suited for lower intensity therapies at SNF level.  #[phone]

## 2011-08-20 NOTE — Progress Notes (Signed)
Pr remain asymptomatic.  Message left with Dot Lanes Dr, Marchelle Gearing nurse's.  Continue to monitor.  Amanda Pea, Charity fundraiser.

## 2011-08-20 NOTE — Progress Notes (Signed)
Physical Therapy Treatment Patient Details Name: Natalie [name] MRN: [id number] DOB: 09-17-1930 Today's Date: 08/20/2011 Time: 0950-1020 PT Time Calculation (min): 30 min  PT Assessment / Plan / Recommendation Comments on Treatment Session  Pt had bowel movement prior to start of session. Pt able to participate after being cleaned up.  Pt requires max encouragement to participate in therapy.      Follow Up Recommendations  Skilled nursing facility    Barriers to Discharge        Equipment Recommendations  Defer to next venue    Recommendations for Other Services    Frequency Min 2X/week   Plan Discharge plan remains appropriate;Frequency remains appropriate    Precautions / Restrictions Precautions Precautions: Fall Restrictions Weight Bearing Restrictions: No   Pertinent Vitals/Pain Pt c/o abdominal pain (been hurting for years). RN notified.     Mobility  Bed Mobility Bed Mobility: Supine to Sit Supine to Sit: 4: Min assist;HOB elevated;With rails Sitting - Scoot to Edge of Bed: 4: Min assist Sit to Supine: Not Tested (comment) Details for Bed Mobility Assistance: Min assist via pt pulling on my hand to raise trunk from bed HOB elvevated.  Transfers Transfers: Sit to Stand;Stand to Sit;Stand Pivot Transfers Sit to Stand: 4: Min assist;With upper extremity assist;From bed;From chair/3-in-1;With armrests (3 trials. ) Stand to Sit: 4: Min assist;With upper extremity assist;To chair/3-in-1 Stand Pivot Transfers: 4: Min assist Squat Pivot Transfers: Not tested (comment) Details for Transfer Assistance: Assist for balance and to translate trunk anterior over BOS.  Cues for sequence. Ambulation/Gait Ambulation/Gait Assistance: 4: Min assist Ambulation Distance (Feet): 10 Feet Assistive device: Rolling walker Ambulation/Gait Assistance Details: Assist to steady pt and to steer RW.   Gait Pattern: Step-through pattern;Trunk flexed Gait velocity: excessively slow.     Stairs: No Wheelchair Mobility Wheelchair Mobility: No    Exercises     PT Diagnosis:    PT Problem List:   PT Treatment Interventions:     PT Goals Acute Rehab PT Goals PT Goal Formulation: With patient Time For Goal Achievement: 09/01/11 Potential to Achieve Goals: Good Pt will go Supine/Side to Sit: with supervision PT Goal: Supine/Side to Sit - Progress: Progressing toward goal Pt will go Sit to Supine/Side: with supervision Pt will go Sit to Stand: with supervision PT Goal: Sit to Stand - Progress: Progressing toward goal Pt will go Stand to Sit: with supervision PT Goal: Stand to Sit - Progress: Progressing toward goal Pt will Transfer Bed to Chair/Chair to Bed: with supervision PT Transfer Goal: Bed to Chair/Chair to Bed - Progress: Progressing toward goal Pt will Ambulate: 51 - 150 feet;with supervision;with least restrictive assistive device PT Goal: Ambulate - Progress: Progressing toward goal Pt will Go Up / Down Stairs: 3-5 stairs;with min assist;with rail(s) PT Goal: Up/Down Stairs - Progress: Discontinued (comment) Pt will Perform Home Exercise Program: Independently  Visit Information  Last PT Received On: 08/20/11    Subjective Data  Subjective: Pt keeps repeating that she is dieing.  Patient Stated Goal: get me the hell out of here.    Cognition  Overall Cognitive Status: Appears within functional limits for tasks assessed/performed Arousal/Alertness: Awake/alert Orientation Level: Appears intact for tasks assessed Behavior During Session: Flat affect Cognition - Other Comments: Pt mentioned death and dieing several times during session.     Balance  Balance Balance Assessed: No  End of Session PT - End of Session Equipment Utilized During Treatment: Gait belt Activity Tolerance: Patient limited by fatigue;Patient limited  by pain;Other (comment) (Pt disinterested in any physical activtiy. ) Patient left: in chair;with call bell/phone within  reach Nurse Communication: Mobility status    Jessee Mezera 08/20/2011, 1:38 PM Mychele Seyller L. Ludean Duhart DPT [phone]

## 2011-08-20 NOTE — Care Management Note (Addendum)
    Page 1 of 1   08/25/2011     3:04:37 PM   CARE MANAGEMENT NOTE 08/25/2011  Patient:  Natalie [name], Natalie [name]   Account Number:  [id number]  Date Initiated:  08/19/2011  Documentation initiated by:  Fransico Michael  Subjective/Objective Assessment:   admitted on 08/14/11 with c/o lethargy and shortness of breath. Intubated in ED     Action/Plan:   mechanical ventilation  sodium replacement  sedation   Anticipated DC Date:  08/23/2011   Anticipated DC Plan:  SKILLED NURSING FACILITY      DC Planning Services  CM consult      [facility] Choice  HOME HEALTH  DURABLE MEDICAL EQUIPMENT   Choice offered to / List presented to:  C-4 Adult Children   DME arranged  3-N-1  Levan Hurst      DME agency  [facility]     [facility] arranged  HH-1 RN  HH-2 PT  HH-4 NURSE'S AIDE  HH-6 SOCIAL WORKER      HH agency  [facility]   Status of service:  Completed, signed off Medicare Important Message given?   (If response is "NO", the following Medicare IM given date fields will be blank) Date Medicare IM given:   Date Additional Medicare IM given:    Discharge Disposition:    Per UR Regulation:  Reviewed for med. necessity/level of care/duration of stay  If discussed at Long Length of Stay Meetings, dates discussed:    Comments:  08/25/11-1446-J.Britta Louth,RN,BSN [phone]      Spoke with daughter K.Parker regarding home health needs. Choice of agencies had been given yesterday. [facility]. Marie,RN with [facility] notified of need for home health PT,RN,Aide. Derrian notified of need for 3-n-1 and rolling walker. Anticipated discharge home tomorrow with home health. CM will continue to follow.  08/19/11-1048-J.Lutricia Horsfall [phone]      78yo female patient, resident at [facility], admitted on 08/14/11 with c/o lethargy and shortness of breath. Patient was intubated shortly after arrival in ED. Received word from RN that daughter would like to speak to case  manager regarding disposition. Called daughter, Antony Odea at [phone]. Had a lengthy discussion regarding daughter's concerns of overmedication of her mother at SNF and desire for patient to be evaluated by CIR. Daughter's wish is for patient to eventually be discharged to home with assistance. Wanted to know if CM had any information regarding holistic or wellness centers that would be available to discharge too. NCM will discuss concerns with CSW.

## 2011-08-20 NOTE — Progress Notes (Signed)
Pt's BP139/94, P82  With a 2.49second pause.  Asymptomatic. Message left for Dr. Marchelle Gearing at his office.  Amanda Pea, Charity fundraiser.

## 2011-08-20 NOTE — Progress Notes (Signed)
Pt's had a 2.49 pause, & asymptomatic.  BP 139/94' P82.  Denies any pain or discomfort.  Dr. Sandria Manly sent a tx paged  Of current pause.   Will cont. To monitor.  Natalie [name], Charity fundraiser.

## 2011-08-20 NOTE — Consult Note (Signed)
Reason for Consult: bradycardia, chronic a. Fib    Referring Physician: CCM   Primary Cardiologist: Dr. Murriel Hopper is an 78 y.o. female.    Chief Complaint:  On admit --pneumonia and SOB, freq pain meds and Benzos  HPI: 78 yo female, former smoker, lived at home until April 2013 when she was admitted for GI bleed. Then to Rehab and then admitted on 08/14/2011 with acute metabolic encephalopathy from acute on chronic hypercapnic respiratory failure with VDRF. [name], [name] She was tx for PNA in rehab. PMHx HTN, Hypothyroidism, PUD, A fib, COPD, Anxiety, Arthritis, Lt renal mass, Trigeminal neuralgia.  She improved and was extubated 08/15/11.   Now with her chronic a. Fib and has had at least 2 episodes of pauses of up to 2.2 sec.  She has been on Haldol for delirium, dose was decreased today.  She is followed by Dr. Royann Shivers for permanent atrial fib, no longer on coumadin secondary to GI Bleed, gastric ulcers.  She also has HTN, and chronic pain related to lumbar spine stenosis.  Stress tests in the past have been WNL.   Past Medical History  Diagnosis Date  . Osteoporosis   . Hypertension   . Hypothyroidism   . Lower GI bleeding 07/21/11  . Atrial fibrillation   . COPD (chronic obstructive pulmonary disease)   . Pneumonia   . Shortness of breath 07/21/11    "anytime"  . Sinus headache 07/21/11    "all the time"  . Bladder infection, chronic   . Arthritis     "everywhere"  . Anxiety   . Chronic lower back pain   . Chronic abdominal pain   . Renal mass, left 2013  . PUD (peptic ulcer disease)   . Chronic cystitis   . Trigeminal neuralgia     /E-chart  . Chronic kidney disease     Past Surgical History  Procedure Date  . Vaginal hysterectomy     partial  . Cataract extraction w/ intraocular lens  implant,  bilateral   . Percutaneous pinning femoral neck fracture 11/2003    w/closed reduction ; left/E-chart  . Laceration repair 11/2003    left eyebrow  . Bilateral salpingoophorectomy 12/2005    lap/E-chart  . Cholecystectomy 1960  . Appendectomy     presumed/E-chart  . Esophagogastroduodenoscopy 07/23/2011    Procedure: ESOPHAGOGASTRODUODENOSCOPY (EGD);  Surgeon: Graylin Shiver, MD;  Location: [facility] ENDOSCOPY;  Service: Endoscopy;  Laterality: N/A;    History reviewed. No pertinent family history. Social History:  reports that she has quit smoking. Her smoking use included Cigarettes. She has never used smokeless tobacco. She reports that she drinks alcohol. She reports that she does not use illicit drugs.She had been in rehab prior to admit.  Allergies:  Allergies  Allergen Reactions  . Levofloxacin Other (See Comments)    REACTION: unspecified per [facility]    Medications Prior to Admission  Medication Sig Dispense Refill  . amLODipine (NORVASC) 2.5 MG tablet Take 2.5 mg by mouth daily.      . benazepril (LOTENSIN) 40 MG tablet Take 40 mg by mouth daily.        Marland Kitchen bismuth subsalicylate (KAOPECTATE) 262 MG/15ML suspension Take 30 mLs by mouth every 6 (six) hours as needed. For diarrhea      . calcium citrate (CALCITRATE -  DOSED IN MG ELEMENTAL CALCIUM) 950 MG tablet Take 1 tablet by mouth 2 (two) times daily.        . carvedilol (COREG) 12.5 MG tablet Take 12.5 mg by mouth 2 (two) times daily with a meal.        . dicyclomine (BENTYL) 10 MG capsule Take 10 mg by mouth 4 (four) times daily -  before meals and at bedtime.        Marland Kitchen escitalopram (LEXAPRO) 10 MG tablet Take 10 mg by mouth every evening.      Marland Kitchen HYDROcodone-acetaminophen (VICODIN) 5-500 MG per tablet Take 1 tablet by mouth daily as needed. For pain      . lansoprazole (PREVACID) 30 MG capsule Take 30 mg by mouth 2 (two) times daily before a meal.      . levothyroxine (SYNTHROID, LEVOTHROID) 75 MCG tablet Take 75 mcg by mouth daily.        Marland Kitchen LORazepam (ATIVAN) 0.5 MG tablet Take 0.5 mg by mouth 2 (two) times daily.      . montelukast (SINGULAIR) 10 MG tablet Take 10 mg by mouth daily.        Results for orders placed during the hospital encounter of 08/14/11 (from the past 48 hour(s))  BASIC METABOLIC PANEL     Status: Abnormal   Collection Time   08/19/11  5:46 AM      Component Value Range Comment   Sodium 138  135 - 145 (mEq/L)    Potassium 3.8  3.5 - 5.1 (mEq/L)    Chloride 94 (*) 96 - 112 (mEq/L)    CO2 39 (*) 19 - 32 (mEq/L)    Glucose, Bld 89  70 - 99 (mg/dL)    BUN 21  6 - 23 (mg/dL)    Creatinine, Ser 1.61  0.50 - 1.10 (mg/dL)    Calcium 9.1  8.4 - 10.5 (mg/dL)    GFR calc non Af Amer >90  >90 (mL/min)    GFR calc Af Amer >90  >90 (mL/min)   CBC     Status: Abnormal   Collection Time   08/19/11  5:46 AM      Component Value Range Comment   WBC 13.8 (*) 4.0 - 10.5 (K/uL)    RBC 4.08  3.87 - 5.11 (MIL/uL)    Hemoglobin 11.5 (*) 12.0 - 15.0 (g/dL)    HCT 09.6  04.5 - 40.9 (%)    MCV 90.2  78.0 - 100.0 (fL)    MCH 28.2  26.0 - 34.0 (pg)    MCHC 31.3  30.0 - 36.0 (g/dL)    RDW 81.1  91.4 - 78.2 (%)    Platelets 252  150 - 400 (K/uL)   BLOOD GAS, ARTERIAL     Status: Abnormal   Collection Time   08/19/11 11:25 AM      Component Value Range Comment   O2 Content 2.0      Delivery systems NASAL CANNULA      pH, Arterial 7.463 (*) 7.350 - 7.400     pCO2 arterial 49.8 (*) 35.0 - 45.0 (mmHg)    pO2, Arterial 102.0 (*) 80.0 - 100.0 (mmHg)    Bicarbonate 35.2 (*) 20.0 - 24.0 (mEq/L)    TCO2 36.7  0 - 100 (mmol/L)    Acid-Base Excess 10.8 (*) 0.0 - 2.0 (mmol/L)    O2 Saturation 98.7      Patient temperature 98.6      Collection site RIGHT RADIAL  Drawn by [id number]      Sample type ARTERIAL DRAW      Allens test (pass/fail) PASS  PASS    BASIC METABOLIC PANEL     Status: Abnormal   Collection Time   08/19/11 11:26 PM      Component Value Range Comment   Sodium 134 (*) 135 - 145 (mEq/L)    Potassium 4.1   3.5 - 5.1 (mEq/L)    Chloride 90 (*) 96 - 112 (mEq/L)    CO2 39 (*) 19 - 32 (mEq/L)    Glucose, Bld 97  70 - 99 (mg/dL)    BUN 19  6 - 23 (mg/dL)    Creatinine, Ser 1.47 (*) 0.50 - 1.10 (mg/dL)    Calcium 8.8  8.4 - 10.5 (mg/dL)    GFR calc non Af Amer >90  >90 (mL/min)    GFR calc Af Amer >90  >90 (mL/min)   CBC     Status: Abnormal   Collection Time   08/20/11  5:40 AM      Component Value Range Comment   WBC 10.6 (*) 4.0 - 10.5 (K/uL)    RBC 3.82 (*) 3.87 - 5.11 (MIL/uL)    Hemoglobin 10.9 (*) 12.0 - 15.0 (g/dL)    HCT 82.9 (*) 56.2 - 46.0 (%)    MCV 88.7  78.0 - 100.0 (fL)    MCH 28.5  26.0 - 34.0 (pg)    MCHC 32.2  30.0 - 36.0 (g/dL)    RDW 13.0  86.5 - 78.4 (%)    Platelets 229  150 - 400 (K/uL)   MAGNESIUM     Status: Normal   Collection Time   08/20/11  5:40 AM      Component Value Range Comment   Magnesium 1.8  1.5 - 2.5 (mg/dL)   PHOSPHORUS     Status: Normal   Collection Time   08/20/11  5:40 AM      Component Value Range Comment   Phosphorus 2.6  2.3 - 4.6 (mg/dL)   PRO B NATRIURETIC PEPTIDE     Status: Abnormal   Collection Time   08/20/11  5:40 AM      Component Value Range Comment   Pro B Natriuretic peptide (BNP) 1485.0 (*) 0 - 450 (pg/mL)   BASIC METABOLIC PANEL     Status: Abnormal   Collection Time   08/20/11  9:19 AM      Component Value Range Comment   Sodium 135  135 - 145 (mEq/L)    Potassium 3.6  3.5 - 5.1 (mEq/L)    Chloride 92 (*) 96 - 112 (mEq/L)    CO2 34 (*) 19 - 32 (mEq/L)    Glucose, Bld 181 (*) 70 - 99 (mg/dL)    BUN 16  6 - 23 (mg/dL)    Creatinine, Ser 6.96 (*) 0.50 - 1.10 (mg/dL)    Calcium 9.0  8.4 - 10.5 (mg/dL)    GFR calc non Af Amer >90  >90 (mL/min)    GFR calc Af Amer >90  >90 (mL/min)   PHOSPHORUS     Status: Normal   Collection Time   08/20/11  9:19 AM      Component Value Range Comment   Phosphorus 2.4  2.3 - 4.6 (mg/dL)   TROPONIN I     Status: Normal   Collection Time   08/20/11  9:19 AM      Component Value Range  Comment   Troponin I <0.30  <  0.30 (ng/mL)    Dg Abd Portable 1v  08/18/2011  *RADIOLOGY REPORT*  Clinical Data: 78 year old female with abdominal pain and diarrhea.  PORTABLE ABDOMEN - 1 VIEW  Comparison: CT abdomen pelvis 07/21/2011 and earlier.  Findings: Portable AP view at 1839 hours. Nonobstructed bowel gas pattern.  Stable postoperative changes to the visualized proximal left femur. Stable visualized osseous structures.  Probable small bilateral pleural effusions.  Vascular calcifications.  No definite pneumoperitoneum on this supine view.  Stable right nephrocalcinosis.  IMPRESSION: Nonobstructed bowel gas pattern.  Probable small pleural effusions.  Original Report Authenticated By: Harley Hallmark, M.D.    ROS: General:multiple issues recently, currently improving Skin:no rashes HEENT:no blurred vision CV:no chest pain PUL:+SOB GI:no melena now GU:no hematuria MS:hx. Of arthritis Neuro:has had lightheadedness  No syncope Endo:+ Hypothroid   Blood pressure 126/89, pulse 99, temperature 98.1 F (36.7 C), temperature source Oral, resp. rate 20, height 5\' 2"  (1.575 m), weight 63.2 kg (139 lb 5.3 oz), SpO2 98.00%. PE: General:alert, oriented to person, hospital and year, thought it was June though Skin:warm and dry, brisk capillary refill HEENT:normocephalic, sclera clear  Neck:supple no JVD Heart:S1S2 irreg irreg Lungs:diminshed breath sounds Abd:+ BS, soft, non tender Ext:tr. Edema, weak pedal pulses Neuro:Oriented X 2, MAE, follows commands    Assessment/Plan Patient Active Problem List  Diagnoses  . HYPOTHYROIDISM  . ANXIETY DISORDER  . DEPRESSION  . HYPERTENSION  . ALLERGIC RHINITIS  . CHRONIC HEADACHES  . Acute GI bleeding  . Abdominal pain  . Renal mass, left  . Chronic back pain  . Recurrent UTI  . Atrial fibrillation  . Warfarin anticoagulation  . Acute blood loss anemia  . Acute gastric ulcer with bleeding   PLAN: No longer on coreg on Zebeta at 5 mg. ?  Decrease to 2.5?   Otherwise heart rate 80-90's.  BP labile with pressure last pm 162/101 to 126/89.  Dr. Royann Shivers to see.  With recent gastric ulcers, ? When to resume asprin and? Coumadin or is risk outweigh benefit?  INGOLD,LAURA R 08/20/2011, 3:48 PM    I have seen and examined the patient along with [facility] R, NP.  I have reviewed the chart, notes and new data.  I agree with NP's note.  Key new complaints: mostly oriented Key examination changes: irregular rhythm Key new findings / data: borderline high WBC, stable Hgb, elevated venous CO2 compensatory metabolic alkalosis in patient with chronic hypoventilation/COPD.  PLAN: The 2.2 second pauses are not of great concern in a patient with AF and are not symptomatic. Ventricular rate control is optimal (74-120, rarely over 100). Changing to bisoprolol may indeed be beneficial from a reactive airway standpoint. Her BP has always been fairly volatile. Recommend restarting her outpatient dose of amlodipine. From an arrhythmia standpoint an unresolved issue would be date of anticoagulation resumption (usually 4-6 weeks after initiation of treatment for gastric ulcer should suffice). Will seek guidance from GI service.  Thurmon Fair, MD, [facility] [phone] 08/20/2011, 4:52 PM

## 2011-08-21 DIAGNOSIS — IMO0001 Reserved for inherently not codable concepts without codable children: Secondary | ICD-10-CM | POA: Diagnosis present

## 2011-08-21 DIAGNOSIS — F411 Generalized anxiety disorder: Secondary | ICD-10-CM

## 2011-08-21 DIAGNOSIS — G9340 Encephalopathy, unspecified: Secondary | ICD-10-CM | POA: Diagnosis not present

## 2011-08-21 DIAGNOSIS — J9690 Respiratory failure, unspecified, unspecified whether with hypoxia or hypercapnia: Secondary | ICD-10-CM

## 2011-08-21 HISTORY — DX: Reserved for inherently not codable concepts without codable children: IMO0001

## 2011-08-21 HISTORY — DX: Respiratory failure, unspecified, unspecified whether with hypoxia or hypercapnia: J96.90

## 2011-08-21 LAB — COMPREHENSIVE METABOLIC PANEL
ALT: 38 U/L — ABNORMAL HIGH (ref 0–35)
Alkaline Phosphatase: 38 U/L — ABNORMAL LOW (ref 39–117)
CO2: 34 mEq/L — ABNORMAL HIGH (ref 19–32)
GFR calc Af Amer: >90 mL/min (ref >90)
Glucose, Bld: 95 mg/dL (ref 70–99)
Potassium: 3.9 mEq/L (ref 3.5–5.1)
Sodium: 132 mEq/L — ABNORMAL LOW (ref 135–145)
Total Protein: 5.7 g/dL — ABNORMAL LOW (ref 6.0–8.3)

## 2011-08-21 LAB — CBC
Hemoglobin: 11.9 g/dL — ABNORMAL LOW (ref 12.0–15.0)
MCHC: 32.1 g/dL (ref 30.0–36.0)
RBC: 4.23 MIL/uL (ref 3.87–5.11)

## 2011-08-21 LAB — CULTURE, BLOOD (ROUTINE X 2)

## 2011-08-21 LAB — MAGNESIUM: Magnesium: 2.1 mg/dL (ref 1.5–2.5)

## 2011-08-21 LAB — CARDIAC PANEL(CRET KIN+CKTOT+MB+TROPI): Relative Index: INVALID (ref 0.0–2.5)

## 2011-08-21 LAB — PROCALCITONIN: Procalcitonin: <0.1 ng/mL

## 2011-08-21 MED ORDER — HALOPERIDOL LACTATE 5 MG/ML IJ SOLN
3.0000 mg | Freq: Four times a day (QID) | INTRAMUSCULAR | Status: DC
  Administered 2011-08-21 – 2011-08-24 (×10): 3 mg via INTRAVENOUS
  Filled 2011-08-21 (×16): qty 0.6

## 2011-08-21 MED ORDER — DULOXETINE HCL 20 MG PO CPEP
20.0000 mg | ORAL_CAPSULE | Freq: Every day | ORAL | Status: DC
  Administered 2011-08-22 – 2011-08-25 (×4): 20 mg via ORAL
  Filled 2011-08-21 (×4): qty 1

## 2011-08-21 NOTE — Progress Notes (Addendum)
CSW continuing to follow. Patient's dtr has expressed that she is not interested in SNF.Patient does. However, have bed offers.  CSW will continue to help facilitate with discharge planning as needed  Sabino Niemann, MSW, [place] [phone]

## 2011-08-21 NOTE — Progress Notes (Addendum)
Name: Natalie [name] MRN: [id number] DOB: 08-Jun-1930    LOS: 7 Date of admit 08/14/2011 11:10 PM   PULMONARY / CRITICAL CARE MEDICINE  HPI:  78 yo female former smoker lived at home until April 2013 (pre April 2013 condition appears to be general failure to thrive and slow decline in functional statu sfor 6-12 months and on baseline vicodin and ativan for chronic pain nos and chronic anxiety nos). when she was admitted for GI bleed. Then to Rehab and then admitted on 08/14/2011 with acute metabolic encephalopathy from acute on chronic hypercapnic respiratory failure with VDRF.  [name], narcotic use.  She was in hospital until April 30 for GI bleed from gastric ulcer and in rehab due to deconditioning.  She was tx for PNA in rehab. PMHx HTN, Hypothyroidism, PUD, A fib, COPD, Anxiety, Arthritis, Lt renal mass, Trigeminal neuralgia    SUBJECTIVE/OVERNIGHT/INTERVAL HX  Required haldol during the night for agitation. Refuses to speak or cooperate.   PM staff rounds: telling family/son to go away and says pain all over - similar delirius behavior to 5/29  Vital Signs: BP 125/75  Pulse 76  Temp(Src) 97.5 F (36.4 C) (Oral)  Resp 20  Ht 5\' 2"  (1.575 m)  Wt 139 lb 5.3 oz (63.2 kg)  BMI 25.48 kg/m2  SpO2 98%   Intake/Output Summary (Last 24 hours) at 08/21/11 1002 Last data filed at 08/20/11 2104  Gross per 24 hour  Intake    572 ml  Output    950 ml  Net   -378 ml   Physical Exam: General - no distress, cachectic. Refuses to get up. HEENT - mm pink/moist Cardiac - irregular, no murmur Chest - prolonged exhalation, no wheeze/rales Abd - soft, non-tender Ext - no edema Neuro - Awake, no distress,refuses to follow commands. RASS +1. Confused, Asking family to leave   CBC  Lab 08/21/11 0555 08/20/11 0540 08/19/11 0546  HGB 11.9* 10.9* 11.5*  HCT 37.1 33.9* 36.8  WBC 11.6* 10.6* 13.8*  PLT 262 229 252    BMET  Lab 08/21/11 0555  08/20/11 0919 08/20/11 0540 08/19/11 2326 08/19/11 0546 08/18/11 0409 08/16/11 0930 08/15/11 0645  NA 132* 135 -- 134* 138 139 -- --  K 3.9 3.6 -- -- -- -- -- --  CL 92* 92* -- 90* 94* 95* -- --  CO2 34* 34* -- 39* 39* 39* -- --  GLUCOSE 95 181* -- 97 89 123* -- --  BUN 10 16 -- 19 21 23  -- --  CREATININE 0.35* 0.45* -- 0.48* 0.50 0.59 -- --  CALCIUM 8.8 9.0 -- 8.8 9.1 8.4 -- --  MG 2.1 -- 1.8 -- -- 2.1 2.3 2.0  PHOS -- 2.4 2.6 -- -- 4.5 -- 2.9     Lab Results  Component Value Date   ALT 38* 08/21/2011   AST 21 08/21/2011   ALKPHOS 38* 08/21/2011   BILITOT 0.5 08/21/2011   ABG  Lab 08/19/11 1125 08/16/11 0805 08/15/11 0527 08/15/11 0130 08/15/11 0015  PHART 7.463* 7.529* 7.434* 7.196* 7.148*  PCO2ART 49.8* 47.2* 56.6* 111.2* 129.6*  PO2ART 102.0* 99.0 142.0* 73.0* 141.0*  HCO3 35.2* 39.3* 37.9* 43.1* 45.0*  TCO2 36.7 41 40 46 49  O2SAT 98.7 98.0 99.0 88.0 98.0    No results found.  Lab 08/21/11 0555 08/20/11 0540 08/14/11 2345  PROBNP 1035.0* 1485.0* 1968.0*      Lab 08/21/11 0555 08/20/11 0919  TROPONINI <0.30 <0.30  Lab 08/19/11 1125 08/16/11 0805 08/15/11 0527 08/15/11 0130 08/15/11 0015  PHART 7.463* 7.529* 7.434* 7.196* 7.148*  PCO2ART 49.8* 47.2* 56.6* 111.2* 129.6*  PO2ART 102.0* 99.0 142.0* 73.0* 141.0*  HCO3 35.2* 39.3* 37.9* 43.1* 45.0*  TCO2 36.7 41 40 46 49  O2SAT 98.7 98.0 99.0 88.0 98.0     ASSESSMENT AND PLAN  PULMONARY ETT 5/24>>5/26  Acute on chronic hypoxic, hypercapnic respiratory failure secondary to AECOPD in setting of narcotic/benzo use.   -on 5/29 and 5/20 clinically and on abg appears baseline  Plan: - Continued duoneb - continue prednisone, wean over 2 weeks as pulm status allows  -  CARDIOVASCULAR  Lab 08/21/11 0555 08/20/11 0540 08/14/11 2345  PROBNP 1035.0* 1485.0* 1968.0*  Echo 5/25>>EF 65 to 70%, mild MR, lipomatous hypertrophy of atrial septum Doppler legs 5/25>>no DVT  Hx of A fib>>rate controlled.  No coumadin  due to recent upper GI bleed Hx of HTN Acute Diastolic CHF  On 5/28 - 2 episodes of sinus pauses after haldol started 5/29. QTc and troponin normal. Cards consult - considered it asymptomatic  Plan: - Monitor heart rate on tele - [facility] Cards will consider pauses significant when > 3 sec - - May need input from GI if/when coumadin can be resumed, or whether ASA would be better alternative; as of 5/31 fall risk and so no coumadin - low dose lasix with kcl - Continue bisoprolol (coreg dc'ed)  - ARB (ACE nhibitor dc'ed 5/30)   RENAL Hx of renal mass>>seen on CT abd/pelvis 07/21/11  Plan: -Will need further assessment when more stable>>can be done as outpt   HEMATOLOGIC  Lab 08/21/11 0555 08/20/11 0540 08/19/11 0546  HGB 11.9* 10.9* 11.5*  HCT 37.1 33.9* 36.8  WBC 11.6* 10.6* 13.8*  PLT 262 229 252   Anemia with recent hx of GI bleed.  No evidence for bleeding at this time.  Plan: -F/u CBC -Transfuse for Hb < 7   GASTROINTESTINAL Gastric ulcer with upper GI bleed in April 2013. Plan: -BID protonix  - po foods  - at some point clarify with GI about restarting coumadin   INFECTIOUS Cultures: Blood 5/25>>neg Urine 5/25>>GNR - PSEUDOMONAS Sputum 5/25>>Reincubated on 5/27 >NEG  Lab 08/21/11 0555  PROCALCITON <0.10     Antibiotics: Vancomycin 05/25>>5/27 Zosyn 5/25>>5/28 Doxycycline 5/28>>5/29 Cefepime 5/29>>> (6/2)    Possible sources of infection are HCAP or UTI. Plan: -change to doxycycline 5/28 -> change to pseudomonal UTI coverage 5/29 -abx as above -check pct 5/31 A, consider narrow or d/c abx  NEUROLOGIC Chronic pain, anxiety - baseline vicodin use and ativan use for 8 years Acut post ICU Delirium -   On 5/29 - bad delirum - started haldol On 5/30 delirium much improved to near resolved. Pain improved with norco and tramadol -> haldol changed to seroquel On 5/31 -  Delirium again. FAmily convinced  Patient depressed  Plan: - REstart haldol  5/31 at 3mg  Q6h (sinus pauses considered asymptomatic pauses per cards and qtc normal 5/30) - Continue seroquel since 5/20Continue haldol but reduce dose (due to sinus pauses) - start anti-depressant duloxetine (Rx pain, anziety and depresion) - family insistent  - aVoid benzo (though on it chronically, family wants to limit this in acute delirium setting) - limit pain medication to norco and ultram  ENDOCRINE Hx of hypothyroidism Plan: -synthroid 75 mcg daily  GLOBAL Deconditioned. Daughter does not want patient to go to SNF rehab ("over medicating, drug patient and make patient shut up") and are not  holisitic enough. She wants Inpatient rehab but they on 5/29 have said she is not a candidate. CareMgmt lmtcb to dtr about dc to home with home pt/ot/rn 5/31 pt refuses to cooperate. Unable to send home till plan of care clarified.  On 5/31: Dr Marchelle Gearing 30 minute discussion with out of state son, 2 daughters [name] (main person) and [employer] - general decline described x 6-12 months with loss of interest in TV etc. But no frank depressive symptoms (Dtrs convincned this was depression and want anti depressant now). In [place] in April and May 2013 getting rehab. Mental status described as intact but with baseline chronic pain and anxiety and patient demanding repeated benzo and one of the daughters having to give to her (though they describe their experience at [facility] as negative and them over medicating patient). Currently appears to have acute deliirum that is markedly worse post ICU. Children were explained what this means and prognosis and outcome and fully understand that this is a set back. They have agreed with haldol. They want anti depressant. Goal is to go home with home assist (they do not want SNF rehab and patient declined by inpatient rehab)  BEST PRACTICE / DISPOSITION - Level of Care:  Tele - Primary Service: Dr Olin Hauser primary but on 5/29 dtr prefers PCCM stay on board  as primary - Code Status:  Full code - Diet:  Heart healthy - DVT Px:  SCDs - GI Px:  Protonix - Consults: none - Family:extensive meeting 5/31 - Dispo: home when delirium clears with home assist

## 2011-08-21 NOTE — Progress Notes (Signed)
ANTIBIOTIC CONSULT NOTE - FOLLOW UP  Pharmacy Consult for Cefepime Indication: pseudomonas UTI  Allergies  Allergen Reactions  . Levofloxacin Other (See Comments)    REACTION: unspecified per [facility]    Patient Measurements: Height: 5\' 2"  (157.5 cm) Weight: 139 lb 5.3 oz (63.2 kg) (bed scale) IBW/kg (Calculated) : 50.1   Vital Signs: Temp: 97.9 F (36.6 C) (05/31 1410) Temp src: Oral (05/31 1410) BP: 134/96 mmHg (05/31 1410) Pulse Rate: 89  (05/31 1410) Intake/Output from previous day: 05/30 0701 - 05/31 0700 In: 812 [P.O.:420; I.V.:240; IV Piggyback:152] Out: 1450 [Urine:1450] Intake/Output from this shift: Total I/O In: 120 [P.O.:120] Out: -   Labs:  Basename 08/21/11 0555 08/20/11 0919 08/20/11 0540 08/19/11 2326 08/19/11 0546  WBC 11.6* -- 10.6* -- 13.8*  HGB 11.9* -- 10.9* -- 11.5*  PLT 262 -- 229 -- 252  LABCREA -- -- -- -- --  CREATININE 0.35* 0.45* -- 0.48* --   Estimated Creatinine Clearance: 50.6 ml/min (by C-G formula based on Cr of 0.35).    Microbiology and Antibiotic History:  5/25 BC>>neg 5/25 UC>>80K Pseudomonas >> pan sens (cefepime, ceftaz, cipro, zosyn) 5/25 resp culture>> normal flora 5/25 MRSA PCR: neg  5/25 Vanc >> 5/27 5/25 Zosyn >> 5/28 5/28 PO doxy>>5/29 5/29 Cefepime>>  Assessment: 78 yo F transferred from Rehab with presumed PNA found to have pseudomonas UTI, pan sensitive.  On Cefepime 1 g q12 hrs Day #3.  Estimated CrCl ~ 50.  Afebrile, WBC 11.6  Goal of Therapy:  Renal adjustment of medications  Plan:  Continue Cefepime 1gm IV Q12h. What is length of planned antibiotic therapy?  Reece Leader, Pharm D 08/21/2011 3:36 PM

## 2011-08-21 NOTE — Progress Notes (Signed)
eLink Physician-Brief Progress Note Patient Name: Natalie [name] DOB: [birth year] MRN: [id number]  Date of Service  08/21/2011   HPI/Events of Note   Notified about sinus pause this am, 2.5 sec, asymptomatic. Pt required restart haldol last pm in addition to her seroquel.   eICU Interventions  ECG now to check QTc. D/c haldol for now, may need to consider changing the seroquel.    Intervention Category Major Interventions: Arrhythmia - evaluation and management  Harmonii Karle S. 08/21/2011, 6:46 AM

## 2011-08-21 NOTE — Progress Notes (Signed)
08/20/11 2049 Patient was having increased agitation, irritability, and anxiety last night. Dr.Wert was called and notified and patient received haldol 4mg  IV injection. After patient received medication she began to appear calm and relaxed and was less agitated for the remainder of the shift.

## 2011-08-21 NOTE — Progress Notes (Signed)
08/21/11 4098 Was notified by monitor tech that patient had a pause of 2.52 seconds this am. VS were taken and documented. Patient was resting in bed with no signs or symptoms of distress. MD for critical care was called and notified. Spoke with Dr.Bynum and orders were given for EKG and a change in haldol order was given. EKG was done and placed in patient chart.

## 2011-08-21 NOTE — Progress Notes (Addendum)
Subjective:  Weak, confused last night, required Haldol  Objective:  Vital Signs in the last 24 hours: Temp:  [97.3 F (36.3 C)-98.1 F (36.7 C)] 97.5 F (36.4 C) (05/31 7829) Pulse Rate:  [76-99] 76  (05/31 0633) Resp:  [20] 20  (05/31 0633) BP: (125-150)/(75-89) 125/75 mmHg (05/31 0633) SpO2:  [98 %-100 %] 98 % (05/31 0927)  Intake/Output from previous day:  Intake/Output Summary (Last 24 hours) at 08/21/11 1136 Last data filed at 08/20/11 2104  Gross per 24 hour  Intake    472 ml  Output    950 ml  Net   -478 ml    Physical Exam: General appearance: alert, cooperative, no distress and frail Lungs: decreased breath sounds Heart: irregularly irregular rhythm   Rate: 75  Rhythm: atrial fibrillation  Lab Results:  Basename 08/21/11 0555 08/20/11 0540  WBC 11.6* 10.6*  HGB 11.9* 10.9*  PLT 262 229    Basename 08/21/11 0555 08/20/11 0919  NA 132* 135  K 3.9 3.6  CL 92* 92*  CO2 34* 34*  GLUCOSE 95 181*  BUN 10 16  CREATININE 0.35* 0.45*    Basename 08/21/11 0555 08/20/11 0919  TROPONINI <0.30 <0.30   Hepatic Function Panel  Basename 08/21/11 0555  PROT 5.7*  ALBUMIN 2.8*  AST 21  ALT 38*  ALKPHOS 38*  BILITOT 0.5  BILIDIR --  IBILI --   No results found for this basename: CHOL in the last 72 hours No results found for this basename: INR in the last 72 hours  Imaging: Imaging results have been reviewed  Cardiac Studies:  Assessment/Plan:   Active Problems:  Atrial fibrillation, persistent with asymptomatic pauses  Warfarin anticoagulation, on hold secondary to GI bleed 07/21/11  Acute gastric ulcer with bleeding, 07/21/11  Respiratory failure, extubated 08/15/11  Metabolic encephalopathy  HYPERTENSION  Renal mass, left  Normal left ventricular systolic function by 2D 08/15/11   Plan- Cardiac stable, asymptomatic pauses. Resumption of anticoagulants per her Primary MD. She does not appear to be a candidate unless she is in a supervised  environment. We will be available this weekend if needed. We will see again on Monday.  Corine Shelter PA-C 08/21/2011, 11:36 AM  Pt seen & evaluated this PM - was sleeping soundly.  I agree with the findings, examination & recommendations written by Mr. Diona Fanti.  Stable from a cardiac standpoint.   Review consideration re: resumption of anticoagulation as noted in Dr. Erin Hearing consult note from yesterday.   Marykay Lex, M.D., M.S. [facility] [address] [place], [place]  [address]  [phone] Pager # [phone]  08/21/2011 8:11 PM

## 2011-08-21 NOTE — Progress Notes (Signed)
Call To [facility] for pt. She is requesting allergy medicine and none is currently ordered.

## 2011-08-22 DIAGNOSIS — I4891 Unspecified atrial fibrillation: Secondary | ICD-10-CM

## 2011-08-22 NOTE — Progress Notes (Signed)
Name: Natalie [name] MRN: [id number] DOB: October 29, 1930    LOS: 8 Date of admit 08/14/2011 11:10 PM   PULMONARY / CRITICAL CARE MEDICINE  HPI:  78 yo female former smoker lived at home until April 2013 (pre April 2013 condition appears to be general failure to thrive and slow decline in functional status for 6-12 months and on baseline vicodin and ativan for chronic pain nos and chronic anxiety nos- when she was admitted for GI bleed. Then to Rehab and then admitted on 08/14/2011 with acute metabolic encephalopathy from acute on chronic hypercapnic respiratory failure with VDRF.  [name], narcotic use.  She was in hospital until April 30 for GI bleed from gastric ulcer and in rehab due to deconditioning.  She was tx for PNA in rehab. PMHx HTN, Hypothyroidism, PUD, A fib, COPD, Anxiety, Arthritis, Lt renal mass, Trigeminal neuralgia    SUBJECTIVE/OVERNIGHT/INTERVAL HX Required haldol during the night for agitation. Refuses to speak or cooperate.    Vital Signs: BP 120/77  Pulse 88  Temp(Src) 98.2 F (36.8 C) (Oral)  Resp 20  Ht 5\' 2"  (1.575 m)  Wt 60.6 kg (133 lb 9.6 oz)  BMI 24.44 kg/m2  SpO2 97%   Intake/Output Summary (Last 24 hours) at 08/22/11 0855 Last data filed at 08/22/11 4098  Gross per 24 hour  Intake    530 ml  Output      0 ml  Net    530 ml    Physical Exam: General - no distress, cachectic. Refuses to get up. HEENT - mm pink/moist Cardiac - irregular, no murmur Chest - prolonged exhalation, no wheeze/rales Abd - soft, non-tender Ext - no edema Neuro - Awake, no distress,refuses to follow commands. RASS +1. Confused, Asking family to leave   CBC  Lab 08/21/11 0555 08/20/11 0540 08/19/11 0546  HGB 11.9* 10.9* 11.5*  HCT 37.1 33.9* 36.8  WBC 11.6* 10.6* 13.8*  PLT 262 229 252    BMET  Lab 08/21/11 0555 08/20/11 0919 08/20/11 0540 08/19/11 2326 08/19/11 0546 08/18/11 0409 08/16/11 0930  NA 132* 135 -- 134* 138 139  --  K 3.9 3.6 -- -- -- -- --  CL 92* 92* -- 90* 94* 95* --  CO2 34* 34* -- 39* 39* 39* --  GLUCOSE 95 181* -- 97 89 123* --  BUN 10 16 -- 19 21 23  --  CREATININE 0.35* 0.45* -- 0.48* 0.50 0.59 --  CALCIUM 8.8 9.0 -- 8.8 9.1 8.4 --  MG 2.1 -- 1.8 -- -- 2.1 2.3  PHOS -- 2.4 2.6 -- -- 4.5 --     Lab Results  Component Value Date   ALT 38* 08/21/2011   AST 21 08/21/2011   ALKPHOS 38* 08/21/2011   BILITOT 0.5 08/21/2011   ABG  Lab 08/19/11 1125 08/16/11 0805  PHART 7.463* 7.529*  PCO2ART 49.8* 47.2*  PO2ART 102.0* 99.0  HCO3 35.2* 39.3*  TCO2 36.7 41  O2SAT 98.7 98.0    No results found.  Lab 08/21/11 0555 08/20/11 0540  PROBNP 1035.0* 1485.0*      Lab 08/21/11 0555 08/20/11 0919  TROPONINI <0.30 <0.30    Lab 08/19/11 1125 08/16/11 0805  PHART 7.463* 7.529*  PCO2ART 49.8* 47.2*  PO2ART 102.0* 99.0  HCO3 35.2* 39.3*  TCO2 36.7 41  O2SAT 98.7 98.0     ASSESSMENT AND PLAN  PULMONARY ETT 5/24>>5/26  Acute on chronic hypoxic, hypercapnic respiratory failure secondary to AECOPD in  setting of narcotic/benzo use.  -on 5/29 and 5/20 clinically and on abg appears baseline  Plan: - Continued duoneb - continue prednisone, wean over 2 weeks as pulm status allows  -  CARDIOVASCULAR  Lab 08/21/11 0555 08/20/11 0540  PROBNP 1035.0* 1485.0*  Echo 5/25>>EF 65 to 70%, mild MR, lipomatous hypertrophy of atrial septum Doppler legs 5/25>>no DVT  Hx of A fib>>rate controlled.  No coumadin due to recent upper GI bleed Hx of HTN Acute Diastolic CHF On 5/30 - 2 episodes of sinus pauses after haldol started 5/29. QTc and troponin normal. Cards consult - considered it asymptomatic  Plan: - Monitor heart rate on tele - [facility] Cards will consider pauses significant when > 3 sec - - May need input from GI if/when coumadin can be resumed, or whether ASA would be better alternative; as of 5/31 fall risk and so no coumadin - low dose lasix with kcl - Continue bisoprolol (coreg  dc'ed)  - ARB (ACE nhibitor dc'ed 5/30)   RENAL Hx of renal mass>>seen on CT abd/pelvis 07/21/11  Plan: -Will need further assessment when more stable>>can be done as outpt   HEMATOLOGIC  Lab 08/21/11 0555 08/20/11 0540 08/19/11 0546  HGB 11.9* 10.9* 11.5*  HCT 37.1 33.9* 36.8  WBC 11.6* 10.6* 13.8*  PLT 262 229 252   Anemia with recent hx of GI bleed.  No evidence for bleeding at this time.  Plan: -F/u CBC -Transfuse for Hb < 7   GASTROINTESTINAL Gastric ulcer with upper GI bleed in April 2013. Plan: -BID protonix  - po foods  - at some point clarify with GI about restarting coumadin   INFECTIOUS Cultures: Blood 5/25>>neg Urine 5/25>>GNR - PSEUDOMONAS Sputum 5/25>>Reincubated on 5/27 >NEG  Lab 08/21/11 0555  PROCALCITON <0.10   Antibiotics: Vancomycin 05/25>>5/27 Zosyn 5/25>>5/28 Doxycycline 5/28>>5/29 Cefepime 5/29>>> (6/2)  Possible sources of infection are HCAP or UTI. Plan: -change to doxycycline 5/28 -> change to pseudomonal UTI coverage 5/29 -abx as above -check pct 5/31 A, consider narrow or d/c abx   NEUROLOGIC Chronic pain, anxiety - baseline vicodin use and ativan use for 8 years Acut post ICU Delirium -  On 5/29 - bad delirum - started haldol On 5/30 delirium much improved to near resolved. Pain improved with norco and tramadol -> haldol changed to seroquel On 5/31 -  Delirium again. Family convinced patient depressed  Plan: - REstart haldol 5/31 at 3mg  Q6h (sinus pauses considered asymptomatic pauses per cards and qtc normal 5/30) - Continue seroquel since 5/20Continue haldol but reduce dose (due to sinus pauses) - start anti-depressant duloxetine (Rx pain, anziety and depresion) - family insistent  - avoid benzo (though on it chronically, family wants to limit this in acute delirium setting) - limit pain medication to norco and ultram   ENDOCRINE Hx of hypothyroidism Plan: -synthroid 75 mcg daily   GLOBAL Deconditioned.  Daughter does not want patient to go to SNF rehab ("over medicating, drug patient and make patient shut up") and are not holisitic enough. She wants Inpatient rehab but they on 5/29 have said she is not a candidate. CareMgmt lmtcb to dtr about dc to home with home pt/ot/rn 5/31 pt refuses to cooperate. Unable to send home till plan of care clarified.  On 5/31: Dr Marchelle Gearing 30 minute discussion with out of state son, 2 daughters [name] (main person) and [employer] - general decline described x 6-12 months with loss of interest in TV etc. But no frank depressive symptoms (Dtrs convincned  this was depression and want anti depressant now). In [place] in April and May 2013 getting rehab. Mental status described as intact but with baseline chronic pain and anxiety and patient demanding repeated benzo and one of the daughters having to give to her (though they describe their experience at [facility] as negative and them over medicating patient). Currently appears to have acute deliirum that is markedly worse post ICU. Children were explained what this means and prognosis and outcome and fully understand that this is a set back. They have agreed with haldol. They want anti depressant. Goal is to go home with home assist (they do not want SNF rehab and patient declined by inpatient rehab)  BEST PRACTICE / DISPOSITION - Level of Care:  Tele - Primary Service: Dr Olin Hauser primary but on 5/29 dtr prefers PCCM stay on board as primary - Code Status:  Full code - Diet:  Heart healthy - DVT Px:  SCDs - GI Px:  Protonix - Consults: none - Family:extensive meeting 5/31 - Dispo: home when delirium clears with home assist   [place]. Kriste Basque, MD 08/22/11  @  8:56AM

## 2011-08-22 NOTE — Progress Notes (Signed)
Pt had 3.16 seconds pause, pt asymptomatic, resting in bed. MD on call paged, awaiting order at this time. Vital signs stable. Filed Vitals:   08/22/11 2345  BP: 128/65  Pulse: 74  Temp:   Resp: 18

## 2011-08-22 NOTE — Progress Notes (Signed)
[facility]  Subjective: Some SOB.  Objective: Vital signs in last 24 hours: Temp:  [97.9 F (36.6 C)-98.2 F (36.8 C)] 98.2 F (36.8 C) (06/01 0405) Pulse Rate:  [75-92] 88  (06/01 0405) Resp:  [18-20] 20  (06/01 0405) BP: (120-134)/(77-96) 120/77 mmHg (06/01 0405) SpO2:  [96 %-98 %] 97 % (06/01 0405) Weight:  [60.6 kg (133 lb 9.6 oz)] 60.6 kg (133 lb 9.6 oz) (06/01 0405) Last BM Date: 08/20/11  Intake/Output from previous day: 05/31 0701 - 06/01 0700 In: 530 [P.O.:480; IV Piggyback:50] Out: -  Intake/Output this shift:    Medications Current Facility-Administered Medications  Medication Dose Route Frequency Provider Last Rate Last Dose  . ipratropium (ATROVENT) nebulizer solution 0.5 mg  0.5 mg Nebulization Q6H Coralyn Helling, MD   0.5 mg at 08/22/11 0722   And  . albuterol (PROVENTIL) (5 MG/ML) 0.5% nebulizer solution 2.5 mg  2.5 mg Nebulization Q6H Coralyn Helling, MD   2.5 mg at 08/22/11 0722  . ipratropium (ATROVENT) nebulizer solution 0.5 mg  0.5 mg Nebulization Q2H PRN Coralyn Helling, MD       And  . albuterol (PROVENTIL) (5 MG/ML) 0.5% nebulizer solution 2.5 mg  2.5 mg Nebulization Q2H PRN Coralyn Helling, MD      . bisoprolol (ZEBETA) tablet 5 mg  5 mg Oral Daily Kalman Shan, MD   5 mg at 08/22/11 1036  . ceFEPIme (MAXIPIME) 1 g in dextrose 5 % 50 mL IVPB  1 g Intravenous Q12H Kalman Shan, MD   1 g at 08/22/11 1030  . chlorhexidine (PERIDEX) 0.12 % solution 15 mL  15 mL Mouth/Throat BID Coralyn Helling, MD   15 mL at 08/22/11 0800  . DULoxetine (CYMBALTA) DR capsule 20 mg  20 mg Oral Daily Kalman Shan, MD   20 mg at 08/22/11 1036  . furosemide (LASIX) injection 20 mg  20 mg Intravenous Daily Kalman Shan, MD   20 mg at 08/22/11 1036  . haloperidol lactate (HALDOL) injection 3 mg  3 mg Intravenous Q6H Kalman Shan, MD   3 mg at 08/22/11 0615  . HYDROcodone-acetaminophen (NORCO) 5-325 MG per tablet 1-2 tablet  1-2 tablet Oral Q6H  PRN Kalman Shan, MD   2 tablet at 08/20/11 1130  . levothyroxine (SYNTHROID, LEVOTHROID) tablet 75 mcg  75 mcg Oral QAC breakfast Coralyn Helling, MD   75 mcg at 08/22/11 0824  . losartan (COZAAR) tablet 25 mg  25 mg Oral Daily Kalman Shan, MD   25 mg at 08/22/11 1036  . pantoprazole (PROTONIX) EC tablet 40 mg  40 mg Oral BID AC Coralyn Helling, MD   40 mg at 08/22/11 0824  . predniSONE (DELTASONE) 30 mg  30 mg Oral Q breakfast Coralyn Helling, MD   30 mg at 08/22/11 0615  . QUEtiapine (SEROQUEL) tablet 25 mg  25 mg Oral QHS Kalman Shan, MD   25 mg at 08/21/11 2158  . traMADol (ULTRAM) tablet 50 mg  50 mg Oral Q8H PRN Lonia Farber, MD   50 mg at 08/22/11 1002    PE: General appearance: alert, cooperative and no distress Lungs: Poor effort. clear to auscultation bilaterally Heart: irregularly irregular rhythm Extremities: No LEE Pulses: 2+ and symmetric  Lab Results:   Basename 08/21/11 0555 08/20/11 0540  WBC 11.6* 10.6*  HGB 11.9* 10.9*  HCT 37.1 33.9*  PLT 262 229   BMET  Basename 08/21/11 0555 08/20/11 0919 08/19/11 2326  NA 132* 135 134*  K  3.9 3.6 4.1  CL 92* 92* 90*  CO2 34* 34* 39*  GLUCOSE 95 181* 97  BUN 10 16 19   CREATININE 0.35* 0.45* 0.48*  CALCIUM 8.8 9.0 8.8    Assessment/Plan   Active Problems:  HYPERTENSION  Renal mass, left  Atrial fibrillation, persistent with asymptomatic pauses  Warfarin anticoagulation, on hold secondary to GI bleed 07/21/11  Acute gastric ulcer with bleeding, 07/21/11  Respiratory failure, extubated 08/15/11  Metabolic encephalopathy  Normal left ventricular systolic function by 2D 08/15/11  Plan:  Afib rate controlled.  Resume anticoagulation per Dr. Erin Hearing note 08/20/11.  Bp Stable.   LOS: 8 days    HAGER, BRYAN 08/22/2011 12:44 PM   Afib at controlled rate.   Off anticoagulation - see discussion per Dr. Erin Hearing note.  We will be available if concerns / Questions arise. [facility] will sign off for  now.  Thanks,  Marykay Lex, M.D., M.S. [facility] [address] [place], [place]  [address]  [phone] Pager # [phone]  08/22/2011 1:10 PM

## 2011-08-23 NOTE — Progress Notes (Signed)
Name: Natalie [name] MRN: [id number] DOB: 22-Feb-1931    LOS: 9 Date of admit 08/14/2011 11:10 PM   PULMONARY / CRITICAL CARE MEDICINE  HPI:  78 yo female former smoker lived at home until April 2013 (pre April 2013 condition appears to be general failure to thrive and slow decline in functional status for 6-12 months and on baseline vicodin and ativan for chronic pain nos and chronic anxiety nos- when she was admitted for GI bleed. Then to Rehab and then admitted on 08/14/2011 with acute metabolic encephalopathy from acute on chronic hypercapnic respiratory failure with VDRF.  [name], narcotic use.  She was in hospital until April 30 for GI bleed from gastric ulcer and in rehab due to deconditioning.  She was tx for PNA in rehab. PMHx HTN, Hypothyroidism, PUD, A fib, COPD, Anxiety, Arthritis, Lt renal mass, Trigeminal neuralgia    SUBJECTIVE/OVERNIGHT/INTERVAL HX Required haldol during the night for agitation. Refuses to speak or cooperate.    Vital Signs: BP 136/91  Pulse 82  Temp(Src) 97.7 F (36.5 C) (Oral)  Resp 18  Ht 5\' 2"  (1.575 m)  Wt 63.4 kg (139 lb 12.4 oz)  BMI 25.56 kg/m2  SpO2 98%   Intake/Output Summary (Last 24 hours) at 08/23/11 1191 Last data filed at 08/23/11 0835  Gross per 24 hour  Intake    230 ml  Output    175 ml  Net     55 ml    Physical Exam: General - no distress, cachectic. Refuses to get up. HEENT - mm pink/moist Cardiac - irregular, no murmur Chest - prolonged exhalation, no wheeze/rales Abd - soft, non-tender Ext - no edema Neuro - Awake, no distress,refuses to follow commands. RASS +1. Confused, Asking family to leave   CBC  Lab 08/21/11 0555 08/20/11 0540 08/19/11 0546  HGB 11.9* 10.9* 11.5*  HCT 37.1 33.9* 36.8  WBC 11.6* 10.6* 13.8*  PLT 262 229 252    BMET  Lab 08/21/11 0555 08/20/11 0919 08/20/11 0540 08/19/11 2326 08/19/11 0546 08/18/11 0409 08/16/11 0930  NA 132* 135 -- 134* 138  139 --  K 3.9 3.6 -- -- -- -- --  CL 92* 92* -- 90* 94* 95* --  CO2 34* 34* -- 39* 39* 39* --  GLUCOSE 95 181* -- 97 89 123* --  BUN 10 16 -- 19 21 23  --  CREATININE 0.35* 0.45* -- 0.48* 0.50 0.59 --  CALCIUM 8.8 9.0 -- 8.8 9.1 8.4 --  MG 2.1 -- 1.8 -- -- 2.1 2.3  PHOS -- 2.4 2.6 -- -- 4.5 --     Lab Results  Component Value Date   ALT 38* 08/21/2011   AST 21 08/21/2011   ALKPHOS 38* 08/21/2011   BILITOT 0.5 08/21/2011   ABG  Lab 08/19/11 1125  PHART 7.463*  PCO2ART 49.8*  PO2ART 102.0*  HCO3 35.2*  TCO2 36.7  O2SAT 98.7    No results found.  Lab 08/21/11 0555 08/20/11 0540  PROBNP 1035.0* 1485.0*      Lab 08/21/11 0555 08/20/11 0919  TROPONINI <0.30 <0.30    Lab 08/19/11 1125  PHART 7.463*  PCO2ART 49.8*  PO2ART 102.0*  HCO3 35.2*  TCO2 36.7  O2SAT 98.7     ASSESSMENT AND PLAN  PULMONARY ETT 5/24>>5/26  Acute on chronic hypoxic, hypercapnic respiratory failure secondary to AECOPD in setting of narcotic/benzo use.  -on 5/29 and 5/20 clinically and on abg appears baseline  Plan: -  Continued duoneb - continue prednisone, wean over 2 weeks as pulm status allows  - on Maxipeme, Lasix   CARDIOVASCULAR  Lab 08/21/11 0555 08/20/11 0540  PROBNP 1035.0* 1485.0*  Echo 5/25>>EF 65 to 70%, mild MR, lipomatous hypertrophy of atrial septum Doppler legs 5/25>>no DVT  Hx of A fib>> w/ asymptomatic pauses.  No coumadin due to recent upper GI bleed Hx of HTN Acute Diastolic CHF On 5/30 - 2 episodes of sinus pauses after haldol started 5/29. QTc and troponin normal. Cards consult - considered it asymptomatic 6/2>  3.16 sec pause 6/1, pt asymptomatic, DrHarding w/ [facility] is aware...  Plan: - Monitor heart rate on tele - [facility] Cards will consider pauses significant when > 3 sec - - May need input from GI if/when coumadin can be resumed, or whether ASA would be better alternative; as of 5/31 fall risk and so no coumadin - low dose lasix with kcl - Continue  bisoprolol (coreg dc'ed)  - ARB (ACE nhibitor dc'ed 5/30)   RENAL Hx of renal mass>>seen on CT abd/pelvis 07/21/11  Plan: -Will need further assessment when more stable>>can be done as outpt   HEMATOLOGIC  Lab 08/21/11 0555 08/20/11 0540 08/19/11 0546  HGB 11.9* 10.9* 11.5*  HCT 37.1 33.9* 36.8  WBC 11.6* 10.6* 13.8*  PLT 262 229 252   Anemia with recent hx of GI bleed.  No evidence for bleeding at this time.  Plan: -F/u CBC -Transfuse for Hb < 7   GASTROINTESTINAL Gastric ulcer with upper GI bleed in April 2013. Plan: -BID protonix  - po foods  - at some point clarify with GI about restarting coumadin   INFECTIOUS Cultures: Blood 5/25>>neg Urine 5/25>>GNR - PSEUDOMONAS Sputum 5/25>>Reincubated on 5/27 >NEG  Lab 08/21/11 0555  PROCALCITON <0.10   Antibiotics: Vancomycin 05/25>>5/27 Zosyn 5/25>>5/28 Doxycycline 5/28>>5/29 Cefepime 5/29>>> (6/2)  Possible sources of infection are HCAP or UTI. Plan: -change to doxycycline 5/28 -> change to pseudomonal UTI coverage 5/29 -abx as above -check pct 5/31 A, consider narrow or d/c abx   NEUROLOGIC Chronic pain, anxiety - baseline vicodin use and ativan use for 8 years Acut post ICU Delirium -  On 5/29 - bad delirum - started haldol On 5/30 delirium much improved to near resolved. Pain improved with norco and tramadol -> haldol changed to seroquel On 5/31 -  Delirium again. Family convinced patient depressed  Plan: - REstart haldol 5/31 at 3mg  Q6h (sinus pauses considered asymptomatic pauses per cards and qtc normal 5/30) - Continue seroquel since 5/20Continue haldol but reduce dose (due to sinus pauses) - start anti-depressant duloxetine (Rx pain, anziety and depresion) - family insistent  - avoid benzo (though on it chronically, family wants to limit this in acute delirium setting) - limit pain medication to norco and ultram   ENDOCRINE Hx of hypothyroidism Plan: -synthroid 75 mcg  daily   GLOBAL Deconditioned. Daughter does not want patient to go to SNF rehab ("over medicating, drug patient and make patient shut up") and are not holisitic enough. She wants Inpatient rehab but they on 5/29 have said she is not a candidate. CareMgmt lmtcb to dtr about dc to home with home pt/ot/rn 5/31 pt refuses to cooperate. Unable to send home till plan of care clarified.  On 5/31: Dr Marchelle Gearing 30 minute discussion with out of state son, 2 daughters [name] (main person) and [employer] - general decline described x 6-12 months with loss of interest in TV etc. But no frank depressive symptoms (Dtrs  convincned this was depression and want anti depressant now). In [place] in April and May 2013 getting rehab. Mental status described as intact but with baseline chronic pain and anxiety and patient demanding repeated benzo and one of the daughters having to give to her (though they describe their experience at [facility] as negative and them over medicating patient). Currently appears to have acute deliirum that is markedly worse post ICU. Children were explained what this means and prognosis and outcome and fully understand that this is a set back. They have agreed with haldol. They want anti depressant. Goal is to go home with home assist (they do not want SNF rehab and patient declined by inpatient rehab)  BEST PRACTICE / DISPOSITION - Level of Care:  Tele - Primary Service: Dr Olin Hauser primary but on 5/29 dtr prefers PCCM stay on board as primary - Code Status:  Full code - Diet:  Heart healthy - DVT Px:  SCDs - GI Px:  Protonix - Consults: none - Family:extensive meeting 5/31 - Dispo: home when delirium clears with home assist   [place]. Kriste Basque, MD 08/23/11  @  9:22AM

## 2011-08-24 DIAGNOSIS — Z7901 Long term (current) use of anticoagulants: Secondary | ICD-10-CM

## 2011-08-24 LAB — BASIC METABOLIC PANEL
BUN: 17 mg/dL (ref 6–23)
CO2: 36 mEq/L — ABNORMAL HIGH (ref 19–32)
Calcium: 9.1 mg/dL (ref 8.4–10.5)
Chloride: 93 mEq/L — ABNORMAL LOW (ref 96–112)
Creatinine, Ser: 0.5 mg/dL (ref 0.50–1.10)
Glucose, Bld: 98 mg/dL (ref 70–99)

## 2011-08-24 LAB — CULTURE, BLOOD (ROUTINE X 2)
Culture  Setup Time: 201305250336
Culture: NO GROWTH

## 2011-08-24 MED ORDER — IPRATROPIUM BROMIDE 0.02 % IN SOLN: 0.5000 mg | RESPIRATORY_TRACT | Status: DC | PRN

## 2011-08-24 MED ORDER — ALBUTEROL SULFATE (5 MG/ML) 0.5% IN NEBU: 2.5000 mg | INHALATION_SOLUTION | RESPIRATORY_TRACT | Status: DC | PRN

## 2011-08-24 MED ORDER — PREDNISONE 20 MG PO TABS
20.0000 mg | ORAL_TABLET | Freq: Every day | ORAL | Status: DC
  Filled 2011-08-24: qty 1

## 2011-08-24 NOTE — Progress Notes (Signed)
Around 4am nurse tech noted that patient had not voided this shift when patient began to complain of feeling "numb" in the legs. Patient was assessed. Patient put on the [facility], unable to void at this time. Bladder scan showed PVR of >885. MD notified. Requested I&O cath. Orders for foley catheter. 1000 ml emptied from foley after insertion.  Thi Klich, Melida Quitter

## 2011-08-24 NOTE — Progress Notes (Signed)
HOME HEALTH AGENCIES SERVING [place]   Agencies that are Medicare-Certified and are affiliated with [facility]  Telephone Number Address  [facility]   The [facility] has ownership interest in this company; however, you are under no obligation to use this agency. [phone] or  [phone] [address] [place], [place] [address]   Agencies that are Medicare-Certified and are not affiliated with [facility] Telephone Number Address  [facility] [phone] Fax [phone] [address] [place], [place]  [address]  [facility] [phone] or [phone] Fax [phone] [address] [place], [place] [address]  [facility] [phone] Fax [phone] [address] [place], [place] [address]  [facility] [phone] Fax [phone] [address] [place], [place]  [address]  [facility] [phone] Fax [phone] [address] [place], [place] [address]  [facility] [phone] [address] [place], [place] [address]  [facility] [phone]  [address] [place], [place] [address]  [facility] [phone] or [phone] Fax [phone] [address] [place], [place]  [address]  [facility] [phone] Fax [phone] [address] [place], [place]  [address]  [facility]  [phone] Fax [phone] [address] [place], [place] [address]               Agencies that are not Medicare-Certified and are not affiliated with [facility] Agency Telephone Number Address  [facility], [place] [phone] or [phone] Fax [phone] [address] [place], [place]  [address]  [facility] [phone] Fax [phone] [address] [place], [place]  [address]  [facility]  [phone] Fax [phone] [address] [place], [place] [address]  [facility] [phone] Fax [phone] [address] [place], [place] [address]  [facility] [phone] or [phone] Fax [phone] [address] [place], [place]  [address]  [facility] [phone] or [phone] Fax [phone] [address] [place], [place]  [address]  [facility] [phone] Fax [phone] [address] [place], [place]  [address]  [facility] [phone] [address] [place], [place]  [address]  [facility] [phone] Fax [phone] [address] [place], [place]  [address]  [facility] [phone] Fax [phone] [address] [place], [place]  [address]  [facility] [phone] Fax [phone] [address] [place], [place] [address]  [facility] [phone] Fax [phone] [address] [place], [place]  [address]  In to offer patient choice of home health agencies. Patient stated,"You can go now, I don't want to talk." Phoned  daughter Natalie [name], list given over the phone. Daughter to let NCM know of choice at a later time.

## 2011-08-24 NOTE — Progress Notes (Signed)
Name: Natalie [name] MRN: [id number] DOB: 29-Sep-1930    LOS: 10 Date of admit 08/14/2011 11:10 PM   PULMONARY / CRITICAL CARE MEDICINE  HPI:  78 y/o female former smoker lived at home until April 2013 (pre April 2013 condition appears to be general failure to thrive and slow decline in functional status for 6-12 months and on baseline vicodin and ativan for chronic pain nos and chronic anxiety nos- when she was admitted for GI bleed. Then to Rehab and then admitted on 08/14/2011 with acute metabolic encephalopathy from acute on chronic hypercapnic respiratory failure with VDRF.  [name], narcotic use.  She was in hospital until April 30 for GI bleed from gastric ulcer and in rehab due to deconditioning.  She was tx for PNA in rehab. PMHx HTN, Hypothyroidism, PUD, A fib, COPD, Anxiety, Arthritis, Lt renal mass, Trigeminal neuralgia    SUBJECTIVE/OVERNIGHT/INTERVAL HX Patient reports she sat up in chair yesterday for 4 hours.  Wants to go home to her dog [facility]).   Denies complaints.     Vital Signs: BP 139/97  Pulse 94  Temp(Src) 97.5 F (36.4 C) (Axillary)  Resp 16  Ht 5\' 2"  (1.575 m)  Wt 135 lb 9.3 oz (61.5 kg)  BMI 24.80 kg/m2  SpO2 100%   Intake/Output Summary (Last 24 hours) at 08/24/11 1110 Last data filed at 08/24/11 0900  Gross per 24 hour  Intake    360 ml  Output   1600 ml  Net  -1240 ml    Physical Exam: General - elderly female in NAD HEENT - mm pink/moist Cardiac - irregular, no murmur Chest - prolonged exhalation, no wheeze/rales Abd - soft, non-tender Ext - no edema Neuro - Awake, no distress.  MAE, follows commands.     CBC  Lab 08/21/11 0555 08/20/11 0540 08/19/11 0546  HGB 11.9* 10.9* 11.5*  HCT 37.1 33.9* 36.8  WBC 11.6* 10.6* 13.8*  PLT 262 229 252    BMET  Lab 08/24/11 0627 08/21/11 0555 08/20/11 0919 08/20/11 0540 08/19/11 2326 08/19/11 0546 08/18/11 0409  NA 136 132* 135 -- 134* 138 --  K 3.9 3.9 --  -- -- -- --  CL 93* 92* 92* -- 90* 94* --  CO2 36* 34* 34* -- 39* 39* --  GLUCOSE 98 95 181* -- 97 89 --  BUN 17 10 16  -- 19 21 --  CREATININE 0.50 0.35* 0.45* -- 0.48* 0.50 --  CALCIUM 9.1 8.8 9.0 -- 8.8 9.1 --  MG -- 2.1 -- 1.8 -- -- 2.1  PHOS -- -- 2.4 2.6 -- -- 4.5     Lab 08/21/11 0555 08/20/11 0540  PROBNP 1035.0* 1485.0*      Lab 08/21/11 0555 08/20/11 0919  TROPONINI <0.30 <0.30     ASSESSMENT AND PLAN  PULMONARY ETT 5/24>>5/26  Acute on chronic hypoxic, hypercapnic respiratory failure secondary to AECOPD in setting of narcotic/benzo use.  -5/29 and 5/30 clinically and on abg appears baseline  Plan: - D/C duoneb. Cont PRN albuterol - D/C steroids - completed abx - wean O2 to off - Recheck CXR 6/4   CARDIOVASCULAR  Lab 08/21/11 0555 08/20/11 0540  PROBNP 1035.0* 1485.0*  Echo 5/25>>EF 65 to 70%, mild MR, lipomatous hypertrophy of atrial septum Doppler legs 5/25>>no DVT  CAF >> w/ asymptomatic pauses.  No coumadin due to recent upper GI bleed Hx of HTN Acute Diastolic CHF 5/30 - 2 episodes of sinus pauses after haldol  started 5/29. QTc and troponin normal. Cards consult - considered it asymptomatic 6/2>  3.16 sec pause 6/1, pt asymptomatic, Dr Herbie Baltimore w/ [facility] is aware  Plan: - Monitor heart rate on tele - [facility] Cards will consider pauses significant when > 3 sec - May need input from GI if/when coumadin can be resumed, or whether ASA would be better alternative; as of 5/31 fall risk and so no coumadin - low dose lasix with kcl - Continue bisoprolol  - ARB (ACE d/c'd 5/30)   RENAL Hx of renal mass>>seen on CT abd/pelvis 07/21/11  Plan: -Will need further assessment when more stable>>can be done as outpt   HEMATOLOGIC  Lab 08/21/11 0555 08/20/11 0540 08/19/11 0546  HGB 11.9* 10.9* 11.5*  HCT 37.1 33.9* 36.8  WBC 11.6* 10.6* 13.8*  PLT 262 229 252   Anemia with recent hx of GI bleed.  No evidence for bleeding at this time.  Plan: -F/u  CBC -Transfuse for Hb < 7   GASTROINTESTINAL Gastric ulcer with upper GI bleed in April 2013. Plan: -BID protonix - po foods -coumadin on hold -see above   INFECTIOUS Cultures: Blood 5/25>>neg Urine 5/25>>GNR - PSEUDOMONAS Sputum 5/25>>Reincubated on 5/27 >NEG  Lab 08/21/11 0555  PROCALCITON <0.10   Antibiotics: Vancomycin 05/25>>5/27 Zosyn 5/25>>5/28 Doxycycline 5/28>>5/29 Cefepime 5/29>>> 6/2  Possible sources of infection are HCAP or UTI. Plan: -abx as above    NEUROLOGIC Chronic pain, anxiety - baseline vicodin use and ativan use for 8 years Acut post ICU Delirium -  5/29 - bad delirum - started haldol 5/30 delirium much improved to near resolved. Pain improved with norco and tramadol -> haldol changed to seroquel 5/31 - Delirium again. Family convinced patient depressed  Plan: - Discontinue Haldol 6/3 - Continue seroquel since 5/20  - anti-depressant duloxetine (Rx pain, anziety and depresion) - family insistent - avoid benzo's - limit pain medication to norco and ultram   ENDOCRINE Hx of hypothyroidism Plan: -synthroid 75 mcg daily   GLOBAL Plan for d/c home 6/4 with maximum support.  Family does not want SNF and patient was declined for CIR.    BEST PRACTICE / DISPOSITION - Level of Care:  Tele - Primary Service: PCCM  - Code Status:  Full code - Diet:  Heart healthy - DVT Px:  SCDs - GI Px:  Protonix - Consults: none - Family:extensive meeting 5/31 - Dispo: home when sufficiently strong to go home with [facility]   Canary Brim, NP-C [facility] Pgr: [phone] or [phone]  Attending MD: Her delirium is fully resolved. The major impediment to her discharge is profound deconditioning. She is working with PT/OT. Needs D/C planning   Billy Fischer, MD;  PCCM service; Mobile [phone]

## 2011-08-24 NOTE — Progress Notes (Signed)
PT Cancellation Note  Treatment cancelled today due to patient's refusal to participate.  Pt becoming increasingly agitated with attempts to encourage OOB.  Pt states "I just want to be left all alone.".  Will follow as pt allows.  Newell Coral 08/24/2011, 12:55 PM  Newell Coral, PTA Acute Rehab [phone] (office)

## 2011-08-25 ENCOUNTER — Inpatient Hospital Stay (HOSPITAL_COMMUNITY): Payer: Medicare Other

## 2011-08-25 ENCOUNTER — Telehealth: Payer: Self-pay | Admitting: Internal Medicine

## 2011-08-25 LAB — PROTIME-INR: Prothrombin Time: 13.3 seconds (ref 11.6–15.2)

## 2011-08-25 MED ORDER — WARFARIN - PHARMACIST DOSING INPATIENT
Freq: Every day | Status: DC
  Administered 2011-08-25: 18:00:00

## 2011-08-25 MED ORDER — DULOXETINE HCL 30 MG PO CPEP
30.0000 mg | ORAL_CAPSULE | Freq: Every day | ORAL | Status: DC
  Administered 2011-08-26 – 2011-09-01 (×7): 30 mg via ORAL
  Filled 2011-08-25 (×7): qty 1

## 2011-08-25 MED ORDER — PANTOPRAZOLE SODIUM 40 MG PO TBEC
40.0000 mg | DELAYED_RELEASE_TABLET | Freq: Every day | ORAL | Status: DC
  Administered 2011-08-26 – 2011-09-01 (×7): 40 mg via ORAL
  Filled 2011-08-25 (×5): qty 1

## 2011-08-25 MED ORDER — WARFARIN SODIUM 2 MG PO TABS
2.0000 mg | ORAL_TABLET | Freq: Once | ORAL | Status: AC
  Administered 2011-08-25: 2 mg via ORAL
  Filled 2011-08-25: qty 1

## 2011-08-25 MED ORDER — TRAMADOL HCL 50 MG PO TABS
50.0000 mg | ORAL_TABLET | Freq: Four times a day (QID) | ORAL | Status: DC | PRN
  Administered 2011-08-25 – 2011-08-30 (×3): 50 mg via ORAL
  Filled 2011-08-25 (×3): qty 1

## 2011-08-25 NOTE — Progress Notes (Signed)
Name: Natalie [name] MRN: [id number] DOB: 1930/09/21    LOS: 11 Date of admit 08/14/2011 11:10 PM   PULMONARY / CRITICAL CARE MEDICINE  HPI:  78 y/o female former smoker lived at home until April 2013 (pre April 2013 condition appears to be general failure to thrive and slow decline in functional status for 6-12 months and on baseline vicodin and ativan for chronic pain nos and chronic anxiety nos- when she was admitted for GI bleed. Then to Rehab and then admitted on 08/14/2011 with acute metabolic encephalopathy from acute on chronic hypercapnic respiratory failure with VDRF.  [name], narcotic use.  She was in hospital until April 30 for GI bleed from gastric ulcer and in rehab due to deconditioning.  She was tx for PNA in rehab. PMHx HTN, Hypothyroidism, PUD, A fib, COPD, Anxiety, Arthritis, Lt renal mass, Trigeminal neuralgia    SUBJECTIVE/OVERNIGHT/INTERVAL HX Up in chair. No new complaints. No distress     Vital Signs: BP 118/73  Pulse 80  Temp(Src) 97.8 F (36.6 C) (Oral)  Resp 20  Ht 5\' 2"  (1.575 m)  Wt 60.9 kg (134 lb 4.2 oz)  BMI 24.56 kg/m2  SpO2 98%   Intake/Output Summary (Last 24 hours) at 08/25/11 1424 Last data filed at 08/25/11 1300  Gross per 24 hour  Intake    220 ml  Output    575 ml  Net   -355 ml    Physical Exam: General - elderly female in NAD HEENT - mm pink/moist Cardiac - irregular, no murmur Chest - Clear anteriorly and posteriorly   Abd - soft, non-tender Ext - no edema Neuro - Awake, no distress.  MAE, follows commands.     CBC  Lab 08/21/11 0555 08/20/11 0540 08/19/11 0546  HGB 11.9* 10.9* 11.5*  HCT 37.1 33.9* 36.8  WBC 11.6* 10.6* 13.8*  PLT 262 229 252    BMET  Lab 08/24/11 0627 08/21/11 0555 08/20/11 0919 08/20/11 0540 08/19/11 2326 08/19/11 0546  NA 136 132* 135 -- 134* 138  K 3.9 3.9 -- -- -- --  CL 93* 92* 92* -- 90* 94*  CO2 36* 34* 34* -- 39* 39*  GLUCOSE 98 95 181* -- 97 89    BUN 17 10 16  -- 19 21  CREATININE 0.50 0.35* 0.45* -- 0.48* 0.50  CALCIUM 9.1 8.8 9.0 -- 8.8 9.1  MG -- 2.1 -- 1.8 -- --  PHOS -- -- 2.4 2.6 -- --     Lab 08/21/11 0555 08/20/11 0540  PROBNP 1035.0* 1485.0*      Lab 08/21/11 0555 08/20/11 0919  TROPONINI <0.30 <0.30     ASSESSMENT AND PLAN  PULMONARY ETT 5/24>>5/26 CXR 6/4: improved infiltrates  Acute on chronic hypoxic, hypercapnic respiratory failure secondary to AECOPD in setting of narcotic/benzo use.  -5/29 and 5/30 clinically and on abg appears baseline  Plan: - Cont PRN albuterol - completed abx - wean O2 to off   CARDIOVASCULAR  Lab 08/21/11 0555 08/20/11 0540  PROBNP 1035.0* 1485.0*  Echo 5/25>>EF 65 to 70%, mild MR, lipomatous hypertrophy of atrial septum Doppler legs 5/25>>no DVT  CAF >> w/ asymptomatic pauses.  Has been off warfarin X 1 month for recent UGIB - now inactive Hx of HTN Acute Diastolic CHF 5/30 - 2 episodes of sinus pauses after haldol started 5/29. QTc and troponin normal. Cards consult - considered it asymptomatic 6/2>  3.16 sec pause 6/1, pt asymptomatic, Dr Herbie Baltimore w/  [facility] is aware  Plan: - Monitor heart rate on tele - [facility] Cards will consider pauses significant when > 3 sec - Resume warfarin - Continue bisoprolol  - Cont ARB (ACE d/c'd 5/30)   RENAL Hx of renal mass>>seen on CT abd/pelvis 07/21/11  Plan: - No eval indicated @ this time   HEMATOLOGIC  Lab 08/21/11 0555 08/20/11 0540 08/19/11 0546  HGB 11.9* 10.9* 11.5*  HCT 37.1 33.9* 36.8  WBC 11.6* 10.6* 13.8*  PLT 262 229 252   Anemia with recent hx of GI bleed.  No evidence for bleeding at this time.  Plan: - Cont to monitor CBC intermittently   GASTROINTESTINAL Gastric ulcer with upper GI bleed in April 2013. Plan: - PPI changed to daily from BID 6/4 - Cont to encourage - Should be OK to resume warfarin with target INR of 1.8 - 2.5   INFECTIOUS Cultures: Blood 5/25>>neg Urine 5/25>>GNR -  PSEUDOMONAS Sputum 5/25>>Reincubated on 5/27 >NEG  Lab 08/21/11 0555  PROCALCITON <0.10   Antibiotics: Vancomycin 05/25>>5/27 Zosyn 5/25>>5/28 Doxycycline 5/28>>5/29 Cefepime 5/29>>> 6/2  Possible sources of infection are HCAP or UTI - resolved Plan: - monitor off abx    NEUROLOGIC Chronic pain, anxiety - baseline vicodin use and ativan use for 8 years Acut post ICU Delirium -  5/29 - bad delirum - started haldol 5/30 delirium much improved to near resolved. Pain improved with norco and tramadol -> haldol changed to seroquel 5/31 - Delirium again. Family convinced patient depressed 6/3 - delirium resolved  Plan: - Discontinue Haldol 6/3 - Continue low dose seroquel   - Increase Cymbalta to 30 mg daily - avoid benzo's - Cont PRN Ultram   ENDOCRINE Hx of hypothyroidism Plan: -Cont synthroid 75 mcg daily   GLOBAL Plan for d/c home with maximum support.  Family does not want SNF and patient was declined for CIR.    BEST PRACTICE / DISPOSITION - Level of Care:  Tele - Primary Service: PCCM  - Code Status:  Full code - Diet:  Heart healthy - DVT Px:  SCDs, warfarin resumed 6/4 - GI Px:  Protonix - Consults: none - Family: daughter updated 6/4 - Dispo: home when sufficiently strong to go home with [facility]     Billy Fischer, MD;  PCCM service; Mobile [phone]

## 2011-08-25 NOTE — Telephone Encounter (Signed)
NOTE: I ASKED CALLER IF SHE HAD SPOKEN TO NURSE AT [facility]- SHE SAYS SHE HAD NOT- WANTED TO BE ADVISED THROUGH MR'S AND DS'S OFFICE.Hazel Sams

## 2011-08-25 NOTE — Progress Notes (Signed)
ANTICOAGULATION CONSULT NOTE - Initial Consult  Pharmacy Consult for Coumadin Indication: atrial fibrillation  Allergies  Allergen Reactions  . Levofloxacin Other (See Comments)    REACTION: unspecified per [facility]    Patient Measurements: Height: 5\' 2"  (157.5 cm) Weight: 134 lb 4.2 oz (60.9 kg) (bed scale) IBW/kg (Calculated) : 50.1   Vital Signs: Temp: 97.8 F (36.6 C) (06/04 1411) Temp src: Oral (06/04 1411) BP: 118/73 mmHg (06/04 1411) Pulse Rate: 80  (06/04 1411)  Labs:  Basename 08/25/11 1353 08/24/11 0627  HGB -- --  HCT -- --  PLT -- --  APTT -- --  LABPROT 13.3 --  INR 0.99 --  HEPARINUNFRC -- --  CREATININE -- 0.50  CKTOTAL -- --  CKMB -- --  TROPONINI -- --    Estimated Creatinine Clearance: 49 ml/min (by C-G formula based on Cr of 0.5).   Medical History: Past Medical History  Diagnosis Date  . Osteoporosis   . Hypertension   . Hypothyroidism   . Lower GI bleeding 07/21/11  . Atrial fibrillation   . COPD (chronic obstructive pulmonary disease)   . Pneumonia   . Shortness of breath 07/21/11    "anytime"  . Sinus headache 07/21/11    "all the time"  . Bladder infection, chronic   . Arthritis     "everywhere"  . Anxiety   . Chronic lower back pain   . Chronic abdominal pain   . Renal mass, left 2013  . PUD (peptic ulcer disease)   . Chronic cystitis   . Trigeminal neuralgia     /E-chart  . Chronic kidney disease     Assessment: 78 yo F admitted in April for GIB.  At that time Coumadin was stopped (was on for atrial fibrillation).  Then went to rehab and readmitted 5/24 with acute metabolic encephalopathy.  Now plan is to resume Coumadin with low INR goal.  What I can tell is that patient was on Coumadin 2.5 mg po daily at home in April, INR on 4/30 was 2.23 and then 2.77 5/1 (no Coumadin was given in hospital).   INR currently 0.99.  Goal of Therapy:  INR 1.8 - 2.5 Monitor platelets by anticoagulation protocol: Yes   Plan:  1.  Coumadin  2 mg po x 1 tonight 2.  Daily INR  Rolland Porter, Pharm.D., BCPS Clinical Pharmacist Pager: [phone] 08/25/2011,2:33 PM

## 2011-08-25 NOTE — Progress Notes (Signed)
Spoke with daughter, K.Parker. [facility] for [facility]. Appropriate referrals to be made.

## 2011-08-25 NOTE — Telephone Encounter (Signed)
Spoke with Natalie [name] and notified of recs per MR. She verbalized understanding and states nothing further needed.

## 2011-08-25 NOTE — Progress Notes (Signed)
Patient's urinary output has been low, patient asks to use bedside commode but is not urinating, MD on call made aware and ordered a foley be placed; reported off to night shift orders given; nurse will continue to monitor patient______________________________________________________________________D. Manson Passey RN

## 2011-08-25 NOTE — Progress Notes (Signed)
Courtesy: Dr. Sung Amabile requested that I meet the patient and that she needed a PCP. She tells me she has been seeing Drf. Clelia Croft at [facility] and is very happy with his care.

## 2011-08-25 NOTE — Telephone Encounter (Signed)
I spoke to Dr Sung Amabile and he will call dtr Gunnar Fusi on the 917 area code number sometime this afternoon. Best he talks directly

## 2011-08-25 NOTE — Progress Notes (Signed)
eLink Physician-Brief Progress Note Patient Name: Natalie [name] DOB: Mar 15, 1931 MRN: [id number]  Date of Service  08/25/2011   HPI/Events of Note   Oliguric.  Large bladder volume  eICU Interventions  Place foley   Intervention Category Intermediate Interventions: Oliguria - evaluation and management  Shan Levans 08/25/2011, 7:48 PM

## 2011-08-25 NOTE — Progress Notes (Signed)
Charge nurse spoke with patient's daughter concerning patient and discharge; daughter stated that she has now spoken with Dr. Sung Amabile and she was confused about when patient would be discharged because case management told her that patient would be discharged tomorrow; patient's daughter states that patient will be discharged Thursday or Friday per Dr. Simmonds_________________________________________________________D. Manson Passey RN

## 2011-08-25 NOTE — Telephone Encounter (Signed)
I spoke with paula and she states when she spoke with DS earlier when he came in and saw pt they were told pt would be d/c'd around the end of the week--he wanted pt to do a couple days of PT first to help make her stronger before she is d/c'd. Gunnar Fusi then stated her sister received a call from someone in the hospital stating pt will be d/c'd tomorrow. Gunnar Fusi is really confused bc they are being told different things and also they are not prepared for pt to be d'c/d tomorrow. Per Gunnar Fusi since MR originally saw pt that is why they are calling here to get clarification bc they can't get in touch with DS. Please advise MR thanks

## 2011-08-25 NOTE — Progress Notes (Signed)
Patient's family would like for patient's hair to be washed daily; patient and family does not want it washed with shampoo cap; patient's hair was washed from basin on today____________________________D. Manson Passey RN

## 2011-08-25 NOTE — Progress Notes (Signed)
Patient's daughter has called three times upset because she received a call stating that the patient will be discharged tomorrow and she states that the MD told her that the patient would not be discharged until later this week, the patient's daughter is very upset, Dr. Sung Amabile has been contacted and states that he will return phone call to nurse, at this point Dr. Sung Amabile is no longer on call and the physician on call states he does not know the patient and the patient and family will have to wait until Dr. Sung Amabile round on tomorrow morning to determine discharge date_________________________________________________________________________________D. Manson Passey RN

## 2011-08-26 LAB — PROTIME-INR: INR: 1.01 (ref 0.00–1.49)

## 2011-08-26 MED ORDER — ALPRAZOLAM 0.25 MG PO TABS
0.2500 mg | ORAL_TABLET | Freq: Four times a day (QID) | ORAL | Status: DC | PRN
  Administered 2011-08-26 – 2011-09-01 (×13): 0.25 mg via ORAL
  Filled 2011-08-26 (×12): qty 1

## 2011-08-26 MED ORDER — WARFARIN SODIUM 3 MG PO TABS
3.0000 mg | ORAL_TABLET | Freq: Once | ORAL | Status: AC
  Administered 2011-08-26: 3 mg via ORAL
  Filled 2011-08-26: qty 1

## 2011-08-26 NOTE — Progress Notes (Signed)
14 Fr. Foley Catheter inserted by Vashti Hey, NT. Patient tolerated well. Will continue to monitor

## 2011-08-26 NOTE — Progress Notes (Signed)
ANTICOAGULATION CONSULT NOTE - Follow Up Consult  Pharmacy Consult for Coumadin Indication: atrial fibrillation  Allergies  Allergen Reactions  . Levofloxacin Other (See Comments)    REACTION: unspecified per [facility]    Patient Measurements: Height: 5\' 2"  (157.5 cm) Weight: 133 lb 9.6 oz (60.601 kg) (bed scale) IBW/kg (Calculated) : 50.1   Vital Signs: Temp: 98.2 F (36.8 C) (06/05 0500) Temp src: Oral (06/05 0500) BP: 108/77 mmHg (06/05 0500) Pulse Rate: 96  (06/05 0500)  Labs:  Basename 08/26/11 0500 08/25/11 1353 08/24/11 0627  HGB -- -- --  HCT -- -- --  PLT -- -- --  APTT -- -- --  LABPROT 13.5 13.3 --  INR 1.01 0.99 --  HEPARINUNFRC -- -- --  CREATININE -- -- 0.50  CKTOTAL -- -- --  CKMB -- -- --  TROPONINI -- -- --    Estimated Creatinine Clearance: 48.9 ml/min (by C-G formula based on Cr of 0.5).  Assessment: 78 yo F admitted in April for GIB. At that time Coumadin was stopped (was on for atrial fibrillation). Then went to rehab and readmitted 5/24 with acute metabolic encephalopathy. Now plan is to resume Coumadin with lower INR goal. Patient was on Coumadin 2.5 mg po daily at home in April per previous records, INR on 4/30 was 2.23 and then 2.77 5/1 (no Coumadin was given in hospital).   INR is subtherapeutic - will give a little extra tonight. No bleeding noted.  Goal of Therapy:  INR 1.8-2.5 Monitor platelets by anticoagulation protocol: Yes   Plan:  Coumadin 3 mg po tonight INR daily  [facility], [address].D., BCPS Clinical Pharmacist 08/26/2011 10:38 AM

## 2011-08-26 NOTE — Progress Notes (Signed)
Pt had 1.33 second pause asymptomatic.  BP 128/87, P98.  Reported that pt has had pause >3.0 MD aware.  Will cont. To monitor.  Amanda Pea, RN

## 2011-08-26 NOTE — Progress Notes (Signed)
Utilization review completed.  

## 2011-08-26 NOTE — Progress Notes (Signed)
PT Cancellation Note  Treatment cancelled today due to patient's refusal to participate. Pt c/o pain.   Natalie [name] 08/26/2011, 3:41 PM Jaelan Rasheed L. Naji Mehringer DPT [phone]

## 2011-08-26 NOTE — Progress Notes (Signed)
Name: LAVENIA STUMPO MRN: [id number] DOB: [birth year]    LOS: 12 Date of admit 08/14/2011 11:10 PM   PULMONARY / CRITICAL CARE MEDICINE  HPI:  78 y/o female former smoker lived at home until April 2013 (pre April 2013 condition appears to be general failure to thrive and slow decline in functional status for 6-12 months and on baseline vicodin and ativan for chronic pain nos and chronic anxiety nos- when she was admitted for GI bleed. Then to Rehab and then admitted on 08/14/2011 with acute metabolic encephalopathy from acute on chronic hypercapnic respiratory failure with VDRF.  [name], narcotic use.  She was in hospital until April 30 for GI bleed from gastric ulcer and in rehab due to deconditioning.  She was tx for PNA in rehab. PMHx HTN, Hypothyroidism, PUD, A fib, COPD, Anxiety, Arthritis, Lt renal mass, Trigeminal neuralgia    SUBJECTIVE/OVERNIGHT/INTERVAL HX C/O pain all over. Nothing specific. Appetite is poor. C/O anxiety. No distress     Vital Signs: BP 156/88  Pulse 96  Temp(Src) 98.2 F (36.8 C) (Oral)  Resp 85  Ht 5\' 2"  (1.575 m)  Wt 60.601 kg (133 lb 9.6 oz)  BMI 24.44 kg/m2  SpO2 96%    Physical Exam: General - elderly female in NAD HEENT - mm pink/moist Cardiac - irregular, no murmur Chest - Clear anteriorly and posteriorly   Abd - soft, non-tender Ext - no edema Neuro - Awake, no distress.  MAE, follows commands.     CBC   BMET     ASSESSMENT AND PLAN  PULMONARY ETT 5/24>>5/26 CXR 6/4: improved infiltrates  Acute on chronic hypoxic, hypercapnic respiratory failure secondary to AECOPD in setting of narcotic/benzo use.  -5/29 and 5/30 clinically and on abg appears baseline  Plan: - Cont PRN albuterol - completed abx - wean O2 to off   CARDIOVASCULAR  Lab 08/21/11 0555 08/20/11 0540  PROBNP 1035.0* 1485.0*  Echo 5/25>>EF 65 to 70%, mild MR, lipomatous hypertrophy of atrial septum Doppler legs  5/25>>no DVT  CAF >> w/ asymptomatic pauses.  Has been off warfarin X 1 month for recent UGIB - now inactive Hx of HTN Acute Diastolic CHF 5/30 - 2 episodes of sinus pauses after haldol started 5/29. QTc and troponin normal. Cards consult - considered it asymptomatic 6/2>  3.16 sec pause 6/1, pt asymptomatic, Dr Herbie Baltimore w/ [facility] is aware  Plan: - Monitor heart rate on tele - [facility] Cards will consider pauses significant when > 3 sec - Resume warfarin - Continue bisoprolol  - Cont ARB (ACE d/c'd 5/30)   RENAL Hx of renal mass>>seen on CT abd/pelvis 07/21/11  Plan: - No eval indicated @ this time   HEMATOLOGIC  Lab 08/21/11 0555 08/20/11 0540  HGB 11.9* 10.9*  HCT 37.1 33.9*  WBC 11.6* 10.6*  PLT 262 229   Anemia with recent hx of GI bleed.  No evidence for bleeding at this time.  Plan: - Cont to monitor CBC intermittently   GASTROINTESTINAL Gastric ulcer with upper GI bleed in April 2013. Plan: - PPI changed to daily from BID 6/4 - Cont to encourage - Should be OK to resume warfarin with target INR of 1.8 - 2.5   INFECTIOUS Cultures: Blood 5/25>>neg Urine 5/25>>GNR - PSEUDOMONAS Sputum 5/25>>Reincubated on 5/27 >NEG  Lab 08/21/11 0555  PROCALCITON <0.10   Antibiotics: Vancomycin 05/25>>5/27 Zosyn 5/25>>5/28 Doxycycline 5/28>>5/29 Cefepime 5/29>>> 6/2  Possible sources of infection are HCAP or UTI -  resolved Plan: - monitor off abx    NEUROLOGIC Chronic pain, anxiety - baseline vicodin use and ativan use for 8 years Acut post ICU Delirium -  5/29 - bad delirum - started haldol 5/30 delirium much improved to near resolved. Pain improved with norco and tramadol -> haldol changed to seroquel 5/31 - Delirium again. Family convinced patient depressed 6/3 - delirium resolved  Plan: - Discontinue Haldol 6/3 - Continue low dose seroquel   - Increase Cymbalta to 30 mg daily - avoid benzo's - Cont PRN Ultram   ENDOCRINE Hx of  hypothyroidism Plan: -Cont synthroid 75 mcg daily   GLOBAL Plan for d/c home with maximum support.  Family does not want SNF and patient was declined for CIR.    BEST PRACTICE / DISPOSITION - Level of Care:  Tele - Primary Service: PCCM  - Code Status:  Full code - Diet:  Heart healthy - DVT Px:  SCDs, warfarin resumed 6/4 - GI Px:  Protonix - Consults: none - Family: daughter updated 6/4 - Dispo: home when sufficiently strong to go home with [facility]   Anticipate D/C home within next 48 hrs   Billy Fischer, MD;  PCCM service; Mobile [phone]

## 2011-08-27 ENCOUNTER — Telehealth: Payer: Self-pay | Admitting: Internal Medicine

## 2011-08-27 LAB — CBC
HCT: 37.3 % (ref 36.0–46.0)
MCV: 87.8 fL (ref 78.0–100.0)
RBC: 4.25 MIL/uL (ref 3.87–5.11)
WBC: 11.3 10*3/uL — ABNORMAL HIGH (ref 4.0–10.5)

## 2011-08-27 LAB — BASIC METABOLIC PANEL
CO2: 34 mEq/L — ABNORMAL HIGH (ref 19–32)
Chloride: 89 mEq/L — ABNORMAL LOW (ref 96–112)
Sodium: 132 mEq/L — ABNORMAL LOW (ref 135–145)

## 2011-08-27 LAB — PROTIME-INR
INR: 1.23 (ref 0.00–1.49)
Prothrombin Time: 15.8 seconds — ABNORMAL HIGH (ref 11.6–15.2)

## 2011-08-27 MED ORDER — WARFARIN SODIUM 3 MG PO TABS
3.0000 mg | ORAL_TABLET | Freq: Once | ORAL | Status: AC
  Administered 2011-08-27: 3 mg via ORAL
  Filled 2011-08-27 (×2): qty 1

## 2011-08-27 MED ORDER — IPRATROPIUM BROMIDE 0.02 % IN SOLN
0.5000 mg | Freq: Four times a day (QID) | RESPIRATORY_TRACT | Status: DC
  Administered 2011-08-27 – 2011-08-28 (×4): 0.5 mg via RESPIRATORY_TRACT
  Filled 2011-08-27 (×6): qty 2.5

## 2011-08-27 MED ORDER — LEVALBUTEROL HCL 0.63 MG/3ML IN NEBU
0.6300 mg | INHALATION_SOLUTION | Freq: Four times a day (QID) | RESPIRATORY_TRACT | Status: DC
  Administered 2011-08-27 – 2011-08-28 (×4): 0.63 mg via RESPIRATORY_TRACT
  Filled 2011-08-27 (×16): qty 3

## 2011-08-27 MED ORDER — HYDRALAZINE HCL 20 MG/ML IJ SOLN
5.0000 mg | Freq: Four times a day (QID) | INTRAMUSCULAR | Status: DC | PRN
  Filled 2011-08-27: qty 0.25

## 2011-08-27 NOTE — Discharge Summary (Signed)
Critical care sign off note     Patient ID: Natalie [name] MRN: [id number] DOB/AGE: 76-Dec-1932 76 y.o.  Admit date: 08/14/2011 Discharge date: 09/01/2011  Admission Diagnoses: Acute encephalopathy Acute respiratory failure  Discharge Diagnoses:  Patient Active Problem List  Diagnoses  . HYPOTHYROIDISM  . ANXIETY DISORDER  . DEPRESSION  . HYPERTENSION  . Renal mass, left  . Chronic back pain  . Atrial fibrillation, persistent with asymptomatic pauses  . Warfarin anticoagulation, on hold secondary to GI bleed 07/21/11  . Acute gastric ulcer with bleeding, 07/21/11  . Respiratory failure, extubated 08/15/11   Chronic respiratory failure: deconditioning.    Normal left ventricular systolic function by 2D 08/15/11    Urinary retention       Resolved issues during this stay Acute delirium pseudomonas UTI HCAP  Significant Hospital tests/ studies/ interventions and procedures  Echo 5/25>>EF 65 to 70%, mild MR, lipomatous hypertrophy of atrial septum  ETT 5/24>>5/26 Doppler legs 5/25>>no DVT  Cultures:  Blood 5/25>>neg  Urine 5/25>>GNR - PSEUDOMONAS  Sputum 5/25>>Reincubated on 5/27 >NEG  Antibiotics.  Vancomycin 05/25>>5/27  Zosyn 5/25>>5/28  Doxycycline 5/28>>5/29  Cefepime 5/29>>> 6/2  HPI 78 y/o female former smoker lived at home until April 2013 (pre April 2013 condition appears to be general failure to thrive and slow decline in functional status for 6-12 months and on baseline vicodin and ativan for chronic pain nos and chronic anxiety nos- when she was admitted for GI bleed. Then to Rehab and then admitted on 08/14/2011 with acute metabolic encephalopathy from acute on chronic hypercapnic respiratory failure with VDRF. [name], [name] She was tx for PNA in rehab. PMHx HTN, Hypothyroidism, PUD, A fib, COPD, Anxiety,  Arthritis, Lt renal mass, Trigeminal neuralgia  Hospital Course:  Acute on chronic hypoxic, hypercapnic respiratory failure secondary to AECOPD in setting of narcotic/benzo use.  Initially admitted to the ICU on 5/24 with acute encephalopathy with resultant respiratory failure which required ventilatory support. She was supported on the ventilator, treated with empiric antibiotics, bronchodilators, and also decreased sedating medication regimen. She was extubated successfully on 5/26. Has had very slow progress.  Deconditioning, depression, and narcotic/benzo dep will continue to be her primary risk for decline. [name], [name]  Plan:  - Cont maintenance and PRN BDs  - completed abx  -push rehab efforts with home health assist.    CAF >> w/ asymptomatic pauses. Has been off warfarin X 1 month for recent UGIB - now inactive. Seen by Cardiology. Now back on coumadin.  Lab Results  Component Value Date   INR 2.50* 09/01/2011   INR 2.21* 08/31/2011   INR 2.20* 08/30/2011  Plan - [facility] Cards will consider pauses significant when > 3 sec  - Resumed warfarin -goal inr 1.8-2.5 - Continue bisoprolol  - needs INR check   HTN Plan -cont arb  Hx of renal mass>>seen on CT abd/pelvis 07/21/11  Plan:  - No eval indicated @ this time-->this should be evaluated in out-pt setting by urology. Dr Isabel Caprice was contacted w/ [facility]. He stated that they would be happy to eval in the out-pt setting.   Urinary Retention: failed 2 attempts at catheter removal. Had Dr Isabel Caprice see her in the hospital.   He felt this "this is  probably multifactorial and is unlikely to resolve in the next 2 days. We can initiate an alpha blocker to help her relax her bladder neck and to improve bladder contractility. Ultimately if she has ongoing issues with urinary retention and/or incomplete bladder emptying she will need urodynamics and further assessment.  There is really no indication for continued acute hospitalization just due to inability to void inserted from a urologic perspective she can be discharged home with a Foley catheter with plans on initiating a voiding trial and additional workup as an outpatient. The patient has a small renal mass that is suspicious for renal cell carcinoma. It is less than 2 cm in size and she is close to age 58. I would recommend at this time serial monitoring and if there is any interim growth biopsy by interventional radiology with probable ablative procedure". Plan - follow up appointment made at Dr Ellin Goodie office.    Anemia with recent hx of Gastric Ulcer and UGI bleed. No evidence for bleeding at this time.  Plan:  - cont bid PPI - Cont to monitor CBC intermittently, as back on coumadin.   Presumed HCAP, had infiltrates on CXR.  Also UC positive for Pseudomonas UTI: Her CXR has improved. There is no current evidence of infection. She has completed full course of appropriate antibiotic therapy.   Chronic pain, anxiety - baseline vicodin use and ativan use for 8 years  Acut post ICU Delirium/ acute encephalopathy - on 5/29 after extubation was noted to have severe acute delirium. She was started on haldol for this. Her pain was treated with Norco and tramadol. As her symptoms improved we transitioned her to seroquel instead of haldol. On 6/3 the delirium had resolved. She is now off Seroquel   Depression. This appears to be a major barrier to her progress. Cymbalta was increased to 30mg  Plan: -continue to follow and rx in out-pt setting   Hx of hypothyroidism  Plan:  -Continue synthroid 75 mcg daily  Discharge Exam: BP 143/96  Pulse 96  Temp(Src) 97.6 F (36.4 C) (Oral)  Resp 20  Ht 5\' 2"  (1.575 m)  Wt 59.3 kg (130 lb 11.7 oz)  BMI 23.91 kg/m2  SpO2 97% 2 liters  Physical Exam:  General - elderly female in NAD  HEENT - mm pink/moist  Cardiac - irregular, no murmur  Chest - occasional rhonchi  that improve w/ cough  Abd - soft, non-tender  Ext - no edema  Neuro - Awake, no distress. MAE, follows commands.   Labs at discharge Lab Results  Component Value Date   CREATININE 0.42* 08/31/2011   BUN 5* 08/31/2011   NA 133* 08/31/2011   K 3.0* 08/31/2011   CL 89* 08/31/2011   CO2 37* 08/31/2011   Lab Results  Component Value Date   WBC 6.7 08/31/2011   HGB 11.7* 08/31/2011   HCT 35.6* 08/31/2011   MCV 87.5 08/31/2011   PLT 251 08/31/2011   Lab Results  Component Value Date   ALT 38* 08/21/2011   AST 21 08/21/2011   ALKPHOS 38* 08/21/2011   BILITOT 0.5 08/21/2011   Lab Results  Component Value Date   INR 2.50* 09/01/2011   INR 2.21* 08/31/2011   INR 2.20* 08/30/2011    Current radiology studies No results found.  Disposition: Home with family and home health assist. She really needs SNF placement, however the family desires her to be at home.   Discharge Orders    Future Appointments: Provider: Department: Dept Phone:  Center:   10/06/2011 10:30 AM Etta Grandchild, MD [facility] [phone] [facility]      Dr Isabel Caprice on 6/13 at 1145 am call to confirm [phone]   Scheduled Meds: Medication List  As of 09/01/2011  1:13 PM   STOP taking these medications         amLODipine 2.5 MG tablet      benazepril 40 MG tablet      calcium citrate 950 MG tablet      carvedilol 12.5 MG tablet      dicyclomine 10 MG capsule      escitalopram 10 MG tablet      HYDROcodone-acetaminophen 5-500 MG per tablet      KAOPECTATE 262 MG/15ML suspension      LORazepam 0.5 MG tablet      montelukast 10 MG tablet         TAKE these medications         ALPRAZolam 0.25 MG tablet   Commonly known as: XANAX   Take 1 tablet (0.25 mg total) by mouth every 8 (eight) hours as needed for anxiety (for anxiety ).      bisoprolol 5 MG tablet   Commonly known as: ZEBETA   Take 1 tablet (5 mg total) by mouth daily.      DULoxetine 30 MG capsule   Commonly known as: CYMBALTA   Take 1 capsule (30 mg  total) by mouth daily.      hydrALAZINE 20 MG/ML injection   Commonly known as: APRESOLINE   Inject 0.25 mLs (5 mg total) into the vein every 6 (six) hours as needed (SBP > 170 or DBP > 105).      HYDROcodone-acetaminophen 5-325 MG per tablet   Commonly known as: NORCO   Take 1-2 tablets by mouth every 6 (six) hours as needed.      lansoprazole 30 MG capsule   Commonly known as: PREVACID   Take 30 mg by mouth 2 (two) times daily before a meal.      levalbuterol 0.63 MG/3ML nebulizer solution   Commonly known as: XOPENEX   Take 3 mLs (0.63 mg total) by nebulization every 6 (six) hours as needed for wheezing or shortness of breath.      levothyroxine 75 MCG tablet   Commonly known as: SYNTHROID, LEVOTHROID   Take 75 mcg by mouth daily.      losartan 25 MG tablet   Commonly known as: COZAAR   Take 1 tablet (25 mg total) by mouth daily.      Tamsulosin HCl 0.4 MG Caps   Commonly known as: FLOMAX   Take 1 capsule (0.4 mg total) by mouth daily.      traMADol 50 MG tablet   Commonly known as: ULTRAM   Take 1 tablet (50 mg total) by mouth every 6 (six) hours as needed.      warfarin 0.5 mg Tabs   Commonly known as: COUMADIN   Take 0.5 tablets (0.5 mg total) by mouth one time only at 6 PM.              . bisoprolol  5 mg Oral Daily  . chlorhexidine  15 mL Mouth/Throat BID  . DULoxetine  30 mg Oral Daily  . levothyroxine  75 mcg Oral QAC breakfast  . losartan  25 mg Oral Daily  . pantoprazole  40 mg Oral Q1200  . Tamsulosin HCl  0.4 mg Oral Daily  . warfarin  0.5 mg Oral ONCE-1800  .  warfarin  1 mg Oral ONCE-1800  . Warfarin - Pharmacist Dosing Inpatient   Does not apply q1800   Continuous Infusions:  PRN Meds:.ALPRAZolam, hydrALAZINE, HYDROcodone-acetaminophen, levalbuterol, traMADol Follow-up Information    Follow up with Sanda Linger, MD on 10/06/2011. Please cancel this appointment if pt stays at nursing facility past scheduled appointment date.    Contact  information:   [address] [facility] [address] [place], [address] [place] [address] [phone]       Follow up with Valetta Fuller, MD on 09/03/2011. (1145 am)    Contact information:   [address] [place], [address] [facility] [place] [address] [phone] Call to confirm         Discharged Condition: medically cleared for discharge.   Special instructions: leave foley catheter in due to urinary retention. This is being followed by Urology (Dr Isabel Caprice).  Signed: BABCOCK,PETE 09/01/2011, 1:13 PM   This discharge plan was initially formulated by ACNP Tanja Port and me during the week prior to her actual discharge. This summary has been amended to reflect significant changes that occurred since the original plan was established. She was seen by Dr Craige Cotta on the day of discharge   Billy Fischer, MD;  PCCM service; Mobile [phone]

## 2011-08-27 NOTE — Telephone Encounter (Signed)
I spoke to Dr Sung Amabile and he told me he has been talking daily to daugher [employer]. Is there anything specific they want to talk to me about ? Or, can they continue to address with Dr Sung Amabile ?

## 2011-08-27 NOTE — Progress Notes (Signed)
Pt's BP 151/102 Dr. Clayton Lefort notified and ordered Hydralazine PRN.  Also that pt's daughter mentioned that pt stated she has had loose stool x1. Today.   Will continue to monitor. BP and for further loose stools.  Amanda Pea, Charity fundraiser.

## 2011-08-27 NOTE — Progress Notes (Signed)
ANTICOAGULATION CONSULT NOTE - Follow Up Consult  Pharmacy Consult for Coumadin Indication: atrial fibrillation  Allergies  Allergen Reactions  . Levofloxacin Other (See Comments)    REACTION: unspecified per [facility]    Patient Measurements: Height: 5\' 2"  (157.5 cm) Weight: 132 lb 11.2 oz (60.192 kg) (bed scale) IBW/kg (Calculated) : 50.1   Vital Signs: Temp: 98.2 F (36.8 C) (06/06 0458) Temp src: Oral (06/06 0458) BP: 126/74 mmHg (06/06 0458) Pulse Rate: 86  (06/06 0458)  Labs:  Basename 08/27/11 0535 08/26/11 0500 08/25/11 1353  HGB 12.0 -- --  HCT 37.3 -- --  PLT 243 -- --  APTT -- -- --  LABPROT 15.8* 13.5 13.3  INR 1.23 1.01 0.99  HEPARINUNFRC -- -- --  CREATININE 0.52 -- --  CKTOTAL -- -- --  CKMB -- -- --  TROPONINI -- -- --    Estimated Creatinine Clearance: 48.7 ml/min (by C-G formula based on Cr of 0.52).  Assessment: 78 yo F admitted in April for GIB. At that time Coumadin was stopped (was on for atrial fibrillation). Then went to rehab and readmitted 5/24 with acute metabolic encephalopathy. Now plan is to resume Coumadin with lower INR goal. Patient was on Coumadin 2.5 mg po daily at home in April per previous records, INR on 4/30 was 2.23 and then 2.77 5/1 (no Coumadin was given in hospital).   INR remains subtherapeutic but with good trend up. Goal of Therapy:  INR 1.8-2.5 Monitor platelets by anticoagulation protocol: Yes   Plan:  Coumadin 3 mg po tonight INR daily  Diasia Henken, Pharm.D. Clinical Pharmacist 08/27/2011 9:19 AM

## 2011-08-27 NOTE — Telephone Encounter (Signed)
MR, please advise.  

## 2011-08-27 NOTE — Progress Notes (Signed)
Pt BP 122/88 P86.  Pt asymptomatic.  Hydralazine not need at this time.  Pt states I feel fine since I got my xanax.  Will cont. To monitor.  Amanda Pea, RN

## 2011-08-27 NOTE — Progress Notes (Signed)
Pt had 554 from bladder scan,  no U0 noted  today, informed by NT.  Pt had soft stool which might had some urine in it, but unsure.  Dr. Sharol Harness informed.  Instructed to encourage pt to get up to use BSC .   NT notified.  Will lcont. To monitor.  Natalie [name], Charity fundraiser.

## 2011-08-27 NOTE — Telephone Encounter (Signed)
Spoke with Gunnar Fusi. She states that after having had long conversation with DS this am, she is "up to speed now" and there is nothing further needed.

## 2011-08-27 NOTE — Significant Event (Signed)
Pt noted to have elevated diastolic BP.  Will give prn IV hydralazine.  Coralyn Helling, MD 08/27/2011, 3:21 PM

## 2011-08-27 NOTE — Progress Notes (Signed)
In and Out Cath completed, 750 mls drained, no discomfort voiced or noted and perineal area appeared red and chaffed

## 2011-08-27 NOTE — Progress Notes (Signed)
Name: Natalie [name] MRN: [id number] DOB: 07-18-1930    LOS: 13 Date of admit 08/14/2011 11:10 PM   PULMONARY / CRITICAL CARE MEDICINE  HPI:  78 y/o female former smoker lived at home until April 2013 (pre April 2013 condition appears to be general failure to thrive and slow decline in functional status for 6-12 months and on baseline vicodin and ativan for chronic pain nos and chronic anxiety nos- when she was admitted for GI bleed. Then to Rehab and then admitted on 08/14/2011 with acute metabolic encephalopathy from acute on chronic hypercapnic respiratory failure with VDRF.  [name], narcotic use.  She was in hospital until April 30 for GI bleed from gastric ulcer and in rehab due to deconditioning.  She was tx for PNA in rehab. PMHx HTN, Hypothyroidism, PUD, A fib, COPD, Anxiety, Arthritis, Lt renal mass, Trigeminal neuralgia    SUBJECTIVE/OVERNIGHT/INTERVAL HX C/O pain all over. Cough is poor. She does endorse she is depressed. She is refusing to get OOB.    Vital Signs: BP 126/74  Pulse 86  Temp(Src) 98.2 F (36.8 C) (Oral)  Resp 20  Ht 5\' 2"  (1.575 m)  Wt 60.192 kg (132 lb 11.2 oz)  BMI 24.27 kg/m2  SpO2 100% 2 liters    Physical Exam: General - elderly female in NAD HEENT - mm pink/moist Cardiac - irregular, no murmur Chest - occasional rhonchi that improve w/ cough  Abd - soft, non-tender Ext - no edema Neuro - Awake, no distress.  MAE, follows commands.       ASSESSMENT AND PLAN  PULMONARY ETT 5/24>>5/26 CXR 6/4: improved infiltrates  Acute on chronic hypoxic, hypercapnic respiratory failure secondary to AECOPD in setting of narcotic/benzo use.  -5/29 and 5/30 clinically and on abg appears baseline. Deconditioning, depression, and narcotic/benzo dep will continue to be her primary risk for decline.  Plan: - Cont maintenance and PRN BDs - completed abx - will need SNF   CARDIOVASCULAR  Lab 08/21/11 0555  PROBNP  1035.0*  Echo 5/25>>EF 65 to 70%, mild MR, lipomatous hypertrophy of atrial septum Doppler legs 5/25>>no DVT  CAF >> w/ asymptomatic pauses.  Has been off warfarin X 1 month for recent UGIB - now inactive Hx of HTN Acute Diastolic CHF 5/30 - 2 episodes of sinus pauses after haldol started 5/29. QTc and troponin normal. Cards consult - considered it asymptomatic 6/2>  3.16 sec pause 6/1, pt asymptomatic, Dr Herbie Baltimore w/ [facility] is aware Plan: - Monitor heart rate on tele - [facility] Cards will consider pauses significant when > 3 sec - Resume warfarin - Continue bisoprolol  - Cont ARB (ACE d/c'd 5/30)   RENAL Hx of renal mass>>seen on CT abd/pelvis 07/21/11 Plan: - No eval indicated @ this time   HEMATOLOGIC  Lab 08/27/11 0535 08/21/11 0555  HGB 12.0 11.9*  HCT 37.3 37.1  WBC 11.3* 11.6*  PLT 243 262   Anemia with recent hx of GI bleed.  No evidence for bleeding at this time.  Plan: - Cont to monitor CBC intermittently   GASTROINTESTINAL Gastric ulcer with upper GI bleed in April 2013. Plan: - PPI changed to daily from BID 6/4 - Cont to encourage - Should be OK to resume warfarin with target INR of 1.8 - 2.5   INFECTIOUS Cultures: Blood 5/25>>neg Urine 5/25>>GNR - PSEUDOMONAS Sputum 5/25>>Reincubated on 5/27 >NEG  Lab 08/21/11 0555  PROCALCITON <0.10   Antibiotics: Vancomycin 05/25>>5/27 Zosyn 5/25>>5/28 Doxycycline 5/28>>5/29  Cefepime 5/29>>> 6/2  Possible sources of infection are HCAP or UTI - resolved Plan: - monitor off abx   NEUROLOGIC Chronic pain, anxiety - baseline vicodin use and ativan use for 8 years Acut post ICU Delirium -  5/29 - bad delirum - started haldol 5/30 delirium much improved to near resolved. Pain improved with norco and tramadol -> haldol changed to seroquel 5/31 - Delirium again. Family convinced patient depressed 6/3 - delirium resolved 6/6: depression seemingly the overwhelming issue at this point.  Plan: - Continue low dose  seroquel   - Increased Cymbalta to 30 mg daily - avoid benzo's - Cont PRN Ultram  ENDOCRINE Hx of hypothyroidism Plan: -Cont synthroid 75 mcg daily   GLOBAL Plan for d/c home with maximum support, family does not want SNF, but pt does.    BEST PRACTICE / DISPOSITION - Level of Care:  Tele - Primary Service: PCCM  - Code Status:  Full code - Diet:  Heart healthy - DVT Px:  SCDs, warfarin resumed 6/4 - GI Px:  Protonix - Consults: none - Family: daughter updated 6/4 - Dispo: home when sufficiently strong to go home with [facility]   Anders Simmonds, ACNP   PCCM Attending: Seen on CCM rounds this morning with ACNP above.  Pt examined and database reviewed. I agree with above findings, assessment and plan as reflected in the note above. The pt expresses a desire to go back to SNF. I will discuss with pt's daughter   Billy Fischer, MD;  PCCM service; Mobile [phone]

## 2011-08-27 NOTE — Plan of Care (Signed)
Problem: Phase II Progression Outcomes Goal: ADLs completed with minimal assistance Outcome: Not Met (add Reason) Pt with assist. With movement in bed.

## 2011-08-27 NOTE — Progress Notes (Signed)
CSW spoke with patient's daughter this am and they want to re-consider SNF. Patient has already been faxed out. Patient's daughter would like to see about availability at [facility]. CSW will contact the facility.   Sabino Niemann, MSW, [employer] [phone]

## 2011-08-28 ENCOUNTER — Telehealth: Payer: Self-pay | Admitting: Internal Medicine

## 2011-08-28 DIAGNOSIS — R339 Retention of urine, unspecified: Secondary | ICD-10-CM | POA: Diagnosis not present

## 2011-08-28 LAB — T4, FREE: Free T4: 1.65 ng/dL (ref 0.80–1.80)

## 2011-08-28 LAB — TSH: TSH: 2.765 u[IU]/mL (ref 0.350–4.500)

## 2011-08-28 MED ORDER — WARFARIN SODIUM 1 MG PO TABS
1.0000 mg | ORAL_TABLET | Freq: Once | ORAL | Status: AC
  Administered 2011-08-28: 1 mg via ORAL
  Filled 2011-08-28: qty 1

## 2011-08-28 NOTE — Progress Notes (Signed)
Physical Therapy Treatment Patient Details Name: Natalie [name] MRN: [id number] DOB: 21-May-1930 Today's Date: 08/28/2011 Time: 4098-1191 PT Time Calculation (min): 14 min  PT Assessment / Plan / Recommendation Comments on Treatment Session  Pt cooperative today.  Pain and lethargy continue.  Daught arrived after session and was pleased with pt's progress.     Follow Up Recommendations  Skilled nursing facility;Supervision/Assistance - 24 hour    Barriers to Discharge        Equipment Recommendations  Defer to next venue    Recommendations for Other Services    Frequency Min 2X/week   Plan Discharge plan remains appropriate;Frequency remains appropriate    Precautions / Restrictions Precautions Precautions: Fall Restrictions Weight Bearing Restrictions: No   Pertinent Vitals/Pain Pt continues to c/o 6/1-8/10 pain all over.  No pain meds needed per pt.     Mobility  Bed Mobility Bed Mobility: Sit to Supine Supine to Sit: 4: Min assist;HOB elevated;With rails Sitting - Scoot to Edge of Bed: 4: Min assist Sit to Supine: Not Tested (comment) Details for Bed Mobility Assistance: For LE management pt pulling on rail and my hand to scoot ot EOB Transfers Transfers: Sit to Stand;Stand to Sit;Stand Pivot Transfers Sit to Stand: 4: Min assist;With upper extremity assist;From bed;From chair/3-in-1;With armrests Stand to Sit: 4: Min assist;With upper extremity assist;To chair/3-in-1 Stand Pivot Transfers: 4: Min assist Squat Pivot Transfers: Not tested (comment) Details for Transfer Assistance: Assist for balance and to translate trunk anterior over BOS.  Cues for sequence. Ambulation/Gait Ambulation/Gait Assistance: 4: Min guard Ambulation Distance (Feet): 40 Feet Assistive device: Rolling walker Ambulation/Gait Assistance Details: Cues for safe use of RW. Pt positions walker too far ahead of her Gait Pattern: Step-through pattern;Trunk flexed Gait velocity: excessively slow.     Stairs: No    Exercises     PT Diagnosis:    PT Problem List:   PT Treatment Interventions:     PT Goals Acute Rehab PT Goals PT Goal Formulation: With patient Time For Goal Achievement: 09/01/11 Potential to Achieve Goals: Fair Pt will go Supine/Side to Sit: with supervision PT Goal: Supine/Side to Sit - Progress: Progressing toward goal Pt will go Sit to Supine/Side: with supervision Pt will go Sit to Stand: with supervision PT Goal: Sit to Stand - Progress: Progressing toward goal Pt will go Stand to Sit: with supervision PT Goal: Stand to Sit - Progress: Progressing toward goal Pt will Transfer Bed to Chair/Chair to Bed: with supervision PT Transfer Goal: Bed to Chair/Chair to Bed - Progress: Progressing toward goal Pt will Ambulate: 51 - 150 feet;with supervision;with least restrictive assistive device PT Goal: Ambulate - Progress: Progressing toward goal Pt will Go Up / Down Stairs: 3-5 stairs;with min assist;with rail(s) PT Goal: Up/Down Stairs - Progress: Not met Pt will Perform Home Exercise Program: Independently PT Goal: Perform Home Exercise Program - Progress: Not met  Visit Information  Last PT Received On: 08/28/11    Subjective Data      Cognition  Overall Cognitive Status: Appears within functional limits for tasks assessed/performed Arousal/Alertness: Awake/alert Orientation Level: Appears intact for tasks assessed Behavior During Session: Flat affect    Balance     End of Session PT - End of Session Equipment Utilized During Treatment: Gait belt;Oxygen Activity Tolerance: Patient limited by fatigue;Patient limited by pain;Other (comment) Patient left: in chair;with call bell/phone within reach Nurse Communication: Mobility status    Sinaya Minogue 08/28/2011, 3:25 PM Eniyah Eastmond L. Carlie Solorzano DPT [phone]

## 2011-08-28 NOTE — Progress Notes (Signed)
Bladder scan showed estimate 263 mls.  16 Fr foley catheter inserted. No complications noted during insertion. Redness noted on perineal area. Yellow urine with small amount of white sedements were noted.

## 2011-08-28 NOTE — Telephone Encounter (Signed)
I called the daughter back. Per the daughter, Natalie [name] is not comfortable with Dr. Alver Fisher partner.  [name] said Dr. Sung Amabile was going to talk to you about taking her.

## 2011-08-28 NOTE — Progress Notes (Signed)
[facility] [employer] does not contract with the [employer]. CSW contacted the daughter to inform her of this,  Sabino Niemann, MSW, [place] [phone]

## 2011-08-28 NOTE — Progress Notes (Signed)
ANTICOAGULATION CONSULT NOTE - Follow Up Consult  Pharmacy Consult for Coumadin Indication: atrial fibrillation  Allergies  Allergen Reactions  . Levofloxacin Other (See Comments)    REACTION: unspecified per [facility]    Patient Measurements: Height: 5\' 2"  (157.5 cm) Weight: 130 lb 3.2 oz (59.058 kg) (bed scale) IBW/kg (Calculated) : 50.1   Vital Signs: Temp: 98 F (36.7 C) (06/07 0608) Temp src: Oral (06/07 0608) BP: 127/82 mmHg (06/07 0608) Pulse Rate: 94  (06/07 0608)  Labs:  Basename 08/28/11 0715 08/27/11 0535 08/26/11 0500  HGB -- 12.0 --  HCT -- 37.3 --  PLT -- 243 --  APTT -- -- --  LABPROT 21.6* 15.8* 13.5  INR 1.84* 1.23 1.01  HEPARINUNFRC -- -- --  CREATININE -- 0.52 --  CKTOTAL -- -- --  CKMB -- -- --  TROPONINI -- -- --    Estimated Creatinine Clearance: 45.1 ml/min (by C-G formula based on Cr of 0.52).  Assessment: 78 yo F admitted in April for GIB. At that time Coumadin was stopped (was on for atrial fibrillation). Then went to rehab and readmitted 5/24 with acute metabolic encephalopathy. Now plan is to resume Coumadin with lower INR goal. Patient was on Coumadin 2.5 mg po daily at home in April per previous records, INR on 4/30 was 2.23 and then 2.77 5/1 (no Coumadin was given in hospital).   INR remains subtherapeutic but with significant increase today. Goal of Therapy:  INR 1.8-2.5 Monitor platelets by anticoagulation protocol: Yes   Plan:  Coumadin 1 mg po tonight INR daily  Corynn Solberg, Pharm.D. Clinical Pharmacist 08/28/2011 9:25 AM

## 2011-08-28 NOTE — Telephone Encounter (Signed)
[name] called to ask if Dr. Debby Bud will take her mother Natalie [name] as a new patient.  She has [facility] Medicare Complete.  She is in the hospital now.  When should we schedule her?

## 2011-08-28 NOTE — Progress Notes (Signed)
Name: Natalie [name] MRN: [id number] DOB: [birth year]    LOS: 14 Date of admit 08/14/2011 11:10 PM   PULMONARY / CRITICAL CARE MEDICINE  HPI:  78 y/o female former smoker lived at home until April 2013 (pre April 2013 condition appears to be general failure to thrive and slow decline in functional status for 6-12 months and on baseline vicodin and ativan for chronic pain nos and chronic anxiety nos- when she was admitted for GI bleed. Then to Rehab and then admitted on 08/14/2011 with acute metabolic encephalopathy from acute on chronic hypercapnic respiratory failure with VDRF.  [name], narcotic use.  She was in hospital until April 30 for GI bleed from gastric ulcer and in rehab due to deconditioning.  She was tx for PNA in rehab. PMHx HTN, Hypothyroidism, PUD, A fib, COPD, Anxiety, Arthritis, Lt renal mass, Trigeminal neuralgia    SUBJECTIVE/OVERNIGHT/INTERVAL HX Flat affect. Still with urinary retention. Abd pain improved. Appetite remains meager.    Vital Signs: BP 127/97  Pulse 100  Temp(Src) 98 F (36.7 C) (Oral)  Resp 18  Ht 5\' 2"  (1.575 m)  Wt 59.058 kg (130 lb 3.2 oz)  BMI 23.81 kg/m2  SpO2 99% 2 liters    Physical Exam: General - elderly female in NAD HEENT - mm pink/moist Cardiac - irregular, no murmur Chest - occasional rhonchi that improve w/ cough  Abd - soft, non-tender Ext - no edema Neuro - Awake, no distress.  MAE, follows commands.       ASSESSMENT AND PLAN  PULMONARY ETT 5/24>>5/26 CXR 6/4: improved infiltrates  Acute on chronic hypoxic, hypercapnic respiratory failure secondary to AECOPD in setting of narcotic/benzo use. Back to baseline  Plan: - Cont maintenance and PRN BDs - completed abx   CARDIOVASCULAR Echo 5/25>>EF 65 to 70%, mild MR, lipomatous hypertrophy of atrial septum Doppler legs 5/25>>no DVT  CAF >> w/ asymptomatic pauses.   Hx of HTN Acute Diastolic CHF 5/30 - 2 episodes of sinus  pauses after haldol started 5/29. QTc and troponin normal. Cards consult - considered it asymptomatic 6/2>  3.16 sec pause 6/1, pt asymptomatic, Dr Herbie Baltimore w/ [facility] is aware Plan: - Monitor heart rate on tele - [facility] Cards will consider pauses significant when > 3 sec - Resume warfarin 6/5 - Continue bisoprolol  - Cont ARB (ACE d/c'd 5/30)   GU/RENAL Hx of renal mass>>seen on CT abd/pelvis 07/21/11 ("1.8 x 1.9 cm enhancing left renal lesion is highly suspicious  for a solid neoplasm such as a renal cell carcinoma. Urology consultation is recommended.") Urinary retention - failed 2nd attempt @ Foley cath removal 6/7 Plan: - No eval of possible renal mass indicated @ this time - D/C Ipratropium bromide -Consider Urology eval next wk   HEMATOLOGIC  Lab 08/27/11 0535  HGB 12.0  HCT 37.3  WBC 11.3*  PLT 243   Anemia with recent hx of GI bleed.  No evidence for bleeding at this time. Tolerating warfarin  Plan: - Cont to monitor CBC intermittently - Pharm managing warfarin   GASTROINTESTINAL Gastric ulcer with upper GI bleed in April 2013. Plan: - PPI changed to daily from BID 6/4 - Cont to encourage - Tolerating reinstitution of warfarin without evidence of re-bleeding   INFECTIOUS Cultures: Blood 5/25>>neg Urine 5/25>>GNR - PSEUDOMONAS Sputum 5/25>>Reincubated on 5/27 >NEG No results found for this basename: PROCALCITON:5 in the last 168 hours Antibiotics: Vancomycin 05/25>>5/27 Zosyn 5/25>>5/28 Doxycycline 5/28>>5/29 Cefepime 5/29>>> 6/2  Possible sources of infection are HCAP or UTI - resolved Plan: - monitor off abx   NEUROLOGIC Chronic pain, anxiety - baseline vicodin use and ativan use for 8 years Acut post ICU Delirium -  5/29 - bad delirum - started haldol 5/30 delirium much improved to near resolved. Pain improved with norco and tramadol -> haldol changed to seroquel 5/31 - Delirium again. Family convinced patient depressed 6/3 - delirium  resolved 6/6: depression seemingly the overwhelming issue at this point.  6/7 D/C Seroquel Plan:  - Cont Cymbalta 30 mg daily - avoid benzo's - Cont PRN Ultram  ENDOCRINE Hx of hypothyroidism Plan: -Cont synthroid 75 mcg daily - TSH, free T4 ordered for AM 6/7 - results pending  GLOBAL Still working on DC planning   BEST PRACTICE / DISPOSITION - Level of Care:  Tele - Primary Service: PCCM  - Code Status:  Full code - Diet:  Heart healthy - DVT Px:  warfarin resumed 6/4 - GI Px:  Protonix - Consults: none - Family: daughter updated 6/4 - Dispo: home when sufficiently strong to go home with [facility]    Billy Fischer, MD;  PCCM service; [place] [phone]

## 2011-08-28 NOTE — Telephone Encounter (Signed)
i went by to see the patinet two days ago and she politely told me she saw Dr. Clelia Croft and was very happy with his care. She did seem to be mentally competent. Has the daughter spoken with her mother about continuing care with Dr. Clelia Croft.

## 2011-08-29 DIAGNOSIS — R339 Retention of urine, unspecified: Secondary | ICD-10-CM

## 2011-08-29 LAB — PROTIME-INR
INR: 1.9 — ABNORMAL HIGH (ref 0.00–1.49)
Prothrombin Time: 22.1 seconds — ABNORMAL HIGH (ref 11.6–15.2)

## 2011-08-29 MED ORDER — WARFARIN 0.5 MG HALF TABLET
1.5000 mg | ORAL_TABLET | Freq: Once | ORAL | Status: AC
  Administered 2011-08-29: 1.5 mg via ORAL
  Filled 2011-08-29: qty 1

## 2011-08-29 NOTE — Progress Notes (Signed)
ANTICOAGULATION CONSULT NOTE - Follow Up Consult  Pharmacy Consult for Coumadin Indication: atrial fibrillation  Allergies  Allergen Reactions  . Levofloxacin Other (See Comments)    REACTION: unspecified per [facility]    Patient Measurements: Height: 5\' 2"  (157.5 cm) Weight: 132 lb 3.2 oz (59.966 kg) (scale A) IBW/kg (Calculated) : 50.1   Vital Signs: Temp: 98.2 F (36.8 C) (06/08 0649) Temp src: Oral (06/08 0649) BP: 139/91 mmHg (06/08 0649) Pulse Rate: 79  (06/08 0649)  Labs:  Alvira Philips 08/29/11 0527 08/28/11 0715 08/27/11 0535  HGB -- -- 12.0  HCT -- -- 37.3  PLT -- -- 243  APTT -- -- --  LABPROT 22.1* 21.6* 15.8*  INR 1.90* 1.84* 1.23  HEPARINUNFRC -- -- --  CREATININE -- -- 0.52  CKTOTAL -- -- --  CKMB -- -- --  TROPONINI -- -- --    Estimated Creatinine Clearance: 45.1 ml/min (by C-G formula based on Cr of 0.52).  Assessment: 78 yo F admitted in April for GIB. At that time Coumadin was stopped (was on for atrial fibrillation). Then went to rehab and readmitted 5/24 with acute metabolic encephalopathy. Now plan is to resume Coumadin with lower INR goal. Patient was on Coumadin 2.5 mg po daily at home in April per previous records but lower doses in hospital d/t lower INR goal.  INR therapeutic today. No bleeding noted per documentation.   Goal of Therapy:  INR 1.8-2.5 Monitor platelets by anticoagulation protocol: Yes   Plan:  1. Coumadin 1.5 mg po tonight 2. INR daily  Thank you,  Brett Fairy, PharmD Pager: [phone]  08/29/2011 8:33 AM

## 2011-08-29 NOTE — Progress Notes (Signed)
Name: Natalie [name] MRN: [id number] DOB: Aug 02, 1930    LOS: 15 Date of admit 08/14/2011 11:10 PM   PULMONARY / CRITICAL CARE MEDICINE  HPI:  78 y/o female former smoker lived at home until April 2013 (pre April 2013 condition appears to be general failure to thrive and slow decline in functional status for 6-12 months and on baseline vicodin and ativan for chronic pain nos and chronic anxiety nos- when she was admitted for GI bleed. Then to Rehab and then admitted on 08/14/2011 with acute metabolic encephalopathy from acute on chronic hypercapnic respiratory failure with VDRF.  [name], narcotic use.  She was in hospital until April 30 for GI bleed from gastric ulcer and in rehab due to deconditioning.  She was tx for PNA in rehab. PMHx HTN, Hypothyroidism, PUD, A fib, COPD, Anxiety, Arthritis, Lt renal mass, Trigeminal neuralgia    SUBJECTIVE/OVERNIGHT/INTERVAL HX Calm but interactive and pleasant. Still with urinary retention. Staff reports foley had to be replaced yesterday. Low urine volumes.. Abd pain improved- denied this AM. Appetite remains meager.    Vital Signs: BP 139/91  Pulse 79  Temp(Src) 98.2 F (36.8 C) (Oral)  Resp 20  Ht 5\' 2"  (1.575 m)  Wt 132 lb 3.2 oz (59.966 kg)  BMI 24.18 kg/m2  SpO2 99% 2 liters    Physical Exam: General - elderly female in NAD HEENT - mm pink/moist Cardiac - irregular, no murmur Chest - Not hoarse, no rhonchi or cough anteriorly  Abd - soft, non-tender, BS heard Ext - no edema Neuro - Awake, no distress.  MAE, follows commands.     ASSESSMENT AND PLAN  PULMONARY ETT 5/24>>5/26 CXR 6/4: improved infiltrates  Acute on chronic hypoxic, hypercapnic respiratory failure secondary to AECOPD in setting of narcotic/benzo use. Back to baseline  Plan: - Cont maintenance and PRN BDs - completed abx   CARDIOVASCULAR Echo 5/25>>EF 65 to 70%, mild MR, lipomatous hypertrophy of atrial septum Doppler  legs 5/25>>no DVT  CAF >> w/ asymptomatic pauses.   Hx of HTN Acute Diastolic CHF 5/30 - 2 episodes of sinus pauses after haldol started 5/29. QTc and troponin normal. Cards consult - considered it asymptomatic 6/2>  3.16 sec pause 6/1, pt asymptomatic, Dr Herbie Baltimore w/ [facility] is aware Plan: - Monitor heart rate on tele - [facility] Cards will consider pauses significant when > 3 sec - Resume warfarin 6/5 - Continue bisoprolol  - Cont ARB (ACE d/c'd 5/30)   GU/RENAL Hx of renal mass>>seen on CT abd/pelvis 07/21/11 ("1.8 x 1.9 cm enhancing left renal lesion is highly suspicious  for a solid neoplasm such as a renal cell carcinoma. Urology consultation is recommended.") Urinary retention - failed 2nd attempt @ Foley cath removal 6/7 Nurse is reporting low volume output, but limited intake. Plan: - No eval of possible renal mass indicated @ this time - D/C Ipratropium bromide - *Consider Urology eval next wk - ordering BMET, encourage po fluids   HEMATOLOGIC  Lab 08/27/11 0535  HGB 12.0  HCT 37.3  WBC 11.3*  PLT 243   Anemia with recent hx of GI bleed.  No evidence for bleeding at this time. Tolerating warfarin  Plan: - Cont to monitor CBC intermittently - Pharm managing warfarin   GASTROINTESTINAL Gastric ulcer with upper GI bleed in April 2013. Plan: - PPI changed to daily from BID 6/4 - Cont to encourage - Tolerating reinstitution of warfarin without evidence of re-bleeding   INFECTIOUS  Cultures: Blood 5/25>>neg Urine 5/25>>GNR - PSEUDOMONAS Sputum 5/25>>Reincubated on 5/27 >NEG No results found for this basename: PROCALCITON:5 in the last 168 hours Antibiotics: Vancomycin 05/25>>5/27 Zosyn 5/25>>5/28 Doxycycline 5/28>>5/29 Cefepime 5/29>>> 6/2  Possible sources of infection are HCAP or UTI - resolved Plan: - monitor off abx   NEUROLOGIC Chronic pain, anxiety - baseline vicodin use and ativan use for 8 years Acut post ICU Delirium -  5/29 - bad delirum -  started haldol 5/30 delirium much improved to near resolved. Pain improved with norco and tramadol -> haldol changed to seroquel 5/31 - Delirium again. Family convinced patient depressed 6/3 - delirium resolved 6/6: depression seemingly the overwhelming issue at this point.  6/7 D/C Seroquel Plan:  - Cont Cymbalta 30 mg daily - avoid benzo's - Cont PRN Ultram  ENDOCRINE Hx of hypothyroidism Plan: -Cont synthroid 75 mcg daily - TSH, free T4   2.765/ 1.65  ok  GLOBAL Still working on DC planning   BEST PRACTICE / DISPOSITION - Level of Care:  Tele - Primary Service: PCCM  - Code Status:  Full code - Diet:  Heart healthy - DVT Px:  warfarin resumed 6/4 - GI Px:  Protonix - Consults: none - Family: daughter updated 6/4 - Dispo: home when sufficiently strong to go home with [facility]    CD Maple Hudson, MD, [place] [facility]

## 2011-08-29 NOTE — Telephone Encounter (Signed)
Dr. Sung Amabile did talk to me which is why I went by to see her in the hospital. There had been dissatisfaction with NH care. If the patient is happy seeing Dr. Clelia Croft I do not see a need for her to switch PCP's.

## 2011-08-30 LAB — BASIC METABOLIC PANEL
BUN: 9 mg/dL (ref 6–23)
Chloride: 94 mEq/L — ABNORMAL LOW (ref 96–112)
Creatinine, Ser: 0.38 mg/dL — ABNORMAL LOW (ref 0.50–1.10)
GFR calc Af Amer: >90 mL/min (ref >90)
GFR calc non Af Amer: >90 mL/min (ref >90)

## 2011-08-30 LAB — PROTIME-INR: Prothrombin Time: 24.8 seconds — ABNORMAL HIGH (ref 11.6–15.2)

## 2011-08-30 MED ORDER — LEVALBUTEROL HCL 0.63 MG/3ML IN NEBU
0.6300 mg | INHALATION_SOLUTION | Freq: Four times a day (QID) | RESPIRATORY_TRACT | Status: DC | PRN
  Filled 2011-08-30: qty 3

## 2011-08-30 MED ORDER — WARFARIN SODIUM 1 MG PO TABS
1.0000 mg | ORAL_TABLET | Freq: Once | ORAL | Status: AC
  Administered 2011-08-30: 1 mg via ORAL
  Filled 2011-08-30: qty 1

## 2011-08-30 NOTE — Progress Notes (Signed)
Name: Natalie [name] MRN: [id number] DOB: 17-Dec-1930    LOS: 16 Date of admit 08/14/2011 11:10 PM   PULMONARY / CRITICAL CARE MEDICINE  HPI:  78 y/o female former smoker lived at home until April 2013 (pre April 2013 condition appears to be general failure to thrive and slow decline in functional status for 6-12 months and on baseline vicodin and ativan for chronic pain nos and chronic anxiety nos- when she was admitted for GI bleed. Then to Rehab and then admitted on 08/14/2011 with acute metabolic encephalopathy from acute on chronic hypercapnic respiratory failure with VDRF.  [name], narcotic use.  She was in hospital until April 30 for GI bleed from gastric ulcer and in rehab due to deconditioning.  She was tx for PNA in rehab. PMHx HTN, Hypothyroidism, PUD, A fib, COPD, Anxiety, Arthritis, Lt renal mass, Trigeminal neuralgia    SUBJECTIVE/OVERNIGHT/INTERVAL HX Calm but interactive and pleasant.  Low urine volumes.. Abd pain improved- denied this AM. Appetite remains meager.  Discussed foley w/ nurse and will try again to remove it. Pt would like sleeping pill resumed.  Vital Signs: BP 139/77  Pulse 78  Temp(Src) 98 F (36.7 C) (Oral)  Resp 18  Ht 5\' 2"  (1.575 m)  Wt 59.7 kg (131 lb 9.8 oz)  BMI 24.07 kg/m2  SpO2 97% 2 liters   Physical Exam: General - elderly female in NAD. Pleasant  HEENT - mm pink/moist Cardiac - irregular, no murmur Chest - Not hoarse, diminished, equal sounds with quiet chest, unlabored Abd - soft, non-tender, BS heard Ext - no edema Neuro - Awake, no distress.  MAE, follows commands.     ASSESSMENT AND PLAN  PULMONARY ETT 5/24>>5/26 CXR 6/4: improved infiltrates  Acute on chronic hypoxic, hypercapnic respiratory failure secondary to AECOPD in setting of narcotic/benzo use. Back to baseline  Plan: - Cont maintenance and PRN BDs - completed abx   CARDIOVASCULAR Echo 5/25>>EF 65 to 70%, mild MR,  lipomatous hypertrophy of atrial septum Doppler legs 5/25>>no DVT  CAF >> w/ asymptomatic pauses.   Hx of HTN Acute Diastolic CHF 5/30 - 2 episodes of sinus pauses after haldol started 5/29. QTc and troponin normal. Cards consult - considered it asymptomatic 6/2>  3.16 sec pause 6/1, pt asymptomatic, Dr Herbie Baltimore w/ [facility] is aware Plan: - Monitor heart rate on tele - [facility] Cards will consider pauses significant when > 3 sec - Resume warfarin 6/5 - Continue bisoprolol  - Cont ARB (ACE d/c'd 5/30)   GU/RENAL Hx of renal mass>>seen on CT abd/pelvis 07/21/11 ("1.8 x 1.9 cm enhancing left renal lesion is highly suspicious  for a solid neoplasm such as a renal cell carcinoma. Urology consultation is recommended.") Urinary retention - failed 2nd attempt @ Foley cath removal 6/7 Nurse is reporting low volume output, but limited intake. Plan: - No eval of possible renal mass indicated @ this time - D/C Ipratropium bromide - *Consider Urology eval next wk - ordering BMET, encourage po fluids   HEMATOLOGIC  Lab 08/27/11 0535  HGB 12.0  HCT 37.3  WBC 11.3*  PLT 243   Anemia with recent hx of GI bleed.  No evidence for bleeding at this time. Tolerating warfarin  Plan: - Cont to monitor CBC intermittently - Pharm managing warfarin   GASTROINTESTINAL Gastric ulcer with upper GI bleed in April 2013. Plan: - PPI changed to daily from BID 6/4 - Cont to encourage - Tolerating reinstitution of warfarin  without evidence of re-bleeding   INFECTIOUS Cultures: Blood 5/25>>neg Urine 5/25>>GNR - PSEUDOMONAS Sputum 5/25>>Reincubated on 5/27 >NEG No results found for this basename: PROCALCITON:5 in the last 168 hours Antibiotics: Vancomycin 05/25>>5/27 Zosyn 5/25>>5/28 Doxycycline 5/28>>5/29 Cefepime 5/29>>> 6/2  Possible sources of infection are HCAP or UTI - resolved Plan: - monitor off abx   NEUROLOGIC Chronic pain, anxiety - baseline vicodin use and ativan use for 8  years Acut post ICU Delirium -  5/29 - bad delirum - started haldol 5/30 delirium much improved to near resolved. Pain improved with norco and tramadol -> haldol changed to seroquel 5/31 - Delirium again. Family convinced patient depressed 6/3 - delirium resolved 6/6: depression seemingly the overwhelming issue at this point.  6/7 D/C Seroquel Plan:  - Cont Cymbalta 30 mg daily - avoid benzo's..... I stalled on resumption of sleep med - Cont PRN Ultram  ENDOCRINE Hx of hypothyroidism Plan: -Cont synthroid 75 mcg daily - TSH, free T4   2.765/ 1.65  ok  GLOBAL Still working on DC planning. Discussed foley with nursing and will remove. Continue mobilization.  BEST PRACTICE / DISPOSITION - Level of Care:  Tele - Primary Service: PCCM  - Code Status:  Full code - Diet:  Heart healthy - DVT Px:  warfarin resumed 6/4 - GI Px:  Protonix - Consults: none - Family: daughter updated 6/4 - Dispo: home when sufficiently strong to go home with [facility]    CD Maple Hudson, MD, [place] [facility]

## 2011-08-30 NOTE — Progress Notes (Signed)
ANTICOAGULATION CONSULT NOTE - Follow Up Consult  Pharmacy Consult for Coumadin Indication: atrial fibrillation  Allergies  Allergen Reactions  . Levofloxacin Other (See Comments)    REACTION: unspecified per [facility]    Patient Measurements: Height: 5\' 2"  (157.5 cm) Weight: 131 lb 9.8 oz (59.7 kg) IBW/kg (Calculated) : 50.1   Vital Signs: Temp: 98 F (36.7 C) (06/09 0538) Temp src: Oral (06/09 0538) BP: 139/77 mmHg (06/09 0538) Pulse Rate: 78  (06/09 0538)  Labs:  Natalie [name] 08/30/11 0650 08/29/11 0527 08/28/11 0715  HGB -- -- --  HCT -- -- --  PLT -- -- --  APTT -- -- --  LABPROT 24.8* 22.1* 21.6*  INR 2.20* 1.90* 1.84*  HEPARINUNFRC -- -- --  CREATININE 0.38* -- --  CKTOTAL -- -- --  CKMB -- -- --  TROPONINI -- -- --    Estimated Creatinine Clearance: 45.1 ml/min (by C-G formula based on Cr of 0.38).  Assessment: 78 yo F admitted in April for GIB. At that time Coumadin was stopped (was on for atrial fibrillation). Then went to rehab and readmitted 5/24 with acute metabolic encephalopathy. Now plan is to resume Coumadin with lower INR goal. Patient was on Coumadin 2.5 mg po daily at home in April per previous records but lower doses in hospital d/t lower INR goal.  INR therapeutic today. No bleeding noted per documentation.   Goal of Therapy:  INR 1.8-2.5 Monitor platelets by anticoagulation protocol: Yes   Plan:  1. Coumadin 1 mg po tonight 2. INR daily  Thank you,  Brett Fairy, PharmD Pager: [phone]  08/30/2011 9:41 AM

## 2011-08-30 NOTE — Progress Notes (Signed)
Pt assessed and made PRN Xopenex.63 per protocol. Pt has refused treatments for the last three days and hasn't had any signs of wheezing or SOB. Made PRN to save on cost while still being able to treat the patient if needed. Pt has no home regimen documented at this time. But has a history of COPD and Smoking. NO current x ray within the last 5 days

## 2011-08-31 ENCOUNTER — Telehealth: Payer: Self-pay | Admitting: Pulmonary Disease

## 2011-08-31 DIAGNOSIS — J449 Chronic obstructive pulmonary disease, unspecified: Secondary | ICD-10-CM | POA: Diagnosis present

## 2011-08-31 LAB — URINALYSIS, ROUTINE W REFLEX MICROSCOPIC
Glucose, UA: NEGATIVE mg/dL
Ketones, ur: NEGATIVE mg/dL
Protein, ur: NEGATIVE mg/dL

## 2011-08-31 LAB — CBC
MCH: 28.7 pg (ref 26.0–34.0)
MCV: 87.5 fL (ref 78.0–100.0)
Platelets: 251 10*3/uL (ref 150–400)
RBC: 4.07 MIL/uL (ref 3.87–5.11)

## 2011-08-31 LAB — BASIC METABOLIC PANEL
CO2: 37 mEq/L — ABNORMAL HIGH (ref 19–32)
Calcium: 8.9 mg/dL (ref 8.4–10.5)
Creatinine, Ser: 0.42 mg/dL — ABNORMAL LOW (ref 0.50–1.10)

## 2011-08-31 LAB — URINE MICROSCOPIC-ADD ON

## 2011-08-31 MED ORDER — WARFARIN SODIUM 1 MG PO TABS
1.0000 mg | ORAL_TABLET | Freq: Once | ORAL | Status: AC
  Administered 2011-08-31: 1 mg via ORAL
  Filled 2011-08-31: qty 1

## 2011-08-31 NOTE — Progress Notes (Signed)
ANTICOAGULATION CONSULT NOTE - Follow Up Consult  Pharmacy Consult for Coumadin Indication: atrial fibrillation  Allergies  Allergen Reactions  . Levofloxacin Other (See Comments)    REACTION: unspecified per [facility]    Patient Measurements: Height: 5\' 2"  (157.5 cm) Weight: 132 lb 0.9 oz (59.9 kg) IBW/kg (Calculated) : 50.1   Vital Signs: Temp: 97.9 F (36.6 C) (06/10 0644) Temp src: Oral (06/10 0644) BP: 139/72 mmHg (06/10 0644) Pulse Rate: 78  (06/10 0644)  Labs:  Basename 08/31/11 0427 08/30/11 0650 08/29/11 0527  HGB 11.7* -- --  HCT 35.6* -- --  PLT 251 -- --  APTT -- -- --  LABPROT 24.9* 24.8* 22.1*  INR 2.21* 2.20* 1.90*  HEPARINUNFRC -- -- --  CREATININE 0.42* 0.38* --  CKTOTAL -- -- --  CKMB -- -- --  TROPONINI -- -- --    Estimated Creatinine Clearance: 45.1 ml/min (by C-G formula based on Cr of 0.42).  Assessment: 78 yo F admitted in April for GIB. At that time Coumadin was stopped (was on for atrial fibrillation). Then went to rehab and readmitted 5/24 with acute metabolic encephalopathy. Now plan is to resume Coumadin with lower INR goal. Patient was on Coumadin 2.5 mg po daily at home in April per previous records but lower doses in hospital d/t lower INR goal.  INR therapeutic today. No bleeding noted per documentation.   Goal of Therapy:  INR 1.8-2.5 Monitor platelets by anticoagulation protocol: Yes   Plan:  1. Coumadin 1 mg po tonight 2. INR daily  Thank you,  Christoper Fabian, PharmD, BCPS Clinical pharmacist, pager [phone] 08/31/2011 9:26 AM

## 2011-08-31 NOTE — Telephone Encounter (Signed)
NOTE: I ASKED CALLER TO CHECK WITH NURSE AT [facility] BUT SHE INSISTS THAT A NURSE FROM THIS OFFICE CALL SINCE SHE SAYS SHE SPOKE TO A NURSE IN OUR OFFICE LAST WEEK RE: HER MOM SINCE OUR DR'S HAVE BEEN TREATING PT. Natalie [name]

## 2011-08-31 NOTE — Progress Notes (Signed)
Name: PRERNA HAROLD MRN: [id number] DOB: May 12, 1930    LOS: 17 Date of admit 08/14/2011 11:10 PM   PULMONARY / CRITICAL CARE MEDICINE  HPI:  78 y/o female former smoker lived at home until April 2013 (pre April 2013 condition appears to be general failure to thrive and slow decline in functional status for 6-12 months and on baseline vicodin and ativan for chronic pain nos and chronic anxiety nos- when she was admitted for GI bleed. Then to Rehab and then admitted on 08/14/2011 with acute metabolic encephalopathy from acute on chronic hypercapnic respiratory failure with VDRF.  [name], narcotic use.  She was in hospital until April 30 for GI bleed from gastric ulcer and in rehab due to deconditioning.  She was tx for PNA in rehab. PMHx HTN, Hypothyroidism, PUD, A fib, COPD, Anxiety, Arthritis, Lt renal mass, Trigeminal neuralgia    SUBJECTIVE/OVERNIGHT/INTERVAL HX Still having difficulty passing urine.  Still has increased residual urine volume with bladder scanning.  Pt reports that she has been seen as outpt by Dr. Isabel Caprice.  She is very clear that she does not want to f/u with Dr. Clelia Croft as an outpt.  Vital Signs: BP 139/72  Pulse 78  Temp(Src) 97.9 F (36.6 C) (Oral)  Resp 20  Ht 5\' 2"  (1.575 m)  Wt 132 lb 0.9 oz (59.9 kg)  BMI 24.15 kg/m2  SpO2 97% 2 liters   Intake/Output Summary (Last 24 hours) at 08/31/11 1040 Last data filed at 08/30/11 1900  Gross per 24 hour  Intake    240 ml  Output   1301 ml  Net  -1061 ml    Physical Exam: General - elderly female in NAD HEENT - no sinus tenderness Cardiac - irregular, no murmur Chest - diminished breath sounds, no wheeze Abd - soft, non-tender, BS + Ext - no edema Neuro - Awake, anxious at times, follows commands  Lab Results  Component Value Date   WBC 6.7 08/31/2011   HGB 11.7* 08/31/2011   HCT 35.6* 08/31/2011   MCV 87.5 08/31/2011   PLT 251 08/31/2011   Lab Results  Component Value  Date   CREATININE 0.42* 08/31/2011   BUN 5* 08/31/2011   NA 133* 08/31/2011   K 3.0* 08/31/2011   CL 89* 08/31/2011   CO2 37* 08/31/2011   Lab Results  Component Value Date   ALT 38* 08/21/2011   AST 21 08/21/2011   ALKPHOS 38* 08/21/2011   BILITOT 0.5 08/21/2011     ASSESSMENT AND PLAN  PULMONARY ETT 5/24>>5/26   Acute on chronic hypoxic, hypercapnic respiratory failure secondary to AECOPD in setting of narcotic/benzo use.  Back to baseline.  Plan: - Continue maintenance and PRN BDs  CARDIOVASCULAR Echo 5/25>>EF 65 to 70%, mild MR, lipomatous hypertrophy of atrial septum Doppler legs 5/25>>no DVT  CAF >> w/ asymptomatic pauses.   Hx of HTN Acute Diastolic CHF 5/30 - 2 episodes of sinus pauses after haldol started 5/29. QTc and troponin normal. Cards consult - considered it asymptomatic 6/2>  3.16 sec pause 6/1, pt asymptomatic, Dr Herbie Baltimore w/ [facility] is aware Plan: - Monitor heart rate on tele - [facility] Cards will consider pauses significant when > 3 sec - Resumed warfarin 6/5 - Continue bisoprolol  - Continue ARB (ACE d/c'd 5/30)   GU/RENAL Hx of renal mass>>seen on CT abd/pelvis 07/21/11 ("1.8 x 1.9 cm enhancing left renal lesion is highly suspicious  for a solid neoplasm such as  a renal cell carcinoma. Urology consultation is recommended.") Urinary retention - failed 2nd attempt @ Foley cath removal 6/7, and she continues to have difficulty with urine retention 6/10. Plan: - Pt reports she is followed by Dr. Isabel Caprice as outpt>>have placed consult 6/10 - D/C'ed Ipratropium bromide - f/u BMET  HEMATOLOGIC  Lab 08/31/11 0427 08/27/11 0535  HGB 11.7* 12.0  HCT 35.6* 37.3  WBC 6.7 11.3*  PLT 251 243   Anemia with recent hx of GI bleed.  No evidence for bleeding at this time. Tolerating warfarin  Plan: - Continue to monitor CBC intermittently - Pharmacy managing warfarin  GASTROINTESTINAL Gastric ulcer with upper GI bleed in April 2013. Plan: - PPI changed to daily  from BID 6/4 - Tolerating reinstitution of warfarin without evidence of re-bleeding  Failure to thrive. Plan: - Continue to encourage po intake  INFECTIOUS Cultures: Blood 5/25>>neg Urine 5/25>>GNR - PSEUDOMONAS Sputum 5/25>>Reincubated on 5/27 >NEG  Antibiotics: Vancomycin 05/25>>5/27 Zosyn 5/25>>5/28 Doxycycline 5/28>>5/29 Cefepime 5/29>>> 6/2  Possible sources of infection are HCAP or UTI - resolved Plan: - monitor off abx  NEUROLOGIC Chronic pain, anxiety - baseline vicodin use and ativan use for 8 years Acut post ICU Delirium - Resolved 5/29 - bad delirum - started haldol 5/30 delirium much improved to near resolved. Pain improved with norco and tramadol -> haldol changed to seroquel 5/31 - Delirium again. Family convinced patient depressed 6/3 - delirium resolved 6/6: depression seemingly the overwhelming issue at this point.  6/7 D/C Seroquel Plan:  - Continue Cymbalta 30 mg daily - avoid benzo's/narcotics - Continue PRN Ultram  ENDOCRINE Hx of hypothyroidism Plan: -Cont synthroid 75 mcg daily - TSH, free T4   2.765/ 1.65  ok  GLOBAL Needs further assessment by urology.  Will also need to clarify anxiolytic/pain regimen further.  Will need to determine if she can go home vs NH placement.  She does not want to follow up with Dr. Clelia Croft as outpt, and wants to switch to Dr. Debby Bud as PCP.  She has not been seen by Dr. Debby Bud yet.    Have asked hospitalist to assume care from 6/11 and PCCM will sign off.   Coralyn Helling, MD [facility] Pulmonary/Critical Care 08/31/2011, 10:34 AM Pager:  [phone] After 3pm call: [phone]

## 2011-08-31 NOTE — Progress Notes (Signed)
Pt noted complaining of not urinating at 2100. Pt noted having stool at that time, incontinent episode and unsure if urine was passed. Bladder scan performed and noted 692. In and out cath performed and 1300cc of urine.

## 2011-08-31 NOTE — Progress Notes (Signed)
0830 Patient refuses to in and out cath. MD notified.  1017 Bladder scan was performed.   1100 Patient complained of bladder feeling full and in need of relief. MD notified.  1130 Patient refused in and out cath. Notified Urologist.  1345 Foley catheter was placed. Patient tolerated it well. 400cc of urine was drained in bag.

## 2011-08-31 NOTE — Telephone Encounter (Signed)
Spoke with pt's daughter to let her know that if pt is currently in the hospital, any questions she has needs to be directed to the nurse caring for her and they should be able to contact the physician or nurse practitioner that is rounding in the hospital for any questions. Pt's daughter verbalized understanding.

## 2011-08-31 NOTE — Progress Notes (Signed)
PT Cancellation Note  Treatment cancelled today due to patient's refusal to participate.  Attempted to see pt 4 times.  Pt c/o pain being unbearable and unable to participate each time despite being medicated.    Maansi Wike 08/31/2011, 6:00 PM Siria Calandro L. Tyray Proch DPT [phone]

## 2011-09-01 MED ORDER — ALPRAZOLAM 0.25 MG PO TABS: 0.2500 mg | ORAL_TABLET | Freq: Three times a day (TID) | ORAL | Status: AC | PRN

## 2011-09-01 MED ORDER — TRAMADOL HCL 50 MG PO TABS: 50.0000 mg | ORAL_TABLET | Freq: Four times a day (QID) | ORAL | Status: AC | PRN

## 2011-09-01 MED ORDER — DULOXETINE HCL 30 MG PO CPEP: 30.0000 mg | ORAL_CAPSULE | Freq: Every day | ORAL | Status: DC

## 2011-09-01 MED ORDER — LEVALBUTEROL HCL 0.63 MG/3ML IN NEBU: 0.6300 mg | INHALATION_SOLUTION | Freq: Four times a day (QID) | RESPIRATORY_TRACT | Status: DC | PRN

## 2011-09-01 MED ORDER — HYDROCODONE-ACETAMINOPHEN 5-325 MG PO TABS: 1.0000 | ORAL_TABLET | Freq: Four times a day (QID) | ORAL | Status: AC | PRN

## 2011-09-01 MED ORDER — WARFARIN 0.5 MG HALF TABLET: 0.5000 mg | ORAL_TABLET | Freq: Once | ORAL | Status: DC

## 2011-09-01 MED ORDER — TRAMADOL HCL 50 MG PO TABS: 50.0000 mg | ORAL_TABLET | Freq: Four times a day (QID) | ORAL | Status: DC | PRN

## 2011-09-01 MED ORDER — HYDROCODONE-ACETAMINOPHEN 5-325 MG PO TABS: 1.0000 | ORAL_TABLET | Freq: Four times a day (QID) | ORAL | Status: DC | PRN

## 2011-09-01 MED ORDER — ALPRAZOLAM 0.25 MG PO TABS: 0.2500 mg | ORAL_TABLET | Freq: Three times a day (TID) | ORAL | Status: DC | PRN

## 2011-09-01 MED ORDER — HYDRALAZINE HCL 20 MG/ML IJ SOLN: 5.0000 mg | Freq: Four times a day (QID) | INTRAMUSCULAR | Status: DC | PRN

## 2011-09-01 MED ORDER — BISOPROLOL FUMARATE 5 MG PO TABS: 5.0000 mg | ORAL_TABLET | Freq: Every day | ORAL | Status: DC

## 2011-09-01 MED ORDER — TAMSULOSIN HCL 0.4 MG PO CAPS: 0.4000 mg | ORAL_CAPSULE | Freq: Every day | ORAL | Status: DC

## 2011-09-01 MED ORDER — TAMSULOSIN HCL 0.4 MG PO CAPS
0.4000 mg | ORAL_CAPSULE | Freq: Every day | ORAL | Status: DC
  Administered 2011-09-01: 0.4 mg via ORAL
  Filled 2011-09-01 (×2): qty 1

## 2011-09-01 MED ORDER — LOSARTAN POTASSIUM 25 MG PO TABS: 25.0000 mg | ORAL_TABLET | Freq: Every day | ORAL | Status: DC

## 2011-09-01 MED ORDER — WARFARIN 0.5 MG HALF TABLET
0.5000 mg | ORAL_TABLET | Freq: Once | ORAL | Status: DC
  Filled 2011-09-01: qty 1

## 2011-09-01 NOTE — Progress Notes (Signed)
Patient's daughter called CSW back and now wants to accept the bed offer at [facility]. CSW will assist with all d/c needs. Sabino Niemann, MSW, [employer] [phone]

## 2011-09-01 NOTE — Discharge Instructions (Signed)
Do Not Remove Foley Catheter. She has urinary retention. This is being followed and treated by Urology. Dr Isabel Caprice. She is to keep the foley in place until otherwise instructed by Urology.

## 2011-09-01 NOTE — Progress Notes (Signed)
CSW spoke with patient's daughter, Gunnar Fusi on the phone. Patient's daughter expressed that they were now interested in SNF placement.  CSW contacted [facility], [facility] [facility], and [facility] to see about bed availability.  After sharing the availability with the patient's daughter the daughter  Reported that she plans to seek placement outside of [place]. Clinical Social Worker will sign off for now as social work intervention is no longer needed. Please consult [place] again if new need arises.   Sabino Niemann, MSW, [employer] [phone]

## 2011-09-01 NOTE — Progress Notes (Signed)
ANTICOAGULATION CONSULT NOTE - Follow Up Consult  Pharmacy Consult for Coumadin Indication: atrial fibrillation  Allergies  Allergen Reactions  . Levofloxacin Other (See Comments)    REACTION: unspecified per [facility]    Patient Measurements: Height: 5\' 2"  (157.5 cm) Weight: 130 lb 11.7 oz (59.3 kg) (scale a) IBW/kg (Calculated) : 50.1   Vital Signs: Temp: 97.6 F (36.4 C) (06/11 0500) Temp src: Oral (06/11 0500) BP: 143/96 mmHg (06/11 1001) Pulse Rate: 96  (06/11 1001)  Labs:  Basename 09/01/11 0500 08/31/11 0427 08/30/11 0650  HGB -- 11.7* --  HCT -- 35.6* --  PLT -- 251 --  APTT -- -- --  LABPROT 27.4* 24.9* 24.8*  INR 2.50* 2.21* 2.20*  HEPARINUNFRC -- -- --  CREATININE -- 0.42* 0.38*  CKTOTAL -- -- --  CKMB -- -- --  TROPONINI -- -- --    Estimated Creatinine Clearance: 45.1 ml/min (by C-G formula based on Cr of 0.42).  Assessment: 78 yo F admitted in April for GIB. At that time Coumadin was stopped (was on for atrial fibrillation). Then went to rehab and readmitted 5/24 with acute metabolic encephalopathy.  INR therapeutic today - upper end of lower goal range. No bleeding noted per documentation.   Goal of Therapy:  INR 1.8-2.5 Monitor platelets by anticoagulation protocol: Yes   Plan:  1. Coumadin 0.5 mg po tonight 2. INR daily  Thank you,  Christoper Fabian, PharmD, BCPS Clinical pharmacist, pager [phone] 09/01/2011 10:07 AM

## 2011-09-01 NOTE — Progress Notes (Signed)
DC IV, DC Tele, DC to SNF. Discharge instructions and home medications discussed with patient. Patient denied any questions or concerns at that time. Patient leaving unit via ambulance x2 EMS and appears in no acute distress.

## 2011-09-01 NOTE — Consult Note (Signed)
Urology Consult  Referring physician: Craige Cotta Reason for referral: Urinary retention and renal mass.  History of Present Illness: Patient is currently 78 years of age. She has been seen in our office in the past primarily with recurrent cystitis but also some voiding dysfunction with urgency and some urge incontinence. The patient was subsequently assessed by Dr. Patsi Sears in our practice approximately a month ago due to an incidentally noted less than 2 cm mass in the lower pole of the left kidney. The patient is now been admitted with an acute metabolic encephalopathy. Notes of her admission and recent hospitalization reviewed. The patient is had some ongoing issues with regard to voiding. She has had an elevated residual and has had a Foley catheter in and out. She most recently failed another attempt at voiding and has an indwelling Foley catheter. The patient reports that she does not want to leave the hospital with the catheter in place. She complains of significant fatigue and overall weakness. She denies any constipation issues. She cannot recall if she was having increased difficulty voiding prior to her recent hospitalization.  Past Medical History  Diagnosis Date  . Osteoporosis   . Hypertension   . Hypothyroidism   . Lower GI bleeding 07/21/11  . Atrial fibrillation   . COPD (chronic obstructive pulmonary disease)   . Pneumonia   . Shortness of breath 07/21/11    "anytime"  . Sinus headache 07/21/11    "all the time"  . Bladder infection, chronic   . Arthritis     "everywhere"  . Anxiety   . Chronic lower back pain   . Chronic abdominal pain   . Renal mass, left 2013  . PUD (peptic ulcer disease)   . Chronic cystitis   . Trigeminal neuralgia     /E-chart  . Chronic kidney disease    Past Surgical History  Procedure Date  . Vaginal hysterectomy     partial  . Cataract extraction w/ intraocular lens  implant, bilateral   . Percutaneous pinning femoral neck fracture 11/2003      w/closed reduction ; left/E-chart  . Laceration repair 11/2003    left eyebrow  . Bilateral salpingoophorectomy 12/2005    lap/E-chart  . Cholecystectomy 1960  . Appendectomy     presumed/E-chart  . Esophagogastroduodenoscopy 07/23/2011    Procedure: ESOPHAGOGASTRODUODENOSCOPY (EGD);  Surgeon: Graylin Shiver, MD;  Location: [facility] ENDOSCOPY;  Service: Endoscopy;  Laterality: N/A;    Medications:  Scheduled:   . bisoprolol  5 mg Oral Daily  . chlorhexidine  15 mL Mouth/Throat BID  . DULoxetine  30 mg Oral Daily  . levothyroxine  75 mcg Oral QAC breakfast  . losartan  25 mg Oral Daily  . pantoprazole  40 mg Oral Q1200  . warfarin  1 mg Oral ONCE-1800  . Warfarin - Pharmacist Dosing Inpatient   Does not apply q1800    Allergies:  Allergies  Allergen Reactions  . Levofloxacin Other (See Comments)    REACTION: unspecified per [facility]    History reviewed. No pertinent family history.  Social History:  reports that she has quit smoking. Her smoking use included Cigarettes. She has never used smokeless tobacco. She reports that she drinks alcohol. She reports that she does not use illicit drugs.  Fairly globally positive. The patient complains of generalized fatigue and lethargy along with diffuse body aches, back pain, neck pain, chest pain, difficulty ambulating.  Physical Exam:  Vital signs in last 24 hours: Temp:  [97.6 F (  36.4 C)-97.8 F (36.6 C)] 97.6 F (36.4 C) (06/11 0500) Pulse Rate:  [70-103] 97  (06/11 0500) Resp:  [20] 20  (06/11 0500) BP: (142-152)/(80-92) 152/80 mmHg (06/11 0500) SpO2:  [97 %-99 %] 97 % (06/11 0500) Weight:  [59.3 kg (130 lb 11.7 oz)] 59.3 kg (130 lb 11.7 oz) (06/11 0500)  Constitutional: Vital signs reviewed. WD WN in NAD Head: Normocephalic and atraumatic   Eyes: PERRL, No scleral icterus.  Neck: Supple No  Gross JVD, mass, thyromegaly, or carotid bruit present.  Cardiovascular: RRR Pulmonary/Chest: Normal effort Abdominal: Soft. Non-tender,  non-distended. Genitourinary: Not examined Extremities: No cyanosis or edema  Neurological: Grossly non-focal.  Skin: Warm,very dry and intact. No rash, cyanosis   Laboratory Data:  Results for orders placed during the hospital encounter of 08/14/11 (from the past 72 hour(s))  PROTIME-INR     Status: Abnormal   Collection Time   08/30/11  6:50 AM      Component Value Range Comment   Prothrombin Time 24.8 (*) 11.6 - 15.2 (seconds)    INR 2.20 (*) 0.00 - 1.49    BASIC METABOLIC PANEL     Status: Abnormal   Collection Time   08/30/11  6:50 AM      Component Value Range Comment   Sodium 135  135 - 145 (mEq/L)    Potassium 3.2 (*) 3.5 - 5.1 (mEq/L)    Chloride 94 (*) 96 - 112 (mEq/L)    CO2 35 (*) 19 - 32 (mEq/L)    Glucose, Bld 94  70 - 99 (mg/dL)    BUN 9  6 - 23 (mg/dL)    Creatinine, Ser 2.95 (*) 0.50 - 1.10 (mg/dL)    Calcium 8.7  8.4 - 10.5 (mg/dL)    GFR calc non Af Amer >90  >90 (mL/min)    GFR calc Af Amer >90  >90 (mL/min)   PROTIME-INR     Status: Abnormal   Collection Time   08/31/11  4:27 AM      Component Value Range Comment   Prothrombin Time 24.9 (*) 11.6 - 15.2 (seconds)    INR 2.21 (*) 0.00 - 1.49    CBC     Status: Abnormal   Collection Time   08/31/11  4:27 AM      Component Value Range Comment   WBC 6.7  4.0 - 10.5 (K/uL)    RBC 4.07  3.87 - 5.11 (MIL/uL)    Hemoglobin 11.7 (*) 12.0 - 15.0 (g/dL)    HCT 62.1 (*) 30.8 - 46.0 (%)    MCV 87.5  78.0 - 100.0 (fL)    MCH 28.7  26.0 - 34.0 (pg)    MCHC 32.9  30.0 - 36.0 (g/dL)    RDW 65.7  84.6 - 96.2 (%)    Platelets 251  150 - 400 (K/uL)   BASIC METABOLIC PANEL     Status: Abnormal   Collection Time   08/31/11  4:27 AM      Component Value Range Comment   Sodium 133 (*) 135 - 145 (mEq/L)    Potassium 3.0 (*) 3.5 - 5.1 (mEq/L)    Chloride 89 (*) 96 - 112 (mEq/L)    CO2 37 (*) 19 - 32 (mEq/L)    Glucose, Bld 90  70 - 99 (mg/dL)    BUN 5 (*) 6 - 23 (mg/dL)    Creatinine, Ser 9.52 (*) 0.50 - 1.10 (mg/dL)     Calcium 8.9  8.4 - 10.5 (mg/dL)  GFR calc non Af Amer >90  >90 (mL/min)    GFR calc Af Amer >90  >90 (mL/min)   URINALYSIS, ROUTINE W REFLEX MICROSCOPIC     Status: Abnormal   Collection Time   08/31/11  4:59 PM      Component Value Range Comment   Color, Urine YELLOW  YELLOW     APPearance HAZY (*) CLEAR     Specific Gravity, Urine 1.012  1.005 - 1.030     pH 7.0  5.0 - 8.0     Glucose, UA NEGATIVE  NEGATIVE (mg/dL)    Hgb urine dipstick SMALL (*) NEGATIVE     Bilirubin Urine NEGATIVE  NEGATIVE     Ketones, ur NEGATIVE  NEGATIVE (mg/dL)    Protein, ur NEGATIVE  NEGATIVE (mg/dL)    Urobilinogen, UA 1.0  0.0 - 1.0 (mg/dL)    Nitrite POSITIVE (*) NEGATIVE     Leukocytes, UA LARGE (*) NEGATIVE    URINE MICROSCOPIC-ADD ON     Status: Abnormal   Collection Time   08/31/11  4:59 PM      Component Value Range Comment   Squamous Epithelial / LPF RARE  RARE     WBC, UA 21-50  <3 (WBC/hpf)    RBC / HPF 0-2  <3 (RBC/hpf)    Bacteria, UA MANY (*) RARE    PROTIME-INR     Status: Abnormal   Collection Time   09/01/11  5:00 AM      Component Value Range Comment   Prothrombin Time 27.4 (*) 11.6 - 15.2 (seconds)    INR 2.50 (*) 0.00 - 1.49     No results found for this or any previous visit (from the past 240 hour(s)). Creatinine:  Basename 08/31/11 0427 08/30/11 0650 08/27/11 0535  CREATININE 0.42* 0.38* 0.52   Baseline Creatinine:   Impression/Assessment:  #1 urinary retention #2 incidental small renal mass  (probable small renal cell carcinoma)  Plan:  Ms. Bartram clearly how supporting dysfunction with incomplete bladder emptying/urinary retention. This is probably multifactorial and is unlikely to resolve in the next 2 days. We can initiate an alpha blocker to help her relax her bladder neck and to improve bladder contractility. Ultimately if she has ongoing issues with urinary retention and/or incomplete bladder emptying she will need urodynamics and further assessment. There is  really no indication for continued acute hospitalization just due to inability to void inserted from a urologic perspective she can be discharged home with a Foley catheter with plans on initiating a voiding trial and additional workup as an outpatient. The patient has a small renal mass that is suspicious for renal cell carcinoma. It is less than 2 cm in size and she is close to age 59. I would recommend at this time serial monitoring and if there is any interim growth biopsy by interventional radiology with probable ablative procedure. Imajean Mcdermid S 09/01/2011, 8:05 AM

## 2011-09-01 NOTE — Progress Notes (Signed)
Clinical social worker assisted with patient discharge to skilled nursing facility [facility]. CSW addressed all family questions and concerns. CSW copied chart and added all important documents. CSW also set up patient transportation with [employer]. Clinical Social Worker will sign off for now as social work intervention is no longer needed.   Sabino Niemann, MSW, [employer] [phone]

## 2011-09-01 NOTE — Progress Notes (Signed)
Physical Therapy Treatment Patient Details Name: Natalie [name] MRN: [id number] DOB: 08-Apr-1930 Today's Date: 09/01/2011 Time: 8295-6213 PT Time Calculation (min): 26 min  PT Assessment / Plan / Recommendation Comments on Treatment Session  Pt agreeable to ambulation today.  Limited distance ambulation though.  Encouraged pt to continue to sit in chair until after lunch.    Follow Up Recommendations  Skilled nursing facility;Supervision/Assistance - 24 hour    Barriers to Discharge        Equipment Recommendations  Defer to next venue    Recommendations for Other Services    Frequency Min 2X/week   Plan Discharge plan remains appropriate;Frequency remains appropriate    Precautions / Restrictions Precautions Precautions: Fall Restrictions Weight Bearing Restrictions: No   Pertinent Vitals/Pain Premedicated prior to treatment-c/o back pain but did not rate.    Mobility  Transfers Transfers: Sit to Stand;Stand to Sit Sit to Stand: 3: Mod assist;With upper extremity assist;With armrests;From chair/3-in-1 Stand to Sit: 4: Min assist;With upper extremity assist;To chair/3-in-1 Details for Transfer Assistance: Pt needs assist to maintain balance with transition and to traslate trunk anterior over BOS.  LOB posteriorly.  Cues for hand placement and technique.  Cues to scoot to edge of chair. Ambulation/Gait Ambulation/Gait Assistance: 4: Min assist Ambulation Distance (Feet): 35 Feet Assistive device: Rolling walker Ambulation/Gait Assistance Details: Assist to balance and guide RW.  Cues for safety and sequencing. Gait Pattern: Step-through pattern;Decreased step length - right;Decreased step length - left;Trunk flexed Gait velocity: slow Stairs: No    :     PT Goals Acute Rehab PT Goals Time For Goal Achievement: 09/01/11 Potential to Achieve Goals: Fair PT Goal: Sit to Stand - Progress: Progressing toward goal PT Goal: Stand to Sit - Progress: Progressing toward  goal PT Goal: Ambulate - Progress: Progressing toward goal  Visit Information  Last PT Received On: 09/01/11 Assistance Needed: +1    Subjective Data  Subjective: "Did you tell the doctor I have been refusing therapy?"   Cognition  Overall Cognitive Status: Appears within functional limits for tasks assessed/performed Arousal/Alertness: Awake/alert Orientation Level: Appears intact for tasks assessed Behavior During Session: Flat affect    Balance     End of Session PT - End of Session Equipment Utilized During Treatment: Gait belt;Oxygen Activity Tolerance: Patient limited by fatigue;Patient limited by pain Patient left: in chair;with call bell/phone within reach Nurse Communication: Mobility status    Newell Coral 09/01/2011, 11:59 AM  Newell Coral, PTA Acute Rehab [phone] (office)

## 2011-09-01 NOTE — Progress Notes (Signed)
Name: Natalie [name] MRN: [id number] DOB: 06-09-1930    LOS: 18 Date of admit 08/14/2011 11:10 PM   PULMONARY / CRITICAL CARE MEDICINE  HPI:  78 y/o female former smoker lived at home until April 2013 (pre April 2013 condition appears to be general failure to thrive and slow decline in functional status for 6-12 months and on baseline vicodin and ativan for chronic pain nos and chronic anxiety nos- when she was admitted for GI bleed. Then to Rehab and then admitted on 08/14/2011 with acute metabolic encephalopathy from acute on chronic hypercapnic respiratory failure with VDRF.  [name], narcotic use.  She was in hospital until April 30 for GI bleed from gastric ulcer and in rehab due to deconditioning.  She was tx for PNA in rehab. PMHx HTN, Hypothyroidism, PUD, A fib, COPD, Anxiety, Arthritis, Lt renal mass, Trigeminal neuralgia    SUBJECTIVE/OVERNIGHT/INTERVAL HX Seen by Dr. Isabel Caprice this morning.  Vital Signs: BP 152/80  Pulse 97  Temp(Src) 97.6 F (36.4 C) (Oral)  Resp 20  Ht 5\' 2"  (1.575 m)  Wt 130 lb 11.7 oz (59.3 kg)  BMI 23.91 kg/m2  SpO2 97% 2 liters   Intake/Output Summary (Last 24 hours) at 09/01/11 4098 Last data filed at 09/01/11 1191  Gross per 24 hour  Intake    780 ml  Output    800 ml  Net    -20 ml    Physical Exam: General - elderly female in NAD HEENT - no sinus tenderness Cardiac - irregular, no murmur Chest - diminished breath sounds, no wheeze Abd - soft, non-tender, BS + Ext - no edema Neuro - Awake, anxious at times, follows commands  Lab Results  Component Value Date   WBC 6.7 08/31/2011   HGB 11.7* 08/31/2011   HCT 35.6* 08/31/2011   MCV 87.5 08/31/2011   PLT 251 08/31/2011   Lab Results  Component Value Date   CREATININE 0.42* 08/31/2011   BUN 5* 08/31/2011   NA 133* 08/31/2011   K 3.0* 08/31/2011   CL 89* 08/31/2011   CO2 37* 08/31/2011   Lab Results  Component Value Date   ALT 38* 08/21/2011   AST  21 08/21/2011   ALKPHOS 38* 08/21/2011   BILITOT 0.5 08/21/2011     ASSESSMENT AND PLAN  PULMONARY ETT 5/24>>5/26   Acute on chronic hypoxic, hypercapnic respiratory failure secondary to AECOPD in setting of narcotic/benzo use.  Back to baseline.  Plan: - PRN BDs  CARDIOVASCULAR Echo 5/25>>EF 65 to 70%, mild MR, lipomatous hypertrophy of atrial septum Doppler legs 5/25>>no DVT  CAF >> w/ asymptomatic pauses.   Hx of HTN Acute Diastolic CHF 5/30 - 2 episodes of sinus pauses after haldol started 5/29. QTc and troponin normal. Cards consult - considered it asymptomatic 6/2>  3.16 sec pause 6/1, pt asymptomatic, Dr Herbie Baltimore w/ [facility] is aware Plan: - [facility] Cards will consider pauses significant when > 3 sec - Resumed warfarin 6/5 - Continue bisoprolol  - Continue ARB (ACE d/c'd 5/30)   GU/RENAL  Hx of renal mass>>seen on CT abd/pelvis 07/21/11 ("1.8 x 1.9 cm enhancing left renal lesion is highly suspicious for a solid neoplasm such as a renal cell carcinoma.") Urinary retention - failed 2nd attempt @ Foley cath removal 6/7, and she continues to have difficulty with urine retention 6/10. Plan: - Appreciate help from Dr. Isabel Caprice >> flomax added 6/11, keep foley in until re-assessed as outpt; no indication for  continue in-patient evaluation - D/C'ed Ipratropium bromide  HEMATOLOGIC  Anemia with recent hx of GI bleed.  No evidence for bleeding at this time. Tolerating warfarin  Plan: - Continue to monitor CBC intermittently - Pharmacy managing warfarin  GASTROINTESTINAL  Gastric ulcer with upper GI bleed in April 2013. Plan: - PPI changed to daily from BID 6/4 - Tolerating reinstitution of warfarin without evidence of re-bleeding  Failure to thrive. Plan: - Continue to encourage po intake  INFECTIOUS Cultures: Blood 5/25>>neg Urine 5/25>>GNR - PSEUDOMONAS Sputum 5/25>>Reincubated on 5/27 >NEG  Antibiotics: Vancomycin 05/25>>5/27 Zosyn 5/25>>5/28 Doxycycline  5/28>>5/29 Cefepime 5/29>>> 6/2  Possible sources of infection are HCAP or UTI - resolved Plan: - monitor off abx  NEUROLOGIC  Chronic pain, anxiety - baseline vicodin use and ativan use for 8 years Acut post ICU Delirium - Resolved 5/29 - bad delirum - started haldol 5/30 delirium much improved to near resolved. Pain improved with norco and tramadol -> haldol changed to seroquel 5/31 - Delirium again. Family convinced patient depressed 6/3 - delirium resolved 6/6: depression seemingly the overwhelming issue at this point.  6/7 D/C Seroquel Plan:  - Continue Cymbalta 30 mg daily - avoid benzo's/narcotics - Continue PRN Ultram  ENDOCRINE  Hx of hypothyroidism Plan: -Continue synthroid 75 mcg daily  GLOBAL  No further need for inpatient care.  Need to work on disposition.  Patient is concerned about how she will be able to take care of herself at home.    Discussed situation with daughter, [name].  She is agreeable to have her mother placed in nursing home again.  She is willing to pay out of pocket if facility of their choice is not covered by insurance.  She would only be interested in short term nursing home placement.  Attempted to contact daughter Gunnar Fusi to discuss situation also.  Was not able to reach her at listed number.  Then asked [name] to give Gunnar Fusi my contact number (which she did), but never got call back from [place].  She does not want to follow up with Dr. Clelia Croft as outpt, and wants to switch to Dr. Debby Bud as PCP.  She has not been seen by Dr. Debby Bud yet.  This will need to be arranged as outpt.  Coralyn Helling, MD [facility] Pulmonary/Critical Care 09/01/2011, 9:03 AM Pager:  [phone] After 3pm call: [phone]

## 2011-09-02 NOTE — Telephone Encounter (Signed)
[name] is aware.

## 2011-09-02 NOTE — Telephone Encounter (Signed)
Spoke with the daughter, Natalie [name].  She is aware that her Mother (the pt) stated she was happy with Dr.Shaw.  The only complaint the pt had was with Dr.Shaw's partner.  The daughter scheduled with Dr.John due to being told by another scheduler that Dr.Norins does not accept new patients.  I'll be happy to add the patient on as a new pt, if you would like.  The pt is happy with Dr.Shaw, however.  It is the daughter that is hoping to change.

## 2011-10-06 ENCOUNTER — Ambulatory Visit: Payer: Medicare Other | Admitting: Internal Medicine

## 2011-10-28 ENCOUNTER — Ambulatory Visit: Payer: Medicare Other | Admitting: Internal Medicine

## 2011-11-16 ENCOUNTER — Ambulatory Visit: Payer: Medicare Other | Admitting: Internal Medicine

## 2012-01-06 ENCOUNTER — Ambulatory Visit (HOSPITAL_COMMUNITY): Admission: RE | Admit: 2012-01-06 | Payer: Medicare Other | Source: Ambulatory Visit

## 2012-01-12 ENCOUNTER — Encounter (HOSPITAL_COMMUNITY)
Admission: RE | Admit: 2012-01-12 | Discharge: 2012-01-12 | Disposition: A | Discharge status: Home / Self Care | Payer: Medicare Other | Source: Ambulatory Visit | Attending: Internal Medicine | Admitting: Internal Medicine

## 2012-01-12 ENCOUNTER — Encounter (HOSPITAL_COMMUNITY): Payer: Self-pay

## 2012-01-12 DIAGNOSIS — M810 Age-related osteoporosis without current pathological fracture: Secondary | ICD-10-CM | POA: Insufficient documentation

## 2012-01-12 MED ORDER — ZOLEDRONIC ACID 5 MG/100ML IV SOLN
5.0000 mg | Freq: Once | INTRAVENOUS | Status: AC
  Administered 2012-01-12: 5 mg via INTRAVENOUS
  Filled 2012-01-12: qty 100

## 2012-01-12 MED ORDER — SODIUM CHLORIDE 0.9 % IV SOLN
Freq: Once | INTRAVENOUS | Status: AC
  Administered 2012-01-12: 14:00:00 via INTRAVENOUS

## 2012-06-06 ENCOUNTER — Ambulatory Visit: Payer: Self-pay | Admitting: Cardiovascular Disease

## 2012-06-06 DIAGNOSIS — Z7901 Long term (current) use of anticoagulants: Secondary | ICD-10-CM | POA: Insufficient documentation

## 2012-08-03 ENCOUNTER — Other Ambulatory Visit: Payer: Self-pay | Admitting: *Deleted

## 2012-08-03 ENCOUNTER — Other Ambulatory Visit: Payer: Self-pay | Admitting: Orthopedic Surgery

## 2012-08-03 DIAGNOSIS — R609 Edema, unspecified: Secondary | ICD-10-CM

## 2012-08-03 DIAGNOSIS — R52 Pain, unspecified: Secondary | ICD-10-CM

## 2012-08-04 ENCOUNTER — Ambulatory Visit (INDEPENDENT_AMBULATORY_CARE_PROVIDER_SITE_OTHER): Payer: Self-pay | Admitting: Pharmacist Clinician (PhC)/ Clinical Pharmacy Specialist

## 2012-08-04 ENCOUNTER — Other Ambulatory Visit: Payer: Medicare Other

## 2012-08-04 ENCOUNTER — Emergency Department (HOSPITAL_COMMUNITY)
Admission: EM | Admit: 2012-08-04 | Discharge: 2012-08-05 | Disposition: A | Discharge status: Home / Self Care | Payer: Medicare Other | Attending: Emergency Medicine | Admitting: Emergency Medicine

## 2012-08-04 DIAGNOSIS — F411 Generalized anxiety disorder: Secondary | ICD-10-CM | POA: Insufficient documentation

## 2012-08-04 DIAGNOSIS — W19XXXA Unspecified fall, initial encounter: Secondary | ICD-10-CM

## 2012-08-04 DIAGNOSIS — Z79899 Other long term (current) drug therapy: Secondary | ICD-10-CM | POA: Insufficient documentation

## 2012-08-04 DIAGNOSIS — I129 Hypertensive chronic kidney disease with stage 1 through stage 4 chronic kidney disease, or unspecified chronic kidney disease: Secondary | ICD-10-CM | POA: Insufficient documentation

## 2012-08-04 DIAGNOSIS — Z8742 Personal history of other diseases of the female genital tract: Secondary | ICD-10-CM | POA: Insufficient documentation

## 2012-08-04 DIAGNOSIS — G8929 Other chronic pain: Secondary | ICD-10-CM | POA: Insufficient documentation

## 2012-08-04 DIAGNOSIS — S0100XA Unspecified open wound of scalp, initial encounter: Secondary | ICD-10-CM | POA: Insufficient documentation

## 2012-08-04 DIAGNOSIS — I4891 Unspecified atrial fibrillation: Secondary | ICD-10-CM

## 2012-08-04 DIAGNOSIS — J449 Chronic obstructive pulmonary disease, unspecified: Secondary | ICD-10-CM | POA: Insufficient documentation

## 2012-08-04 DIAGNOSIS — Y9301 Activity, walking, marching and hiking: Secondary | ICD-10-CM | POA: Insufficient documentation

## 2012-08-04 DIAGNOSIS — Z8669 Personal history of other diseases of the nervous system and sense organs: Secondary | ICD-10-CM | POA: Insufficient documentation

## 2012-08-04 DIAGNOSIS — Z8711 Personal history of peptic ulcer disease: Secondary | ICD-10-CM | POA: Insufficient documentation

## 2012-08-04 DIAGNOSIS — Y92009 Unspecified place in unspecified non-institutional (private) residence as the place of occurrence of the external cause: Secondary | ICD-10-CM | POA: Insufficient documentation

## 2012-08-04 DIAGNOSIS — Z87891 Personal history of nicotine dependence: Secondary | ICD-10-CM | POA: Insufficient documentation

## 2012-08-04 DIAGNOSIS — J4489 Other specified chronic obstructive pulmonary disease: Secondary | ICD-10-CM | POA: Insufficient documentation

## 2012-08-04 DIAGNOSIS — N189 Chronic kidney disease, unspecified: Secondary | ICD-10-CM | POA: Insufficient documentation

## 2012-08-04 DIAGNOSIS — Z8701 Personal history of pneumonia (recurrent): Secondary | ICD-10-CM | POA: Insufficient documentation

## 2012-08-04 DIAGNOSIS — E039 Hypothyroidism, unspecified: Secondary | ICD-10-CM | POA: Insufficient documentation

## 2012-08-04 DIAGNOSIS — Z7901 Long term (current) use of anticoagulants: Secondary | ICD-10-CM

## 2012-08-04 DIAGNOSIS — W1809XA Striking against other object with subsequent fall, initial encounter: Secondary | ICD-10-CM | POA: Insufficient documentation

## 2012-08-04 DIAGNOSIS — D689 Coagulation defect, unspecified: Secondary | ICD-10-CM | POA: Insufficient documentation

## 2012-08-04 DIAGNOSIS — Z23 Encounter for immunization: Secondary | ICD-10-CM | POA: Insufficient documentation

## 2012-08-04 DIAGNOSIS — M810 Age-related osteoporosis without current pathological fracture: Secondary | ICD-10-CM | POA: Insufficient documentation

## 2012-08-04 DIAGNOSIS — S0101XA Laceration without foreign body of scalp, initial encounter: Secondary | ICD-10-CM

## 2012-08-04 DIAGNOSIS — Z8719 Personal history of other diseases of the digestive system: Secondary | ICD-10-CM | POA: Insufficient documentation

## 2012-08-04 DIAGNOSIS — R000 Tachycardia, unspecified: Secondary | ICD-10-CM | POA: Insufficient documentation

## 2012-08-04 DIAGNOSIS — I4819 Other persistent atrial fibrillation: Secondary | ICD-10-CM

## 2012-08-04 NOTE — ED Notes (Signed)
Per EMS pt lost her footing and fell and hit head on corner of fridge at home. Pt denies LOC or back pain. Pt presents with approx 2in laceration to posterior left head. Pt with hx of chronic knee pain, arthritis, AFIB, and anxiety. Pt c/o increased anxiety at this time. PT A&Ox4

## 2012-08-05 ENCOUNTER — Emergency Department (HOSPITAL_COMMUNITY): Payer: Medicare Other

## 2012-08-05 ENCOUNTER — Other Ambulatory Visit: Payer: Medicare Other

## 2012-08-05 LAB — BASIC METABOLIC PANEL
BUN: 12 mg/dL (ref 6–23)
Calcium: 9 mg/dL (ref 8.4–10.5)
Chloride: 94 mEq/L — ABNORMAL LOW (ref 96–112)
Creatinine, Ser: 0.6 mg/dL (ref 0.50–1.10)
GFR calc Af Amer: >90 mL/min (ref >90)
GFR calc non Af Amer: 84 mL/min — ABNORMAL LOW (ref >90)

## 2012-08-05 LAB — CBC WITH DIFFERENTIAL/PLATELET
Basophils Absolute: 0 10*3/uL (ref 0.0–0.1)
Basophils Relative: 0 % (ref 0–1)
Eosinophils Absolute: 0.1 10*3/uL (ref 0.0–0.7)
HCT: 30.2 % — ABNORMAL LOW (ref 36.0–46.0)
Hemoglobin: 9.7 g/dL — ABNORMAL LOW (ref 12.0–15.0)
MCH: 23.7 pg — ABNORMAL LOW (ref 26.0–34.0)
MCHC: 32.1 g/dL (ref 30.0–36.0)
Monocytes Absolute: 1 10*3/uL (ref 0.1–1.0)
Monocytes Relative: 11 % (ref 3–12)
Neutro Abs: 5.7 10*3/uL (ref 1.7–7.7)
RDW: 16 % — ABNORMAL HIGH (ref 11.5–15.5)

## 2012-08-05 LAB — PROTIME-INR
INR: 2.19 — ABNORMAL HIGH (ref 0.00–1.49)
Prothrombin Time: 23.4 seconds — ABNORMAL HIGH (ref 11.6–15.2)

## 2012-08-05 MED ORDER — LORAZEPAM 2 MG/ML IJ SOLN
1.0000 mg | Freq: Once | INTRAMUSCULAR | Status: DC
  Filled 2012-08-05: qty 1

## 2012-08-05 MED ORDER — ALPRAZOLAM 0.25 MG PO TABS
1.0000 mg | ORAL_TABLET | Freq: Once | ORAL | Status: AC
  Administered 2012-08-05: 1 mg via ORAL
  Filled 2012-08-05: qty 3
  Filled 2012-08-05: qty 1

## 2012-08-05 MED ORDER — TETANUS-DIPHTH-ACELL PERTUSSIS 5-2.5-18.5 LF-MCG/0.5 IM SUSP
0.5000 mL | Freq: Once | INTRAMUSCULAR | Status: AC
  Administered 2012-08-05: 0.5 mL via INTRAMUSCULAR
  Filled 2012-08-05: qty 0.5

## 2012-08-05 NOTE — ED Provider Notes (Signed)
History     CSN: [id number]  Arrival date & time 08/04/12  2342   First MD Initiated Contact with Patient 08/04/12 2359      Chief Complaint  Patient presents with  . Fall  . Head Laceration    (Consider location/radiation/quality/duration/timing/severity/associated sxs/prior treatment) Patient is a 79 y.o. female presenting with fall and scalp laceration. The history is provided by the patient and medical records. No language interpreter was used.  Fall The accident occurred less than 1 hour ago. The fall occurred while walking. Distance fallen: ground level. She landed on a hard floor. The volume of blood lost was minimal. The point of impact was the head. The pain is present in the head. The pain is at a severity of 3/10. The pain is mild. She was ambulatory at the scene. There was no entrapment after the fall. There was no drug use involved in the accident. There was no alcohol use involved in the accident. Pertinent negatives include no visual change, no fever, no numbness, no abdominal pain, no bowel incontinence, no nausea, no vomiting, no hematuria, no headaches, no hearing loss, no loss of consciousness and no tingling. The symptoms are aggravated by pressure on the injury. She has tried nothing for the symptoms.  Head Laceration Pertinent negatives include no abdominal pain, chest pain, coughing, diaphoresis, fatigue, fever, headaches, nausea, numbness, rash, visual change or vomiting.    Natalie [name] is a 79 y.o. female  with a hx of osteoporosis, hypertension, A. Fib on Coumadin, COPD presents to the Emergency Department complaining of acute onset laceration to the occiput of her head after falling in the kitchen tonight approximately 30 minutes prior to arrival. Patient states she was in the kitchen and turned around but the coughing therefore greater when she lost her footing and fell backwards hitting her head on the refrigerator. She denies loss of consciousness, neck or  back pain. She was ambulatory after the event without difficulty. Associated symptoms include laceration and persistent bleeding along with anxiety.  Nothing makes it better or worse.  Pt denies fever, chills, headache, neck pain, back pain, chest pain, shortness of breath, abdominal pain, nausea, vomiting, diarrhea, weakness, dizziness, syncope, dysuria, hematuria.     Past Medical History  Diagnosis Date  . Osteoporosis   . Hypertension   . Hypothyroidism   . Lower GI bleeding 07/21/11  . Atrial fibrillation   . COPD (chronic obstructive pulmonary disease)   . Pneumonia   . Shortness of breath 07/21/11    "anytime"  . Sinus headache 07/21/11    "all the time"  . Bladder infection, chronic   . Arthritis     "everywhere"  . Anxiety   . Chronic lower back pain   . Chronic abdominal pain   . Renal mass, left 2013  . PUD (peptic ulcer disease)   . Chronic cystitis   . Trigeminal neuralgia     /E-chart  . Chronic kidney disease     Past Surgical History  Procedure Laterality Date  . Vaginal hysterectomy      partial  . Cataract extraction w/ intraocular lens  implant, bilateral    . Percutaneous pinning femoral neck fracture  11/2003    w/closed reduction ; left/E-chart  . Laceration repair  11/2003    left eyebrow  . Bilateral salpingoophorectomy  12/2005    lap/E-chart  . Cholecystectomy  1960  . Appendectomy      presumed/E-chart  . Esophagogastroduodenoscopy  07/23/2011  Procedure: ESOPHAGOGASTRODUODENOSCOPY (EGD);  Surgeon: Graylin Shiver, MD;  Location: [facility] ENDOSCOPY;  Service: Endoscopy;  Laterality: N/A;    No family history on file.  History  Substance Use Topics  . Smoking status: Former Smoker -- 1.00 packs/day    Types: Cigarettes    Quit date: 09/11/2009  . Smokeless tobacco: Never Used     Comment: 07/21/11 "can't remember when I quit smoking"  . Alcohol Use: 1.2 oz/week    2 Glasses of wine per week     Comment: 07/21/11 "seldom drink"    OB History    Grav Para Term Preterm Abortions TAB SAB Ect Mult Living                  Review of Systems  Constitutional: Negative for fever, diaphoresis, appetite change, fatigue and unexpected weight change.  HENT: Negative for mouth sores and neck stiffness.   Eyes: Negative for visual disturbance.  Respiratory: Negative for cough, chest tightness, shortness of breath and wheezing.   Cardiovascular: Negative for chest pain.  Gastrointestinal: Negative for nausea, vomiting, abdominal pain, diarrhea, constipation and bowel incontinence.  Endocrine: Negative for polydipsia, polyphagia and polyuria.  Genitourinary: Negative for dysuria, urgency, frequency and hematuria.  Musculoskeletal: Negative for back pain.  Skin: Positive for wound. Negative for rash.       Laceration, scalp  Allergic/Immunologic: Negative for immunocompromised state.  Neurological: Negative for tingling, loss of consciousness, syncope, light-headedness, numbness and headaches.  Hematological: Does not bruise/bleed easily.  Psychiatric/Behavioral: Negative for sleep disturbance. The patient is not nervous/anxious.     Allergies  Levofloxacin  Home Medications   Current Outpatient Rx  Name  Route  Sig  Dispense  Refill  . ALPRAZolam (XANAX) 0.25 MG tablet   Oral   Take 0.25 mg by mouth every 8 (eight) hours.         Marland Kitchen ascorbic acid (VITAMIN C) 500 MG tablet   Oral   Take 1,000 mg by mouth daily.         . benazepril (LOTENSIN) 40 MG tablet   Oral   Take 40 mg by mouth daily.         . bisoprolol (ZEBETA) 5 MG tablet   Oral   Take 5 mg by mouth daily.         . calcium-vitamin D (OSCAL WITH D) 500-200 MG-UNIT per tablet   Oral   Take 1 tablet by mouth 3 (three) times daily.         Marland Kitchen dicyclomine (BENTYL) 10 MG capsule   Oral   Take 10 mg by mouth 4 (four) times daily -  before meals and at bedtime.         . DULoxetine (CYMBALTA) 60 MG capsule   Oral   Take 60 mg by mouth daily.         .  furosemide (LASIX) 40 MG tablet   Oral   Take 40 mg by mouth daily.         Marland Kitchen HYDROcodone-acetaminophen (NORCO/VICODIN) 5-325 MG per tablet   Oral   Take 1 tablet by mouth 2 (two) times daily.         . lansoprazole (PREVACID) 30 MG capsule   Oral   Take 30 mg by mouth 2 (two) times daily before a meal.         . levalbuterol (XOPENEX) 0.63 MG/3ML nebulizer solution   Nebulization   Take 0.63 mg by nebulization every 6 (six) hours as needed  for wheezing or shortness of breath.         . levothyroxine (SYNTHROID, LEVOTHROID) 88 MCG tablet   Oral   Take 88 mcg by mouth daily before breakfast.         . tamsulosin (FLOMAX) 0.4 MG CAPS   Oral   Take 0.4 mg by mouth daily.         Marland Kitchen warfarin (COUMADIN) 0.5 mg TABS   Oral   Take 0.5 mg by mouth daily.           BP 145/84  Pulse 102  Temp(Src) 98.4 F (36.9 C) (Oral)  Resp 21  SpO2 94%  Physical Exam  Nursing note and vitals reviewed. Constitutional: She is oriented to person, place, and time. She appears well-developed and well-nourished. No distress.  HENT:  Head: Normocephalic. Head is with laceration.    Right Ear: Hearing, tympanic membrane, external ear and ear canal normal.  Left Ear: Hearing, tympanic membrane, external ear and ear canal normal.  Nose: Nose normal. No mucosal edema or rhinorrhea.  Mouth/Throat: Uvula is midline, oropharynx is clear and moist and mucous membranes are normal. Mucous membranes are not dry and not cyanotic. No edematous. No oropharyngeal exudate, posterior oropharyngeal edema, posterior oropharyngeal erythema or tonsillar abscesses.  Eyes: Conjunctivae and EOM are normal. Pupils are equal, round, and reactive to light. No scleral icterus.  Neck: Normal range of motion and full passive range of motion without pain. Neck supple. No spinous process tenderness and no muscular tenderness present. No rigidity. Normal range of motion present.  Cardiovascular: Regular rhythm, S1  normal, S2 normal, normal heart sounds and intact distal pulses.  Tachycardia present.   No murmur heard. Pulses:      Radial pulses are 2+ on the right side, and 2+ on the left side.       Dorsalis pedis pulses are 2+ on the right side, and 2+ on the left side.       Posterior tibial pulses are 2+ on the right side, and 2+ on the left side.  Pulmonary/Chest: Effort normal and breath sounds normal. No accessory muscle usage. Not tachypneic. No respiratory distress. She has no decreased breath sounds. She has no wheezes. She has no rhonchi. She has no rales.  Abdominal: Soft. Bowel sounds are normal. She exhibits no distension and no mass. There is no tenderness. There is no rebound and no guarding.  Musculoskeletal: Normal range of motion. She exhibits no edema.  Lymphadenopathy:    She has no cervical adenopathy.  Neurological: She is alert and oriented to person, place, and time. No cranial nerve deficit. She exhibits normal muscle tone. Coordination normal.  Speech is clear and goal oriented, follows commands Major Cranial nerves without deficit, no facial droop Normal strength in upper and lower extremities bilaterally including dorsiflexion and plantar flexion, strong and equal grip strength Sensation normal to light and sharp touch Moves extremities without ataxia, coordination intact Normal finger to nose and rapid alternating movements Pt ambulates with assistance to baseline  Skin: Skin is warm and dry. No rash noted. She is not diaphoretic. No erythema.  5cm laceration to the occiput  Psychiatric: She has a normal mood and affect. Her behavior is normal.    ED Course  LACERATION REPAIR Date/Time: 08/05/2012 2:02 AM Performed by: Dierdre Forth Authorized by: Dierdre Forth Consent: Verbal consent obtained. Risks and benefits: risks, benefits and alternatives were discussed Consent given by: patient Patient understanding: patient states understanding of the  procedure  being performed Patient consent: the patient's understanding of the procedure matches consent given Relevant documents: relevant documents present and verified Site marked: the operative site was marked Imaging studies: imaging studies available Required items: required blood products, implants, devices, and special equipment available Patient identity confirmed: verbally with patient and arm band Time out: Immediately prior to procedure a "time out" was called to verify the correct patient, procedure, equipment, support staff and site/side marked as required. Body area: head/neck Location details: scalp Laceration length: 5 cm Foreign bodies: no foreign bodies Tendon involvement: none Nerve involvement: none Vascular damage: no Patient sedated: no Preparation: Patient was prepped and draped in the usual sterile fashion. Irrigation solution: saline Irrigation method: syringe Amount of cleaning: standard Debridement: none Degree of undermining: none Skin closure: staples Number of sutures: 4 Approximation: close Approximation difficulty: simple Dressing: 4x4 sterile gauze Patient tolerance: Patient tolerated the procedure well with no immediate complications.   (including critical care time)  Labs Reviewed  PROTIME-INR - Abnormal; Notable for the following:    Prothrombin Time 23.4 (*)    INR 2.19 (*)    All other components within normal limits  CBC WITH DIFFERENTIAL - Abnormal; Notable for the following:    Hemoglobin 9.7 (*)    HCT 30.2 (*)    MCV 73.8 (*)    MCH 23.7 (*)    RDW 16.0 (*)    All other components within normal limits  BASIC METABOLIC PANEL - Abnormal; Notable for the following:    Sodium 134 (*)    Potassium 3.3 (*)    Chloride 94 (*)    Glucose, Bld 111 (*)    GFR calc non Af Amer 84 (*)    All other components within normal limits   Ct Head Wo Contrast  08/05/2012   *RADIOLOGY REPORT*  Clinical Data:  Fall, hit back of head.  CT HEAD  WITHOUT CONTRAST CT CERVICAL SPINE WITHOUT CONTRAST  Technique:  Multidetector CT imaging of the head and cervical spine was performed following the standard protocol without intravenous contrast.  Multiplanar CT image reconstructions of the cervical spine were also generated.  Comparison:  01/11/2006  CT HEAD  Findings: There is atrophy and chronic small vessel disease changes.  Old right frontal infarct with encephalomalacia.  Old bilateral thalamic lacunar infarcts.  Bilateral internal capsule lacunar infarcts also appear old.  No acute infarction.  No hemorrhage or hydrocephalus.  Air-fluid level in the left maxillary sinus.  Remainder of the paranasal sinuses and mastoids are clear.  No acute calvarial abnormality.  IMPRESSION: No acute intracranial abnormality.  Atrophy, chronic microvascular disease.  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  CT CERVICAL SPINE  Findings: Degenerative disc disease and facet disease throughout the cervical spine.  Normal alignment.  Prevertebral soft tissues are normal.  No fracture.  No epidural or paraspinal hematoma.  IMPRESSION: Degenerative changes.  No acute bony abnormality.   Original Report Authenticated By: Charlett Nose, M.D.   Ct Cervical Spine Wo Contrast  08/05/2012   *RADIOLOGY REPORT*  Clinical Data:  Fall, hit back of head.  CT HEAD WITHOUT CONTRAST CT CERVICAL SPINE WITHOUT CONTRAST  Technique:  Multidetector CT imaging of the head and cervical spine was performed following the standard protocol without intravenous contrast.  Multiplanar CT image reconstructions of the cervical spine were also generated.  Comparison:  01/11/2006  CT HEAD  Findings: There is atrophy and chronic small vessel disease changes.  Old right frontal infarct with encephalomalacia.  Old bilateral thalamic lacunar infarcts.  Bilateral internal capsule lacunar infarcts also appear old.  No acute infarction.  No hemorrhage or hydrocephalus.  Air-fluid  level in the left maxillary sinus.  Remainder of the paranasal sinuses and mastoids are clear.  No acute calvarial abnormality.  IMPRESSION: No acute intracranial abnormality.  Atrophy, chronic microvascular disease.  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  CT CERVICAL SPINE  Findings: Degenerative disc disease and facet disease throughout the cervical spine.  Normal alignment.  Prevertebral soft tissues are normal.  No fracture.  No epidural or paraspinal hematoma.  IMPRESSION: Degenerative changes.  No acute bony abnormality.   Original Report Authenticated By: Charlett Nose, M.D.     1. Fall at home, initial encounter   2. Laceration of scalp, initial encounter   3. Anticoagulation adequate with anticoagulant therapy   4. ANXIETY DISORDER       MDM  ANNAIS CRAFTS presents after fall.  Tdap booster given.  Pressure irrigation performed. Laceration occurred < 8 hours prior to repair which was well tolerated. Pt has no co morbidities to effect normal wound healing. Discussed suture home care w pt and answered questions. Pt to f-u for wound check and suture removal in 7 days. Pt is hemodynamically stable w no complaints prior to dc.  I have also discussed reasons to return immediately to the ER.  Patient expresses understanding and agrees with plan.  Dr. Marisa Severin was consulted, evaluated this patient with me and agrees with the plan.              Dahlia Client Clerance Umland, PA-C 08/05/12 0225  Dahlia Client Kadejah Sandiford, PA-C 08/05/12 1610

## 2012-08-05 NOTE — ED Notes (Signed)
Hannah PA repaired laceration with 4 staples. Pt tolerated well.

## 2012-08-05 NOTE — ED Notes (Signed)
Pt comfortable with d/c and f/u instructions. 

## 2012-08-05 NOTE — ED Notes (Signed)
PT returned from CT. Pt alert and interactive, in NAD at this time, using bedside commode with assistance of EMT

## 2012-08-05 NOTE — ED Provider Notes (Signed)
Medical screening examination/treatment/procedure(s) were conducted as a shared visit with non-physician practitioner(s) and myself.  I personally evaluated the patient during the encounter.  Pt s/p mechanical fall, no LOC, on coumadin which is therapeutic.  Pt with normal head CT, laceration repaired, has family who will be staying with patient.  Olivia Mackie, MD 08/05/12 [phone]

## 2012-08-05 NOTE — ED Notes (Signed)
Pt declines Ativan, EDPA aware

## 2012-08-08 ENCOUNTER — Ambulatory Visit
Admission: RE | Admit: 2012-08-08 | Discharge: 2012-08-08 | Disposition: A | Discharge status: Home / Self Care | Payer: Medicare Other | Source: Ambulatory Visit | Attending: Orthopedic Surgery | Admitting: Orthopedic Surgery

## 2012-08-08 DIAGNOSIS — R609 Edema, unspecified: Secondary | ICD-10-CM

## 2012-08-08 DIAGNOSIS — R52 Pain, unspecified: Secondary | ICD-10-CM

## 2012-08-10 ENCOUNTER — Other Ambulatory Visit: Payer: Medicare Other

## 2012-08-11 ENCOUNTER — Ambulatory Visit
Admission: RE | Admit: 2012-08-11 | Discharge: 2012-08-11 | Disposition: A | Discharge status: Home / Self Care | Payer: Medicare Other | Source: Ambulatory Visit | Attending: Orthopedic Surgery | Admitting: Orthopedic Surgery

## 2012-08-11 DIAGNOSIS — R609 Edema, unspecified: Secondary | ICD-10-CM

## 2012-08-11 DIAGNOSIS — R52 Pain, unspecified: Secondary | ICD-10-CM

## 2012-08-17 ENCOUNTER — Encounter: Payer: Self-pay | Admitting: Cardiology

## 2012-08-30 ENCOUNTER — Encounter: Payer: Self-pay | Admitting: Cardiovascular Disease

## 2012-08-31 ENCOUNTER — Other Ambulatory Visit: Payer: Self-pay | Admitting: Orthopedic Surgery

## 2012-08-31 DIAGNOSIS — M79604 Pain in right leg: Secondary | ICD-10-CM

## 2012-09-09 ENCOUNTER — Ambulatory Visit (INDEPENDENT_AMBULATORY_CARE_PROVIDER_SITE_OTHER): Payer: Medicare Other | Admitting: Pharmacist Clinician (PhC)/ Clinical Pharmacy Specialist

## 2012-09-09 ENCOUNTER — Encounter: Payer: Self-pay | Admitting: Cardiovascular Disease

## 2012-09-09 VITALS — BP 150/92 | HR 76

## 2012-09-09 DIAGNOSIS — I4891 Unspecified atrial fibrillation: Secondary | ICD-10-CM

## 2012-09-09 DIAGNOSIS — I4819 Other persistent atrial fibrillation: Secondary | ICD-10-CM

## 2012-09-09 DIAGNOSIS — Z7901 Long term (current) use of anticoagulants: Secondary | ICD-10-CM

## 2012-09-09 LAB — POCT INR: INR: 3.3

## 2012-09-22 ENCOUNTER — Encounter: Payer: Self-pay | Admitting: Surgery

## 2012-09-22 ENCOUNTER — Other Ambulatory Visit: Payer: Self-pay

## 2012-09-22 DIAGNOSIS — L539 Erythematous condition, unspecified: Secondary | ICD-10-CM

## 2012-09-22 DIAGNOSIS — M7989 Other specified soft tissue disorders: Secondary | ICD-10-CM

## 2012-10-07 ENCOUNTER — Ambulatory Visit (INDEPENDENT_AMBULATORY_CARE_PROVIDER_SITE_OTHER): Payer: Medicare Other | Admitting: Pharmacist Clinician (PhC)/ Clinical Pharmacy Specialist

## 2012-10-07 VITALS — BP 146/88 | HR 76

## 2012-10-07 DIAGNOSIS — I4819 Other persistent atrial fibrillation: Secondary | ICD-10-CM

## 2012-10-07 DIAGNOSIS — Z7901 Long term (current) use of anticoagulants: Secondary | ICD-10-CM

## 2012-10-07 DIAGNOSIS — I4891 Unspecified atrial fibrillation: Secondary | ICD-10-CM

## 2012-10-07 LAB — POCT INR: INR: 2.7

## 2012-10-14 ENCOUNTER — Telehealth: Payer: Self-pay | Admitting: Cardiovascular Disease

## 2012-10-14 NOTE — Telephone Encounter (Signed)
Pt is calling regarding her coumadin. She needs for someone to call her back. She needs to be put on some other medication and she has some questions.

## 2012-10-14 NOTE — Telephone Encounter (Signed)
Spoke to patient.She stated that she already had the answer she needed. She says she spoke with another doctor about taking antibiotics with her coumadin.

## 2012-10-28 ENCOUNTER — Encounter: Payer: Self-pay | Admitting: Surgery

## 2012-10-31 ENCOUNTER — Encounter: Payer: Self-pay | Admitting: Surgery

## 2012-10-31 ENCOUNTER — Ambulatory Visit (INDEPENDENT_AMBULATORY_CARE_PROVIDER_SITE_OTHER): Payer: Medicare Other | Admitting: Surgery

## 2012-10-31 ENCOUNTER — Encounter (INDEPENDENT_AMBULATORY_CARE_PROVIDER_SITE_OTHER): Payer: Medicare Other | Admitting: *Deleted

## 2012-10-31 VITALS — BP 173/98 | HR 72 | Temp 98.1°F | Ht 62.0 in | Wt 149.1 lb

## 2012-10-31 DIAGNOSIS — M7989 Other specified soft tissue disorders: Secondary | ICD-10-CM

## 2012-10-31 DIAGNOSIS — L539 Erythematous condition, unspecified: Secondary | ICD-10-CM

## 2012-10-31 NOTE — Progress Notes (Signed)
[facility]   Patient name: Natalie [name] MRN: [id number] DOB: 03/08/1931 Sex: female   Referred by: Dr. Clelia Croft  Reason for referral:  Chief Complaint  Patient presents with  . New Evaluation    bilateral venous stasis - Dr. Renae Fickle    HISTORY OF PRESENT ILLNESS: This is an 79 year old female who comes in today with complaints of bilateral lower extremity swelling and pain. She states that this is been going on for approximately one year and has been getting worse. She denies a history of varicose veins. She denies a history of DVT/pulmonary embolism. She has tried diuretics, but have given her minimal relief. She has attempted to wear knee-high compression stockings but states they cut off the circulation to her leg. She was having significant discomfort. She did not have any ulcerations. She's not had any episodes of bleeding.  She is medically managed for her hypertension. She has COPD secondary to tobacco abuse. She takes Coumadin for a fibrillation   Past Medical History  Diagnosis Date  . Osteoporosis   . Hypertension   . Hypothyroidism   . Lower GI bleeding 07/21/11  . Atrial fibrillation   . COPD (chronic obstructive pulmonary disease)   . Pneumonia   . Shortness of breath 07/21/11    "anytime"  . Sinus headache 07/21/11    "all the time"  . Bladder infection, chronic   . Arthritis     "everywhere"  . Anxiety   . Chronic lower back pain   . Chronic abdominal pain   . Renal mass, left 2013  . PUD (peptic ulcer disease)   . Chronic cystitis   . Trigeminal neuralgia     /E-chart  . Chronic kidney disease   . Anemia     Past Surgical History  Procedure Laterality Date  . Vaginal hysterectomy      partial  . Cataract extraction w/ intraocular lens  implant, bilateral    . Percutaneous pinning femoral neck fracture  11/2003    w/closed reduction ; left/E-chart  . Laceration repair  11/2003    left eyebrow  . Bilateral  salpingoophorectomy  12/2005    lap/E-chart  . Cholecystectomy  1960  . Appendectomy      presumed/E-chart  . Esophagogastroduodenoscopy  07/23/2011    Procedure: ESOPHAGOGASTRODUODENOSCOPY (EGD);  Surgeon: Graylin Shiver, MD;  Location: [facility] ENDOSCOPY;  Service: Endoscopy;  Laterality: N/A;    History   Social History  . Marital Status: Widowed    Spouse Name: N/A    Number of Children: N/A  . Years of Education: N/A   Occupational History  . Not on file.   Social History Main Topics  . Smoking status: Former Smoker -- 1.00 packs/day    Types: Cigarettes    Quit date: 09/11/2009  . Smokeless tobacco: Never Used     Comment: 07/21/11 "can't remember when I quit smoking"  . Alcohol Use: 1.2 oz/week    2 Glasses of wine per week     Comment: 07/21/11 "seldom drink"  . Drug Use: No  . Sexually Active: No   Other Topics Concern  . Not on file   Social History Narrative  . No narrative on file    History reviewed. No pertinent family history.  Allergies as of 10/31/2012 - Review Complete 10/31/2012  Allergen Reaction Noted  . Levofloxacin Other (See Comments)     Current Outpatient Prescriptions on File Prior to Visit  Medication Sig Dispense Refill  .  ALPRAZolam (XANAX) 0.25 MG tablet Take 0.25 mg by mouth every 8 (eight) hours.      Marland Kitchen ascorbic acid (VITAMIN C) 500 MG tablet Take 1,000 mg by mouth daily.      Marland Kitchen dicyclomine (BENTYL) 10 MG capsule Take 10 mg by mouth 4 (four) times daily -  before meals and at bedtime.      . DULoxetine (CYMBALTA) 60 MG capsule Take 60 mg by mouth daily.      Marland Kitchen HYDROcodone-acetaminophen (NORCO/VICODIN) 5-325 MG per tablet Take 1 tablet by mouth 2 (two) times daily.      . lansoprazole (PREVACID) 30 MG capsule Take 30 mg by mouth 2 (two) times daily before a meal.      . levothyroxine (SYNTHROID, LEVOTHROID) 88 MCG tablet Take 88 mcg by mouth daily before breakfast.      . tamsulosin (FLOMAX) 0.4 MG CAPS Take 0.4 mg by mouth daily.      Marland Kitchen  warfarin (COUMADIN) 0.5 mg TABS Take 0.5 mg by mouth daily.      . benazepril (LOTENSIN) 40 MG tablet Take 40 mg by mouth daily.      . bisoprolol (ZEBETA) 5 MG tablet Take 5 mg by mouth daily.      . calcium-vitamin D (OSCAL WITH D) 500-200 MG-UNIT per tablet Take 1 tablet by mouth 3 (three) times daily.      . furosemide (LASIX) 40 MG tablet Take 40 mg by mouth daily.      Marland Kitchen levalbuterol (XOPENEX) 0.63 MG/3ML nebulizer solution Take 0.63 mg by nebulization every 6 (six) hours as needed for wheezing or shortness of breath.       No current facility-administered medications on file prior to visit.     REVIEW OF SYSTEMS: Cardiovascular: Positive for pain in legs with walking and lying flat. Positive swelling in the legs Pulmonary: No productive cough, asthma or wheezing. Neurologic: No weakness, paresthesias, aphasia, or amaurosis. Positive dizziness. Hematologic: No bleeding problems or clotting disorders. Musculoskeletal: No joint pain or joint swelling. Gastrointestinal: No blood in stool or hematemesis Genitourinary: No dysuria or hematuria. Psychiatric:: No history of major depression. Integumentary: No rashes or ulcers. Constitutional: No fever or chills.  PHYSICAL EXAMINATION: General: The patient appears their stated age.  Vital signs are BP 173/98  Pulse 72  Temp(Src) 98.1 F (36.7 C) (Oral)  Ht 5\' 2"  (1.575 m)  Wt 149 lb 1.6 oz (67.631 kg)  BMI 27.26 kg/m2  SpO2 97% HEENT:  No gross abnormalities Pulmonary: Respirations are non-labored  Musculoskeletal: There are no major deformities.   Neurologic: No focal weakness or paresthesias are detected, Skin: There are no ulcer or rashes noted. Psychiatric: The patient has normal affect. Cardiovascular: Palpable pedal pulses bilaterally with 23+ pitting edema  Diagnostic Studies: Ultrasound has been ordered and reviewed. She has evidence of femoral reflux bilaterally as well as proximal superficial femoral reflux with vein  diameters greater than 5 mm  Assessment:  Venous insufficiency bilateral Plan: I discussed that I feel her symptoms are related to both deep and superficial venous insufficiency. I have recommended placing her in a thigh-high 20-30 mm compression stockings to see if she gets any benefit from these. She potentially would be a candidate for laser ablation of her saphenous vein in the proximal leg where it is incompetent. I told her that this probably would not correct her symptoms but could certainly make her leg swelling improved. We also stressed the need for leg elevation. She'll followup with Dr. Hart Rochester  in 3 months     V. Charlena Cross, M.D. [facility] [place] Office: [phone] Pager:  [phone]

## 2012-11-04 ENCOUNTER — Ambulatory Visit (INDEPENDENT_AMBULATORY_CARE_PROVIDER_SITE_OTHER): Payer: Medicare Other | Admitting: Pharmacist Clinician (PhC)/ Clinical Pharmacy Specialist

## 2012-11-04 VITALS — BP 130/80 | HR 72

## 2012-11-04 DIAGNOSIS — Z7901 Long term (current) use of anticoagulants: Secondary | ICD-10-CM

## 2012-11-04 DIAGNOSIS — I4891 Unspecified atrial fibrillation: Secondary | ICD-10-CM

## 2012-11-04 DIAGNOSIS — I4819 Other persistent atrial fibrillation: Secondary | ICD-10-CM

## 2012-11-04 LAB — POCT INR: INR: 3.2

## 2012-11-04 IMAGING — DX DG ABD PORTABLE 1V
1 series · 1 of 1 positions shown · non-contrast
Comparison: CT abdomen pelvis 07/21/2011 and earlier.

CLINICAL DATA: 80-year-old female with abdominal pain and diarrhea.

PORTABLE ABDOMEN - 1 VIEW

[AP]
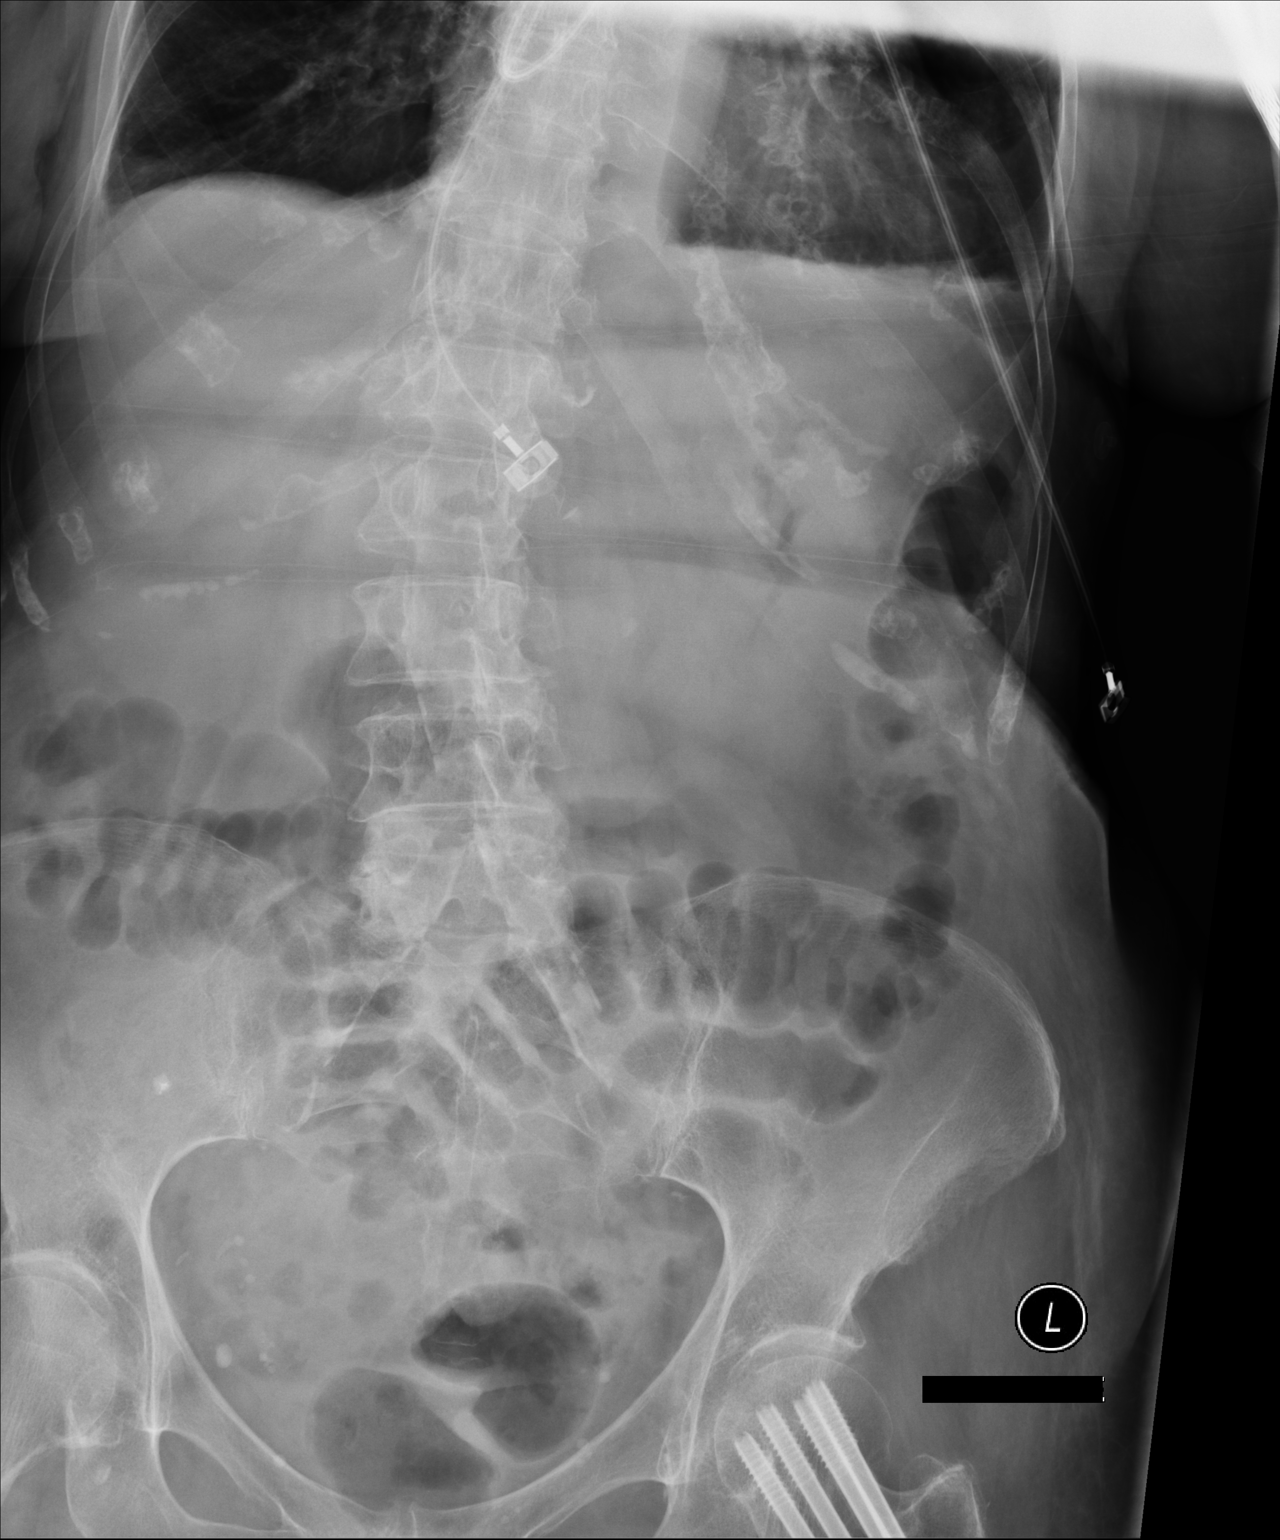

[1 of 1 positions shown; findings below may reference images not displayed]

FINDINGS: Portable AP view at 9336 hours. Nonobstructed bowel gas
pattern.  Stable postoperative changes to the visualized proximal
left femur. Stable visualized osseous structures.  Probable small
bilateral pleural effusions.  Vascular calcifications.  No definite
pneumoperitoneum on this supine view.  Stable right
nephrocalcinosis.
IMPRESSION: Nonobstructed bowel gas pattern.  Probable small pleural effusions.

## 2012-11-21 ENCOUNTER — Encounter: Payer: Self-pay | Admitting: *Deleted

## 2012-11-25 ENCOUNTER — Ambulatory Visit (INDEPENDENT_AMBULATORY_CARE_PROVIDER_SITE_OTHER): Payer: Medicare Other | Admitting: Cardiovascular Disease

## 2012-11-25 ENCOUNTER — Ambulatory Visit (INDEPENDENT_AMBULATORY_CARE_PROVIDER_SITE_OTHER): Payer: Medicare Other | Admitting: Pharmacist Clinician (PhC)/ Clinical Pharmacy Specialist

## 2012-11-25 ENCOUNTER — Ambulatory Visit: Payer: Medicare Other | Admitting: Pharmacist Clinician (PhC)/ Clinical Pharmacy Specialist

## 2012-11-25 VITALS — BP 122/66 | Ht 63.0 in | Wt 145.7 lb

## 2012-11-25 DIAGNOSIS — I4891 Unspecified atrial fibrillation: Secondary | ICD-10-CM

## 2012-11-25 DIAGNOSIS — I872 Venous insufficiency (chronic) (peripheral): Secondary | ICD-10-CM

## 2012-11-25 DIAGNOSIS — I4819 Other persistent atrial fibrillation: Secondary | ICD-10-CM

## 2012-11-25 DIAGNOSIS — Z7901 Long term (current) use of anticoagulants: Secondary | ICD-10-CM

## 2012-11-25 NOTE — Patient Instructions (Signed)
Will make an appointment with Dr Rennis Golden     Your physician wants you to follow-up in 6 months WITH  Dr Royann Shivers You will receive a reminder letter in the mail two months in advance. If you don't receive a letter, please call our office to schedule the follow-up appointment.

## 2012-11-26 ENCOUNTER — Encounter: Payer: Self-pay | Admitting: Cardiovascular Disease

## 2012-11-26 DIAGNOSIS — I872 Venous insufficiency (chronic) (peripheral): Secondary | ICD-10-CM | POA: Insufficient documentation

## 2012-11-26 NOTE — Assessment & Plan Note (Signed)
After Natalie [name] and her daughter left the office I located the records of the patient's previous venous evaluation. She was actually seen by Dr. Myra Gianotti at [facility]. Her lower extremity venous Doppler study shows that there is insufficiency of both the deep venous valvular system as well as a superficial saphenous system. He indeed recommended that she wear the thigh-high compression stockings. He mentioned that ablation of the superficial venous system is feasible it might provide limited benefit but that her symptoms would persist secondary to the deep venous system valvular insufficiency.

## 2012-11-26 NOTE — Assessment & Plan Note (Signed)
She has successfully resumed warfarin anticoagulation without recurrent GI bleeding

## 2012-11-26 NOTE — Progress Notes (Signed)
Patient ID: Natalie [name], female   DOB: [birth year], 79 y.o.   MRN: [id number]     Reason for office visit Worsening edema  Natalie [name] is 79 years old and has permanent atrial fibrillation, COPD and recently has been troubled by worsening bilateral lower extremity edema. In April of this year she had acute upper GI bleeding secondary to gastric ulcer. In May she had respiratory failure that required intubation.  She has always had some degree of edema but the problem has become much worse over the last few months. She had been outpatient lower shunting the venous duplex ultrasound, but does not remember where this was done. She was told that her deep veins are open but that she has severe reflux in the superficial venous system. She was told to wear thigh high compression stockings for 90 days and then return to discuss venous ablation. She states that she is unable to put on the thigh-high stockings. Even the knee-high stockings are a big challenge for her. Diuretics have not helped with her edema.  Her daughter did not accompany her to the evaluation for her lower extremity venous insufficiency. The patient has limited recollection of the findings of the workup, other than the recommendation to wear thigh-high stockings.    Allergies  Allergen Reactions  . Levofloxacin Other (See Comments)    REACTION: unspecified per [facility]    Current Outpatient Prescriptions  Medication Sig Dispense Refill  . ALPRAZolam (XANAX) 0.25 MG tablet Take 0.25 mg by mouth every 8 (eight) hours.      Marland Kitchen ascorbic acid (VITAMIN C) 500 MG tablet Take 1,000 mg by mouth daily.      . benazepril (LOTENSIN) 40 MG tablet Take 40 mg by mouth daily.      . bisoprolol (ZEBETA) 5 MG tablet Take 5 mg by mouth daily.      . calcium-vitamin D (OSCAL WITH D) 500-200 MG-UNIT per tablet Take 1 tablet by mouth 3 (three) times daily.      Marland Kitchen dicyclomine (BENTYL) 10 MG capsule Take 10 mg by mouth 4 (four) times daily -  before  meals and at bedtime.      . DULoxetine (CYMBALTA) 60 MG capsule Take 120 mg by mouth daily.       Marland Kitchen HYDROcodone-acetaminophen (NORCO/VICODIN) 5-325 MG per tablet Take 1 tablet by mouth 2 (two) times daily.      . lansoprazole (PREVACID) 30 MG capsule Take 30 mg by mouth 2 (two) times daily before a meal.      . levothyroxine (SYNTHROID, LEVOTHROID) 88 MCG tablet Take 88 mcg by mouth daily before breakfast.      . tamsulosin (FLOMAX) 0.4 MG CAPS Take 0.4 mg by mouth daily.      Marland Kitchen warfarin (COUMADIN) 0.5 mg TABS Take 0.5 mg by mouth daily.      . furosemide (LASIX) 40 MG tablet Take 40 mg by mouth daily.      Marland Kitchen spironolactone (ALDACTONE) 25 MG tablet 25 mg daily.        No current facility-administered medications for this visit.    Past Medical History  Diagnosis Date  . Osteoporosis   . Hypertension   . Hypothyroidism   . Lower GI bleeding 07/21/11  . Atrial fibrillation   . COPD (chronic obstructive pulmonary disease)   . Pneumonia   . Shortness of breath 07/21/11    "anytime"  . Sinus headache 07/21/11    "all the time"  . Bladder infection, chronic   .  Arthritis     "everywhere"  . Anxiety   . Chronic lower back pain   . Chronic abdominal pain   . Renal mass, left 2013  . PUD (peptic ulcer disease)   . Chronic cystitis   . Trigeminal neuralgia     /E-chart  . Chronic kidney disease   . Anemia     Past Surgical History  Procedure Laterality Date  . Vaginal hysterectomy      partial  . Cataract extraction w/ intraocular lens  implant, bilateral    . Percutaneous pinning femoral neck fracture  11/2003    w/closed reduction ; left/E-chart  . Laceration repair  11/2003    left eyebrow  . Bilateral salpingoophorectomy  12/2005    lap/E-chart  . Cholecystectomy  1960  . Appendectomy      presumed/E-chart  . Esophagogastroduodenoscopy  07/23/2011    Procedure: ESOPHAGOGASTRODUODENOSCOPY (EGD);  Surgeon: Graylin Shiver, MD;  Location[facility] ENDOSCOPY;  Service: Endoscopy;   Laterality: N/A;    No family history on file.  History   Social History  . Marital Status: Widowed    Spouse Name: N/A    Number of Children: N/A  . Years of Education: N/A   Occupational History  . Not on file.   Social History Main Topics  . Smoking status: Former Smoker -- 1.00 packs/day    Types: Cigarettes    Quit date: 09/11/2009  . Smokeless tobacco: Never Used     Comment: 07/21/11 "can't remember when I quit smoking"  . Alcohol Use: 1.2 oz/week    2 Glasses of wine per week     Comment: 07/21/11 "seldom drink"  . Drug Use: No  . Sexual Activity: No   Other Topics Concern  . Not on file   Social History Narrative  . No narrative on file    Review of systems:  Chest severe low back pain related to lumbar spinal stenosis. She has very weak legs. The patient specifically denies any chest pain at rest or with exertion, dyspnea at rest or with exertion, orthopnea, paroxysmal nocturnal dyspnea, syncope, palpitations, focal neurological deficits, intermittent claudication, unexplained weight gain, cough, hemoptysis or wheezing.  The patient also denies abdominal pain, nausea, vomiting, dysphagia, diarrhea, constipation, polyuria, polydipsia, dysuria, hematuria, frequency, urgency, abnormal bleeding or bruising, fever, chills, unexpected weight changes, mood swings, change in skin or hair texture, change in voice quality, auditory or visual problems, allergic reactions or rashes, new musculoskeletal complaints other than usual "aches and pains".   PHYSICAL EXAM BP 122/66  Ht 5\' 3"  (1.6 m)  Wt 145 lb 11.2 oz (66.089 kg)  BMI 25.82 kg/m2  General: Alert, oriented x3, no distress Head: no evidence of trauma, PERRL, EOMI, no exophtalmos or lid lag, no myxedema, no xanthelasma; normal ears, nose and oropharynx Neck: normal jugular venous pulsations and no hepatojugular reflux; brisk carotid pulses without delay and no carotid bruits Chest: clear to auscultation, no signs  of consolidation by percussion or palpation, normal fremitus, symmetrical and full respiratory excursions Cardiovascular: normal position and quality of the apical impulse, irregular rhythm, normal first and second heart sounds, no murmurs, rubs or gallops Abdomen: no tenderness or distention, no masses by palpation, no abnormal pulsatility or arterial bruits, normal bowel sounds, no hepatosplenomegaly Extremities: no clubbing, cyanosis; there is 3+ bilateral pitting symmetrical edema to the level of the knees; 2+ radial, ulnar and brachial pulses bilaterally; 2+ right femoral, posterior tibial and dorsalis pedis pulses; 2+ left femoral, posterior tibial  and dorsalis pedis pulses; no subclavian or femoral bruits Neurological: grossly nonfocal   EKG: Atrial fibrillation, right bundle branch block, unchanged  Lipid Panel  No results found for this basename: chol, trig, hdl, cholhdl, vldl, ldlcalc    BMET    Component Value Date/Time   NA 134* 08/05/2012 0127   K 3.3* 08/05/2012 0127   CL 94* 08/05/2012 0127   CO2 31 08/05/2012 0127   GLUCOSE 111* 08/05/2012 0127   BUN 12 08/05/2012 0127   CREATININE 0.60 08/05/2012 0127   CALCIUM 9.0 08/05/2012 0127   GFRNONAA 84* 08/05/2012 0127   GFRAA >90 08/05/2012 0127     ASSESSMENT AND PLAN Atrial fibrillation, persistent with asymptomatic pauses She has successfully resumed warfarin anticoagulation without recurrent GI bleeding  Peripheral venous insufficiency After Natalie [name] and her daughter left the office I located the records of the patient's previous venous evaluation. She was actually seen by Dr. Myra Gianotti at [facility]. Her lower extremity venous Doppler study shows that there is insufficiency of both the deep venous valvular system as well as a superficial saphenous system. He indeed recommended that she wear the thigh-high compression stockings. He mentioned that ablation of the superficial venous system is feasible it might provide limited benefit  but that her symptoms would persist secondary to the deep venous system valvular insufficiency.   I will call her back with this information. Conservative management may be the best option Orders Placed This Encounter  Procedures  . EKG 12-Lead   Meds ordered this encounter  Medications  . spironolactone (ALDACTONE) 25 MG tablet    Sig: 25 mg daily.     Junious Silk, MD, [facility] [phone] office [phone] pager

## 2012-11-30 ENCOUNTER — Encounter (INDEPENDENT_AMBULATORY_CARE_PROVIDER_SITE_OTHER): Payer: Medicare Other | Admitting: *Deleted

## 2012-11-30 DIAGNOSIS — I83893 Varicose veins of bilateral lower extremities with other complications: Secondary | ICD-10-CM

## 2012-12-01 ENCOUNTER — Telehealth: Payer: Self-pay | Admitting: Cardiovascular Disease

## 2012-12-01 NOTE — Telephone Encounter (Signed)
Message forwarded to Dr. Croitoru/Barbara, CMA 

## 2012-12-01 NOTE — Telephone Encounter (Signed)
Danielle from [facility] imaging calling to check on the status of a clearance to get an injection that was faxed on 8/29.

## 2012-12-02 ENCOUNTER — Encounter: Payer: Self-pay | Admitting: Cardiovascular Disease

## 2012-12-02 ENCOUNTER — Encounter: Payer: Self-pay | Admitting: *Deleted

## 2012-12-02 DIAGNOSIS — I83893 Varicose veins of bilateral lower extremities with other complications: Secondary | ICD-10-CM

## 2012-12-02 NOTE — Telephone Encounter (Signed)
Letter faxed to [facility] [phone]

## 2012-12-30 ENCOUNTER — Ambulatory Visit: Payer: Medicare Other | Admitting: Internal Medicine

## 2012-12-30 ENCOUNTER — Ambulatory Visit: Payer: Medicare Other | Admitting: Pharmacist Clinician (PhC)/ Clinical Pharmacy Specialist

## 2012-12-30 ENCOUNTER — Telehealth: Payer: Self-pay | Admitting: Cardiovascular Disease

## 2013-01-18 ENCOUNTER — Telehealth: Payer: Self-pay | Admitting: Cardiovascular Disease

## 2013-01-18 DIAGNOSIS — N390 Urinary tract infection, site not specified: Secondary | ICD-10-CM

## 2013-01-18 NOTE — Telephone Encounter (Signed)
Needs to talk to nurse . Primary Care Dr told her to call us.  Feet swollen

## 2013-01-19 NOTE — Telephone Encounter (Signed)
Returning your call. °

## 2013-01-19 NOTE — Telephone Encounter (Signed)
Labs ordered.  Will inform pt of advice when call returned.

## 2013-01-19 NOTE — Telephone Encounter (Signed)
Pt also stated she does not have transportation and would have to call someone if she has to be seen.    Paper chart# [id number] on Dr. Erin Hearing cart for review.

## 2013-01-19 NOTE — Telephone Encounter (Signed)
Returned call.  Left message to call back before 4pm.  

## 2013-01-19 NOTE — Telephone Encounter (Signed)
Returned Hospital doctor phone call  Can call home or cell no.

## 2013-01-19 NOTE — Telephone Encounter (Signed)
Returned call and pt verified x 2.  Pt stated she is having a terrible time w/ her bladder.  Stated she saw her PCP and she is on Lasix.  Pt stated she cannot control her bladder.  Pt also c/o feet and ankles are swollen.  Stated she had stopped Lasix for "a long time" and is back on it now.  Stated she was told to take Lasix 40 mg.  RN unable to get a good history or complaint from pt r/t her replying "I don't know honey" to questions.  Pt informed chart will be reviewed and Dr. Royann Shivers will be notified for further instructions.  Pt verbalized understanding and agreed w/ plan.  Pt thinks her last appt w/ any provider was 2-3 weeks ago.  Pt did also state she saw a urologist.  Paper chart requested.

## 2013-01-19 NOTE — Telephone Encounter (Signed)
Returned call and informed pt per instructions by MD.  Pt verbalized understanding and stated she cannot get labs done today.  Stated she doesn't have transportation.  Pt stated she has an appt w/ her urologist tomorrow and has made a call to her PCP's NP (PCP out of office) to find out about Lasix.  Pt informed RN unable to find where Dr. Royann Shivers rx'd Lasix.  Pt will f/u with NP at PCP's office and urology r/t Lasix and urinary concerns respectively.

## 2013-01-19 NOTE — Telephone Encounter (Signed)
Returned call.  Left message w/ female answering for pt to call back before 4pm. 

## 2013-01-19 NOTE — Telephone Encounter (Signed)
Please send UA and culture for possible UTI. Sounds like a problem for her urologist, not cardiac.

## 2013-01-23 ENCOUNTER — Telehealth: Payer: Self-pay | Admitting: Cardiovascular Disease

## 2013-01-23 NOTE — Telephone Encounter (Signed)
Message forwarded to K. Alvstad, PharmD.  

## 2013-01-23 NOTE — Telephone Encounter (Signed)
LMOM for Natalie [name], ok to draw PT/INR on Natalie [name] at next [facility] visit.

## 2013-01-23 NOTE — Telephone Encounter (Signed)
Wants to know when she does her home visit if you want her to draw her INR?

## 2013-01-24 LAB — POCT INR: INR: 1.8

## 2013-01-26 ENCOUNTER — Telehealth: Payer: Self-pay | Admitting: Pharmacist Clinician (PhC)/ Clinical Pharmacy Specialist

## 2013-01-26 NOTE — Telephone Encounter (Signed)
Pt asked Belenda Cruise to return her call.    Called back Thursday pm - pt states HH drew INR on Tuesday, reading was "103".  What should she do about med?  LMOM for Roxanne with [facility], gave her my cell # to call, as we have not received results

## 2013-01-30 ENCOUNTER — Encounter: Payer: Self-pay | Admitting: Vascular Surgery

## 2013-01-31 ENCOUNTER — Ambulatory Visit (INDEPENDENT_AMBULATORY_CARE_PROVIDER_SITE_OTHER): Payer: Medicare Other | Admitting: Pharmacist Clinician (PhC)/ Clinical Pharmacy Specialist

## 2013-01-31 ENCOUNTER — Ambulatory Visit (INDEPENDENT_AMBULATORY_CARE_PROVIDER_SITE_OTHER): Payer: Medicare Other | Admitting: Vascular Surgery

## 2013-01-31 ENCOUNTER — Encounter (INDEPENDENT_AMBULATORY_CARE_PROVIDER_SITE_OTHER): Payer: Self-pay

## 2013-01-31 ENCOUNTER — Ambulatory Visit: Payer: Self-pay | Admitting: Pharmacist Clinician (PhC)/ Clinical Pharmacy Specialist

## 2013-01-31 ENCOUNTER — Encounter: Payer: Self-pay | Admitting: Vascular Surgery

## 2013-01-31 VITALS — BP 161/91 | HR 106 | Ht 63.0 in | Wt 145.0 lb

## 2013-01-31 DIAGNOSIS — Z7901 Long term (current) use of anticoagulants: Secondary | ICD-10-CM

## 2013-01-31 DIAGNOSIS — I4891 Unspecified atrial fibrillation: Secondary | ICD-10-CM

## 2013-01-31 DIAGNOSIS — I4819 Other persistent atrial fibrillation: Secondary | ICD-10-CM

## 2013-01-31 DIAGNOSIS — M7989 Other specified soft tissue disorders: Secondary | ICD-10-CM

## 2013-01-31 LAB — POCT INR: INR: 1.9

## 2013-01-31 NOTE — Telephone Encounter (Signed)
See anticoag note

## 2013-01-31 NOTE — Progress Notes (Signed)
Subjective:     Patient ID: Natalie [name], female   DOB: [birth year], 79 y.o.   MRN: [id number]  HPI this 79 year old female returns for further discussion regarding her bilateral venous insufficiency. She was evaluated by Dr. Myra Gianotti 3 months ago. Her main complaint is pain in both lower extremities from hip to the thigh area and chronic edema. The edema has actually improved significantly since she discontinued Cymbalta which she was taking at the time she saw Dr. Myra Gianotti. She has no history of DVT or thrombophlebitis. She tries to wear elastic compression stockings which did not help the pain.  Past Medical History  Diagnosis Date  . Osteoporosis   . Hypertension   . Hypothyroidism   . Lower GI bleeding 07/21/11  . Atrial fibrillation   . COPD (chronic obstructive pulmonary disease)   . Pneumonia   . Shortness of breath 07/21/11    "anytime"  . Sinus headache 07/21/11    "all the time"  . Bladder infection, chronic   . Arthritis     "everywhere"  . Anxiety   . Chronic lower back pain   . Chronic abdominal pain   . Renal mass, left 2013  . PUD (peptic ulcer disease)   . Chronic cystitis   . Trigeminal neuralgia     /E-chart  . Chronic kidney disease   . Anemia     History  Substance Use Topics  . Smoking status: Former Smoker -- 1.00 packs/day    Types: Cigarettes    Quit date: 09/11/2009  . Smokeless tobacco: Never Used     Comment: 07/21/11 "can't remember when I quit smoking"  . Alcohol Use: 1.2 oz/week    2 Glasses of wine per week     Comment: 07/21/11 "seldom drink"    No family history on file.  Allergies  Allergen Reactions  . Levofloxacin Other (See Comments)    REACTION: unspecified per [facility]    Current outpatient prescriptions:ALPRAZolam (XANAX) 0.25 MG tablet, Take 0.25 mg by mouth every 8 (eight) hours., Disp: , Rfl: ;  ascorbic acid (VITAMIN C) 500 MG tablet, Take 1,000 mg by mouth daily., Disp: , Rfl: ;  benazepril (LOTENSIN) 40 MG tablet, Take 40 mg by  mouth daily., Disp: , Rfl: ;  bisoprolol (ZEBETA) 5 MG tablet, Take 5 mg by mouth daily., Disp: , Rfl:  calcium-vitamin D (OSCAL WITH D) 500-200 MG-UNIT per tablet, Take 1 tablet by mouth 3 (three) times daily., Disp: , Rfl: ;  dicyclomine (BENTYL) 10 MG capsule, Take 10 mg by mouth 4 (four) times daily -  before meals and at bedtime., Disp: , Rfl: ;  DULoxetine (CYMBALTA) 60 MG capsule, Take 120 mg by mouth daily. , Disp: , Rfl: ;  furosemide (LASIX) 40 MG tablet, Take 40 mg by mouth daily., Disp: , Rfl:  HYDROcodone-acetaminophen (NORCO/VICODIN) 5-325 MG per tablet, Take 1 tablet by mouth 2 (two) times daily., Disp: , Rfl: ;  lansoprazole (PREVACID) 30 MG capsule, Take 30 mg by mouth 2 (two) times daily before a meal., Disp: , Rfl: ;  levothyroxine (SYNTHROID, LEVOTHROID) 88 MCG tablet, Take 88 mcg by mouth daily before breakfast., Disp: , Rfl: ;  spironolactone (ALDACTONE) 25 MG tablet, 25 mg daily. , Disp: , Rfl:  tamsulosin (FLOMAX) 0.4 MG CAPS, Take 0.4 mg by mouth daily., Disp: , Rfl: ;  warfarin (COUMADIN) 0.5 mg TABS, Take 0.5 mg by mouth daily., Disp: , Rfl:   BP 161/91  Pulse 106  Ht 5\' 3"  (  1.6 m)  Wt 145 lb (65.772 kg)  BMI 25.69 kg/m2  SpO2 99%  Body mass index is 25.69 kg/(m^2).          Review of Systems chest pain, dyspnea on exertion. He is nonambulatory for the most part. Is in a wheelchair today.    Objective:   Physical Exam BP 161/91  Pulse 106  Ht 5\' 3"  (1.6 m)  Wt 145 lb (65.772 kg)  BMI 25.69 kg/m2  SpO2 99%  General well-developed elderly female no apparent distress alert and oriented x3 in a wheelchair company by her daughter Lungs no rhonchi or wheezing Both lower extremities have 1+ edema from knee to foot with no bulging varicosities or hyperpigmentation or ulceration noted. Patient is very immobile. Today I immaged both great saphenous veins with the SonoSite ultrasound. She does have some mild reflux in the great saphenous systems bilaterally.       Assessment:     Bilateral deep and superficial venous reflux in patient with bilateral leg pain-atypical for venous insufficiency with mild chronic edema which has improved since discontinuing Cymbalta    Plan:     .Recommend laser ablation of great saphenous system as I do not think this will affect her pain pattern which is her main complaint. Discussed this at length with patient and her daughter and would reck commend continued elevation and elastic compression

## 2013-01-31 NOTE — Progress Notes (Signed)
INR info received late, pt has next INR scheduled 11/12.  Will re-dose after that check

## 2013-02-01 ENCOUNTER — Telehealth: Payer: Self-pay | Admitting: *Deleted

## 2013-02-01 NOTE — Telephone Encounter (Signed)
Signed order for INR checks and to call Belenda Cruise with results, PharmD w/results.

## 2013-02-07 ENCOUNTER — Telehealth: Payer: Self-pay | Admitting: Pharmacist Clinician (PhC)/ Clinical Pharmacy Specialist

## 2013-02-07 NOTE — Telephone Encounter (Signed)
Spoke with Marylene Land, will have INR drawn on Thursday, as pt has MD appt (not [facility]) tomorrow)

## 2013-02-07 NOTE — Telephone Encounter (Signed)
Spoke with patient, she denies leaving message, stated it must have been her daughter.  Asked her to have daughter call back tomorrow with question.  Pt voiced understanding

## 2013-02-07 NOTE — Telephone Encounter (Signed)
When does she need her next INR drawn?

## 2013-02-07 NOTE — Telephone Encounter (Signed)
Pt LM on VM stating her daughter had a question, could we please return the call

## 2013-02-09 ENCOUNTER — Ambulatory Visit (INDEPENDENT_AMBULATORY_CARE_PROVIDER_SITE_OTHER): Payer: Medicare Other | Admitting: Pharmacist Clinician (PhC)/ Clinical Pharmacy Specialist

## 2013-02-09 DIAGNOSIS — Z7901 Long term (current) use of anticoagulants: Secondary | ICD-10-CM

## 2013-02-09 DIAGNOSIS — I4819 Other persistent atrial fibrillation: Secondary | ICD-10-CM

## 2013-02-09 DIAGNOSIS — I4891 Unspecified atrial fibrillation: Secondary | ICD-10-CM

## 2013-02-27 ENCOUNTER — Telehealth: Payer: Self-pay | Admitting: Cardiovascular Disease

## 2013-02-27 NOTE — Telephone Encounter (Signed)
Patient is having a back injection on 03/21/13.  When does she need to stop her Coumadin?

## 2013-02-27 NOTE — Telephone Encounter (Signed)
Take warfarin thru Dec 24, then hold Dec 25-29.  Restart on Dec 30 unless told otherwise by MD doing injection.  Pt voiced understanding

## 2013-02-27 NOTE — Telephone Encounter (Signed)
Forward to [facility]

## 2013-03-09 ENCOUNTER — Telehealth: Payer: Self-pay | Admitting: Pharmacist Clinician (PhC)/ Clinical Pharmacy Specialist

## 2013-03-09 NOTE — Telephone Encounter (Signed)
Pt confused about when to stop for spinal injection  3pm - LMOM to stop after Christmas Eve dose, restart on 12/30 after injection unless told otherwise.  Will call back later to confirm in person with patient

## 2013-03-09 NOTE — Telephone Encounter (Signed)
Spoke with patient, will mail her a copy of this note.    Last dose of warfarin prior to injection is December 24.  Restart warfarin on December 30, in the evening AFTER the injection (unless told otherwise by MD administering injection).  Belenda Cruise.

## 2013-03-14 ENCOUNTER — Telehealth: Payer: Self-pay | Admitting: Cardiovascular Disease

## 2013-03-14 NOTE — Telephone Encounter (Signed)
Wants to know when is she supposed to stop her Warfrain

## 2013-03-21 ENCOUNTER — Encounter: Payer: Self-pay | Admitting: Internal Medicine

## 2013-03-21 ENCOUNTER — Ambulatory Visit (INDEPENDENT_AMBULATORY_CARE_PROVIDER_SITE_OTHER): Payer: Medicare Other | Admitting: Pharmacist Clinician (PhC)/ Clinical Pharmacy Specialist

## 2013-03-21 DIAGNOSIS — I4819 Other persistent atrial fibrillation: Secondary | ICD-10-CM

## 2013-03-21 DIAGNOSIS — Z7901 Long term (current) use of anticoagulants: Secondary | ICD-10-CM

## 2013-03-21 DIAGNOSIS — I4891 Unspecified atrial fibrillation: Secondary | ICD-10-CM

## 2013-03-28 ENCOUNTER — Encounter: Payer: Self-pay | Admitting: Internal Medicine

## 2013-04-11 ENCOUNTER — Other Ambulatory Visit: Payer: Self-pay | Admitting: Cardiovascular Disease

## 2013-04-12 ENCOUNTER — Telehealth: Payer: Self-pay | Admitting: Cardiovascular Disease

## 2013-04-12 NOTE — Telephone Encounter (Signed)
Per answering service from yesterday at 5:15: Daughter went to pick up her Warfin prescription and the pharmacist said she need to contact her doctor.

## 2013-04-13 NOTE — Telephone Encounter (Signed)
Problem resolved at drugstore

## 2013-04-26 ENCOUNTER — Telehealth: Payer: Self-pay | Admitting: *Deleted

## 2013-04-26 NOTE — Telephone Encounter (Signed)
Signed order for SN faxed to [facility]. 

## 2013-04-28 ENCOUNTER — Ambulatory Visit (INDEPENDENT_AMBULATORY_CARE_PROVIDER_SITE_OTHER): Payer: Medicare Other | Admitting: Pharmacist Clinician (PhC)/ Clinical Pharmacy Specialist

## 2013-04-28 DIAGNOSIS — I4891 Unspecified atrial fibrillation: Secondary | ICD-10-CM

## 2013-04-28 DIAGNOSIS — Z7901 Long term (current) use of anticoagulants: Secondary | ICD-10-CM

## 2013-04-28 DIAGNOSIS — I4819 Other persistent atrial fibrillation: Secondary | ICD-10-CM

## 2013-04-28 LAB — POCT INR: INR: 3.8

## 2013-05-04 ENCOUNTER — Ambulatory Visit (INDEPENDENT_AMBULATORY_CARE_PROVIDER_SITE_OTHER): Payer: Medicare Other | Admitting: Pharmacist Clinician (PhC)/ Clinical Pharmacy Specialist

## 2013-05-04 DIAGNOSIS — I4819 Other persistent atrial fibrillation: Secondary | ICD-10-CM

## 2013-05-04 DIAGNOSIS — I4891 Unspecified atrial fibrillation: Secondary | ICD-10-CM

## 2013-05-04 DIAGNOSIS — Z7901 Long term (current) use of anticoagulants: Secondary | ICD-10-CM

## 2013-05-11 ENCOUNTER — Ambulatory Visit (INDEPENDENT_AMBULATORY_CARE_PROVIDER_SITE_OTHER): Payer: Medicare Other | Admitting: Pharmacist Clinician (PhC)/ Clinical Pharmacy Specialist

## 2013-05-11 DIAGNOSIS — I4819 Other persistent atrial fibrillation: Secondary | ICD-10-CM

## 2013-05-11 DIAGNOSIS — I4891 Unspecified atrial fibrillation: Secondary | ICD-10-CM

## 2013-05-11 DIAGNOSIS — Z7901 Long term (current) use of anticoagulants: Secondary | ICD-10-CM

## 2013-05-11 LAB — POCT INR: INR: 3.5

## 2013-05-19 ENCOUNTER — Ambulatory Visit (INDEPENDENT_AMBULATORY_CARE_PROVIDER_SITE_OTHER): Payer: Medicare Other | Admitting: Pharmacist Clinician (PhC)/ Clinical Pharmacy Specialist

## 2013-05-19 DIAGNOSIS — I4819 Other persistent atrial fibrillation: Secondary | ICD-10-CM

## 2013-05-19 DIAGNOSIS — Z7901 Long term (current) use of anticoagulants: Secondary | ICD-10-CM

## 2013-05-19 DIAGNOSIS — I4891 Unspecified atrial fibrillation: Secondary | ICD-10-CM

## 2013-05-19 LAB — POCT INR: INR: 4.8

## 2013-05-22 ENCOUNTER — Telehealth: Payer: Self-pay | Admitting: Pharmacist Clinician (PhC)/ Clinical Pharmacy Specialist

## 2013-05-22 NOTE — Telephone Encounter (Signed)
Pt forgot warfarin dose, returned call and gave info.

## 2013-05-25 ENCOUNTER — Ambulatory Visit (INDEPENDENT_AMBULATORY_CARE_PROVIDER_SITE_OTHER): Payer: Medicare Other | Admitting: Pharmacist Clinician (PhC)/ Clinical Pharmacy Specialist

## 2013-05-25 DIAGNOSIS — Z7901 Long term (current) use of anticoagulants: Secondary | ICD-10-CM

## 2013-05-25 DIAGNOSIS — I4891 Unspecified atrial fibrillation: Secondary | ICD-10-CM

## 2013-05-25 DIAGNOSIS — I4819 Other persistent atrial fibrillation: Secondary | ICD-10-CM

## 2013-05-25 LAB — POCT INR: INR: 1.9

## 2013-06-01 ENCOUNTER — Ambulatory Visit (INDEPENDENT_AMBULATORY_CARE_PROVIDER_SITE_OTHER): Payer: Medicare Other | Admitting: Pharmacist Clinician (PhC)/ Clinical Pharmacy Specialist

## 2013-06-01 DIAGNOSIS — I4891 Unspecified atrial fibrillation: Secondary | ICD-10-CM

## 2013-06-01 DIAGNOSIS — Z7901 Long term (current) use of anticoagulants: Secondary | ICD-10-CM

## 2013-06-01 DIAGNOSIS — I4819 Other persistent atrial fibrillation: Secondary | ICD-10-CM

## 2013-06-01 LAB — POCT INR: INR: 3

## 2013-06-08 ENCOUNTER — Ambulatory Visit (INDEPENDENT_AMBULATORY_CARE_PROVIDER_SITE_OTHER): Payer: Medicare Other | Admitting: Pharmacist Clinician (PhC)/ Clinical Pharmacy Specialist

## 2013-06-08 DIAGNOSIS — I4819 Other persistent atrial fibrillation: Secondary | ICD-10-CM

## 2013-06-08 DIAGNOSIS — I4891 Unspecified atrial fibrillation: Secondary | ICD-10-CM

## 2013-06-08 DIAGNOSIS — Z7901 Long term (current) use of anticoagulants: Secondary | ICD-10-CM

## 2013-06-08 LAB — POCT INR: INR: 1.5

## 2013-06-12 ENCOUNTER — Other Ambulatory Visit: Payer: Self-pay | Admitting: *Deleted

## 2013-06-12 MED ORDER — BENAZEPRIL HCL 40 MG PO TABS: 40.0000 mg | ORAL_TABLET | Freq: Every day | ORAL | Status: DC

## 2013-06-12 NOTE — Telephone Encounter (Signed)
Rx refill sent to pharmacy. 

## 2013-06-28 ENCOUNTER — Ambulatory Visit: Payer: Medicare Other | Admitting: Pharmacist Clinician (PhC)/ Clinical Pharmacy Specialist

## 2013-06-30 ENCOUNTER — Ambulatory Visit: Payer: Medicare Other | Admitting: Pharmacist Clinician (PhC)/ Clinical Pharmacy Specialist

## 2013-07-05 ENCOUNTER — Ambulatory Visit (INDEPENDENT_AMBULATORY_CARE_PROVIDER_SITE_OTHER): Payer: Medicare Other | Admitting: Podiatry

## 2013-07-05 ENCOUNTER — Encounter: Payer: Self-pay | Admitting: Podiatry

## 2013-07-05 VITALS — BP 109/76 | HR 74 | Resp 16

## 2013-07-05 DIAGNOSIS — L600 Ingrowing nail: Secondary | ICD-10-CM

## 2013-07-05 DIAGNOSIS — B351 Tinea unguium: Secondary | ICD-10-CM

## 2013-07-05 DIAGNOSIS — M79609 Pain in unspecified limb: Secondary | ICD-10-CM

## 2013-07-05 NOTE — Patient Instructions (Signed)

## 2013-07-05 NOTE — Progress Notes (Signed)
   Subjective:    Patient ID: Natalie [name], female    DOB: [birth year], 80 y.o.   MRN: [id number]  HPI Comments: "My toe is sore"  Patient states she has pain in 1st toe right for 4-5 months. The tip of toe is red and swollen. The toenail is thick and discolored. Been soaking - no help.  Toe Pain       Review of Systems  HENT: Positive for hearing loss and sinus pressure.   Respiratory: Positive for shortness of breath.   Genitourinary: Positive for frequency.  Musculoskeletal: Positive for arthralgias, back pain and gait problem.  Neurological: Positive for weakness and headaches.  Hematological: Bruises/bleeds easily.  Psychiatric/Behavioral: The patient is nervous/anxious.   All other systems reviewed and are negative.      Objective:   Physical Exam        Assessment & Plan:

## 2013-07-05 NOTE — Progress Notes (Signed)
Subjective:     Patient ID: Natalie [name], female   DOB: January 22, 1931, 80 y.o.   MRN: [id number]  Toe Pain    patient presents stating my right big toe nail is painful and the rest my nails are thick and I cannot cut them myself and they can become tender. The right nail has been thick and loose and she has trouble wearing shoes   Review of Systems  All other systems reviewed and are negative.      Objective:   Physical Exam  Nursing note and vitals reviewed. Constitutional: She is oriented to person, place, and time.  Cardiovascular: Intact distal pulses.   Musculoskeletal: Normal range of motion.  Neurological: She is oriented to person, place, and time.  Skin: Skin is warm.   neurovascular status intact with muscle strength adequate and normal range of motion subtalar joint. Patient has well-perfused digits and is found to have a thick dystrophic big toenail right that's loose and painful when pressed and remaining nails are thick and painful when pressed with brittle like discoloration     Assessment:     Damage hallux nail right with pain and mycotic nail infection of remaining nails    Plan:     H&P reviewed with patient and infiltrated the right hallux 60 mg Xylocaine Marcaine mixture remove the hallux nail and applied sterile dressing. Debrided remaining nails at this time and reappoint as needed

## 2013-07-10 ENCOUNTER — Ambulatory Visit: Payer: Self-pay | Admitting: Podiatry

## 2013-07-13 NOTE — Telephone Encounter (Signed)
Closed encounter °

## 2013-07-14 ENCOUNTER — Ambulatory Visit (INDEPENDENT_AMBULATORY_CARE_PROVIDER_SITE_OTHER): Payer: Medicare Other | Admitting: Pharmacist Clinician (PhC)/ Clinical Pharmacy Specialist

## 2013-07-14 DIAGNOSIS — I4819 Other persistent atrial fibrillation: Secondary | ICD-10-CM

## 2013-07-14 DIAGNOSIS — I4891 Unspecified atrial fibrillation: Secondary | ICD-10-CM

## 2013-07-14 DIAGNOSIS — M79609 Pain in unspecified limb: Secondary | ICD-10-CM

## 2013-07-14 DIAGNOSIS — M79604 Pain in right leg: Secondary | ICD-10-CM

## 2013-07-14 DIAGNOSIS — Z7901 Long term (current) use of anticoagulants: Secondary | ICD-10-CM

## 2013-07-14 DIAGNOSIS — M79605 Pain in left leg: Secondary | ICD-10-CM

## 2013-07-14 LAB — POCT INR: INR: 1.8

## 2013-07-24 ENCOUNTER — Other Ambulatory Visit: Payer: Self-pay | Admitting: Urology

## 2013-07-24 DIAGNOSIS — N2889 Other specified disorders of kidney and ureter: Secondary | ICD-10-CM

## 2013-07-31 ENCOUNTER — Ambulatory Visit: Payer: Medicare Other | Admitting: Pharmacist Clinician (PhC)/ Clinical Pharmacy Specialist

## 2013-08-08 ENCOUNTER — Ambulatory Visit: Payer: Medicare Other | Admitting: Pharmacist Clinician (PhC)/ Clinical Pharmacy Specialist

## 2013-08-10 ENCOUNTER — Telehealth: Payer: Self-pay | Admitting: *Deleted

## 2013-08-10 NOTE — Telephone Encounter (Signed)
Faxed clearance to hold coumadin 5 days prior to joint injection to Dr. Neomia Dear.

## 2013-08-11 ENCOUNTER — Ambulatory Visit: Payer: Medicare Other | Admitting: Pharmacist Clinician (PhC)/ Clinical Pharmacy Specialist

## 2013-08-15 ENCOUNTER — Inpatient Hospital Stay
Admission: RE | Admit: 2013-08-15 | Discharge: 2013-08-15 | Disposition: A | Discharge status: Home / Self Care | Payer: Medicare Other | Source: Ambulatory Visit | Attending: Urology | Admitting: Urology

## 2013-08-18 ENCOUNTER — Other Ambulatory Visit: Payer: Self-pay | Admitting: Sports Medicine

## 2013-08-18 DIAGNOSIS — M25551 Pain in right hip: Secondary | ICD-10-CM

## 2013-08-19 ENCOUNTER — Ambulatory Visit
Admission: RE | Admit: 2013-08-19 | Discharge: 2013-08-19 | Disposition: A | Discharge status: Home / Self Care | Payer: Medicare Other | Source: Ambulatory Visit | Attending: Sports Medicine | Admitting: Sports Medicine

## 2013-08-19 DIAGNOSIS — M25551 Pain in right hip: Secondary | ICD-10-CM

## 2013-08-21 ENCOUNTER — Ambulatory Visit (INDEPENDENT_AMBULATORY_CARE_PROVIDER_SITE_OTHER): Payer: Medicare Other | Admitting: Pharmacist Clinician (PhC)/ Clinical Pharmacy Specialist

## 2013-08-21 DIAGNOSIS — Z7901 Long term (current) use of anticoagulants: Secondary | ICD-10-CM

## 2013-08-21 DIAGNOSIS — M79609 Pain in unspecified limb: Secondary | ICD-10-CM

## 2013-08-21 DIAGNOSIS — I4819 Other persistent atrial fibrillation: Secondary | ICD-10-CM

## 2013-08-21 DIAGNOSIS — I4891 Unspecified atrial fibrillation: Secondary | ICD-10-CM

## 2013-08-21 DIAGNOSIS — M79604 Pain in right leg: Secondary | ICD-10-CM

## 2013-08-21 DIAGNOSIS — M79605 Pain in left leg: Secondary | ICD-10-CM

## 2013-08-21 LAB — POCT INR: INR: 2.3

## 2013-09-20 ENCOUNTER — Ambulatory Visit
Admission: RE | Admit: 2013-09-20 | Discharge: 2013-09-20 | Disposition: A | Discharge status: Home / Self Care | Payer: Medicare Other | Source: Ambulatory Visit | Attending: Urology | Admitting: Urology

## 2013-09-20 DIAGNOSIS — N2889 Other specified disorders of kidney and ureter: Secondary | ICD-10-CM

## 2013-10-02 ENCOUNTER — Ambulatory Visit: Payer: Medicare Other | Admitting: Pharmacist Clinician (PhC)/ Clinical Pharmacy Specialist

## 2013-10-23 ENCOUNTER — Ambulatory Visit: Payer: Medicare Other | Admitting: Pharmacist Clinician (PhC)/ Clinical Pharmacy Specialist

## 2013-10-24 ENCOUNTER — Telehealth: Payer: Self-pay | Admitting: Pharmacist Clinician (PhC)/ Clinical Pharmacy Specialist

## 2013-10-26 NOTE — Telephone Encounter (Signed)
Closed encounter °

## 2013-11-08 ENCOUNTER — Telehealth: Payer: Self-pay | Admitting: Radiology

## 2013-11-08 NOTE — Telephone Encounter (Signed)
[name] states that her brother will be in town in approx 2 wks.  They plan to have a discussion with the patient and make a decision regarding cryoablation.    Tamyka Bezio Riki Rusk, RN 11/08/2013 12:22 PM

## 2013-12-08 ENCOUNTER — Other Ambulatory Visit: Payer: Self-pay | Admitting: Cardiovascular Disease

## 2014-01-05 ENCOUNTER — Other Ambulatory Visit: Payer: Self-pay | Admitting: Cardiovascular Disease

## 2014-01-05 NOTE — Telephone Encounter (Signed)
Rx was sent to pharmacy electronically. 

## 2014-01-12 ENCOUNTER — Other Ambulatory Visit: Payer: Self-pay | Admitting: Cardiovascular Disease

## 2014-01-24 ENCOUNTER — Encounter (HOSPITAL_COMMUNITY): Payer: Self-pay

## 2014-01-24 ENCOUNTER — Encounter (HOSPITAL_COMMUNITY)
Admission: RE | Admit: 2014-01-24 | Discharge: 2014-01-24 | Disposition: A | Discharge status: Home / Self Care | Payer: Medicare Other | Source: Ambulatory Visit | Attending: Internal Medicine | Admitting: Internal Medicine

## 2014-01-24 ENCOUNTER — Other Ambulatory Visit (HOSPITAL_COMMUNITY): Payer: Self-pay | Admitting: Internal Medicine

## 2014-01-24 DIAGNOSIS — D649 Anemia, unspecified: Secondary | ICD-10-CM | POA: Insufficient documentation

## 2014-01-24 MED ORDER — SODIUM CHLORIDE 0.9 % IV SOLN
1020.0000 mg | Freq: Once | INTRAVENOUS | Status: AC
  Administered 2014-01-24: 1020 mg via INTRAVENOUS
  Filled 2014-01-24: qty 34

## 2014-01-24 MED ORDER — SODIUM CHLORIDE 0.9 % IV SOLN
Freq: Once | INTRAVENOUS | Status: AC
  Administered 2014-01-24: 11:00:00 via INTRAVENOUS

## 2014-01-24 NOTE — Discharge Instructions (Signed)

## 2014-02-09 ENCOUNTER — Ambulatory Visit: Payer: Self-pay | Admitting: Pharmacist Clinician (PhC)/ Clinical Pharmacy Specialist

## 2014-02-09 DIAGNOSIS — I4819 Other persistent atrial fibrillation: Secondary | ICD-10-CM

## 2014-02-10 ENCOUNTER — Other Ambulatory Visit: Payer: Self-pay | Admitting: Cardiovascular Disease

## 2014-02-12 NOTE — Telephone Encounter (Signed)
Rx was sent to pharmacy electronically. 

## 2014-03-09 ENCOUNTER — Other Ambulatory Visit: Payer: Self-pay | Admitting: Cardiovascular Disease

## 2014-03-09 NOTE — Telephone Encounter (Signed)
Rx refill sent to patient pharmacy   

## 2014-03-26 ENCOUNTER — Ambulatory Visit: Payer: Medicare Other | Admitting: Cardiovascular Disease

## 2014-04-18 ENCOUNTER — Other Ambulatory Visit: Payer: Self-pay | Admitting: Cardiovascular Disease

## 2014-04-19 ENCOUNTER — Encounter: Payer: Self-pay | Admitting: Cardiovascular Disease

## 2014-04-19 ENCOUNTER — Ambulatory Visit (INDEPENDENT_AMBULATORY_CARE_PROVIDER_SITE_OTHER): Payer: Medicare Other | Admitting: Cardiovascular Disease

## 2014-04-19 VITALS — BP 108/78 | HR 84 | Resp 16 | Ht 64.0 in

## 2014-04-19 DIAGNOSIS — I4819 Other persistent atrial fibrillation: Secondary | ICD-10-CM

## 2014-04-19 DIAGNOSIS — Z7901 Long term (current) use of anticoagulants: Secondary | ICD-10-CM

## 2014-04-19 DIAGNOSIS — I10 Essential (primary) hypertension: Secondary | ICD-10-CM

## 2014-04-19 DIAGNOSIS — I481 Persistent atrial fibrillation: Secondary | ICD-10-CM

## 2014-04-19 NOTE — Patient Instructions (Signed)
Dr. Croitoru recommends that you schedule a follow-up appointment in: One year.   

## 2014-04-20 ENCOUNTER — Encounter: Payer: Self-pay | Admitting: Cardiovascular Disease

## 2014-04-20 ENCOUNTER — Other Ambulatory Visit: Payer: Self-pay

## 2014-04-20 MED ORDER — BENAZEPRIL HCL 40 MG PO TABS
ORAL_TABLET | ORAL | Status: DC
Start: 2014-04-20 — End: 2015-06-26

## 2014-04-20 NOTE — Telephone Encounter (Signed)
Rx sent to pharmacy   

## 2014-04-20 NOTE — Progress Notes (Signed)
Patient ID: Natalie [name], female   DOB: [birth year], 81 y.o.   MRN: [id number]      Reason for office visit Permanent atrial fibrillation  Eliabeth feels great and does not have any cardiovascular complaints. Has not had any bleeding problems other than bruising easily.  [name] is now 81 years old and has a long-standing history of permanent atrial fibrillation, COPD, previous GI bleeding secondary to gastric ulcer and chronic lower extremity edema related to peripheral venous insufficiency. She has severely limited activity due to advanced lumbar spine disease with chronic pain.   Allergies  Allergen Reactions  . Levofloxacin Other (See Comments)    REACTION: unspecified per [facility]    Current Outpatient Prescriptions  Medication Sig Dispense Refill  . ALPRAZolam (XANAX) 0.25 MG tablet Take 0.25 mg by mouth every 8 (eight) hours.    Marland Kitchen ascorbic acid (VITAMIN C) 500 MG tablet Take 1,000 mg by mouth daily.    . benazepril (LOTENSIN) 40 MG tablet TAKE 1 TABLET BY MOUTH EVERY DAY *MUST KEEP 04-05-14 APPOINTMENT* 30 tablet 0  . bisoprolol (ZEBETA) 5 MG tablet Take 5 mg by mouth daily.    . calcium-vitamin D (OSCAL WITH D) 500-200 MG-UNIT per tablet Take 1 tablet by mouth 3 (three) times daily.    Marland Kitchen dicyclomine (BENTYL) 10 MG capsule Take 10 mg by mouth 4 (four) times daily -  before meals and at bedtime.    . DULoxetine (CYMBALTA) 60 MG capsule Take 120 mg by mouth daily.     . furosemide (LASIX) 40 MG tablet Take 40 mg by mouth daily.    Marland Kitchen HYDROcodone-acetaminophen (NORCO/VICODIN) 5-325 MG per tablet Take 1 tablet by mouth 2 (two) times daily.    . lansoprazole (PREVACID) 30 MG capsule Take 30 mg by mouth 2 (two) times daily before a meal.    . levothyroxine (SYNTHROID, LEVOTHROID) 88 MCG tablet Take 88 mcg by mouth daily before breakfast.    . PROCTOSOL HC 2.5 % rectal cream Place 1 application rectally 3 (three) times daily.   5  . spironolactone (ALDACTONE) 25 MG tablet 25 mg daily.       . tamsulosin (FLOMAX) 0.4 MG CAPS Take 0.4 mg by mouth daily.    Marland Kitchen warfarin (COUMADIN) 0.5 mg TABS Take 0.5 mg by mouth daily.    Marland Kitchen warfarin (COUMADIN) 2.5 MG tablet Take 1 to 1.5 tablets by mouth daily as directed by coumadin clinic.  Needs INR for further refills 35 tablet 0   No current facility-administered medications for this visit.    Past Medical History  Diagnosis Date  . Osteoporosis   . Hypertension   . Hypothyroidism   . Lower GI bleeding 07/21/11  . Atrial fibrillation   . COPD (chronic obstructive pulmonary disease)   . Pneumonia   . Shortness of breath 07/21/11    "anytime"  . Sinus headache 07/21/11    "all the time"  . Bladder infection, chronic   . Arthritis     "everywhere"  . Anxiety   . Chronic lower back pain   . Chronic abdominal pain   . Renal mass, left 2013  . PUD (peptic ulcer disease)   . Chronic cystitis   . Trigeminal neuralgia     /E-chart  . Chronic kidney disease   . Anemia     Past Surgical History  Procedure Laterality Date  . Vaginal hysterectomy      partial  . Cataract extraction w/ intraocular lens  implant, bilateral    .  Percutaneous pinning femoral neck fracture  11/2003    w/closed reduction ; left/E-chart  . Laceration repair  11/2003    left eyebrow  . Bilateral salpingoophorectomy  12/2005    lap/E-chart  . Cholecystectomy  1960  . Appendectomy      presumed/E-chart  . Esophagogastroduodenoscopy  07/23/2011    Procedure: ESOPHAGOGASTRODUODENOSCOPY (EGD);  Surgeon: Wonda Horner, MD;  Location: [facility] ENDOSCOPY;  Service: Endoscopy;  Laterality: N/A;    No family history on file.  History   Social History  . Marital Status: Widowed    Spouse Name: N/A    Number of Children: N/A  . Years of Education: N/A   Occupational History  . Not on file.   Social History Main Topics  . Smoking status: Former Smoker -- 1.00 packs/day    Types: Cigarettes    Quit date: 09/11/2009  . Smokeless tobacco: Never Used     Comment:  07/21/11 "can't remember when I quit smoking"  . Alcohol Use: 1.2 oz/week    2 Glasses of wine per week     Comment: 07/21/11 "seldom drink"  . Drug Use: No  . Sexual Activity: No   Other Topics Concern  . Not on file   Social History Narrative    Review of systems: The patient specifically denies any chest pain at rest or with exertion, dyspnea at rest or with exertion, orthopnea, paroxysmal nocturnal dyspnea, syncope, palpitations, focal neurological deficits, intermittent claudication, lower extremity edema, unexplained weight gain, cough, hemoptysis or wheezing.  The patient also denies abdominal pain, nausea, vomiting, dysphagia, diarrhea, constipation, polyuria, polydipsia, dysuria, hematuria, frequency, urgency, abnormal bleeding or bruising, fever, chills, unexpected weight changes, mood swings, change in skin or hair texture, change in voice quality, auditory or visual problems, allergic reactions or rashes, new musculoskeletal complaints other than usual "aches and pains".   PHYSICAL EXAM BP 108/78 mmHg  Pulse 84  Resp 16  Ht 5\' 4"  (1.626 m)  General: Alert, oriented x3, no distress Head: no evidence of trauma, PERRL, EOMI, no exophtalmos or lid lag, no myxedema, no xanthelasma; normal ears, nose and oropharynx Neck: normal jugular venous pulsations and no hepatojugular reflux; brisk carotid pulses without delay and no carotid bruits Chest: clear to auscultation, no signs of consolidation by percussion or palpation, normal fremitus, symmetrical and full respiratory excursions Cardiovascular: normal position and quality of the apical impulse, irregular rhythm, normal first and widely split second heart sounds, no murmurs, rubs or gallops Abdomen: no tenderness or distention, no masses by palpation, no abnormal pulsatility or arterial bruits, normal bowel sounds, no hepatosplenomegaly Extremities: no clubbing, cyanosis or edema; 2+ radial, ulnar and brachial pulses bilaterally;  2+ right femoral, posterior tibial and dorsalis pedis pulses; 2+ left femoral, posterior tibial and dorsalis pedis pulses; no subclavian or femoral bruits Neurological: grossly nonfocal   EKG: Atrial fibrillation with controlled ventricular rate, new right bundle branch block  Lipid Panel  No results found for: CHOL, TRIG, HDL, CHOLHDL, VLDL, LDLCALC, LDLDIRECT  BMET    Component Value Date/Time   NA 134* 08/05/2012 0127   K 3.3* 08/05/2012 0127   CL 94* 08/05/2012 0127   CO2 31 08/05/2012 0127   GLUCOSE 111* 08/05/2012 0127   BUN 12 08/05/2012 0127   CREATININE 0.60 08/05/2012 0127   CALCIUM 9.0 08/05/2012 0127   GFRNONAA 84* 08/05/2012 0127   GFRAA >90 08/05/2012 0127     ASSESSMENT AND PLAN   Emmi has well rate controlled and appropriately anticoagulated chronic  atrial fibrillation that is asymptomatic. Her warfarin anticoagulation has been fairly steady and has not been complicated by embolic events or bleeding. She has developed an asymptomatic new right bundle branch block that is likely not of great clinical importance. To some degree it may be related to cor pulmonale from her chronic lung problems. Her last echocardiogram did not show overt changes of right chamber enlargement. In the absence of new symptoms I don't think the conduction abnormality requires further evaluation. Orders Placed This Encounter  Procedures  . EKG 12-Lead   Meds ordered this encounter  Medications  . PROCTOSOL HC 2.5 % rectal cream    Sig: Place 1 application rectally 3 (three) times daily.     Refill:  Elgin Yarnell Arvidson, MD, [facility] [phone] office [phone] pager

## 2014-07-19 ENCOUNTER — Emergency Department (HOSPITAL_COMMUNITY): Payer: Medicare Other

## 2014-07-19 ENCOUNTER — Emergency Department (HOSPITAL_COMMUNITY)
Admission: EM | Admit: 2014-07-19 | Discharge: 2014-07-19 | Disposition: A | Discharge status: Home / Self Care | Payer: Medicare Other | Attending: Emergency Medicine | Admitting: Emergency Medicine

## 2014-07-19 ENCOUNTER — Encounter (HOSPITAL_COMMUNITY): Payer: Self-pay | Admitting: Nurse Practitioner

## 2014-07-19 DIAGNOSIS — M810 Age-related osteoporosis without current pathological fracture: Secondary | ICD-10-CM | POA: Diagnosis not present

## 2014-07-19 DIAGNOSIS — G8929 Other chronic pain: Secondary | ICD-10-CM | POA: Insufficient documentation

## 2014-07-19 DIAGNOSIS — J449 Chronic obstructive pulmonary disease, unspecified: Secondary | ICD-10-CM | POA: Diagnosis not present

## 2014-07-19 DIAGNOSIS — R197 Diarrhea, unspecified: Secondary | ICD-10-CM | POA: Diagnosis present

## 2014-07-19 DIAGNOSIS — Z87891 Personal history of nicotine dependence: Secondary | ICD-10-CM | POA: Insufficient documentation

## 2014-07-19 DIAGNOSIS — Z9889 Other specified postprocedural states: Secondary | ICD-10-CM | POA: Diagnosis not present

## 2014-07-19 DIAGNOSIS — N189 Chronic kidney disease, unspecified: Secondary | ICD-10-CM | POA: Insufficient documentation

## 2014-07-19 DIAGNOSIS — I129 Hypertensive chronic kidney disease with stage 1 through stage 4 chronic kidney disease, or unspecified chronic kidney disease: Secondary | ICD-10-CM | POA: Insufficient documentation

## 2014-07-19 DIAGNOSIS — K5732 Diverticulitis of large intestine without perforation or abscess without bleeding: Secondary | ICD-10-CM | POA: Insufficient documentation

## 2014-07-19 DIAGNOSIS — E039 Hypothyroidism, unspecified: Secondary | ICD-10-CM | POA: Diagnosis not present

## 2014-07-19 DIAGNOSIS — M1990 Unspecified osteoarthritis, unspecified site: Secondary | ICD-10-CM | POA: Insufficient documentation

## 2014-07-19 DIAGNOSIS — I4891 Unspecified atrial fibrillation: Secondary | ICD-10-CM | POA: Insufficient documentation

## 2014-07-19 DIAGNOSIS — Z90710 Acquired absence of both cervix and uterus: Secondary | ICD-10-CM | POA: Insufficient documentation

## 2014-07-19 DIAGNOSIS — Z9049 Acquired absence of other specified parts of digestive tract: Secondary | ICD-10-CM | POA: Insufficient documentation

## 2014-07-19 DIAGNOSIS — K5733 Diverticulitis of large intestine without perforation or abscess with bleeding: Secondary | ICD-10-CM

## 2014-07-19 DIAGNOSIS — Z8701 Personal history of pneumonia (recurrent): Secondary | ICD-10-CM | POA: Insufficient documentation

## 2014-07-19 DIAGNOSIS — Z862 Personal history of diseases of the blood and blood-forming organs and certain disorders involving the immune mechanism: Secondary | ICD-10-CM | POA: Insufficient documentation

## 2014-07-19 DIAGNOSIS — Z79899 Other long term (current) drug therapy: Secondary | ICD-10-CM | POA: Insufficient documentation

## 2014-07-19 DIAGNOSIS — Z7901 Long term (current) use of anticoagulants: Secondary | ICD-10-CM | POA: Insufficient documentation

## 2014-07-19 DIAGNOSIS — F419 Anxiety disorder, unspecified: Secondary | ICD-10-CM | POA: Diagnosis not present

## 2014-07-19 DIAGNOSIS — R1030 Lower abdominal pain, unspecified: Secondary | ICD-10-CM

## 2014-07-19 LAB — CBC WITH DIFFERENTIAL/PLATELET
BASOS PCT: 0 % (ref 0–1)
Basophils Absolute: 0 10*3/uL (ref 0.0–0.1)
EOS ABS: 0.1 10*3/uL (ref 0.0–0.7)
EOS PCT: 1 % (ref 0–5)
HEMATOCRIT: 40.3 % (ref 36.0–46.0)
HEMOGLOBIN: 12.9 g/dL (ref 12.0–15.0)
LYMPHS ABS: 2.6 10*3/uL (ref 0.7–4.0)
LYMPHS PCT: 31 % (ref 12–46)
MCH: 28.4 pg (ref 26.0–34.0)
MCHC: 32 g/dL (ref 30.0–36.0)
MCV: 88.8 fL (ref 78.0–100.0)
MONO ABS: 1 10*3/uL (ref 0.1–1.0)
Monocytes Relative: 12 % (ref 3–12)
Neutro Abs: 4.7 10*3/uL (ref 1.7–7.7)
Neutrophils Relative %: 56 % (ref 43–77)
PLATELETS: 367 10*3/uL (ref 150–400)
RBC: 4.54 MIL/uL (ref 3.87–5.11)
RDW: 13.8 % (ref 11.5–15.5)
WBC: 8.4 10*3/uL (ref 4.0–10.5)

## 2014-07-19 LAB — COMPREHENSIVE METABOLIC PANEL
ALBUMIN: 3.7 g/dL (ref 3.5–5.2)
ALK PHOS: 79 U/L (ref 39–117)
ALT: 18 U/L (ref 0–35)
AST: 22 U/L (ref 0–37)
Anion gap: 7 (ref 5–15)
BILIRUBIN TOTAL: 0.6 mg/dL (ref 0.3–1.2)
BUN: 11 mg/dL (ref 6–23)
CHLORIDE: 98 mmol/L (ref 96–112)
CO2: 27 mmol/L (ref 19–32)
Calcium: 8.8 mg/dL (ref 8.4–10.5)
Creatinine, Ser: 0.53 mg/dL (ref 0.50–1.10)
GFR calc Af Amer: >90 mL/min (ref >90)
GFR calc non Af Amer: 87 mL/min — ABNORMAL LOW (ref >90)
Glucose, Bld: 119 mg/dL — ABNORMAL HIGH (ref 70–99)
POTASSIUM: 4.1 mmol/L (ref 3.5–5.1)
Sodium: 132 mmol/L — ABNORMAL LOW (ref 135–145)
Total Protein: 7.1 g/dL (ref 6.0–8.3)

## 2014-07-19 LAB — I-STAT CG4 LACTIC ACID, ED: LACTIC ACID, VENOUS: 1.19 mmol/L (ref 0.5–2.0)

## 2014-07-19 LAB — PROTIME-INR
INR: 1.58 — ABNORMAL HIGH (ref 0.00–1.49)
Prothrombin Time: 19 seconds — ABNORMAL HIGH (ref 11.6–15.2)

## 2014-07-19 MED ORDER — CIPROFLOXACIN HCL 500 MG PO TABS: 500.0000 mg | ORAL_TABLET | Freq: Two times a day (BID) | ORAL | Status: DC

## 2014-07-19 MED ORDER — IOHEXOL 300 MG/ML  SOLN
100.0000 mL | Freq: Once | INTRAMUSCULAR | Status: AC | PRN
  Administered 2014-07-19: 100 mL via INTRAVENOUS

## 2014-07-19 MED ORDER — SODIUM CHLORIDE 0.9 % IV BOLUS (SEPSIS)
1000.0000 mL | Freq: Once | INTRAVENOUS | Status: AC
  Administered 2014-07-19: 1000 mL via INTRAVENOUS

## 2014-07-19 MED ORDER — CIPROFLOXACIN HCL 500 MG PO TABS
500.0000 mg | ORAL_TABLET | Freq: Once | ORAL | Status: AC
  Administered 2014-07-19: 500 mg via ORAL
  Filled 2014-07-19: qty 1

## 2014-07-19 MED ORDER — METRONIDAZOLE 500 MG PO TABS
500.0000 mg | ORAL_TABLET | Freq: Once | ORAL | Status: AC
Start: 2014-07-19 — End: 2014-07-19
  Administered 2014-07-19: 500 mg via ORAL
  Filled 2014-07-19: qty 1

## 2014-07-19 MED ORDER — METRONIDAZOLE 500 MG PO TABS: 500.0000 mg | ORAL_TABLET | Freq: Two times a day (BID) | ORAL | Status: DC

## 2014-07-19 NOTE — Progress Notes (Signed)
CSW met with pt at bedside. Daughter was present. Patient confirms that she is from home and presents to [facility] due to diarrhea. Patient informed CSW that she lives home alone in [place].   Patient states that she can perform ADL's independently. Daughter informed CSW that the pt ambulates using a walker at all times. Daughter states that the pt falls often. 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   Daughter informed CSW that she believes the pt will eventually need a SNF. Daughter states she is the pt's primary support.   CSW gave daughter and pt a list of SNF.   Katherine/Daughter [phone]  Willette Brace [phone] ED CSW 07/19/2014 11:40 PM

## 2014-07-19 NOTE — ED Notes (Signed)
Pt to BR, will then be discharged.

## 2014-07-19 NOTE — Discharge Instructions (Signed)

## 2014-07-19 NOTE — ED Notes (Signed)
Pt daughter stating that she does not want pt discharge and requesting to speak to MD about possibility of admission. MD made aware.

## 2014-07-19 NOTE — ED Provider Notes (Addendum)
CSN: [id number]     Arrival date & time 07/19/14  1624 History   First MD Initiated Contact with Patient 07/19/14 1639     Chief Complaint  Patient presents with  . Diarrhea     (Consider location/radiation/quality/duration/timing/severity/associated sxs/prior Treatment) Patient is a 81 y.o. female presenting with diarrhea. The history is provided by the patient.  Diarrhea Quality:  Watery Severity:  Moderate Onset quality:  Sudden Number of episodes:  6 episodes a day or more Duration:  5 days Timing:  Constant Progression:  Unchanged Relieved by:  Nothing Exacerbated by: eating. Ineffective treatments:  None tried Associated symptoms: abdominal pain   Associated symptoms: no chills, no recent cough, no fever, no headaches, no myalgias and no vomiting   Risk factors: no recent antibiotic use, no sick contacts, no suspicious food intake and no travel to endemic areas     Past Medical History  Diagnosis Date  . Osteoporosis   . Hypertension   . Hypothyroidism   . Lower GI bleeding 07/21/11  . Atrial fibrillation   . COPD (chronic obstructive pulmonary disease)   . Pneumonia   . Shortness of breath 07/21/11    "anytime"  . Sinus headache 07/21/11    "all the time"  . Bladder infection, chronic   . Arthritis     "everywhere"  . Anxiety   . Chronic lower back pain   . Chronic abdominal pain   . Renal mass, left 2013  . PUD (peptic ulcer disease)   . Chronic cystitis   . Trigeminal neuralgia     /E-chart  . Chronic kidney disease   . Anemia    Past Surgical History  Procedure Laterality Date  . Vaginal hysterectomy      partial  . Cataract extraction w/ intraocular lens  implant, bilateral    . Percutaneous pinning femoral neck fracture  11/2003    w/closed reduction ; left/E-chart  . Laceration repair  11/2003    left eyebrow  . Bilateral salpingoophorectomy  12/2005    lap/E-chart  . Cholecystectomy  1960  . Appendectomy      presumed/E-chart  .  Esophagogastroduodenoscopy  07/23/2011    Procedure: ESOPHAGOGASTRODUODENOSCOPY (EGD);  Surgeon: Wonda Horner, MD;  Location: [facility] ENDOSCOPY;  Service: Endoscopy;  Laterality: N/A;   History reviewed. No pertinent family history. History  Substance Use Topics  . Smoking status: Former Smoker -- 1.00 packs/day    Types: Cigarettes    Quit date: 09/11/2009  . Smokeless tobacco: Never Used     Comment: 07/21/11 "can't remember when I quit smoking"  . Alcohol Use: 1.2 oz/week    2 Glasses of wine per week     Comment: 07/21/11 "seldom drink"   OB History    No data available     Review of Systems  Constitutional: Negative for fever and chills.  Gastrointestinal: Positive for abdominal pain and diarrhea. Negative for vomiting.  Musculoskeletal: Negative for myalgias.  Neurological: Negative for headaches.  All other systems reviewed and are negative.     Allergies  Levofloxacin  Home Medications   Prior to Admission medications   Medication Sig Start Date End Date Taking? Authorizing Provider  ALPRAZolam (XANAX) 0.25 MG tablet Take 0.25 mg by mouth every 8 (eight) hours.    Historical Provider, MD  ascorbic acid (VITAMIN C) 500 MG tablet Take 1,000 mg by mouth daily.    Historical Provider, MD  benazepril (LOTENSIN) 40 MG tablet TAKE 1 TABLET BY MOUTH EVERY DAY *  MUST KEEP 04-05-14 APPOINTMENT* 04/20/14   Mihai Croitoru, MD  bisoprolol (ZEBETA) 5 MG tablet Take 5 mg by mouth daily.    Historical Provider, MD  calcium-vitamin D (OSCAL WITH D) 500-200 MG-UNIT per tablet Take 1 tablet by mouth 3 (three) times daily.    Historical Provider, MD  dicyclomine (BENTYL) 10 MG capsule Take 10 mg by mouth 4 (four) times daily -  before meals and at bedtime.    Historical Provider, MD  DULoxetine (CYMBALTA) 60 MG capsule Take 120 mg by mouth daily.     Historical Provider, MD  furosemide (LASIX) 40 MG tablet Take 40 mg by mouth daily. 08/03/12   Historical Provider, MD  HYDROcodone-acetaminophen  (NORCO/VICODIN) 5-325 MG per tablet Take 1 tablet by mouth 2 (two) times daily.    Historical Provider, MD  lansoprazole (PREVACID) 30 MG capsule Take 30 mg by mouth 2 (two) times daily before a meal.    Historical Provider, MD  levothyroxine (SYNTHROID, LEVOTHROID) 88 MCG tablet Take 88 mcg by mouth daily before breakfast.    Historical Provider, MD  PROCTOSOL HC 2.5 % rectal cream Place 1 application rectally 3 (three) times daily.  03/01/14   Historical Provider, MD  spironolactone (ALDACTONE) 25 MG tablet 25 mg daily.  11/18/12   Historical Provider, MD  tamsulosin (FLOMAX) 0.4 MG CAPS Take 0.4 mg by mouth daily. 09/01/11   Erick Colace, NP  warfarin (COUMADIN) 0.5 mg TABS Take 0.5 mg by mouth daily. 09/01/11   Erick Colace, NP  warfarin (COUMADIN) 2.5 MG tablet Take 1 to 1.5 tablets by mouth daily as directed by coumadin clinic.  Needs INR for further refills 12/08/13   Mihai Croitoru, MD   BP 164/94 mmHg  Pulse 85  Temp(Src) 98.4 F (36.9 C) (Oral)  Resp 14  SpO2 96% Physical Exam  Constitutional: She is oriented to person, place, and time. She appears well-developed and well-nourished. No distress.  HENT:  Head: Normocephalic and atraumatic.  Eyes: EOM are normal. Pupils are equal, round, and reactive to light.  Cardiovascular: Normal rate, normal heart sounds and intact distal pulses.  An irregularly irregular rhythm present. Exam reveals no friction rub.   No murmur heard. Pulmonary/Chest: Effort normal and breath sounds normal. She has no wheezes. She has no rales.  Abdominal: Soft. Bowel sounds are normal. She exhibits no distension. There is tenderness in the right lower quadrant, suprapubic area and left lower quadrant. There is no rebound, no guarding, no tenderness at McBurney's point and negative Murphy's sign.  Musculoskeletal: Normal range of motion. She exhibits no tenderness.  No edema  Neurological: She is alert and oriented to person, place, and time. No cranial  nerve deficit.  Skin: Skin is warm and dry. No rash noted.  Psychiatric: She has a normal mood and affect. Her behavior is normal.  Vitals reviewed.   ED Course  Procedures (including critical care time) Labs Review Labs Reviewed  COMPREHENSIVE METABOLIC PANEL - Abnormal; Notable for the following:    Sodium 132 (*)    Glucose, Bld 119 (*)    GFR calc non Af Amer 87 (*)    All other components within normal limits  PROTIME-INR - Abnormal; Notable for the following:    Prothrombin Time 19.0 (*)    INR 1.58 (*)    All other components within normal limits  CBC WITH DIFFERENTIAL/PLATELET  I-STAT CG4 LACTIC ACID, ED    Imaging Review Ct Abdomen Pelvis W Contrast  07/19/2014  CLINICAL DATA:  Four day history of diarrhea and bilateral lower quadrant abdominal pain. Surgical history includes vaginal hysterectomy, bilateral salpingo-oophorectomy, cholecystectomy and appendectomy.  EXAM: CT ABDOMEN AND PELVIS WITH CONTRAST  TECHNIQUE: Multidetector CT imaging of the abdomen and pelvis was performed using the standard protocol following bolus administration of intravenous contrast.  CONTRAST:  16mL OMNIPAQUE IOHEXOL 300 MG/ML IV. Oral contrast was also administered.  COMPARISON:  02/20/2013, 07/21/2011, 01/17/2008.  FINDINGS: Liver: Simple cysts involving the right lobe of the liver, unchanged. No new hepatic masses. Relative enlargement of the left hepatic lobe and caudate lobe, unchanged.  Biliary: Surgically absent gallbladder. No biliary ductal dilation.  Spleen: Normal in size and appearance. Focus of accessory splenic tissue medial to the lower pole of the spleen.  Pancreas:  Normal in appearance.  No pancreatic ductal dilation.  Adrenal glands: Stable left adrenal nodule measuring approximately 2.0 x 1.3 cm. Normal-appearing right adrenal gland.  Genitourinary: Interval increase in size of an enhancing mass arising from the mid to lower pole of the left kidney anteriorly, current measurements  approximating 2.7 x 1.7 cm (measured on the delayed images). Scarring with dystrophic calcification involving the mid right kidney. Numerous cortical cysts involving the right kidney. Small cortical cysts involving the lower pole of the left kidney. No hydronephrosis. No urinary tract calculi. Urinary bladder contains gas, presumably from recent instrumentation.  Uterus surgically absent.  No adnexal masses or free pelvic fluid.  Gastrointestinal: Stomach decompressed and unremarkable. Normal-appearing small bowel. Sigmoid colon diverticulosis with evidence of mild acute diverticulitis involving the distal sigmoid colon low in the pelvis. No extraluminal gas. No abnormal fluid collection. Liquified stool in the sigmoid colon and rectum with moderate inspissated stool burden in the remainder of the colon. Cecum mobile, positioned in the right upper quadrant. Appendix surgically absent.  Vascular: Severe aortoiliofemoral atherosclerosis without aneurysm. Visceral arteries patent though atherosclerotic.  Lymphatic:  No pathologic lymphadenopathy in the abdomen or pelvis.  Ascites: Absent.  Musculoskeletal: Degenerative disc disease at L4-5. Facet degenerative changes involving the lower lumbar spine. Osseous demineralization. Mild degenerative changes involving the sacroiliac joints. Prior ORIF of a left femoral neck fracture with healing. Thoracolumbar scoliosis convex right.  Other findings: Laxity of the pelvic floor musculature is suspected as the urinary bladder extends more inferiorly than is typically seen.  Visualized lower thorax: Heart size upper normal to slightly enlarged but stable. Visualized lung bases clear.  IMPRESSION: 1. Mild acute diverticulitis involving the distal sigmoid colon low in the pelvis. No evidence of perforation or abscess. 2. Since the most recent CT in December, 2015, increase in size of a solid mass arising from the anterior mid to lower pole of the left kidney, measured above. Renal  cell carcinoma is the leading diagnostic consideration. 3. No evidence of metastatic disease. 4. Stable left adrenal adenoma.   Electronically Signed   By: Evangeline Dakin M.D.   On: 07/19/2014 20:27     EKG Interpretation None      MDM   Final diagnoses:  Lower abdominal pain  Diverticulitis of large intestine without perforation or abscess with bleeding    Patient with a history of 4-5 days of persistent diarrhea and lower abdominal tenderness. She has 6 or more loose stools daily without blood in her stool. She denies any fever, urinary symptoms, nausea or vomiting. No recent medication changes. No prior history of the same. No recent travel or bad food exposure. No sick contacts.  Concern for diverticulitis versus C. difficile colitis.  Patient has no urinary symptoms, no nausea or vomiting or symptoms concerning for bowel obstruction.  CBC, CMP, INR, lactic acid, C. difficile by PCR and CT of the abdomen and pelvis pending. Patient given IV fluids.  8:40 PM CT findings consistent with diverticulitis.  Also concern for renal cell carcinoma however pt does have known mass and will have f/u with PCP.  Due to no vomiting and tolerating po's. Will give cipro/flagyl for treatment. Pt was able to ambulate to the bathroom with her walker.  Social work and case management also consulted for further evaluation and recommendation.  Blanchie Dessert, MD 07/19/14 2124  Blanchie Dessert, MD 07/19/14 [phone]

## 2014-07-19 NOTE — ED Notes (Signed)
C Diff sample formed, unable to run lab. Will make MD aware.

## 2014-07-19 NOTE — ED Notes (Signed)
Pt in CT at this time.

## 2014-07-19 NOTE — ED Notes (Signed)
Pt presents from with c/o diarrhea for the last 4 days and bilateral lower quadrants abdominal pain. Denies nausea or vomiting, of being on abx in the recent past.

## 2014-07-19 NOTE — ED Notes (Signed)
Delay in lab draw pt still in bathroom.

## 2014-07-19 NOTE — ED Notes (Signed)
Bed: WA02 Expected date:  Expected time:  Means of arrival:  Comments: EMS Elderly, Diarrhea

## 2014-07-19 NOTE — ED Notes (Signed)
Delay in lab draw, pt in bathroom at this time.

## 2014-07-19 NOTE — ED Notes (Signed)
Social worker called at this time.

## 2014-07-19 NOTE — ED Notes (Signed)
Pt noted to have had incontinence of urine and stool, assisted to BR with walker. Pt had cleaning at bedside, bedding changed. Food given.

## 2014-07-19 NOTE — ED Notes (Signed)
Case manager at bedside 

## 2014-07-19 NOTE — ED Notes (Signed)
Dr. Plunkett at bedside.  

## 2014-07-19 NOTE — ED Notes (Signed)
SW has spoken to pt and given her resources. States that she wants case management as well. Amy paged

## 2014-07-19 NOTE — ED Notes (Signed)
Pt escorted via wheelchair to exit at this time.

## 2014-07-20 NOTE — Progress Notes (Addendum)
CARE MANAGEMENT ED NOTE 07/20/2014  Patient:  Natalie [name], Natalie [name]   Account Number:  [id number]  Date Initiated:  07/19/2014  Documentation initiated by:  Livia Snellen  Subjective/Objective Assessment:   Patient presents to Ed with diarrhea     Subjective/Objective Assessment Detail:     Action/Plan:   Patient to be discharged with home health services and dme   Action/Plan Detail:   Anticipated DC Date:  07/19/2014     Status Recommendation to Physician:   Result of Recommendation:    Other ED Services  Consult Working Plan   In-house referral  [place]  Other  CM consult  Outpatient Services - Pt will follow up   [place]   Choice offered to / List presented to:  C-4 Adult Children  DME arranged  [place] [facility]     DME agency  [place].    Status of service:  Completed, signed off  ED Comments:   ED Comments Detail:  EDCM spoke to patient and her daughter Vergia Alcon [phone] at bedside.  Patient lives at home alone for the most part per [facility].  [name] reports her sister stays at patient's home, "But she travels alot."  Patient has a walker, bedside commode, shower chair at home. Everett Graff reports patient has a 2 story house with patient's bedroom upstairs.  EDCM asked patient how she goes upstairs?  Patient reports, "I crawl up the steps." [facility] suggested having hospital bed for downstairs.  Patient and [name] agreeable to this.  Patient's daughter also requesting a wheelchair.  EDCM also suggested looking into installing a stair lift for the patient in the home.  EDCM provided patient with list of home health agencies in [facility] and a list of private duty nursing agencies.  EDCM explained to patient and daughter with home health the patient may receive visiting RN, PT, OT, aide and social  worker and agency of their choice has 24-48 hours to contact them.  EDCM explained to patient private duty services will be out of pocket.  EDCM will also place [facility] consult.  [facility] encouraged patient to use her walker at all times.  EDCM instructed patient's daughter to clear the floors of anything that may causethe patient to fall. Patient's daughter verbalized understanding.  Patient's daughter reports she lives 15-20 minutes away from the patient and will be checking in on the patient.  Patient's daughter reports there will e someone staying with her tonight.  [facility] consulted EDSW for possible placement.  EDCM explained to patient and her daughter SW with home health agency will be able to assist with placement from home. Discussed patient with EDP who placed orders for home health RN, PT, OT, aide and social worker.  Orders placed for wheelchair and hospital bed.  Patient and patient's daughter thankful for services.  No further EDCM needs at this time.   07/20/2014 A. Walker Sitar RNCM 0055am [facility] faxed home health and dme orders to [facility] at 0040am with confirmation of receipt at 0045am.  No further EDCM needs at this time.  07/20/2014 A. Sani Madariaga RNCM 1332pm  EDCM called [facility] and spoke to [place] who reports [facility] has received home health referral and has contacted the patient's daughter today at [place] called patient's daughter [name] for follow up.  Per patient's daughter, patient is tired  today but diarrhea has slowed down "a bit."  Patient's daughter confirms she has spoken to [facility].  [name] also reports patient's hospital bed and wheelchair will be delivered on Monday.  [name] also reports antibiotic prescriptions have been filled.  EDCM informed [name] about life alert and gave her the phone number for [employer] services.  Also provided [name] with phone number to [place] [facility] for possible assistance for stair lift and walk in bath.  Patient's daughter thankful for resources.  No further EDCM  needs at this time.

## 2014-08-24 ENCOUNTER — Other Ambulatory Visit: Payer: Self-pay | Admitting: *Deleted

## 2014-08-24 NOTE — Patient Outreach (Signed)
[name]) Care Management  08/24/2014  Natalie [name] 1930-07-13 [id number]   Patient is not eligible for [facility] care management services.   Outreach call to patient for community resource information if needed. Patient gave daughter/caregiver permission to take call for her. HIPPA verification received from daughter; daughter states patient is hard of hearing.  Daughter states she is currently staying with patient and has assistance from another caregiver with her mother.States her mother is able to take her own medications but she does assist her. No problems getting prescriptions filled. States currently home health services are active with patient.   Questions answered regarding community services. States she has medicaid office number and knows her mother does not [place] for [employer]. States currently no financial concerns. Daughter states no other concerns, Thanked me for call.  Plan: case closed. Sherrin Daisy, RN BSN CCM Care Management Coordinator [facility] Care Management  [phone] .

## 2014-08-28 NOTE — Patient Outreach (Signed)
[facility]) Care Management  08/28/2014  JAIMA JANNEY 1930-08-02 [id number]   Notification from Sherrin Daisy, RN to close case as no further inventions needed.  Ronnell Freshwater. [place], [place] Management [place] Assistant Phone: [phone] Fax: [phone]

## 2014-11-30 ENCOUNTER — Emergency Department (HOSPITAL_COMMUNITY): Payer: Medicare Other

## 2014-11-30 ENCOUNTER — Encounter (HOSPITAL_COMMUNITY): Payer: Self-pay | Admitting: *Deleted

## 2014-11-30 ENCOUNTER — Inpatient Hospital Stay (HOSPITAL_COMMUNITY)
Admission: EM | Admit: 2014-11-30 | Discharge: 2014-12-04 | DRG: 392 | Disposition: A | Discharge status: Home Health | Payer: Medicare Other | Attending: Family Medicine | Admitting: Family Medicine

## 2014-11-30 DIAGNOSIS — N189 Chronic kidney disease, unspecified: Secondary | ICD-10-CM | POA: Diagnosis present

## 2014-11-30 DIAGNOSIS — K922 Gastrointestinal hemorrhage, unspecified: Secondary | ICD-10-CM

## 2014-11-30 DIAGNOSIS — Z8711 Personal history of peptic ulcer disease: Secondary | ICD-10-CM

## 2014-11-30 DIAGNOSIS — I481 Persistent atrial fibrillation: Secondary | ICD-10-CM | POA: Diagnosis present

## 2014-11-30 DIAGNOSIS — Z961 Presence of intraocular lens: Secondary | ICD-10-CM | POA: Diagnosis present

## 2014-11-30 DIAGNOSIS — M545 Low back pain: Secondary | ICD-10-CM | POA: Diagnosis present

## 2014-11-30 DIAGNOSIS — E039 Hypothyroidism, unspecified: Secondary | ICD-10-CM | POA: Diagnosis present

## 2014-11-30 DIAGNOSIS — Z87891 Personal history of nicotine dependence: Secondary | ICD-10-CM | POA: Diagnosis not present

## 2014-11-30 DIAGNOSIS — Z79891 Long term (current) use of opiate analgesic: Secondary | ICD-10-CM

## 2014-11-30 DIAGNOSIS — Z9841 Cataract extraction status, right eye: Secondary | ICD-10-CM | POA: Diagnosis not present

## 2014-11-30 DIAGNOSIS — I4819 Other persistent atrial fibrillation: Secondary | ICD-10-CM | POA: Diagnosis present

## 2014-11-30 DIAGNOSIS — I872 Venous insufficiency (chronic) (peripheral): Secondary | ICD-10-CM

## 2014-11-30 DIAGNOSIS — K254 Chronic or unspecified gastric ulcer with hemorrhage: Secondary | ICD-10-CM | POA: Diagnosis not present

## 2014-11-30 DIAGNOSIS — Z881 Allergy status to other antibiotic agents status: Secondary | ICD-10-CM

## 2014-11-30 DIAGNOSIS — D689 Coagulation defect, unspecified: Secondary | ICD-10-CM | POA: Diagnosis present

## 2014-11-30 DIAGNOSIS — R791 Abnormal coagulation profile: Secondary | ICD-10-CM | POA: Diagnosis not present

## 2014-11-30 DIAGNOSIS — M810 Age-related osteoporosis without current pathological fracture: Secondary | ICD-10-CM | POA: Diagnosis present

## 2014-11-30 DIAGNOSIS — M1990 Unspecified osteoarthritis, unspecified site: Secondary | ICD-10-CM | POA: Diagnosis present

## 2014-11-30 DIAGNOSIS — K5732 Diverticulitis of large intestine without perforation or abscess without bleeding: Principal | ICD-10-CM | POA: Diagnosis present

## 2014-11-30 DIAGNOSIS — F329 Major depressive disorder, single episode, unspecified: Secondary | ICD-10-CM | POA: Diagnosis present

## 2014-11-30 DIAGNOSIS — F411 Generalized anxiety disorder: Secondary | ICD-10-CM | POA: Diagnosis present

## 2014-11-30 DIAGNOSIS — I129 Hypertensive chronic kidney disease with stage 1 through stage 4 chronic kidney disease, or unspecified chronic kidney disease: Secondary | ICD-10-CM | POA: Diagnosis present

## 2014-11-30 DIAGNOSIS — T45515A Adverse effect of anticoagulants, initial encounter: Secondary | ICD-10-CM | POA: Diagnosis present

## 2014-11-30 DIAGNOSIS — N390 Urinary tract infection, site not specified: Secondary | ICD-10-CM

## 2014-11-30 DIAGNOSIS — G8929 Other chronic pain: Secondary | ICD-10-CM | POA: Diagnosis present

## 2014-11-30 DIAGNOSIS — Z8719 Personal history of other diseases of the digestive system: Secondary | ICD-10-CM

## 2014-11-30 DIAGNOSIS — M549 Dorsalgia, unspecified: Secondary | ICD-10-CM

## 2014-11-30 DIAGNOSIS — Z9842 Cataract extraction status, left eye: Secondary | ICD-10-CM

## 2014-11-30 DIAGNOSIS — J309 Allergic rhinitis, unspecified: Secondary | ICD-10-CM | POA: Diagnosis present

## 2014-11-30 DIAGNOSIS — Z79899 Other long term (current) drug therapy: Secondary | ICD-10-CM | POA: Diagnosis not present

## 2014-11-30 DIAGNOSIS — N2889 Other specified disorders of kidney and ureter: Secondary | ICD-10-CM | POA: Diagnosis present

## 2014-11-30 DIAGNOSIS — J449 Chronic obstructive pulmonary disease, unspecified: Secondary | ICD-10-CM | POA: Diagnosis present

## 2014-11-30 DIAGNOSIS — K529 Noninfective gastroenteritis and colitis, unspecified: Secondary | ICD-10-CM | POA: Diagnosis present

## 2014-11-30 DIAGNOSIS — I10 Essential (primary) hypertension: Secondary | ICD-10-CM | POA: Diagnosis present

## 2014-11-30 DIAGNOSIS — F32A Depression, unspecified: Secondary | ICD-10-CM | POA: Diagnosis present

## 2014-11-30 DIAGNOSIS — K625 Hemorrhage of anus and rectum: Secondary | ICD-10-CM | POA: Diagnosis not present

## 2014-11-30 DIAGNOSIS — K5791 Diverticulosis of intestine, part unspecified, without perforation or abscess with bleeding: Secondary | ICD-10-CM | POA: Diagnosis not present

## 2014-11-30 DIAGNOSIS — Z7901 Long term (current) use of anticoagulants: Secondary | ICD-10-CM

## 2014-11-30 DIAGNOSIS — Z9049 Acquired absence of other specified parts of digestive tract: Secondary | ICD-10-CM | POA: Diagnosis present

## 2014-11-30 LAB — COMPREHENSIVE METABOLIC PANEL
ALT: 17 U/L (ref 14–54)
AST: 25 U/L (ref 15–41)
Albumin: 3.5 g/dL (ref 3.5–5.0)
Alkaline Phosphatase: 80 U/L (ref 38–126)
Anion gap: 8 (ref 5–15)
BUN: 21 mg/dL — AB (ref 6–20)
CHLORIDE: 100 mmol/L — AB (ref 101–111)
CO2: 28 mmol/L (ref 22–32)
Calcium: 9.1 mg/dL (ref 8.9–10.3)
Creatinine, Ser: 0.73 mg/dL (ref 0.44–1.00)
GFR calc Af Amer: >60 mL/min (ref >60)
Glucose, Bld: 121 mg/dL — ABNORMAL HIGH (ref 65–99)
Potassium: 3.8 mmol/L (ref 3.5–5.1)
SODIUM: 136 mmol/L (ref 135–145)
Total Bilirubin: 0.7 mg/dL (ref 0.3–1.2)
Total Protein: 6.9 g/dL (ref 6.5–8.1)

## 2014-11-30 LAB — CBC
HCT: 34.9 % — ABNORMAL LOW (ref 36.0–46.0)
HEMOGLOBIN: 11.3 g/dL — AB (ref 12.0–15.0)
MCH: 28.5 pg (ref 26.0–34.0)
MCHC: 32.4 g/dL (ref 30.0–36.0)
MCV: 87.9 fL (ref 78.0–100.0)
PLATELETS: 437 10*3/uL — AB (ref 150–400)
RBC: 3.97 MIL/uL (ref 3.87–5.11)
RDW: 14.4 % (ref 11.5–15.5)
WBC: 8.7 10*3/uL (ref 4.0–10.5)

## 2014-11-30 LAB — CBC WITH DIFFERENTIAL/PLATELET
BASOS ABS: 0 10*3/uL (ref 0.0–0.1)
BASOS PCT: 0 % (ref 0–1)
EOS ABS: 0 10*3/uL (ref 0.0–0.7)
Eosinophils Relative: 0 % (ref 0–5)
HCT: 38.3 % (ref 36.0–46.0)
Hemoglobin: 12.5 g/dL (ref 12.0–15.0)
LYMPHS PCT: 26 % (ref 12–46)
Lymphs Abs: 2.5 10*3/uL (ref 0.7–4.0)
MCH: 28.2 pg (ref 26.0–34.0)
MCHC: 32.6 g/dL (ref 30.0–36.0)
MCV: 86.5 fL (ref 78.0–100.0)
MONO ABS: 0.9 10*3/uL (ref 0.1–1.0)
Monocytes Relative: 9 % (ref 3–12)
Neutro Abs: 6.1 10*3/uL (ref 1.7–7.7)
Neutrophils Relative %: 65 % (ref 43–77)
PLATELETS: 430 10*3/uL — AB (ref 150–400)
RBC: 4.43 MIL/uL (ref 3.87–5.11)
RDW: 14.3 % (ref 11.5–15.5)
WBC: 9.5 10*3/uL (ref 4.0–10.5)

## 2014-11-30 LAB — PROTIME-INR: PROTHROMBIN TIME: 88.6 s — AB (ref 11.6–15.2)

## 2014-11-30 LAB — TYPE AND SCREEN
ABO/RH(D): B POS
ANTIBODY SCREEN: NEGATIVE

## 2014-11-30 LAB — APTT: aPTT: 53 seconds — ABNORMAL HIGH (ref 24–37)

## 2014-11-30 MED ORDER — MORPHINE SULFATE (PF) 2 MG/ML IV SOLN
2.0000 mg | Freq: Once | INTRAVENOUS | Status: AC
  Administered 2014-11-30: 2 mg via INTRAVENOUS
  Filled 2014-11-30: qty 1

## 2014-11-30 MED ORDER — SODIUM CHLORIDE 0.9 % IV SOLN
1000.0000 mL | INTRAVENOUS | Status: DC
  Administered 2014-11-30: 1000 mL via INTRAVENOUS

## 2014-11-30 MED ORDER — LEVOTHYROXINE SODIUM 88 MCG PO TABS
88.0000 ug | ORAL_TABLET | Freq: Every day | ORAL | Status: DC
Start: 2014-12-01 — End: 2014-12-04
  Administered 2014-12-01 – 2014-12-04 (×4): 88 ug via ORAL
  Filled 2014-11-30 (×4): qty 1

## 2014-11-30 MED ORDER — HYDRALAZINE HCL 20 MG/ML IJ SOLN
5.0000 mg | INTRAMUSCULAR | Status: DC | PRN
  Administered 2014-12-03: 5 mg via INTRAVENOUS
  Filled 2014-11-30: qty 1

## 2014-11-30 MED ORDER — IOHEXOL 300 MG/ML  SOLN
100.0000 mL | Freq: Once | INTRAMUSCULAR | Status: AC | PRN
  Administered 2014-11-30: 100 mL via INTRAVENOUS

## 2014-11-30 MED ORDER — IOHEXOL 300 MG/ML  SOLN
25.0000 mL | Freq: Once | INTRAMUSCULAR | Status: AC | PRN
  Administered 2014-11-30: 25 mL via ORAL

## 2014-11-30 MED ORDER — VITAMIN K1 10 MG/ML IJ SOLN
5.0000 mg | Freq: Once | INTRAVENOUS | Status: AC
  Administered 2014-11-30: 5 mg via INTRAVENOUS
  Filled 2014-11-30: qty 0.5

## 2014-11-30 MED ORDER — ONDANSETRON HCL 4 MG/2ML IJ SOLN: 4.0000 mg | Freq: Three times a day (TID) | INTRAMUSCULAR | Status: AC | PRN

## 2014-11-30 MED ORDER — SODIUM CHLORIDE 0.9 % IV SOLN
INTRAVENOUS | Status: DC
  Administered 2014-11-30 – 2014-12-03 (×5): via INTRAVENOUS

## 2014-11-30 MED ORDER — VITAMIN K1 10 MG/ML IJ SOLN
5.0000 mg | Freq: Two times a day (BID) | INTRAMUSCULAR | Status: DC
  Administered 2014-11-30: 5 mg via INTRAVENOUS
  Filled 2014-11-30: qty 0.5

## 2014-11-30 MED ORDER — LOPERAMIDE HCL 2 MG PO CAPS: 2.0000 mg | ORAL_CAPSULE | ORAL | Status: DC | PRN

## 2014-11-30 MED ORDER — METRONIDAZOLE 500 MG PO TABS
500.0000 mg | ORAL_TABLET | Freq: Three times a day (TID) | ORAL | Status: DC
  Administered 2014-12-01 – 2014-12-03 (×7): 500 mg via ORAL
  Filled 2014-11-30 (×9): qty 1

## 2014-11-30 MED ORDER — ALPRAZOLAM 0.25 MG PO TABS
0.2500 mg | ORAL_TABLET | Freq: Three times a day (TID) | ORAL | Status: DC | PRN
  Administered 2014-12-01 – 2014-12-03 (×6): 0.25 mg via ORAL
  Filled 2014-11-30 (×6): qty 1

## 2014-11-30 MED ORDER — SODIUM CHLORIDE 0.9 % IV SOLN
1000.0000 mL | Freq: Once | INTRAVENOUS | Status: AC
  Administered 2014-11-30: 1000 mL via INTRAVENOUS

## 2014-11-30 MED ORDER — ACETAMINOPHEN 325 MG PO TABS
650.0000 mg | ORAL_TABLET | Freq: Four times a day (QID) | ORAL | Status: DC | PRN
  Administered 2014-12-03: 650 mg via ORAL
  Filled 2014-11-30: qty 2

## 2014-11-30 MED ORDER — BISOPROLOL FUMARATE 5 MG PO TABS
5.0000 mg | ORAL_TABLET | Freq: Every day | ORAL | Status: DC
  Administered 2014-12-01 – 2014-12-04 (×4): 5 mg via ORAL
  Filled 2014-11-30 (×4): qty 1

## 2014-11-30 MED ORDER — ACETAMINOPHEN 650 MG RE SUPP: 650.0000 mg | Freq: Four times a day (QID) | RECTAL | Status: DC | PRN

## 2014-11-30 MED ORDER — BENAZEPRIL HCL 40 MG PO TABS
40.0000 mg | ORAL_TABLET | Freq: Every day | ORAL | Status: DC
  Administered 2014-12-01 – 2014-12-04 (×4): 40 mg via ORAL
  Filled 2014-11-30 (×4): qty 1

## 2014-11-30 MED ORDER — IMIPRAMINE HCL 10 MG PO TABS
30.0000 mg | ORAL_TABLET | Freq: Every day | ORAL | Status: DC
  Administered 2014-12-01 – 2014-12-03 (×3): 30 mg via ORAL
  Filled 2014-11-30 (×4): qty 3

## 2014-11-30 MED ORDER — VITAMIN K1 10 MG/ML IJ SOLN: 10.0000 mg | Freq: Two times a day (BID) | INTRAVENOUS | Status: DC

## 2014-11-30 MED ORDER — VITAMIN K1 10 MG/ML IJ SOLN
10.0000 mg | Freq: Once | INTRAVENOUS | Status: DC
  Filled 2014-11-30: qty 1

## 2014-11-30 MED ORDER — CLONIDINE HCL 0.1 MG/24HR TD PTWK: 0.1000 mg | MEDICATED_PATCH | TRANSDERMAL | Status: DC

## 2014-11-30 MED ORDER — DICYCLOMINE HCL 10 MG PO CAPS
10.0000 mg | ORAL_CAPSULE | Freq: Four times a day (QID) | ORAL | Status: DC | PRN
  Administered 2014-12-01: 10 mg via ORAL
  Filled 2014-11-30 (×2): qty 1

## 2014-11-30 MED ORDER — MORPHINE SULFATE (PF) 2 MG/ML IV SOLN
1.0000 mg | INTRAVENOUS | Status: DC | PRN
  Administered 2014-11-30 – 2014-12-01 (×4): 1 mg via INTRAVENOUS
  Filled 2014-11-30 (×5): qty 1

## 2014-11-30 MED ORDER — TRAMADOL HCL 50 MG PO TABS
50.0000 mg | ORAL_TABLET | Freq: Three times a day (TID) | ORAL | Status: DC | PRN
  Administered 2014-12-01 – 2014-12-03 (×2): 50 mg via ORAL
  Filled 2014-11-30 (×2): qty 1

## 2014-11-30 MED ORDER — FENTANYL CITRATE (PF) 100 MCG/2ML IJ SOLN
12.5000 ug | Freq: Once | INTRAMUSCULAR | Status: AC
  Administered 2014-11-30: 12.5 ug via INTRAVENOUS
  Filled 2014-11-30: qty 2

## 2014-11-30 MED ORDER — HYDROXYZINE HCL 50 MG/ML IM SOLN
25.0000 mg | Freq: Four times a day (QID) | INTRAMUSCULAR | Status: DC | PRN
  Filled 2014-11-30: qty 0.5

## 2014-11-30 MED ORDER — PANTOPRAZOLE SODIUM 40 MG IV SOLR
40.0000 mg | Freq: Two times a day (BID) | INTRAVENOUS | Status: DC
  Administered 2014-11-30 – 2014-12-03 (×7): 40 mg via INTRAVENOUS
  Filled 2014-11-30 (×8): qty 40

## 2014-11-30 MED ORDER — ALBUTEROL SULFATE (2.5 MG/3ML) 0.083% IN NEBU: 2.5000 mg | INHALATION_SOLUTION | RESPIRATORY_TRACT | Status: DC | PRN

## 2014-11-30 NOTE — ED Notes (Signed)
unablel to get other labs, pt enroute to floor

## 2014-11-30 NOTE — ED Notes (Signed)
PAULA/DAUGHTER  [phone]

## 2014-11-30 NOTE — ED Notes (Signed)
Hospitalist at bedside 

## 2014-11-30 NOTE — ED Notes (Signed)
This RN called pharmacy to check compatibility of fentanyl and vitamin K , they report it is compatible.

## 2014-11-30 NOTE — ED Provider Notes (Signed)
CSN: [id number]     Arrival date & time 11/30/14  1504 History   First MD Initiated Contact with Patient 11/30/14 1518     Chief Complaint  Patient presents with  . Rectal Bleeding     (Consider location/radiation/quality/duration/timing/severity/associated sxs/prior Treatment) HPI Comments: Pt is a 81 yo female with PMH of afib, HTN, diverticulitis who presents to the ED with complaint of rectal bleeding. Pt reports she has had intermittent rectal bleeding for the past 3-4 months but notes that she noticed an increased in the amount 3 days ago. She reports having bright red blood mixed with maroon colored stool on toilet paper after BM but notes that she does not look at her stool. Pt also reports lower abdominal pain that has been present for the past 3-4 months that also worsened 3 days ago. She reports intermittent cramping pain, no aggravating factors but notes that the pain is relieved with her pain meds that she takes at home. Endorses weakness and chronic diarrhea. Denies fever, headache, sore throat, SOB, CP, N/V, dysuria, constipation, lightheadedness, numbness, tingling. Pt is on coumadin for afib.   Patient is a 81 y.o. female presenting with hematochezia.  Rectal Bleeding Associated symptoms: abdominal pain     Past Medical History  Diagnosis Date  . Osteoporosis   . Hypertension   . Hypothyroidism   . Lower GI bleeding 07/21/11  . Atrial fibrillation   . COPD (chronic obstructive pulmonary disease)   . Pneumonia   . Shortness of breath 07/21/11    "anytime"  . Sinus headache 07/21/11    "all the time"  . Bladder infection, chronic   . Arthritis     "everywhere"  . Anxiety   . Chronic lower back pain   . Chronic abdominal pain   . Renal mass, left 2013  . PUD (peptic ulcer disease)   . Chronic cystitis   . Trigeminal neuralgia     /E-chart  . Chronic kidney disease   . Anemia    Past Surgical History  Procedure Laterality Date  . Vaginal hysterectomy       partial  . Cataract extraction w/ intraocular lens  implant, bilateral    . Percutaneous pinning femoral neck fracture  11/2003    w/closed reduction ; left/E-chart  . Laceration repair  11/2003    left eyebrow  . Bilateral salpingoophorectomy  12/2005    lap/E-chart  . Cholecystectomy  1960  . Appendectomy      presumed/E-chart  . Esophagogastroduodenoscopy  07/23/2011    Procedure: ESOPHAGOGASTRODUODENOSCOPY (EGD);  Surgeon: Wonda Horner, MD;  [facility];  Service: Endoscopy;  Laterality: N/A;   History reviewed. No pertinent family history. Social History  Substance Use Topics  . Smoking status: Former Smoker -- 1.00 packs/day    Types: Cigarettes    Quit date: 09/11/2009  . Smokeless tobacco: Never Used     Comment: 07/21/11 "can't remember when I quit smoking"  . Alcohol Use: 1.2 oz/week    2 Glasses of wine per week     Comment: 07/21/11 "seldom drink"   OB History    No data available     Review of Systems  Gastrointestinal: Positive for abdominal pain, diarrhea, hematochezia and anal bleeding.  Neurological: Positive for weakness.  All other systems reviewed and are negative.     Allergies  Levofloxacin  Home Medications   Prior to Admission medications   Medication Sig Start Date End Date Taking? Authorizing Provider  ALPRAZolam Duanne Moron) 0.25  MG tablet Take 0.25 mg by mouth every 8 (eight) hours as needed for anxiety.    Yes Historical Provider, MD  benazepril (LOTENSIN) 40 MG tablet TAKE 1 TABLET BY MOUTH EVERY DAY *MUST KEEP 04-05-14 APPOINTMENT* Patient taking differently: Take 40 mg by mouth daily. TAKE 1 TABLET BY MOUTH EVERY DAY *MUST KEEP 04-05-14 APPOINTMENT* 04/20/14  Yes Mihai Croitoru, MD  bisoprolol (ZEBETA) 5 MG tablet Take 5 mg by mouth daily.   Yes Historical Provider, MD  cloNIDine (CATAPRES - DOSED IN MG/24 HR) 0.1 mg/24hr patch Place 1 patch onto the skin once a week. 11/21/14  Yes Historical Provider, MD  dicyclomine (BENTYL) 10 MG  capsule Take 10 mg by mouth 4 (four) times daily as needed for spasms.    Yes Historical Provider, MD  imipramine (TOFRANIL) 10 MG tablet Take 30 mg by mouth at bedtime. 07/11/14  Yes Historical Provider, MD  levothyroxine (SYNTHROID, LEVOTHROID) 88 MCG tablet Take 88 mcg by mouth daily before breakfast.   Yes Historical Provider, MD  loperamide (IMODIUM) 2 MG capsule Take 2 mg by mouth as needed for diarrhea or loose stools.   Yes Historical Provider, MD  metroNIDAZOLE (FLAGYL) 500 MG tablet Take 1 tablet (500 mg total) by mouth 2 (two) times daily. Patient taking differently: Take 500 mg by mouth 3 (three) times daily.  07/19/14  Yes Blanchie Dessert, MD  spironolactone (ALDACTONE) 25 MG tablet Take 25 mg by mouth 2 (two) times daily.  11/18/12  Yes Historical Provider, MD  traMADol (ULTRAM) 50 MG tablet Take 50 mg by mouth every 8 (eight) hours as needed. for pain 04/26/14  Yes Historical Provider, MD  warfarin (COUMADIN) 2.5 MG tablet Take 1 to 1.5 tablets by mouth daily as directed by coumadin clinic.  Needs INR for further refills Patient taking differently: Take 2.5 mg by mouth daily at 6 PM.  12/08/13  Yes Mihai Croitoru, MD  ciprofloxacin (CIPRO) 500 MG tablet Take 1 tablet (500 mg total) by mouth every 12 (twelve) hours. Patient not taking: Reported on 11/30/2014 07/19/14   Blanchie Dessert, MD   BP 169/101 mmHg  Pulse 91  Temp(Src)  (Oral)  Resp 18  SpO2 99% Physical Exam  Constitutional: She is oriented to person, place, and time. She appears well-developed and well-nourished.  HENT:  Head: Normocephalic and atraumatic.  Mouth/Throat: Uvula is midline and oropharynx is clear and moist. Mucous membranes are dry.  Eyes: Conjunctivae and EOM are normal. Pupils are equal, round, and reactive to light. Right eye exhibits no discharge. Left eye exhibits no discharge. No scleral icterus.  Neck: Normal range of motion. Neck supple.  Cardiovascular: Normal rate, regular rhythm, normal heart sounds  and intact distal pulses.   Pulmonary/Chest: Effort normal and breath sounds normal. She has no wheezes. She has no rales. She exhibits no tenderness.  Abdominal: Soft. Bowel sounds are normal. She exhibits no distension and no mass. There is tenderness in the right upper quadrant, right lower quadrant, epigastric area and suprapubic area. There is no rigidity, no rebound and no guarding.  Musculoskeletal: Normal range of motion. She exhibits no edema or tenderness.  Lymphadenopathy:    She has no cervical adenopathy.  Neurological: She is alert and oriented to person, place, and time.  Skin: Skin is warm and dry.  Nursing note and vitals reviewed.   ED Course  Procedures (including critical care time) Labs Review Labs Reviewed  CBC WITH DIFFERENTIAL/PLATELET - Abnormal; Notable for the following:    Platelets  430 (*)    All other components within normal limits  COMPREHENSIVE METABOLIC PANEL - Abnormal; Notable for the following:    Chloride 100 (*)    Glucose, Bld 121 (*)    BUN 21 (*)    All other components within normal limits  PROTIME-INR - Abnormal; Notable for the following:    Prothrombin Time 88.6 (*)    INR >10.00 (*)    All other components within normal limits    Imaging Review Ct Abdomen Pelvis W Contrast  11/30/2014   CLINICAL DATA:  Pt has been having rectal bleeding for 3 days. Today large amount of bright red blood.  EXAM: CT ABDOMEN AND PELVIS WITH CONTRAST  TECHNIQUE: Multidetector CT imaging of the abdomen and pelvis was performed using the standard protocol following bolus administration of intravenous contrast.  CONTRAST:  187mL OMNIPAQUE IOHEXOL 300 MG/ML  SOLN  COMPARISON:  07/19/14  FINDINGS: Lower chest:  No acute findings  Hepatobiliary: Mild intrahepatic and extrahepatic biliary dilitation, slightly more prominent when compared to the prior study. This may be the result of status post cholecystectomy.A 1cm low attenuation in the right hepatic lobe is present;  previously it appeared consistent with a cyst, but does not appear convincingly cystic on the current study. This could be due to differences in scan plane selection given the small size of this finding.  Pancreas: Mild pancreatic ductal dilatation similar to prior study  Spleen: Normal  Adrenals/Urinary Tract: Simple appearing and complex appearing cysts right kidney complex nipple showing mural calcification. Upper pole hyper attenuating left renal lesion measures about 3 cm. It shows average attenuation value. Seventy-eight. It is unchanged. Stable 1.2 cm left adrenal nodule as previously described.  Stomach/Bowel: The distal 9 cm of the sigmoid colon appears abnormal. Shows severe wall thickening with areas of heterogeneous low attenuation within the thickened wall. Proximally there is mild dilatation with large volume of stool in the immediately proximal colon. Diverticulosis in the proximal sigmoid colon. Overall there is a nonobstructive bowel gas pattern.  Vascular/Lymphatic: Moderate to severe aortoiliac calcification  Reproductive: Not seen  Other: No ascites  Musculoskeletal: No acute findings  IMPRESSION: 1. Severe wall thickening and irregularity in the distal half of the sigmoid colon. This could, as has been previously suggested, reflect diverticulitis. However, malignancy is not excluded.  2. Stable hyperattenuating left renal mass concerning for renal cell carcinoma.  3. Mild biliary and pancreatic duct dilitation, slightly more conspicuous when compared to prior study. No pancreatic head mass identified. This may be related to prior cholecystectomy.  4.  Small nonspecific right hepatic lesion not well characterized.   Electronically Signed   By: Skipper Cliche M.D.   On: 11/30/2014 18:20   I have personally reviewed and evaluated these images and lab results as part of my medical decision-making.   EKG Interpretation None      Filed Vitals:   11/30/14 1857  BP: 169/101  Pulse: 91   Resp: 18   Meds given in ED:  Medications  0.9 %  sodium chloride infusion (0 mLs Intravenous Stopped 11/30/14 1715)    Followed by  0.9 %  sodium chloride infusion (1,000 mLs Intravenous New Bag/Given 11/30/14 1718)  phytonadione (VITAMIN K) 5 mg in dextrose 5 % 50 mL IVPB (5 mg Intravenous New Bag/Given 11/30/14 1923)  fentaNYL (SUBLIMAZE) injection 12.5 mcg (not administered)  morphine 2 MG/ML injection 2 mg (2 mg Intravenous Given 11/30/14 1620)  iohexol (OMNIPAQUE) 300 MG/ML solution 25 mL (25  mLs Oral Contrast Given 11/30/14 1620)  iohexol (OMNIPAQUE) 300 MG/ML solution 100 mL (100 mLs Intravenous Contrast Given 11/30/14 1725)    New Prescriptions   No medications on file     MDM   Final diagnoses:  Gastrointestinal hemorrhage, unspecified gastritis, unspecified gastrointestinal hemorrhage type  Elevated INR    Pt presents with rectal bleeding and lower abdominal pain. History of diverticulitis. Pt reports inc. rectal bleeding over the past 3 days. She is anticoagulated on Coumadin for afib. VSS. RUQ, RLQ, epigastric and suprapubic TTP. Pt given pain meds and IVF.   CBC, CMP unremarkable. INR >10. CT reveals Severe wall thickening and irregularity in the distal half of the sigmoid colon and stable hyperattenuating left renal mass. EKG ordered. Pt given 5mg  vitamin K. GI and hospitalist consulted. Pt reports pain has not improved, given more pain meds. Plan to admit pt to hospitalist service, orders placed for tele bed. Discussed plan for admission with pt.     Chesley Noon [place], [place] 11/30/14 2008  Leonard Schwartz, MD 12/01/14 [phone]

## 2014-11-30 NOTE — ED Notes (Signed)
3 days of rectal bleeding. Today large amount of bright red blood. Pt is extremely HOH. No family with patient.

## 2014-11-30 NOTE — H&P (Signed)
[facility] History and Physical  Natalie [name]:[id number] DOB: [birth year] DOA: 11/30/2014  Referring physician: ED physician PCP: Marton Redwood, MD  Specialists:   Chief Complaint: GIB  HPI: Natalie [name] is a 81 y.o. female with PMH of afib on Coumadin, HTN, diverticulitis, GI bleeding, COPD, chronic back pain, hypothyroidism, depression, sciatica, who presents with the GI bleeding.  Patient reports that she has intermittent GI bleeding in the past 2 years. In the past several days her GIB has been worsening, she has rectal bleeding almost every day, at least once each day. She states that the stool is mixed with dark and bright red colored blood. She has mild abdominal pain in lower abdomen. She does not have chest pain, shortness of breath, dizziness, cough, fever, chills. She denies symptoms of UTI, but the nurse noticed that patient has an increased urinary frequency. Patient does not have rashes, unilateral weakness.  In ED, patient was found to have supratherapeutic INR>10, hemoglobin 12.5, WBC 9.5, no tachycardia, electrolytes okay.  CT-abdomen/pelvis showed 1. severe wall thickening and irregularity in the distal half of the sigmoid colon, suggesting diverticulitis, however, malignancy is not excluded. 2. Stable hyperattenuating left renal mass concerning for renal cell carcinoma. 3. Mild biliary and pancreatic duct dilitation, slightly more conspicuous when compared to prior study. No pancreatic head mass identified. This may be related to prior cholecystectomy. 4. Small nonspecific right hepatic lesion not well characterized. Patient isn't admitted the patient will further evaluate and treatment. GI was consulted, Dr. Cristina Gong will see patient in morning.  Where does patient live?   At home  Can patient participate in ADLs? Some   Review of Systems:   General: no fevers, chills, no changes in body weight, has poor appetite, has fatigue HEENT: no blurry vision, hearing  changes or sore throat Pulm: no dyspnea, coughing, wheezing CV: no chest pain, palpitations Abd: no nausea, vomiting, has lower abdominal pain and rectal bleeding . No diarrhea, constipation GU: no dysuria, burning on urination, has increased urinary frequency, no hematuria  Ext: has mild leg edema Neuro: no unilateral weakness, numbness, or tingling, no vision change or hearing loss Skin: no rash MSK: No muscle spasm, no deformity, no limitation of range of movement in spin Heme: No easy bruising.  Travel history: No recent long distant travel.  Allergy:  Allergies  Allergen Reactions  . Levofloxacin Other (See Comments)    REACTION: unspecified per [facility]    Past Medical History  Diagnosis Date  . Osteoporosis   . Hypertension   . Hypothyroidism   . Lower GI bleeding 07/21/11  . Atrial fibrillation   . COPD (chronic obstructive pulmonary disease)   . Pneumonia   . Shortness of breath 07/21/11    "anytime"  . Sinus headache 07/21/11    "all the time"  . Bladder infection, chronic   . Arthritis     "everywhere"  . Anxiety   . Chronic lower back pain   . Chronic abdominal pain   . Renal mass, left 2013  . PUD (peptic ulcer disease)   . Chronic cystitis   . Trigeminal neuralgia     /E-chart  . Chronic kidney disease   . Anemia     Past Surgical History  Procedure Laterality Date  . Vaginal hysterectomy      partial  . Cataract extraction w/ intraocular lens  implant, bilateral    . Percutaneous pinning femoral neck fracture  11/2003    w/closed reduction ; left/E-chart  .  Laceration repair  11/2003    left eyebrow  . Bilateral salpingoophorectomy  12/2005    lap/E-chart  . Cholecystectomy  1960  . Appendectomy      presumed/E-chart  . Esophagogastroduodenoscopy  07/23/2011    Procedure: ESOPHAGOGASTRODUODENOSCOPY (EGD);  Surgeon: Wonda Horner, MD;  Location: [facility] ENDOSCOPY;  Service: Endoscopy;  Laterality: N/A;    Social History:  reports that she quit smoking  about 5 years ago. Her smoking use included Cigarettes. She smoked 1.00 pack per day. She has never used smokeless tobacco. She reports that she drinks about 1.2 oz of alcohol per week. She reports that she does not use illicit drugs.  Family History:  Family History  Problem Relation Age of Onset  . Prostate cancer Father     Patient states that his father probably had prostate cancer     Prior to Admission medications   Medication Sig Start Date End Date Taking? Authorizing Provider  ALPRAZolam (XANAX) 0.25 MG tablet Take 0.25 mg by mouth every 8 (eight) hours as needed for anxiety.    Yes Historical Provider, MD  benazepril (LOTENSIN) 40 MG tablet TAKE 1 TABLET BY MOUTH EVERY DAY *MUST KEEP 04-05-14 APPOINTMENT* Patient taking differently: Take 40 mg by mouth daily. TAKE 1 TABLET BY MOUTH EVERY DAY *MUST KEEP 04-05-14 APPOINTMENT* 04/20/14  Yes Mihai Croitoru, MD  bisoprolol (ZEBETA) 5 MG tablet Take 5 mg by mouth daily.   Yes Historical Provider, MD  cloNIDine (CATAPRES - DOSED IN MG/24 HR) 0.1 mg/24hr patch Place 1 patch onto the skin once a week. 11/21/14  Yes Historical Provider, MD  dicyclomine (BENTYL) 10 MG capsule Take 10 mg by mouth 4 (four) times daily as needed for spasms.    Yes Historical Provider, MD  imipramine (TOFRANIL) 10 MG tablet Take 30 mg by mouth at bedtime. 07/11/14  Yes Historical Provider, MD  levothyroxine (SYNTHROID, LEVOTHROID) 88 MCG tablet Take 88 mcg by mouth daily before breakfast.   Yes Historical Provider, MD  loperamide (IMODIUM) 2 MG capsule Take 2 mg by mouth as needed for diarrhea or loose stools.   Yes Historical Provider, MD  metroNIDAZOLE (FLAGYL) 500 MG tablet Take 1 tablet (500 mg total) by mouth 2 (two) times daily. Patient taking differently: Take 500 mg by mouth 3 (three) times daily.  07/19/14  Yes Blanchie Dessert, MD  spironolactone (ALDACTONE) 25 MG tablet Take 25 mg by mouth 2 (two) times daily.  11/18/12  Yes Historical Provider, MD  traMADol  (ULTRAM) 50 MG tablet Take 50 mg by mouth every 8 (eight) hours as needed. for pain 04/26/14  Yes Historical Provider, MD  warfarin (COUMADIN) 2.5 MG tablet Take 1 to 1.5 tablets by mouth daily as directed by coumadin clinic.  Needs INR for further refills Patient taking differently: Take 2.5 mg by mouth daily at 6 PM.  12/08/13  Yes Mihai Croitoru, MD  ciprofloxacin (CIPRO) 500 MG tablet Take 1 tablet (500 mg total) by mouth every 12 (twelve) hours. Patient not taking: Reported on 11/30/2014 07/19/14   Blanchie Dessert, MD    Physical Exam: Filed Vitals:   11/30/14 1528 11/30/14 1857 11/30/14 1930 11/30/14 2101  BP:  169/101 154/97 176/106  Pulse:  91  96  Temp:    98 F (36.7 C)  TempSrc:    Oral  Resp:  18 15 18   Height:    5\' 2"  (1.575 m)  Weight:    66.679 kg (147 lb)  SpO2: 97% 99%  93%  General: Not in acute distress HEENT:       Eyes: PERRL, EOMI, no scleral icterus.       ENT: No discharge from the ears and nose, no pharynx injection, no tonsillar enlargement.        Neck: No JVD, no bruit, no mass felt. Heme: No neck lymph node enlargement. Cardiac: S1/S2, RRR, No murmurs, No gallops or rubs. Pulm: No rales, wheezing, rhonchi or rubs. Abd: Soft, nondistended, mild tenderness over lower abdomen, no rebound pain, no organomegaly, BS present. Ext: has trace leg edema bilaterally. 2+DP/PT pulse bilaterally. Musculoskeletal: No joint deformities, No joint redness or warmth, no limitation of ROM in spin. Skin: No rashes.  Neuro: Alert, oriented X3, cranial nerves II-XII grossly intact, muscle strength 5/5 in all extremities, sensation to light touch intact.  Psych: Patient is not psychotic, no suicidal or hemocidal ideation.  Labs on Admission:  Basic Metabolic Panel:  Recent Labs Lab 11/30/14 1611  NA 136  K 3.8  CL 100*  CO2 28  GLUCOSE 121*  BUN 21*  CREATININE 0.73  CALCIUM 9.1   Liver Function Tests:  Recent Labs Lab 11/30/14 1611  AST 25  ALT 17  ALKPHOS  80  BILITOT 0.7  PROT 6.9  ALBUMIN 3.5   No results for input(s): LIPASE, AMYLASE in the last 168 hours. No results for input(s): AMMONIA in the last 168 hours. CBC:  Recent Labs Lab 11/30/14 1611 11/30/14 2240  WBC 9.5 8.7  NEUTROABS 6.1  --   HGB 12.5 11.3*  HCT 38.3 34.9*  MCV 86.5 87.9  PLT 430* 437*   Cardiac Enzymes: No results for input(s): CKTOTAL, CKMB, CKMBINDEX, TROPONINI in the last 168 hours.  BNP (last 3 results) No results for input(s): BNP in the last 8760 hours.  ProBNP (last 3 results) No results for input(s): PROBNP in the last 8760 hours.  CBG: No results for input(s): GLUCAP in the last 168 hours.  Radiological Exams on Admission: Ct Abdomen Pelvis W Contrast  11/30/2014   CLINICAL DATA:  Pt has been having rectal bleeding for 3 days. Today large amount of bright red blood.  EXAM: CT ABDOMEN AND PELVIS WITH CONTRAST  TECHNIQUE: Multidetector CT imaging of the abdomen and pelvis was performed using the standard protocol following bolus administration of intravenous contrast.  CONTRAST:  175mL OMNIPAQUE IOHEXOL 300 MG/ML  SOLN  COMPARISON:  07/19/14  FINDINGS: Lower chest:  No acute findings  Hepatobiliary: Mild intrahepatic and extrahepatic biliary dilitation, slightly more prominent when compared to the prior study. This may be the result of status post cholecystectomy.A 1cm low attenuation in the right hepatic lobe is present; previously it appeared consistent with a cyst, but does not appear convincingly cystic on the current study. This could be due to differences in scan plane selection given the small size of this finding.  Pancreas: Mild pancreatic ductal dilatation similar to prior study  Spleen: Normal  Adrenals/Urinary Tract: Simple appearing and complex appearing cysts right kidney complex nipple showing mural calcification. Upper pole hyper attenuating left renal lesion measures about 3 cm. It shows average attenuation value. Seventy-eight. It is  unchanged. Stable 1.2 cm left adrenal nodule as previously described.  Stomach/Bowel: The distal 9 cm of the sigmoid colon appears abnormal. Shows severe wall thickening with areas of heterogeneous low attenuation within the thickened wall. Proximally there is mild dilatation with large volume of stool in the immediately proximal colon. Diverticulosis in the proximal sigmoid colon. Overall there is a nonobstructive  bowel gas pattern.  Vascular/Lymphatic: Moderate to severe aortoiliac calcification  Reproductive: Not seen  Other: No ascites  Musculoskeletal: No acute findings  IMPRESSION: 1. Severe wall thickening and irregularity in the distal half of the sigmoid colon. This could, as has been previously suggested, reflect diverticulitis. However, malignancy is not excluded.  2. Stable hyperattenuating left renal mass concerning for renal cell carcinoma.  3. Mild biliary and pancreatic duct dilitation, slightly more conspicuous when compared to prior study. No pancreatic head mass identified. This may be related to prior cholecystectomy.  4.  Small nonspecific right hepatic lesion not well characterized.   Electronically Signed   By: Skipper Cliche M.D.   On: 11/30/2014 18:20    EKG: Independently reviewed.  Abnormal findings: QTC 494, EKG is with poor quality, but seems to have right bundle blockage (patient had right bundle blockage in previous EKG).  Assessment/Plan Principal Problem:   GIB (gastrointestinal bleeding) Active Problems:   Hypothyroidism   Anxiety state   Depression   Essential hypertension   Allergic rhinitis   Renal mass, left   Chronic back pain   Atrial fibrillation, persistent with asymptomatic pauses   Warfarin anticoagulation   History of gastric ulcer   COPD (chronic obstructive pulmonary disease)   Peripheral venous insufficiency   Elevated INR  GIB (gastrointestinal bleeding): This is likely triggered by supratherapeutic INR secondary to Coumadin use. She has  history of lower GI bleeding, peptic ulcer, ongoing diverticulitis, all are possible sources of bleeding. She may also have colon CA as suggested by CT abdomen/pelvis. Currently patient is hemodynamically stable. Hemoglobin is 12.5 on admission.   - will admit to tele bed - GI consulted by Ed, will follow up recommendations - NPO  - IVF: 1L NS and then NS at 68mL/hr - Start IV pantoprazole 40 mg bib - prn hydroxyzine for nausea and morphine for pain - Maintain IV access (2 large bore IVs if possible). - Monitor closely and follow q6h cbc, transfuse as necessary. Goal of hemoglobin for transfusion is < 7.0 - LaB: INR, PTT - hold coumadin and give Vk 5 mg x 2 to reverse INR - FOBT  Diverticulitis: Patient was started metronidazole on 8/29, supposed to take for 14 days. -Continue diverticulitis -Will check C. difficile PCR if develops diarrhea (not ordered)  Hypothyroidism: Last TSH was 2.765 on 08/28/11 -Continue home Synthroid  Depression and anxiety: Stable, no suicidal or homicidal ideations. -Continue home medications: Xanax, imipramine  Essential hypertension:  -Lotensen, Zebeta, clonidine -Hydralazine IV when necessary -Patient is on spironolactone for unknown reason, will hold now  Renal mass, left: -May give referral to oncology as oupt  Atrial Fibrillation: CHA2DS2-VASc Score is 4, needs oral anticoagulation. Patient is on Coumadin. INR is >10 on admission. Heart rate is well controlled. -Hold coumadin and reverse INR due to GIB -continue Zebata  History of gastric ulcer: -On protonix  COPD (chronic obstructive pulmonary disease): stable. -When necessary Albuterol nebulizer  Increased urinary frequency: -check UA. If positive, will start Abx  DVT ppx: SCD  Code Status: Full code Family Communication: None at bed side. Disposition Plan: Admit to inpatient   Date of Service 11/30/2014    Ivor Costa [facility] Pager [phone]  If 7PM-7AM, please  contact night-coverage [email or web address] Password [facility] 11/30/2014, 11:33 PM

## 2014-11-30 NOTE — ED Notes (Signed)
Pt sitting on bedpan, pt reports that it is difficult for her to urinate and is requesting to be catheterized. This RN made patient aware that she may try to use the bedpan a little longer but there is an increased risk for infection for catheter use and that option would not be best currently.

## 2014-12-01 DIAGNOSIS — R791 Abnormal coagulation profile: Secondary | ICD-10-CM

## 2014-12-01 DIAGNOSIS — E039 Hypothyroidism, unspecified: Secondary | ICD-10-CM

## 2014-12-01 DIAGNOSIS — N2889 Other specified disorders of kidney and ureter: Secondary | ICD-10-CM

## 2014-12-01 DIAGNOSIS — N289 Disorder of kidney and ureter, unspecified: Secondary | ICD-10-CM

## 2014-12-01 DIAGNOSIS — N390 Urinary tract infection, site not specified: Secondary | ICD-10-CM

## 2014-12-01 DIAGNOSIS — Z7901 Long term (current) use of anticoagulants: Secondary | ICD-10-CM

## 2014-12-01 DIAGNOSIS — I481 Persistent atrial fibrillation: Secondary | ICD-10-CM

## 2014-12-01 DIAGNOSIS — K5732 Diverticulitis of large intestine without perforation or abscess without bleeding: Principal | ICD-10-CM

## 2014-12-01 DIAGNOSIS — I4891 Unspecified atrial fibrillation: Secondary | ICD-10-CM

## 2014-12-01 LAB — CBC
HCT: 33.9 % — ABNORMAL LOW (ref 36.0–46.0)
HCT: 32.6 % — ABNORMAL LOW (ref 36.0–46.0)
HCT: 34.7 % — ABNORMAL LOW (ref 36.0–46.0)
HEMATOCRIT: 38.5 % (ref 36.0–46.0)
HEMOGLOBIN: 12.3 g/dL (ref 12.0–15.0)
HEMOGLOBIN: 10.3 g/dL — AB (ref 12.0–15.0)
Hemoglobin: 11.2 g/dL — ABNORMAL LOW (ref 12.0–15.0)
Hemoglobin: 11.1 g/dL — ABNORMAL LOW (ref 12.0–15.0)
MCH: 30.1 pg (ref 26.0–34.0)
MCH: 28.3 pg (ref 26.0–34.0)
MCH: 27.7 pg (ref 26.0–34.0)
MCH: 28 pg (ref 26.0–34.0)
MCHC: 33 g/dL (ref 30.0–36.0)
MCHC: 31.9 g/dL (ref 30.0–36.0)
MCHC: 31.6 g/dL (ref 30.0–36.0)
MCHC: 32 g/dL (ref 30.0–36.0)
MCV: 91.1 fL (ref 78.0–100.0)
MCV: 88.7 fL (ref 78.0–100.0)
MCV: 87.6 fL (ref 78.0–100.0)
MCV: 87.4 fL (ref 78.0–100.0)
PLATELETS: 248 10*3/uL (ref 150–400)
PLATELETS: 392 10*3/uL (ref 150–400)
PLATELETS: 429 10*3/uL — AB (ref 150–400)
Platelets: 420 10*3/uL — ABNORMAL HIGH (ref 150–400)
RBC: 3.72 MIL/uL — AB (ref 3.87–5.11)
RBC: 4.34 MIL/uL (ref 3.87–5.11)
RBC: 3.72 MIL/uL — ABNORMAL LOW (ref 3.87–5.11)
RBC: 3.97 MIL/uL (ref 3.87–5.11)
RDW: 14.8 % (ref 11.5–15.5)
RDW: 14.6 % (ref 11.5–15.5)
RDW: 14.5 % (ref 11.5–15.5)
RDW: 14.5 % (ref 11.5–15.5)
WBC: 10.3 10*3/uL (ref 4.0–10.5)
WBC: 9.6 10*3/uL (ref 4.0–10.5)
WBC: 8.8 10*3/uL (ref 4.0–10.5)
WBC: 15.3 10*3/uL — AB (ref 4.0–10.5)

## 2014-12-01 LAB — BASIC METABOLIC PANEL
ANION GAP: 6 (ref 5–15)
BUN: 28 mg/dL — AB (ref 6–20)
CHLORIDE: 97 mmol/L — AB (ref 101–111)
CO2: 31 mmol/L (ref 22–32)
Calcium: 9.8 mg/dL (ref 8.9–10.3)
Creatinine, Ser: 1.09 mg/dL — ABNORMAL HIGH (ref 0.44–1.00)
GFR calc Af Amer: 53 mL/min — ABNORMAL LOW (ref >60)
GFR, EST NON AFRICAN AMERICAN: 46 mL/min — AB (ref >60)
GLUCOSE: 115 mg/dL — AB (ref 65–99)
POTASSIUM: 3.8 mmol/L (ref 3.5–5.1)
Sodium: 134 mmol/L — ABNORMAL LOW (ref 135–145)

## 2014-12-01 LAB — URINALYSIS, ROUTINE W REFLEX MICROSCOPIC
BILIRUBIN URINE: NEGATIVE
Glucose, UA: NEGATIVE mg/dL
KETONES UR: 15 mg/dL — AB
NITRITE: POSITIVE — AB
PH: 5.5 (ref 5.0–8.0)
PROTEIN: 30 mg/dL — AB
Specific Gravity, Urine: 1.038 — ABNORMAL HIGH (ref 1.005–1.030)
UROBILINOGEN UA: 0.2 mg/dL (ref 0.0–1.0)

## 2014-12-01 LAB — URINE MICROSCOPIC-ADD ON

## 2014-12-01 LAB — PROTIME-INR
INR: 2.46 — AB (ref 0.00–1.49)
Prothrombin Time: 26.3 seconds — ABNORMAL HIGH (ref 11.6–15.2)

## 2014-12-01 LAB — GLUCOSE, CAPILLARY: GLUCOSE-CAPILLARY: 77 mg/dL (ref 65–99)

## 2014-12-01 MED ORDER — HYDROMORPHONE HCL 1 MG/ML IJ SOLN
1.0000 mg | Freq: Once | INTRAMUSCULAR | Status: AC
Start: 2014-12-01 — End: 2014-12-01
  Administered 2014-12-01: 1 mg via INTRAVENOUS
  Filled 2014-12-01: qty 1

## 2014-12-01 MED ORDER — CEFTRIAXONE SODIUM IN DEXTROSE 40 MG/ML IV SOLN
2.0000 g | INTRAVENOUS | Status: DC
  Administered 2014-12-01 – 2014-12-02 (×2): 2 g via INTRAVENOUS
  Filled 2014-12-01 (×4): qty 50

## 2014-12-01 NOTE — Consult Note (Signed)
Referring Provider:   Dr. Georgiann Mohs ([facility]) Primary Care Physician:  Marton Redwood, MD Primary Gastroenterologist:  None (unassigned)  Reason for Consultation:  Rectal bleeding and abnormal radiographic appearance of the colon.  HPI: Natalie [name] is a 81 y.o. female admitted to the hospital through the emergency room yesterday because of recurrent rectal bleeding, a problem she has had intermittently in the past but has become more frequent lately.   She is on Coumadin for atrial fibrillation, and in the emergency room, her INR was markedly elevated, over 10. Somewhat surprisingly, her hemoglobin was essentially baseline at 12.5. A CT of the abdomen, performed because of some left-sided abdominal pain, showed wall thickening and irregularity in the distal sigmoid colon possibly related to diverticular disease, although malignancy was not excluded.  Because of the bleeding and more especially because of the concern of possible malignancy in her sigmoid colon, we were asked to see the patient. She does not have a regular gastroenterologist. She had a colonoscopy at least 8 or 10 years ago, or more. Neither the patient nor her daughter remember where it was done or who did it.  Overnight, the patient's INR has been corrected to 2.5. The hemoglobin has come down slightly.     Past Medical History  Diagnosis Date  . Osteoporosis   . Hypertension   . Hypothyroidism   . Lower GI bleeding 07/21/11  . Atrial fibrillation   . COPD (chronic obstructive pulmonary disease)   . Pneumonia   . Shortness of breath 07/21/11    "anytime"  . Sinus headache 07/21/11    "all the time"  . Bladder infection, chronic   . Arthritis     "everywhere"  . Anxiety   . Chronic lower back pain   . Chronic abdominal pain   . Renal mass, left 2013  . PUD (peptic ulcer disease)   . Chronic cystitis   . Trigeminal neuralgia     /E-chart  . Chronic kidney disease   . Anemia     Past Surgical  History  Procedure Laterality Date  . Vaginal hysterectomy      partial  . Cataract extraction w/ intraocular lens  implant, bilateral    . Percutaneous pinning femoral neck fracture  11/2003    w/closed reduction ; left/E-chart  . Laceration repair  11/2003    left eyebrow  . Bilateral salpingoophorectomy  12/2005    lap/E-chart  . Cholecystectomy  1960  . Appendectomy      presumed/E-chart  . Esophagogastroduodenoscopy  07/23/2011    Procedure: ESOPHAGOGASTRODUODENOSCOPY (EGD);  Surgeon: Wonda Horner, MD;  Location: [facility] ENDOSCOPY;  Service: Endoscopy;  Laterality: N/A;    Prior to Admission medications   Medication Sig Start Date End Date Taking? Authorizing Provider  ALPRAZolam (XANAX) 0.25 MG tablet Take 0.25 mg by mouth every 8 (eight) hours as needed for anxiety.    Yes Historical Provider, MD  benazepril (LOTENSIN) 40 MG tablet TAKE 1 TABLET BY MOUTH EVERY DAY *MUST KEEP 04-05-14 APPOINTMENT* Patient taking differently: Take 40 mg by mouth daily. TAKE 1 TABLET BY MOUTH EVERY DAY *MUST KEEP 04-05-14 APPOINTMENT* 04/20/14  Yes Mihai Croitoru, MD  bisoprolol (ZEBETA) 5 MG tablet Take 5 mg by mouth daily.   Yes Historical Provider, MD  cloNIDine (CATAPRES - DOSED IN MG/24 HR) 0.1 mg/24hr patch Place 1 patch onto the skin once a week. 11/21/14  Yes Historical Provider, MD  dicyclomine (BENTYL) 10 MG capsule Take 10 mg by mouth  4 (four) times daily as needed for spasms.    Yes Historical Provider, MD  imipramine (TOFRANIL) 10 MG tablet Take 30 mg by mouth at bedtime. 07/11/14  Yes Historical Provider, MD  levothyroxine (SYNTHROID, LEVOTHROID) 88 MCG tablet Take 88 mcg by mouth daily before breakfast.   Yes Historical Provider, MD  loperamide (IMODIUM) 2 MG capsule Take 2 mg by mouth as needed for diarrhea or loose stools.   Yes Historical Provider, MD  metroNIDAZOLE (FLAGYL) 500 MG tablet Take 1 tablet (500 mg total) by mouth 2 (two) times daily. Patient taking differently: Take 500 mg by mouth  3 (three) times daily.  07/19/14  Yes Blanchie Dessert, MD  spironolactone (ALDACTONE) 25 MG tablet Take 25 mg by mouth 2 (two) times daily.  11/18/12  Yes Historical Provider, MD  traMADol (ULTRAM) 50 MG tablet Take 50 mg by mouth every 8 (eight) hours as needed. for pain 04/26/14  Yes Historical Provider, MD  warfarin (COUMADIN) 2.5 MG tablet Take 1 to 1.5 tablets by mouth daily as directed by coumadin clinic.  Needs INR for further refills Patient taking differently: Take 2.5 mg by mouth daily at 6 PM.  12/08/13  Yes Mihai Croitoru, MD  ciprofloxacin (CIPRO) 500 MG tablet Take 1 tablet (500 mg total) by mouth every 12 (twelve) hours. Patient not taking: Reported on 11/30/2014 07/19/14   Blanchie Dessert, MD    Current Facility-Administered Medications  Medication Dose Route Frequency Provider Last Rate Last Dose  . 0.9 %  sodium chloride infusion   Intravenous Continuous Ivor Costa, MD 75 mL/hr at 12/01/14 1128    . acetaminophen (TYLENOL) tablet 650 mg  650 mg Oral Q6H PRN Ivor Costa, MD       Or  . acetaminophen (TYLENOL) suppository 650 mg  650 mg Rectal Q6H PRN Ivor Costa, MD      . albuterol (PROVENTIL) (2.5 MG/3ML) 0.083% nebulizer solution 2.5 mg  2.5 mg Nebulization Q4H PRN Ivor Costa, MD      . ALPRAZolam Duanne Moron) tablet 0.25 mg  0.25 mg Oral Q8H PRN Ivor Costa, MD   0.25 mg at 12/01/14 1124  . benazepril (LOTENSIN) tablet 40 mg  40 mg Oral Daily Ivor Costa, MD   40 mg at 12/01/14 1052  . bisoprolol (ZEBETA) tablet 5 mg  5 mg Oral Daily Ivor Costa, MD   5 mg at 12/01/14 1052  . cefTRIAXone (ROCEPHIN) 2 g in dextrose 5 % 50 mL IVPB - Premix  2 g Intravenous Q24H Oswald Hillock, MD   2 g at 12/01/14 1538  . cloNIDine (CATAPRES - Dosed in mg/24 hr) patch 0.1 mg  0.1 mg Transdermal Weekly Ivor Costa, MD      . dicyclomine (BENTYL) capsule 10 mg  10 mg Oral QID PRN Ivor Costa, MD      . hydrALAZINE (APRESOLINE) injection 5 mg  5 mg Intravenous Q2H PRN Ivor Costa, MD      . hydrOXYzine (VISTARIL) injection  25 mg  25 mg Intramuscular Q6H PRN Ivor Costa, MD      . imipramine (TOFRANIL) tablet 30 mg  30 mg Oral QHS Ivor Costa, MD   30 mg at 11/30/14 2255  . levothyroxine (SYNTHROID, LEVOTHROID) tablet 88 mcg  88 mcg Oral QAC breakfast Ivor Costa, MD   88 mcg at 12/01/14 1052  . loperamide (IMODIUM) capsule 2 mg  2 mg Oral PRN Ivor Costa, MD      . metroNIDAZOLE (FLAGYL) tablet 500 mg  500  mg Oral TID Ivor Costa, MD   500 mg at 12/01/14 1537  . morphine 2 MG/ML injection 1 mg  1 mg Intravenous Q3H PRN Ivor Costa, MD   1 mg at 12/01/14 1736  . pantoprazole (PROTONIX) injection 40 mg  40 mg Intravenous Q12H Ivor Costa, MD   40 mg at 12/01/14 1051  . traMADol (ULTRAM) tablet 50 mg  50 mg Oral Q8H PRN Ivor Costa, MD   50 mg at 12/01/14 0401    Allergies as of 11/30/2014 - Review Complete 11/30/2014  Allergen Reaction Noted  . Levofloxacin Other (See Comments)     Family History  Problem Relation Age of Onset  . Prostate cancer Father     Patient states that his father probably had prostate cancer    Social History   Social History  . Marital Status: Widowed    Spouse Name: N/A  . Number of Children: N/A  . Years of Education: N/A   Occupational History  . Not on file.   Social History Main Topics  . Smoking status: Former Smoker -- 1.00 packs/day    Types: Cigarettes    Quit date: 09/11/2009  . Smokeless tobacco: Never Used     Comment: 07/21/11 "can't remember when I quit smoking"  . Alcohol Use: 1.2 oz/week    2 Glasses of wine per week     Comment: 07/21/11 "seldom drink"  . Drug Use: No  . Sexual Activity: No   Other Topics Concern  . Not on file   Social History Narrative    Review of Systems: See history of present illness  Physical Exam: Vital signs in last 24 hours: Temp:  [98 F (36.7 C)-98.9 F (37.2 C)] 98.9 F (37.2 C) (09/10 1319) Pulse Rate:  [87-109] 87 (09/10 1319) Resp:  [15-20] 20 (09/10 1319) BP: (127-176)/(86-106) 127/86 mmHg (09/10 1319) SpO2:  [93 %-99  %] 94 % (09/10 1319) Weight:  [66.679 kg (147 lb)] 66.679 kg (147 lb) (09/09 2101) Last BM Date: 12/01/14 General:   Alert,  Well-developed, well-nourished, pleasant and cooperative in NAD Head:  Normocephalic and atraumatic. Eyes:  Sclera clear, no icterus.   Conjunctiva pink. Mouth:   No ulcerations or lesions.  Oropharynx pink & moist. Neck:   No masses or thyromegaly. Lungs:  Clear throughout to auscultation.   No wheezes, crackles, or rhonchi. No evident respiratory distress. Heart:   Regular rate and rhythm; no murmurs, clicks, rubs,  or gallops. Abdomen:  Soft, nontender, no masses, hepatosplenomegaly or ventral hernias noted. No bruits, guarding, or rebound.   Msk:   Symmetrical without gross deformities. Pulses:  Normal pulses noted. Extremities:   Without clubbing, cyanosis, or edema. Neurologic:  Alert and coherent; cognition seems grossly intact, and grossly normal neurologically. Skin:  Intact without significant lesions or rashes. Cervical Nodes:  No significant cervical adenopathy. Psych:   Alert and cooperative. Normal mood and affect. Asking when she can go home. She seems sort of "fed up" with being in the hospital  Intake/Output from previous day: 09/09 0701 - 09/10 0700 In: 1691.3 [I.V.:1691.3] Out: 549 [Urine:549] Intake/Output this shift: Total I/O In: 1355 [P.O.:480; I.V.:825; IV Piggyback:50] Out: 275 [Urine:275]  Lab Results:  Recent Labs  12/01/14 0514 12/01/14 0951 12/01/14 1442  WBC 10.3 9.6 8.8  HGB 11.2* 12.3 10.3*  HCT 33.9* 38.5 32.6*  PLT 248 420* 392   BMET  Recent Labs  11/30/14 1611 12/01/14 0800  NA 136 134*  K 3.8 3.8  CL  100* 97*  CO2 28 31  GLUCOSE 121* 115*  BUN 21* 28*  CREATININE 0.73 1.09*  CALCIUM 9.1 9.8   LFT  Recent Labs  11/30/14 1611  PROT 6.9  ALBUMIN 3.5  AST 25  ALT 17  ALKPHOS 80  BILITOT 0.7   PT/INR  Recent Labs  11/30/14 1611 12/01/14 0709  LABPROT 88.6* 26.3*  INR >10.00* 2.46*     Studies/Results: Ct Abdomen Pelvis W Contrast  11/30/2014   CLINICAL DATA:  Pt has been having rectal bleeding for 3 days. Today large amount of bright red blood.  EXAM: CT ABDOMEN AND PELVIS WITH CONTRAST  TECHNIQUE: Multidetector CT imaging of the abdomen and pelvis was performed using the standard protocol following bolus administration of intravenous contrast.  CONTRAST:  140mL OMNIPAQUE IOHEXOL 300 MG/ML  SOLN  COMPARISON:  07/19/14  FINDINGS: Lower chest:  No acute findings  Hepatobiliary: Mild intrahepatic and extrahepatic biliary dilitation, slightly more prominent when compared to the prior study. This may be the result of status post cholecystectomy.A 1cm low attenuation in the right hepatic lobe is present; previously it appeared consistent with a cyst, but does not appear convincingly cystic on the current study. This could be due to differences in scan plane selection given the small size of this finding.  Pancreas: Mild pancreatic ductal dilatation similar to prior study  Spleen: Normal  Adrenals/Urinary Tract: Simple appearing and complex appearing cysts right kidney complex nipple showing mural calcification. Upper pole hyper attenuating left renal lesion measures about 3 cm. It shows average attenuation value. Seventy-eight. It is unchanged. Stable 1.2 cm left adrenal nodule as previously described.  Stomach/Bowel: The distal 9 cm of the sigmoid colon appears abnormal. Shows severe wall thickening with areas of heterogeneous low attenuation within the thickened wall. Proximally there is mild dilatation with large volume of stool in the immediately proximal colon. Diverticulosis in the proximal sigmoid colon. Overall there is a nonobstructive bowel gas pattern.  Vascular/Lymphatic: Moderate to severe aortoiliac calcification  Reproductive: Not seen  Other: No ascites  Musculoskeletal: No acute findings  IMPRESSION: 1. Severe wall thickening and irregularity in the distal half of the sigmoid  colon. This could, as has been previously suggested, reflect diverticulitis. However, malignancy is not excluded.  2. Stable hyperattenuating left renal mass concerning for renal cell carcinoma.  3. Mild biliary and pancreatic duct dilitation, slightly more conspicuous when compared to prior study. No pancreatic head mass identified. This may be related to prior cholecystectomy.  4.  Small nonspecific right hepatic lesion not well characterized.   Electronically Signed   By: Skipper Cliche M.D.   On: 11/30/2014 18:20    Impression: 1. Recurrent small volume hematochezia which has been persistent for some time, accentuated by over-anticoagulation 2. Abnormal radiographic appearance of the sigmoid colon on CT scan (reviewed), probably the result of diverticular disease, with some possibility for malignancy 3. Chronic anticoagulation with transient severe over anticoagulation, now in the therapeutic range  Plan: I spoke with the patient in detail at the bedside and then, for about 10 minutes on the telephone with the patient's daughter, Nevin Bloodgood.   The patient is quite adamant that she does not want colonoscopic testing, and just wants to go home.   I have recommended that she be allowed to go home once she is medically stable from the anticoagulation standpoint. I would favor outpatient Hemoccults and hemoglobin monitoring. If such testing shows evidence of ongoing occult blood loss when the patient is therapeutically anticoagulated  and not over anticoagulated, I then think further consideration would have to be given to evaluation of the colon, either with a barium enema or a colonoscopy. The patient's daughter,Paula, is agreeable to this course of action but wanted to speak with her sister and get back to me by telephone tomorrow with the final decision.  In my opinion, although I do believe the patient could be colonoscoped with reasonable safety, especially using an ultraslim colonoscope and propofol  sedation, the likelihood of finding a malignancy on such testing is quite low, so I am somewhat reluctant to put her through. However, I think the patient is in reasonably sound medical shape such that, if she did have a malignancy, she would probably be a surgical candidate so I don't think colonoscopy would be unreasonable or and appropriate.  I believe the hemeoccult monitoring will be a happy medium between doing unnecessary testing and ignoring an underlying medical issue.  Time spent evaluating the patient and her medical record, reviewing her CT, discussing options with the patient, and discussing options with the patient's daughter on the telephone, totalled approximately 35 minutes.     LOS: 1 day   Brandyn Thien V  12/01/2014, 6:26 PM   Pager [phone] If no answer or after 5 PM call [phone]

## 2014-12-01 NOTE — Progress Notes (Signed)
Heart rate in the 140s when patient is up to the [facility], back in the bed its down in the  90s-109.

## 2014-12-01 NOTE — Progress Notes (Signed)
Patient voiding small frequent urine, feels that she is not emptying all, C/O lower abd pain, PRN pain med given bladder scan for 189, and Dr. Blaine Hamper notified as well. I/O cath 350cc. Will continue to assess patient.

## 2014-12-01 NOTE — Evaluation (Signed)
Physical Therapy Evaluation Patient Details Name: CHENEY GOSCH MRN: [id number] DOB: 1930/04/26 Today's Date: 12/01/2014   History of Present Illness  81 yo female admitted with GI bleed.   Clinical Impression  On eval, pt required Min-Mod assist for mobility-performed stand pivot x2, bed<>bsc. Pt declined ambulation. Discussed d/c plan-pt states she will return home with assistance from daughter. Recommend HHPT and 24 hour supervision/assist at this time-as long as daughter can provide current level of care.     Follow Up Recommendations Home health PT;Supervision/Assistance - 24 hour    Equipment Recommendations  None recommended by PT    Recommendations for Other Services OT consult     Precautions / Restrictions Precautions Precautions: Fall Restrictions Weight Bearing Restrictions: No      Mobility  Bed Mobility Overal bed mobility: Needs Assistance Bed Mobility: Supine to Sit     Supine to sit: Min assist     General bed mobility comments: Assist for trunk to upright and scooting to EOB. Increased time.   Transfers Overall transfer level: Needs assistance Equipment used: Rolling walker (2 wheeled) Transfers: Sit to/from Stand Sit to Stand: Mod assist;From elevated surface         General transfer comment: Assist to rise, stabilize, control descent. Increased time. Multimodal cues for safety, technique, hand placement. Stand pivot x2, bed<>bsc (once with HHA, once with RW). Pt reaching out for bedrail instead of using walker despite cueing.   Ambulation/Gait             General Gait Details: NT-pt declined ambulation  Stairs            Wheelchair Mobility    Modified Rankin (Stroke Patients Only)       Balance Overall balance assessment: Needs assistance         Standing balance support: Bilateral upper extremity supported;During functional activity Standing balance-Leahy Scale: Poor                                Pertinent Vitals/Pain Pain Assessment: No/denies pain    Home Living Family/patient expects to be discharged to:: Private residence Living Arrangements: Children   Type of Home: House Home Access: Stairs to enter Entrance Stairs-Rails: Right Entrance Stairs-Number of Steps: 3 Home Layout: Two level Home Equipment: Environmental consultant - 2 wheels;Cane - single point      Prior Function Level of Independence: Independent with assistive device(s)         Comments: uses walker and is able to perform bathing/dressing     Hand Dominance        Extremity/Trunk Assessment   Upper Extremity Assessment: Defer to OT evaluation           Lower Extremity Assessment: Generalized weakness         Communication   Communication: HOH  Cognition Arousal/Alertness: Awake/alert Behavior During Therapy: WFL for tasks assessed/performed Overall Cognitive Status: No family/caregiver present to determine baseline cognitive functioning Area of Impairment: Orientation Orientation Level: Time                  General Comments      Exercises        Assessment/Plan    PT Assessment Patient needs continued PT services  PT Diagnosis Difficulty walking;Generalized weakness   PT Problem List Decreased strength;Decreased activity tolerance;Decreased balance;Decreased mobility;Decreased knowledge of use of DME  PT Treatment Interventions DME instruction;Gait training;Functional mobility training;Therapeutic activities;Patient/family education;Balance training;Therapeutic exercise  PT Goals (Current goals can be found in the Care Plan section) Acute Rehab PT Goals Patient Stated Goal: to go back to bed and sleep PT Goal Formulation: With patient Time For Goal Achievement: 12/15/14 Potential to Achieve Goals: Good    Frequency Min 3X/week   Barriers to discharge        Co-evaluation               End of Session   Activity Tolerance: Patient limited by  fatigue Patient left: in bed;with call bell/phone within reach;with bed alarm set           Time: 1520-1535 PT Time Calculation (min) (ACUTE ONLY): 15 min   Charges:   PT Evaluation $Initial PT Evaluation Tier I: 1 Procedure     PT G Codes:        Weston Anna, MPT Pager: [phone]

## 2014-12-01 NOTE — Progress Notes (Signed)
[facility] PROGRESS NOTE  Natalie [name] KPT:[id number] DOB: 1930/12/21 DOA: 11/30/2014 PCP: Marton Redwood, MD  Assessment/Plan: 1. Diverticulitis- CT scan shows sigmoid diverticulitis, patient was started on Flagyl as outpatient. Start ceftriaxone 1 g IV daily. 2. GI bleed- patient complained of intermittent bleeding mixed with the stool, likely from diverticulitis. She has a history of diverticular disease. Hemoglobin is stable. Patient's vitals are stable. GI was called last night Dr. Cristina Gong to see the patient today. Continue Protonix. 3. Coagulopathy- patient excluding for A. Fib, came with INR greater than 10. Patient received vitamin K, this morning is 2.46 4. UTI- this morning patient found to have abnormal UA with past nitrite. Will obtain urine culture. Patient has been started on empiric ceftriaxone. 5. Atrial fibrillation- CHA2DS2-VASc Score is 4. Patient on Coumadin. Currently, doesn't hold G I bleed.  6. COPD -stable, continue when necessary albuterol  7. Hypertension- blood pressure is stable continue Lotensin, Zometa, Catapres. Aldactone is on hold 8. Renal mass-  CT scan shows left renal mass concerning for renal cell carcinoma, has seen Urology Dr Williams Che as outpatient as per daughter. 9. Hypothyroidism- continue Synthroid 10. DVT prophylaxis- SCDs  Code Status: Full code Family Communication: Spoke to daughter on phone Disposition Plan: Home when medically stable   Consultants:  GI  Procedures:  None  Antibiotics:  Flagyl  Ceftriaxone  HPI/Subjective: 81 y.o. female with PMH of afib on Coumadin, HTN, diverticulitis, GI bleeding, COPD, chronic back pain, hypothyroidism, depression, sciatica, who presents with the GI bleeding.  Patient reports that she has intermittent GI bleeding in the past 2 years. In the past several days her GIB has been worsening, she has rectal bleeding almost every day, at least once each day. She states that the stool is mixed  with dark and bright red colored blood. She has mild abdominal pain in lower abdomen. She does not have chest pain, shortness of breath, dizziness, cough, fever, chills. She denies symptoms of UTI, but the nurse noticed that patient has an increased urinary frequency. Patient does not have rashes, unilateral weakness.  This morning patient says that she is hungry and wants to eat food.  Objective: Filed Vitals:   12/01/14 0502  BP: 165/106  Pulse: 109  Temp: 98 F (36.7 C)  Resp: 20    Intake/Output Summary (Last 24 hours) at 12/01/14 1238 Last data filed at 12/01/14 1230  Gross per 24 hour  Intake 2171.25 ml  Output    624 ml  Net 1547.25 ml   Filed Weights   11/30/14 2101  Weight: 66.679 kg (147 lb)    Exam:   General:  Appears in no acute distress  Cardiovascular: S1-S2 is regular  Respiratory: Clear to auscultation bilaterally  Abdomen: Soft, mild tenderness in the left lower quadrant  Musculoskeletal: No edema of the lower extremities, no cyanosis/clubbing  Data Reviewed: Basic Metabolic Panel:  Recent Labs Lab 11/30/14 1611 12/01/14 0800  NA 136 134*  K 3.8 3.8  CL 100* 97*  CO2 28 31  GLUCOSE 121* 115*  BUN 21* 28*  CREATININE 0.73 1.09*  CALCIUM 9.1 9.8   Liver Function Tests:  Recent Labs Lab 11/30/14 1611  AST 25  ALT 17  ALKPHOS 80  BILITOT 0.7  PROT 6.9  ALBUMIN 3.5   No results for input(s): LIPASE, AMYLASE in the last 168 hours. No results for input(s): AMMONIA in the last 168 hours. CBC:  Recent Labs Lab 11/30/14 1611 11/30/14 2240 12/01/14 0514 12/01/14 0951  WBC  9.5 8.7 10.3 9.6  NEUTROABS 6.1  --   --   --   HGB 12.5 11.3* 11.2* 12.3  HCT 38.3 34.9* 33.9* 38.5  MCV 86.5 87.9 91.1 88.7  PLT 430* 437* 248 420*    CBG:  Recent Labs Lab 12/01/14 0731  GLUCAP 77    No results found for this or any previous visit (from the past 240 hour(s)).   Studies: Ct Abdomen Pelvis W Contrast  11/30/2014   CLINICAL DATA:   Pt has been having rectal bleeding for 3 days. Today large amount of bright red blood.  EXAM: CT ABDOMEN AND PELVIS WITH CONTRAST  TECHNIQUE: Multidetector CT imaging of the abdomen and pelvis was performed using the standard protocol following bolus administration of intravenous contrast.  CONTRAST:  115mL OMNIPAQUE IOHEXOL 300 MG/ML  SOLN  COMPARISON:  07/19/14  FINDINGS: Lower chest:  No acute findings  Hepatobiliary: Mild intrahepatic and extrahepatic biliary dilitation, slightly more prominent when compared to the prior study. This may be the result of status post cholecystectomy.A 1cm low attenuation in the right hepatic lobe is present; previously it appeared consistent with a cyst, but does not appear convincingly cystic on the current study. This could be due to differences in scan plane selection given the small size of this finding.  Pancreas: Mild pancreatic ductal dilatation similar to prior study  Spleen: Normal  Adrenals/Urinary Tract: Simple appearing and complex appearing cysts right kidney complex nipple showing mural calcification. Upper pole hyper attenuating left renal lesion measures about 3 cm. It shows average attenuation value. Seventy-eight. It is unchanged. Stable 1.2 cm left adrenal nodule as previously described.  Stomach/Bowel: The distal 9 cm of the sigmoid colon appears abnormal. Shows severe wall thickening with areas of heterogeneous low attenuation within the thickened wall. Proximally there is mild dilatation with large volume of stool in the immediately proximal colon. Diverticulosis in the proximal sigmoid colon. Overall there is a nonobstructive bowel gas pattern.  Vascular/Lymphatic: Moderate to severe aortoiliac calcification  Reproductive: Not seen  Other: No ascites  Musculoskeletal: No acute findings  IMPRESSION: 1. Severe wall thickening and irregularity in the distal half of the sigmoid colon. This could, as has been previously suggested, reflect diverticulitis. However,  malignancy is not excluded.  2. Stable hyperattenuating left renal mass concerning for renal cell carcinoma.  3. Mild biliary and pancreatic duct dilitation, slightly more conspicuous when compared to prior study. No pancreatic head mass identified. This may be related to prior cholecystectomy.  4.  Small nonspecific right hepatic lesion not well characterized.   Electronically Signed   By: Skipper Cliche M.D.   On: 11/30/2014 18:20    Scheduled Meds: . benazepril  40 mg Oral Daily  . bisoprolol  5 mg Oral Daily  . cloNIDine  0.1 mg Transdermal Weekly  . imipramine  30 mg Oral QHS  . levothyroxine  88 mcg Oral QAC breakfast  . metroNIDAZOLE  500 mg Oral TID  . pantoprazole (PROTONIX) IV  40 mg Intravenous Q12H   Continuous Infusions: . sodium chloride 75 mL/hr at 12/01/14 1128    Principal Problem:   GIB (gastrointestinal bleeding) Active Problems:   Hypothyroidism   Anxiety state   Depression   Essential hypertension   Allergic rhinitis   Renal mass, left   Chronic back pain   Atrial fibrillation, persistent with asymptomatic pauses   Warfarin anticoagulation   History of gastric ulcer   COPD (chronic obstructive pulmonary disease)   Peripheral venous insufficiency  Elevated INR    Time spent: 25 min    [facility] Pager [phone]. If 7PM-7AM, please contact night-coverage at [email or web address], password [facility] 12/01/2014, 12:38 PM  LOS: 1 day

## 2014-12-02 DIAGNOSIS — K254 Chronic or unspecified gastric ulcer with hemorrhage: Secondary | ICD-10-CM

## 2014-12-02 DIAGNOSIS — I10 Essential (primary) hypertension: Secondary | ICD-10-CM

## 2014-12-02 LAB — CBC
HCT: 35.7 % — ABNORMAL LOW (ref 36.0–46.0)
HEMATOCRIT: 29.9 % — AB (ref 36.0–46.0)
HEMATOCRIT: 33.8 % — AB (ref 36.0–46.0)
HEMOGLOBIN: 10.9 g/dL — AB (ref 12.0–15.0)
Hemoglobin: 9.9 g/dL — ABNORMAL LOW (ref 12.0–15.0)
Hemoglobin: 11.6 g/dL — ABNORMAL LOW (ref 12.0–15.0)
MCH: 28.9 pg (ref 26.0–34.0)
MCH: 28 pg (ref 26.0–34.0)
MCH: 28.2 pg (ref 26.0–34.0)
MCHC: 33.1 g/dL (ref 30.0–36.0)
MCHC: 32.2 g/dL (ref 30.0–36.0)
MCHC: 32.5 g/dL (ref 30.0–36.0)
MCV: 87.2 fL (ref 78.0–100.0)
MCV: 86.9 fL (ref 78.0–100.0)
MCV: 86.7 fL (ref 78.0–100.0)
PLATELETS: 471 10*3/uL — AB (ref 150–400)
Platelets: 417 10*3/uL — ABNORMAL HIGH (ref 150–400)
Platelets: 428 10*3/uL — ABNORMAL HIGH (ref 150–400)
RBC: 3.43 MIL/uL — AB (ref 3.87–5.11)
RBC: 3.89 MIL/uL (ref 3.87–5.11)
RBC: 4.12 MIL/uL (ref 3.87–5.11)
RDW: 14.6 % (ref 11.5–15.5)
RDW: 14.5 % (ref 11.5–15.5)
RDW: 14.5 % (ref 11.5–15.5)
WBC: 12.8 10*3/uL — AB (ref 4.0–10.5)
WBC: 13 10*3/uL — ABNORMAL HIGH (ref 4.0–10.5)
WBC: 12.3 10*3/uL — ABNORMAL HIGH (ref 4.0–10.5)

## 2014-12-02 LAB — PROTIME-INR
INR: 1.37 (ref 0.00–1.49)
Prothrombin Time: 17 seconds — ABNORMAL HIGH (ref 11.6–15.2)

## 2014-12-02 LAB — OCCULT BLOOD X 1 CARD TO LAB, STOOL: Fecal Occult Bld: POSITIVE — AB

## 2014-12-02 LAB — GLUCOSE, CAPILLARY: Glucose-Capillary: 122 mg/dL — ABNORMAL HIGH (ref 65–99)

## 2014-12-02 MED ORDER — ZOLPIDEM TARTRATE 5 MG PO TABS: 5.0000 mg | ORAL_TABLET | Freq: Every evening | ORAL | Status: DC | PRN

## 2014-12-02 NOTE — Progress Notes (Signed)
Spoke with patient's daughter, Nevin Bloodgood, on the telephone.   She indicates that the patient and the family, as of this time, are in favor of observation rather than investigation with respect to the radiographic abnormality of her sigmoid colon. They might be open to doing a colonoscopy or sigmoidoscopy at a later time, as an outpatient, when the patient feels stronger.  Cleotis Nipper, M.D. Pager [phone] If no answer or after 5 PM call [phone]

## 2014-12-02 NOTE — Progress Notes (Signed)
Utilization Review Completed.Navid Lenzen T9/01/2015  

## 2014-12-02 NOTE — Progress Notes (Signed)
[facility] PROGRESS NOTE  Natalie [name] ATF:[id number] DOB: Aug 06, 1930 DOA: 11/30/2014 PCP: Marton Redwood, MD  Assessment/Plan: 1. Diverticulitis- CT scan shows sigmoid diverticulitis, patient was started on Flagyl as outpatient. Start ceftriaxone 1 g IV daily. 2. GI bleed- patient complained of intermittent bleeding mixed with the stool, likely from diverticulitis. She has a history of diverticular disease. Hemoglobin is stable. Patient's vitals are stable. GI was called last night Dr. Cristina Gong to see the patient today. Continue Protonix. 3. Coagulopathy- patient excluding for A. Fib, came with INR greater than 10. Patient received vitamin K, this morning is 2.46 4. UTI- this morning patient found to have abnormal UA with past nitrite. Will obtain urine culture. Patient has been started on empiric ceftriaxone. 5. Atrial fibrillation- CHA2DS2-VASc Score is 4. Patient on Coumadin. Currently, doesn't hold G I bleed.  6. COPD -stable, continue when necessary albuterol  7. Hypertension- blood pressure is stable continue Lotensin, Zometa, Catapres. Aldactone is on hold 8. Renal mass-  CT scan shows left renal mass concerning for renal cell carcinoma, has seen Urology Dr Williams Che as outpatient as per daughter. 9. Hypothyroidism- continue Synthroid 10. DVT prophylaxis- SCDs  Code Status: Full code Family Communication: Spoke to daughter on phone Disposition Plan: Home when medically stable   Consultants:  GI  Procedures:  None  Antibiotics:  Flagyl  Ceftriaxone  HPI/Subjective: 81 y.o. female with PMH of afib on Coumadin, HTN, diverticulitis, GI bleeding, COPD, chronic back pain, hypothyroidism, depression, sciatica, who presents with the GI bleeding.  Patient reports that she has intermittent GI bleeding in the past 2 years. In the past several days her GIB has been worsening, she has rectal bleeding almost every day, at least once each day. She states that the stool is mixed  with dark and bright red colored blood. She has mild abdominal pain in lower abdomen. She does not have chest pain, shortness of breath, dizziness, cough, fever, chills. She denies symptoms of UTI, but the nurse noticed that patient has an increased urinary frequency. Patient does not have rashes, unilateral weakness.  This morning patient says that she is hungry and wants to eat food.  Objective: Filed Vitals:   12/02/14 1254  BP: 157/91  Pulse: 81  Temp: 97.8 F (36.6 C)  Resp: 18    Intake/Output Summary (Last 24 hours) at 12/02/14 1343 Last data filed at 12/02/14 0900  Gross per 24 hour  Intake   2355 ml  Output    400 ml  Net   1955 ml   Filed Weights   11/30/14 2101  Weight: 66.679 kg (147 lb)    Exam:   General:  Appears in no acute distress  Cardiovascular: S1-S2 is regular  Respiratory: Clear to auscultation bilaterally  Abdomen: Soft, mild tenderness in the left lower quadrant  Musculoskeletal: No edema of the lower extremities, no cyanosis/clubbing  Data Reviewed: Basic Metabolic Panel:  Recent Labs Lab 11/30/14 1611 12/01/14 0800  NA 136 134*  K 3.8 3.8  CL 100* 97*  CO2 28 31  GLUCOSE 121* 115*  BUN 21* 28*  CREATININE 0.73 1.09*  CALCIUM 9.1 9.8   Liver Function Tests:  Recent Labs Lab 11/30/14 1611  AST 25  ALT 17  ALKPHOS 80  BILITOT 0.7  PROT 6.9  ALBUMIN 3.5   No results for input(s): LIPASE, AMYLASE in the last 168 hours. No results for input(s): AMMONIA in the last 168 hours. CBC:  Recent Labs Lab 11/30/14 1611  12/01/14 0951 12/01/14  1442 12/01/14 2014 12/02/14 0235 12/02/14 0847  WBC 9.5  < > 9.6 8.8 15.3* 12.8* 13.0*  NEUTROABS 6.1  --   --   --   --   --   --   HGB 12.5  < > 12.3 10.3* 11.1* 9.9* 10.9*  HCT 38.3  < > 38.5 32.6* 34.7* 29.9* 33.8*  MCV 86.5  < > 88.7 87.6 87.4 87.2 86.9  PLT 430*  < > 420* 392 429* 417* 428*  < > = values in this interval not displayed.  CBG:  Recent Labs Lab  12/01/14 0731 12/02/14 0737  GLUCAP 77 122*    No results found for this or any previous visit (from the past 240 hour(s)).   Studies: Ct Abdomen Pelvis W Contrast  11/30/2014   CLINICAL DATA:  Pt has been having rectal bleeding for 3 days. Today large amount of bright red blood.  EXAM: CT ABDOMEN AND PELVIS WITH CONTRAST  TECHNIQUE: Multidetector CT imaging of the abdomen and pelvis was performed using the standard protocol following bolus administration of intravenous contrast.  CONTRAST:  119mL OMNIPAQUE IOHEXOL 300 MG/ML  SOLN  COMPARISON:  07/19/14  FINDINGS: Lower chest:  No acute findings  Hepatobiliary: Mild intrahepatic and extrahepatic biliary dilitation, slightly more prominent when compared to the prior study. This may be the result of status post cholecystectomy.A 1cm low attenuation in the right hepatic lobe is present; previously it appeared consistent with a cyst, but does not appear convincingly cystic on the current study. This could be due to differences in scan plane selection given the small size of this finding.  Pancreas: Mild pancreatic ductal dilatation similar to prior study  Spleen: Normal  Adrenals/Urinary Tract: Simple appearing and complex appearing cysts right kidney complex nipple showing mural calcification. Upper pole hyper attenuating left renal lesion measures about 3 cm. It shows average attenuation value. Seventy-eight. It is unchanged. Stable 1.2 cm left adrenal nodule as previously described.  Stomach/Bowel: The distal 9 cm of the sigmoid colon appears abnormal. Shows severe wall thickening with areas of heterogeneous low attenuation within the thickened wall. Proximally there is mild dilatation with large volume of stool in the immediately proximal colon. Diverticulosis in the proximal sigmoid colon. Overall there is a nonobstructive bowel gas pattern.  Vascular/Lymphatic: Moderate to severe aortoiliac calcification  Reproductive: Not seen  Other: No ascites   Musculoskeletal: No acute findings  IMPRESSION: 1. Severe wall thickening and irregularity in the distal half of the sigmoid colon. This could, as has been previously suggested, reflect diverticulitis. However, malignancy is not excluded.  2. Stable hyperattenuating left renal mass concerning for renal cell carcinoma.  3. Mild biliary and pancreatic duct dilitation, slightly more conspicuous when compared to prior study. No pancreatic head mass identified. This may be related to prior cholecystectomy.  4.  Small nonspecific right hepatic lesion not well characterized.   Electronically Signed   By: Skipper Cliche M.D.   On: 11/30/2014 18:20    Scheduled Meds: . benazepril  40 mg Oral Daily  . bisoprolol  5 mg Oral Daily  . cefTRIAXone (ROCEPHIN)  IV  2 g Intravenous Q24H  . cloNIDine  0.1 mg Transdermal Weekly  . imipramine  30 mg Oral QHS  . levothyroxine  88 mcg Oral QAC breakfast  . metroNIDAZOLE  500 mg Oral TID  . pantoprazole (PROTONIX) IV  40 mg Intravenous Q12H   Continuous Infusions: . sodium chloride 75 mL/hr at 12/02/14 0101    Principal Problem:  GIB (gastrointestinal bleeding) Active Problems:   Hypothyroidism   Anxiety state   Depression   Essential hypertension   Allergic rhinitis   Renal mass, left   Chronic back pain   Atrial fibrillation, persistent with asymptomatic pauses   Warfarin anticoagulation   History of gastric ulcer   COPD (chronic obstructive pulmonary disease)   Peripheral venous insufficiency   Elevated INR   Diverticulitis of colon   UTI (urinary tract infection)    Time spent: 25 min    [place] [facility] Pager [phone]. If 7PM-7AM, please contact night-coverage at [email or web address], password [facility] 12/02/2014, 1:43 PM  LOS: 2 days            [facility] PROGRESS NOTE  Natalie [name] EZM:[id number] DOB: Jul 17, 1930 DOA: 11/30/2014 PCP: Marton Redwood, MD  Assessment/Plan: 16. Diverticulitis- CT scan shows  sigmoid diverticulitis, patient was started on Flagyl as outpatient. Started ceftriaxone 1 g IV daily. 12. GI bleed- patient complained of intermittent bleeding mixed with the stool, likely from diverticulitis. She has a history of diverticular disease. Hemoglobin is stable. Patient's vitals are stable. GI, Dr. Cristina Gong has seen the patient and recommends no colonoscopy at this time 37. Coagulopathy- patient has a history of A. fib, came with INR greater than 10. Patient received vitamin K, and INR is down to 2.46. 14. UTI- this morning patient found to have abnormal UA with past nitrite. Will obtain urine culture. Patient has been started on empiric ceftriaxone. 15. Atrial fibrillation- CHA2DS2-VASc Score is 4. Patient on Coumadin. Currently, Coumadin is on hold due to G I bleed.  16. COPD -stable, continue when necessary albuterol  17. Hypertension- blood pressure is stable continue Lotensin, Zometa, Catapres. Aldactone is on hold 18. Renal mass-  CT scan shows left renal mass concerning for renal cell carcinoma, has seen Urology Dr Williams Che as outpatient as per daughter. 19. Hypothyroidism- continue Synthroid 20. DVT prophylaxis- SCDs  Code Status: Full code Family Communication: Spoke to daughter on phone Disposition Plan: Home when medically stable   Consultants:  GI  Procedures:  None  Antibiotics:  Flagyl  Ceftriaxone  HPI/Subjective: 81 y.o. female with PMH of afib on Coumadin, HTN, diverticulitis, GI bleeding, COPD, chronic back pain, hypothyroidism, depression, sciatica, who presents with the GI bleeding.  Patient reports that she has intermittent GI bleeding in the past 2 years. In the past several days her GIB has been worsening, she has rectal bleeding almost every day, at least once each day. She states that the stool is mixed with dark and bright red colored blood. She has mild abdominal pain in lower abdomen. She does not have chest pain, shortness of breath, dizziness,  cough, fever, chills. She denies symptoms of UTI, but the nurse noticed that patient has an increased urinary frequency. Patient does not have rashes, unilateral weakness.  This morning patient denies any complaints.  Objective: Filed Vitals:   12/02/14 1254  BP: 157/91  Pulse: 81  Temp: 97.8 F (36.6 C)  Resp: 18    Intake/Output Summary (Last 24 hours) at 12/02/14 1343 Last data filed at 12/02/14 0900  Gross per 24 hour  Intake   2355 ml  Output    400 ml  Net   1955 ml   Filed Weights   11/30/14 2101  Weight: 66.679 kg (147 lb)    Exam:   General:  Appears in no acute distress  Cardiovascular: S1-S2 is regular  Respiratory: Clear to auscultation bilaterally  Abdomen: Soft, mild tenderness  in the left lower quadrant  Musculoskeletal: No edema of the lower extremities, no cyanosis/clubbing  Data Reviewed: Basic Metabolic Panel:  Recent Labs Lab 11/30/14 1611 12/01/14 0800  NA 136 134*  K 3.8 3.8  CL 100* 97*  CO2 28 31  GLUCOSE 121* 115*  BUN 21* 28*  CREATININE 0.73 1.09*  CALCIUM 9.1 9.8   Liver Function Tests:  Recent Labs Lab 11/30/14 1611  AST 25  ALT 17  ALKPHOS 80  BILITOT 0.7  PROT 6.9  ALBUMIN 3.5   No results for input(s): LIPASE, AMYLASE in the last 168 hours. No results for input(s): AMMONIA in the last 168 hours. CBC:  Recent Labs Lab 11/30/14 1611  12/01/14 0951 12/01/14 1442 12/01/14 2014 12/02/14 0235 12/02/14 0847  WBC 9.5  < > 9.6 8.8 15.3* 12.8* 13.0*  NEUTROABS 6.1  --   --   --   --   --   --   HGB 12.5  < > 12.3 10.3* 11.1* 9.9* 10.9*  HCT 38.3  < > 38.5 32.6* 34.7* 29.9* 33.8*  MCV 86.5  < > 88.7 87.6 87.4 87.2 86.9  PLT 430*  < > 420* 392 429* 417* 428*  < > = values in this interval not displayed.  CBG:  Recent Labs Lab 12/01/14 0731 12/02/14 0737  GLUCAP 77 122*    No results found for this or any previous visit (from the past 240 hour(s)).   Studies: Ct Abdomen Pelvis W Contrast  11/30/2014    CLINICAL DATA:  Pt has been having rectal bleeding for 3 days. Today large amount of bright red blood.  EXAM: CT ABDOMEN AND PELVIS WITH CONTRAST  TECHNIQUE: Multidetector CT imaging of the abdomen and pelvis was performed using the standard protocol following bolus administration of intravenous contrast.  CONTRAST:  172mL OMNIPAQUE IOHEXOL 300 MG/ML  SOLN  COMPARISON:  07/19/14  FINDINGS: Lower chest:  No acute findings  Hepatobiliary: Mild intrahepatic and extrahepatic biliary dilitation, slightly more prominent when compared to the prior study. This may be the result of status post cholecystectomy.A 1cm low attenuation in the right hepatic lobe is present; previously it appeared consistent with a cyst, but does not appear convincingly cystic on the current study. This could be due to differences in scan plane selection given the small size of this finding.  Pancreas: Mild pancreatic ductal dilatation similar to prior study  Spleen: Normal  Adrenals/Urinary Tract: Simple appearing and complex appearing cysts right kidney complex nipple showing mural calcification. Upper pole hyper attenuating left renal lesion measures about 3 cm. It shows average attenuation value. Seventy-eight. It is unchanged. Stable 1.2 cm left adrenal nodule as previously described.  Stomach/Bowel: The distal 9 cm of the sigmoid colon appears abnormal. Shows severe wall thickening with areas of heterogeneous low attenuation within the thickened wall. Proximally there is mild dilatation with large volume of stool in the immediately proximal colon. Diverticulosis in the proximal sigmoid colon. Overall there is a nonobstructive bowel gas pattern.  Vascular/Lymphatic: Moderate to severe aortoiliac calcification  Reproductive: Not seen  Other: No ascites  Musculoskeletal: No acute findings  IMPRESSION: 1. Severe wall thickening and irregularity in the distal half of the sigmoid colon. This could, as has been previously suggested, reflect  diverticulitis. However, malignancy is not excluded.  2. Stable hyperattenuating left renal mass concerning for renal cell carcinoma.  3. Mild biliary and pancreatic duct dilitation, slightly more conspicuous when compared to prior study. No pancreatic head mass identified. This  may be related to prior cholecystectomy.  4.  Small nonspecific right hepatic lesion not well characterized.   Electronically Signed   By: Skipper Cliche M.D.   On: 11/30/2014 18:20    Scheduled Meds: . benazepril  40 mg Oral Daily  . bisoprolol  5 mg Oral Daily  . cefTRIAXone (ROCEPHIN)  IV  2 g Intravenous Q24H  . cloNIDine  0.1 mg Transdermal Weekly  . imipramine  30 mg Oral QHS  . levothyroxine  88 mcg Oral QAC breakfast  . metroNIDAZOLE  500 mg Oral TID  . pantoprazole (PROTONIX) IV  40 mg Intravenous Q12H   Continuous Infusions: . sodium chloride 75 mL/hr at 12/02/14 0101    Principal Problem:   GIB (gastrointestinal bleeding) Active Problems:   Hypothyroidism   Anxiety state   Depression   Essential hypertension   Allergic rhinitis   Renal mass, left   Chronic back pain   Atrial fibrillation, persistent with asymptomatic pauses   Warfarin anticoagulation   History of gastric ulcer   COPD (chronic obstructive pulmonary disease)   Peripheral venous insufficiency   Elevated INR   Diverticulitis of colon   UTI (urinary tract infection)    Time spent: 25 min    [place] [facility] Pager [phone]. If 7PM-7AM, please contact night-coverage at [email or web address], password [facility] 12/02/2014, 1:43 PM  LOS: 2 days

## 2014-12-02 NOTE — Progress Notes (Signed)
Patient seen and examined.  She is anxious to get home.  Currently on antibiotic treatment for probable sigmoid diverticulitis.  Abdomen is mildly, but reproducibly, tender in the left lower quadrant. Overall, however, it's a benign abdomen and a fairly unimpressive exam.  Her white count remains elevated at 12,300, but she is afebrile.  Recommendation:  1. Would consider transition to oral antibiotics, and discharge home, once  the white count has normalized, or in 2-3 days, which ever comes first.  2. As mentioned in my note from earlier today, I spoke with the patient's daughter and there is a mutual decision not to do a sigmoidoscopy at this time for further investigation of her sigmoid "mass." I feel that this is reasonable and in fact, if she truly does have acute diverticulitis, it is probably safer if we do not instrument the colon at this time.  3. The patient's rectal bleeding occurred in the setting of extreme over anticoagulation. If she remains heme positive as an outpatient with a therapeutic INR, that would be a fairly strong reason to consider sigmoidoscopic evaluation, ideally a minimum of several weeks after completion of treatment for diverticulitis. For this reason, I would advocate follow-up with her primary physician following discharge, to monitor Hemoccults and hemoglobin status.  I will sign off at this time. However, please call if I can be of further assistance in this patient's care.   Cleotis Nipper, M.D. Pager [phone] If no answer or after 5 PM call [phone]

## 2014-12-03 LAB — PROTIME-INR
INR: 1.52 — AB (ref 0.00–1.49)
Prothrombin Time: 18.3 seconds — ABNORMAL HIGH (ref 11.6–15.2)

## 2014-12-03 LAB — GLUCOSE, CAPILLARY: Glucose-Capillary: 111 mg/dL — ABNORMAL HIGH (ref 65–99)

## 2014-12-03 LAB — OCCULT BLOOD X 1 CARD TO LAB, STOOL
FECAL OCCULT BLD: POSITIVE — AB
Fecal Occult Bld: POSITIVE — AB

## 2014-12-03 MED ORDER — SPIRONOLACTONE 25 MG PO TABS: 25.0000 mg | ORAL_TABLET | Freq: Two times a day (BID) | ORAL | Status: DC

## 2014-12-03 MED ORDER — AMOXICILLIN-POT CLAVULANATE 875-125 MG PO TABS
1.0000 | ORAL_TABLET | Freq: Two times a day (BID) | ORAL | Status: DC
  Administered 2014-12-03 – 2014-12-04 (×3): 1 via ORAL
  Filled 2014-12-03 (×3): qty 1

## 2014-12-03 MED ORDER — WARFARIN SODIUM 2.5 MG PO TABS
2.5000 mg | ORAL_TABLET | Freq: Once | ORAL | Status: AC
  Administered 2014-12-03: 2.5 mg via ORAL
  Filled 2014-12-03: qty 1

## 2014-12-03 MED ORDER — WARFARIN - PHARMACIST DOSING INPATIENT: Freq: Every day | Status: DC

## 2014-12-03 MED ORDER — AMOXICILLIN-POT CLAVULANATE 875-125 MG PO TABS: 1.0000 | ORAL_TABLET | Freq: Two times a day (BID) | ORAL | Status: DC

## 2014-12-03 MED ORDER — SPIRONOLACTONE 25 MG PO TABS
25.0000 mg | ORAL_TABLET | Freq: Two times a day (BID) | ORAL | Status: DC
  Administered 2014-12-03 – 2014-12-04 (×2): 25 mg via ORAL
  Filled 2014-12-03 (×3): qty 1

## 2014-12-03 NOTE — Progress Notes (Signed)
[facility] PROGRESS NOTE  Natalie [name] OIN:[id number] DOB: [birth year] DOA: 11/30/2014 PCP: Marton Redwood, MD  Assessment/Plan: 1. Diverticulitis- CT scan shows sigmoid diverticulitis, patient was started on Flagyl as outpatient. Started ceftriaxone 1 g IV daily. Will discontinue ceftriaxone and Flagyl at this time. Start Augmentin 1 tablet by mouth twice a day to avoid drug interaction with Coumadin as INR was up with use of Flagyl at home. 2. GI bleed- patient complained of intermittent bleeding mixed with the stool, likely from diverticulitis. She has a history of diverticular disease. Hemoglobin is stable. Patient's vitals are stable. Patient was seen by GI Dr. Cristina Gong, and he recommends no intervention at this time and follow-up with PCP to check the Hemoccults and hemoglobin as outpatient. Patient might benefit from sigmoidoscopy if she continues to have guaiac-positive stools with therapeutic INR. 3. Coagulopathy- patient excluding for A. Fib, came with INR greater than 10. Patient received vitamin K, this morning is 1.52. Will restart Coumadin. 4. UTI- this morning patient found to have abnormal UA with past nitrite. Will obtain urine culture. Patient has urine culture growing enterococcus greater than 100,000 colonies, will start Augmentin empirically. Await final sensitivity report. 5. Atrial fibrillation- CHA2DS2-VASc Score is 4. Patient on Coumadin, which was held in the hospital. At this time will restart Coumadin per pharmacy consultation. 6. COPD -stable, continue when necessary albuterol  7. Hypertension- blood pressure is stable continue Lotensin, Zebeta, Catapres. Will restart Aldactone 25 mg by mouth twice a day 8. Renal mass-  CT scan shows left renal mass concerning for renal cell carcinoma, has seen Urology Dr Williams Che as outpatient as per daughter. 9. Hypothyroidism- continue Synthroid 10. DVT prophylaxis- SCDs  Code Status: Full code Family Communication: Spoke to  daughter on phone Disposition Plan: Home when medically stable, likely home in a.m. once final report of urine culture is back   Consultants:  GI  Procedures:  None  Antibiotics:  Flagyl  Ceftriaxone  HPI/Subjective: 81 y.o. female with PMH of afib on Coumadin, HTN, diverticulitis, GI bleeding, COPD, chronic back pain, hypothyroidism, depression, sciatica, who presents with the GI bleeding.  Patient reports that she has intermittent GI bleeding in the past 2 years. In the past several days her GIB has been worsening, she has rectal bleeding almost every day, at least once each day. She states that the stool is mixed with dark and bright red colored blood. She has mild abdominal pain in lower abdomen. She does not have chest pain, shortness of breath, dizziness, cough, fever, chills. She denies symptoms of UTI, but the nurse noticed that patient has an increased urinary frequency. Patient does not have rashes, unilateral weakness.  This morning patient denies any complaints.  Objective: Filed Vitals:   12/03/14 0548  BP: 172/106  Pulse: 93  Temp: 98.2 F (36.8 C)  Resp: 20    Intake/Output Summary (Last 24 hours) at 12/03/14 1313 Last data filed at 12/03/14 0600  Gross per 24 hour  Intake   2280 ml  Output    351 ml  Net   1929 ml   Filed Weights   11/30/14 2101  Weight: 66.679 kg (147 lb)    Exam:   General:  Appears in no acute distress  Cardiovascular: S1-S2 is regular  Respiratory: Clear to auscultation bilaterally  Abdomen: Soft, mild tenderness in the left lower quadrant  Musculoskeletal: No edema of the lower extremities, no cyanosis/clubbing  Data Reviewed: Basic Metabolic Panel:  Recent Labs Lab 11/30/14 1611 12/01/14 0800  NA 136 134*  K 3.8 3.8  CL 100* 97*  CO2 28 31  GLUCOSE 121* 115*  BUN 21* 28*  CREATININE 0.73 1.09*  CALCIUM 9.1 9.8   Liver Function Tests:  Recent Labs Lab 11/30/14 1611  AST 25  ALT 17  ALKPHOS 80   BILITOT 0.7  PROT 6.9  ALBUMIN 3.5   No results for input(s): LIPASE, AMYLASE in the last 168 hours. No results for input(s): AMMONIA in the last 168 hours. CBC:  Recent Labs Lab 11/30/14 1611  12/01/14 1442 12/01/14 2014 12/02/14 0235 12/02/14 0847 12/02/14 1445  WBC 9.5  < > 8.8 15.3* 12.8* 13.0* 12.3*  NEUTROABS 6.1  --   --   --   --   --   --   HGB 12.5  < > 10.3* 11.1* 9.9* 10.9* 11.6*  HCT 38.3  < > 32.6* 34.7* 29.9* 33.8* 35.7*  MCV 86.5  < > 87.6 87.4 87.2 86.9 86.7  PLT 430*  < > 392 429* 417* 428* 471*  < > = values in this interval not displayed.  CBG:  Recent Labs Lab 12/01/14 0731 12/02/14 0737 12/03/14 0851  GLUCAP 77 122* 111*    Recent Results (from the past 240 hour(s))  Urine culture     Status: None (Preliminary result)   Collection Time: 12/01/14  6:50 PM  Result Value Ref Range Status   Specimen Description URINE, CLEAN CATCH  Final   Special Requests NONE  Final   Culture   Final    >=100,000 COLONIES/mL ENTEROCOCCUS SPECIES Performed at [facility]    Report Status PENDING  Incomplete     Studies: No results found.  Scheduled Meds: . amoxicillin-clavulanate  1 tablet Oral Q12H  . benazepril  40 mg Oral Daily  . bisoprolol  5 mg Oral Daily  . cloNIDine  0.1 mg Transdermal Weekly  . imipramine  30 mg Oral QHS  . levothyroxine  88 mcg Oral QAC breakfast  . pantoprazole (PROTONIX) IV  40 mg Intravenous Q12H  . spironolactone  25 mg Oral BID   Continuous Infusions: . sodium chloride 75 mL/hr at 12/03/14 0245    Principal Problem:   GIB (gastrointestinal bleeding) Active Problems:   Hypothyroidism   Anxiety state   Depression   Essential hypertension   Allergic rhinitis   Renal mass, left   Chronic back pain   Atrial fibrillation, persistent with asymptomatic pauses   Warfarin anticoagulation   History of gastric ulcer   COPD (chronic obstructive pulmonary disease)   Peripheral venous insufficiency   Elevated  INR   Diverticulitis of colon   UTI (urinary tract infection)    Time spent: 25 min    [facility] Pager [phone]. If 7PM-7AM, please contact night-coverage at [email or web address], password [facility] 12/03/2014, 1:13 PM  LOS: 3 days            [facility] PROGRESS NOTE  Natalie [name] DTO:[id number] DOB: [birth year] DOA: 11/30/2014 PCP: Marton Redwood, MD  Assessment/Plan: 47. Diverticulitis- CT scan shows sigmoid diverticulitis, patient was started on Flagyl as outpatient. Started ceftriaxone 1 g IV daily. 12. GI bleed- patient complained of intermittent bleeding mixed with the stool, likely from diverticulitis. She has a history of diverticular disease. Hemoglobin is stable. Patient's vitals are stable. GI, Dr. Cristina Gong has seen the patient and recommends no colonoscopy at this time 29. Coagulopathy- patient has a history of A. fib, came with INR greater than 10. Patient received  vitamin K, and INR is down to 2.46. 14. UTI- this morning patient found to have abnormal UA with past nitrite. Will obtain urine culture. Patient has been started on empiric ceftriaxone. 15. Atrial fibrillation- CHA2DS2-VASc Score is 4. Patient on Coumadin. Currently, Coumadin is on hold due to G I bleed.  16. COPD -stable, continue when necessary albuterol  17. Hypertension- blood pressure is stable continue Lotensin, Zometa, Catapres. Aldactone is on hold 18. Renal mass-  CT scan shows left renal mass concerning for renal cell carcinoma, has seen Urology Dr Williams Che as outpatient as per daughter. 19. Hypothyroidism- continue Synthroid 20. DVT prophylaxis- SCDs  Code Status: Full code Family Communication: Spoke to daughter on phone Disposition Plan: Home when medically stable   Consultants:  GI  Procedures:  None  Antibiotics:  Flagyl  Ceftriaxone  HPI/Subjective: 81 y.o. female with PMH of afib on Coumadin, HTN, diverticulitis, GI bleeding, COPD, chronic back pain,  hypothyroidism, depression, sciatica, who presents with the GI bleeding.  Patient reports that she has intermittent GI bleeding in the past 2 years. In the past several days her GIB has been worsening, she has rectal bleeding almost every day, at least once each day. She states that the stool is mixed with dark and bright red colored blood. She has mild abdominal pain in lower abdomen. She does not have chest pain, shortness of breath, dizziness, cough, fever, chills. She denies symptoms of UTI, but the nurse noticed that patient has an increased urinary frequency. Patient does not have rashes, unilateral weakness.  This morning patient denies any complaints.  Objective: Filed Vitals:   12/03/14 0548  BP: 172/106  Pulse: 93  Temp: 98.2 F (36.8 C)  Resp: 20    Intake/Output Summary (Last 24 hours) at 12/03/14 1313 Last data filed at 12/03/14 0600  Gross per 24 hour  Intake   2280 ml  Output    351 ml  Net   1929 ml   Filed Weights   11/30/14 2101  Weight: 66.679 kg (147 lb)    Exam:   General:  Appears in no acute distress  Cardiovascular: S1-S2 is regular  Respiratory: Clear to auscultation bilaterally  Abdomen: Soft, mild tenderness in the left lower quadrant  Musculoskeletal: No edema of the lower extremities, no cyanosis/clubbing  Data Reviewed: Basic Metabolic Panel:  Recent Labs Lab 11/30/14 1611 12/01/14 0800  NA 136 134*  K 3.8 3.8  CL 100* 97*  CO2 28 31  GLUCOSE 121* 115*  BUN 21* 28*  CREATININE 0.73 1.09*  CALCIUM 9.1 9.8   Liver Function Tests:  Recent Labs Lab 11/30/14 1611  AST 25  ALT 17  ALKPHOS 80  BILITOT 0.7  PROT 6.9  ALBUMIN 3.5   No results for input(s): LIPASE, AMYLASE in the last 168 hours. No results for input(s): AMMONIA in the last 168 hours. CBC:  Recent Labs Lab 11/30/14 1611  12/01/14 1442 12/01/14 2014 12/02/14 0235 12/02/14 0847 12/02/14 1445  WBC 9.5  < > 8.8 15.3* 12.8* 13.0* 12.3*  NEUTROABS 6.1  --    --   --   --   --   --   HGB 12.5  < > 10.3* 11.1* 9.9* 10.9* 11.6*  HCT 38.3  < > 32.6* 34.7* 29.9* 33.8* 35.7*  MCV 86.5  < > 87.6 87.4 87.2 86.9 86.7  PLT 430*  < > 392 429* 417* 428* 471*  < > = values in this interval not displayed.  CBG:  Recent  Labs Lab 12/01/14 0731 12/02/14 0737 12/03/14 0851  GLUCAP 77 122* 111*    Recent Results (from the past 240 hour(s))  Urine culture     Status: None (Preliminary result)   Collection Time: 12/01/14  6:50 PM  Result Value Ref Range Status   Specimen Description URINE, CLEAN CATCH  Final   Special Requests NONE  Final   Culture   Final    >=100,000 COLONIES/mL ENTEROCOCCUS SPECIES Performed at [facility]    Report Status PENDING  Incomplete     Studies: No results found.  Scheduled Meds: . amoxicillin-clavulanate  1 tablet Oral Q12H  . benazepril  40 mg Oral Daily  . bisoprolol  5 mg Oral Daily  . cloNIDine  0.1 mg Transdermal Weekly  . imipramine  30 mg Oral QHS  . levothyroxine  88 mcg Oral QAC breakfast  . pantoprazole (PROTONIX) IV  40 mg Intravenous Q12H  . spironolactone  25 mg Oral BID   Continuous Infusions: . sodium chloride 75 mL/hr at 12/03/14 0245    Principal Problem:   GIB (gastrointestinal bleeding) Active Problems:   Hypothyroidism   Anxiety state   Depression   Essential hypertension   Allergic rhinitis   Renal mass, left   Chronic back pain   Atrial fibrillation, persistent with asymptomatic pauses   Warfarin anticoagulation   History of gastric ulcer   COPD (chronic obstructive pulmonary disease)   Peripheral venous insufficiency   Elevated INR   Diverticulitis of colon   UTI (urinary tract infection)    Time spent: 25 min    [facility] Pager [phone]. If 7PM-7AM, please contact night-coverage at [email or web address], password [facility] 12/03/2014, 1:13 PM  LOS: 3 days

## 2014-12-03 NOTE — Care Management Note (Signed)
Case Management Note  Patient Details  Name: Natalie [name] MRN: [id number] Date of Birth: 04-04-1930  Subjective/Objective:81 y/o f admitted w/GIB. PT-HHPT. [facility] chosen, [facility] rep aware of HHPT order,& d/c today.                    Action/Plan:d/c home w/HHC.   Expected Discharge Date:                  Expected Discharge Plan:  [place]  In-House Referral:     Discharge planning Services  CM Consult  Post Acute Care Choice:    Choice offered to:  Patient  DME Arranged:    DME Agency:     HH Arranged:  PT [place]:  [place]  Status of Service:  Completed, signed off  Medicare Important Message Given:  Yes-second notification given Date Medicare IM Given:    Medicare IM give by:    Date Additional Medicare IM Given:    Additional Medicare Important Message give by:     If discussed at [place] of Stay Meetings, dates discussed:    Additional Comments:  Dessa Phi, RN 12/03/2014, 12:32 PM

## 2014-12-03 NOTE — Evaluation (Signed)
Occupational Therapy Evaluation Patient Details Name: Natalie [name] MRN: [id number] DOB: 1930-11-08 Today's Date: 12/03/2014    History of Present Illness 81 yo female admitted with GI bleed.    Clinical Impression   Pt admitted with GI bleed Pt currently with functional limitations due to the deficits listed below (see OT Problem List). Pt will benefit from skilled OT to increase their safety and independence with ADL and functional mobility for ADL to facilitate discharge to venue listed below.      Follow Up Recommendations  SNF;Home health OT;Supervision/Assistance - 24 hour    Equipment Recommendations  None recommended by OT       Precautions / Restrictions Restrictions Weight Bearing Restrictions: No      Mobility Bed Mobility Overal bed mobility: Needs Assistance Bed Mobility: Sit to Supine;Supine to Sit     Supine to sit: Mod assist Sit to supine: Mod assist   General bed mobility comments: increased time  Transfers Overall transfer level: Needs assistance Equipment used: Rolling walker (2 wheeled) Transfers: Sit to/from Stand Sit to Stand: Mod assist                   ADL Overall ADL's : Needs assistance/impaired Eating/Feeding: Set up;Sitting   Grooming: Minimal assistance;Sitting                   Toilet Transfer: Moderate assistance;BSC;+2 for physical assistance;+2 for safety/equipment   Toileting- Clothing Manipulation and Hygiene: Maximal assistance;Sit to/from stand;+2 for physical assistance;+2 for safety/equipment                         Pertinent Vitals/Pain Pain Assessment: No/denies pain     Hand Dominance     Extremity/Trunk Assessment Upper Extremity Assessment Upper Extremity Assessment: Generalized weakness           Communication Communication Communication: HOH   Cognition Arousal/Alertness: Awake/alert Behavior During Therapy: WFL for tasks assessed/performed Overall Cognitive Status: No  family/caregiver present to determine baseline cognitive functioning Area of Impairment: Orientation                   General Comments   pt will need significant A at home. Will need to call daughter            Home Living Family/patient expects to be discharged to:: Private residence Living Arrangements: Children   Type of Home: House Home Access: Stairs to enter Technical brewer of Steps: 3 Entrance Stairs-Rails: Right Home Layout: Two level Alternate Level Stairs-Number of Steps: 1 flight Alternate Level Stairs-Rails: Right;Left           Home Equipment: Walker - 2 wheels;Cane - single point          Prior Functioning/Environment Level of Independence: Independent with assistive device(s)        Comments: uses walker and is able to perform bathing/dressing    OT Diagnosis: Generalized weakness   OT Problem List: Decreased strength;Decreased activity tolerance;Decreased safety awareness;Decreased knowledge of precautions;Impaired balance (sitting and/or standing);Decreased knowledge of use of DME or AE   OT Treatment/Interventions: Self-care/ADL training;DME and/or AE instruction;Patient/family education    OT Goals(Current goals can be found in the care plan section) Acute Rehab OT Goals Patient Stated Goal: get out of here and go home OT Goal Formulation: With patient Time For Goal Achievement: 12/17/14 Potential to Achieve Goals: Good  OT Frequency: Min 2X/week  End of Session Nurse Communication: Mobility status  Activity Tolerance: Patient tolerated treatment well Patient left: in bed   Time: 4163-8453 OT Time Calculation (min): 24 min Charges:  OT General Charges $OT Visit: 1 Procedure OT Evaluation $Initial OT Evaluation Tier I: 1 Procedure OT Treatments $Self Care/Home Management : 8-22 mins G-Codes:    Payton Mccallum D Dec 10, 2014, 10:49 AM

## 2014-12-03 NOTE — Care Management Important Message (Signed)
Important Message  Patient Details  Name: Natalie [name] MRN: [id number] Date of Birth: 1931-02-22   Medicare Important Message Given:  Yes-second notification given    Camillo Flaming 12/03/2014, 12:17 [place] Message  Patient Details  Name: Natalie [name] MRN: [id number] Date of Birth: Mar 07, 1931   Medicare Important Message Given:  Yes-second notification given    Camillo Flaming 12/03/2014, 12:17 PM

## 2014-12-03 NOTE — Care Management Note (Signed)
Case Management Note  Patient Details  Name: Natalie [name] MRN: [id number] Date of Birth: [birth year]  Subjective/Objective:                    Action/Plan:   Expected Discharge Date:                  Expected Discharge Plan:  [place]  In-House Referral:     Discharge planning Services  CM Consult  Post Acute Care Choice:    Choice offered to:  Patient  DME Arranged:    DME Agency:     HH Arranged:  PT, OT [place] Agency:  [place]  Status of Service:  Completed, signed off  Medicare Important Message Given:  Yes-second notification given Date Medicare IM Given:    Medicare IM give by:    Date Additional Medicare IM Given:    Additional Medicare Important Message give by:     If discussed at [place] of Stay Meetings, dates discussed:    Additional Comments:  Dessa Phi, RN 12/03/2014, 12:33 PM

## 2014-12-03 NOTE — Progress Notes (Signed)
Physical Therapy Treatment Patient Details Name: Natalie [name] MRN: [id number] DOB: 1930/11/22 Today's Date: 12/03/2014    History of Present Illness 81 yo female admitted with GI bleed.     PT Comments    Pt only able to tolerate limited distance ambulation.  Pt requested use of BSC.  Pt unable to assist with pericare however reports once home "I can do it"  Pt states daughter can assist upon d/c (no family present).  Pt will need at least min assist at this time for steadying and likely assist for ADLs upon d/c.  If daughter unable to assist, pt may need SNF.   Follow Up Recommendations  Home health PT;Supervision/Assistance - 24 hour     Equipment Recommendations  None recommended by PT    Recommendations for Other Services       Precautions / Restrictions Precautions Precautions: Fall Restrictions Weight Bearing Restrictions: No    Mobility  Bed Mobility Overal bed mobility: Needs Assistance Bed Mobility: Supine to Sit;Sit to Supine     Supine to sit: Min guard;HOB elevated Sit to supine: Mod assist   General bed mobility comments: increased time and effort to get to EOB, assist for LEs onto bed  Transfers Overall transfer level: Needs assistance Equipment used: Rolling walker (2 wheeled) Transfers: Sit to/from Stand Sit to Stand: Min assist         General transfer comment: multimodal cues for safe technique, assist to rise and stabilize due to posterior lean  Ambulation/Gait Ambulation/Gait assistance: Min assist Ambulation Distance (Feet): 20 Feet Assistive device: Rolling walker (2 wheeled) Gait Pattern/deviations: Step-through pattern;Trunk flexed;Narrow base of support;Decreased stride length     General Gait Details: assist to steady, slow pace, pt reported urinary urgency so distance limited to use BSC which then pt became fatigued and wished to return to bed   Stairs            Wheelchair Mobility    Modified Rankin (Stroke  Patients Only)       Balance Overall balance assessment: Needs assistance         Standing balance support: Bilateral upper extremity supported;During functional activity Standing balance-Leahy Scale: Poor                      Cognition Arousal/Alertness: Awake/alert Behavior During Therapy: WFL for tasks assessed/performed Overall Cognitive Status: No family/caregiver present to determine baseline cognitive functioning Area of Impairment: Orientation                    Exercises      General Comments        Pertinent Vitals/Pain Pain Assessment: No/denies pain    Home Living Family/patient expects to be discharged to:: Private residence Living Arrangements: Children   Type of Home: House Home Access: Stairs to enter Entrance Stairs-Rails: Right Home Layout: Two level Home Equipment: Environmental consultant - 2 wheels;Cane - single point      Prior Function Level of Independence: Independent with assistive device(s)      Comments: uses walker and is able to perform bathing/dressing   PT Goals (current goals can now be found in the care plan section) Acute Rehab PT Goals Patient Stated Goal: get out of here and go home Progress towards PT goals: Progressing toward goals    Frequency  Min 3X/week    PT Plan Current plan remains appropriate    Co-evaluation             End  of Session Equipment Utilized During Treatment: Gait belt Activity Tolerance: Patient limited by fatigue Patient left: in bed;with call bell/phone within reach;with bed alarm set     Time: 0946-1010 PT Time Calculation (min) (ACUTE ONLY): 24 min  Charges:  $Gait Training: 8-22 mins $Therapeutic Activity: 8-22 mins                    G Codes:      Darriana Deboy,KATHrine E 2014-12-24, 11:49 AM Carmelia Bake, PT, DPT 12-24-14 Pager: [phone]

## 2014-12-03 NOTE — Progress Notes (Signed)
ANTICOAGULATION CONSULT NOTE - Initial Consult  Pharmacy Consult for Warfarin Indication: atrial fibrillation  Allergies  Allergen Reactions  . Levofloxacin Other (See Comments)    REACTION: unspecified per [facility]    Patient Measurements: Height: 5\' 2"  (157.5 cm) Weight: 147 lb (66.679 kg) IBW/kg (Calculated) : 50.1  Vital Signs: Temp: 98.2 F (36.8 C) (09/12 0548) Temp Source: Oral (09/12 0548) BP: 172/106 mmHg (09/12 0548) Pulse Rate: 93 (09/12 0548)  Labs:  Recent Labs  11/30/14 1611 11/30/14 2240  12/01/14 0709 12/01/14 0800  12/02/14 0235 12/02/14 0847 12/02/14 1445 12/03/14 0441  HGB 12.5 11.3*  < >  --   --   < > 9.9* 10.9* 11.6*  --   HCT 38.3 34.9*  < >  --   --   < > 29.9* 33.8* 35.7*  --   PLT 430* 437*  < >  --   --   < > 417* 428* 471*  --   APTT  --  53*  --   --   --   --   --   --   --   --   LABPROT 88.6*  --   --  26.3*  --   --  17.0*  --   --  18.3*  INR >10.00*  --   --  2.46*  --   --  1.37  --   --  1.52*  CREATININE 0.73  --   --   --  1.09*  --   --   --   --   --   < > = values in this interval not displayed.  Estimated Creatinine Clearance: 35.6 mL/min (by C-G formula based on Cr of 1.09).   Medical History: Past Medical History  Diagnosis Date  . Osteoporosis   . Hypertension   . Hypothyroidism   . Lower GI bleeding 07/21/11  . Atrial fibrillation   . COPD (chronic obstructive pulmonary disease)   . Pneumonia   . Shortness of breath 07/21/11    "anytime"  . Sinus headache 07/21/11    "all the time"  . Bladder infection, chronic   . Arthritis     "everywhere"  . Anxiety   . Chronic lower back pain   . Chronic abdominal pain   . Renal mass, left 2013  . PUD (peptic ulcer disease)   . Chronic cystitis   . Trigeminal neuralgia     /E-chart  . Chronic kidney disease   . Anemia     Medications:  Prescriptions prior to admission  Medication Sig Dispense Refill Last Dose  . ALPRAZolam (XANAX) 0.25 MG tablet Take 0.25 mg by  mouth every 8 (eight) hours as needed for anxiety.    11/29/2014 at Unknown time  . benazepril (LOTENSIN) 40 MG tablet TAKE 1 TABLET BY MOUTH EVERY DAY *MUST KEEP 04-05-14 APPOINTMENT* (Patient taking differently: Take 40 mg by mouth daily. TAKE 1 TABLET BY MOUTH EVERY DAY *MUST KEEP 04-05-14 APPOINTMENT*) 30 tablet 10 11/30/2014 at Unknown time  . bisoprolol (ZEBETA) 5 MG tablet Take 5 mg by mouth daily.   11/30/2014 at 1400  . cloNIDine (CATAPRES - DOSED IN MG/24 HR) 0.1 mg/24hr patch Place 1 patch onto the skin once a week.  2 11/28/2014 at Unknown time  . dicyclomine (BENTYL) 10 MG capsule Take 10 mg by mouth 4 (four) times daily as needed for spasms.    Past Week at Unknown time  . imipramine (TOFRANIL) 10 MG tablet Take  30 mg by mouth at bedtime.   11/29/2014 at Unknown time  . levothyroxine (SYNTHROID, LEVOTHROID) 88 MCG tablet Take 88 mcg by mouth daily before breakfast.   11/30/2014 at 0600  . loperamide (IMODIUM) 2 MG capsule Take 2 mg by mouth as needed for diarrhea or loose stools.   Past Week at Unknown time  . metroNIDAZOLE (FLAGYL) 500 MG tablet Take 1 tablet (500 mg total) by mouth 2 (two) times daily. (Patient taking differently: Take 500 mg by mouth 3 (three) times daily. ) 20 tablet 0 11/30/2014 at Unknown time  . traMADol (ULTRAM) 50 MG tablet Take 50 mg by mouth every 8 (eight) hours as needed. for pain  1 11/29/2014 at 2015  . warfarin (COUMADIN) 2.5 MG tablet Take 1 to 1.5 tablets by mouth daily as directed by coumadin clinic.  Needs INR for further refills (Patient taking differently: Take 2.5 mg by mouth daily at 6 PM. ) 35 tablet 0 11/29/2014 at 1800  . [DISCONTINUED] spironolactone (ALDACTONE) 25 MG tablet Take 25 mg by mouth 2 (two) times daily.    11/30/2014 at Unknown time  . ciprofloxacin (CIPRO) 500 MG tablet Take 1 tablet (500 mg total) by mouth every 12 (twelve) hours. (Patient not taking: Reported on 11/30/2014) 20 tablet 0 Not Taking at Unknown time   Scheduled:  . amoxicillin-clavulanate   1 tablet Oral Q12H  . benazepril  40 mg Oral Daily  . bisoprolol  5 mg Oral Daily  . cloNIDine  0.1 mg Transdermal Weekly  . imipramine  30 mg Oral QHS  . levothyroxine  88 mcg Oral QAC breakfast  . pantoprazole (PROTONIX) IV  40 mg Intravenous Q12H  . spironolactone  25 mg Oral BID  . warfarin  2.5 mg Oral Once  . Warfarin - Pharmacist Dosing Inpatient   Does not apply q1800    Assessment: 81 y.o. female with PMH of afib on Coumadin, HTN, diverticulitis, GI bleeding, COPD, chronic back pain, hypothyroidism, depression, sciatica, who presents with GIB.  Noted INR > 10 (likely due to Flagyl outpatient)   Baseline INR, aPTT: high on admit  Prior anticoagulation: Warfarin 2.5 mg daily  Significant events:  Today, 12/03/2014:  CBC: Hgb low but stable; Plt wnl  INR subtherapeutic as expected (last dose warfarin 9/7 or 9/8)  Major drug interactions: Flagyl stopped, now on Augmentin  Remains FOBT+ this AM - Spoke with GI; feels patient can restart warfarin with close f/u since did not have significant bleeding with much higher INR and could be residual stool from earlier this admission.  Eating 75% of meals  Goal of Therapy: INR 2-3  Plan:  Warfarin 2.5 mg PO tonight at 16:00. May consider lower dose tomorrow given recent antibiotics and acute illness  Daily INR  CBC at least q72 hr while on warfarin  Monitor for signs of bleeding or thrombosis   Reuel Boom, PharmD Pager: [phone] 12/03/2014, 3:37 PM

## 2014-12-03 NOTE — Discharge Summary (Signed)
Physician Discharge Summary  Natalie [name] JYN:[id number] DOB: 1930-05-04 DOA: 11/30/2014  PCP: Marton Redwood, MD  Admit date: 11/30/2014 Discharge date: 12/03/2014  Time spent: *3minutes  Recommendations for Outpatient Follow-up:  1. Follow up PCP in 2 weeks, check CBC and Hemoccults in 2 weeks 2. Will need to check PT/INR in 3 days  Discharge Diagnoses:  Principal Problem:   GIB (gastrointestinal bleeding) Active Problems:   Hypothyroidism   Anxiety state   Depression   Essential hypertension   Allergic rhinitis   Renal mass, left   Chronic back pain   Atrial fibrillation, persistent with asymptomatic pauses   Warfarin anticoagulation   History of gastric ulcer   COPD (chronic obstructive pulmonary disease)   Peripheral venous insufficiency   Elevated INR   Diverticulitis of colon   UTI (urinary tract infection)   Discharge Condition: Stable  Diet recommendation: Low salt diet  Filed Weights   11/30/14 2101  Weight: 66.679 kg (147 lb)    History of present illness:  81 y.o. female with PMH of afib on Coumadin, HTN, diverticulitis, GI bleeding, COPD, chronic back pain, hypothyroidism, depression, sciatica, who presents with the GI bleeding.  Patient reports that she has intermittent GI bleeding in the past 2 years. In the past several days her GIB has been worsening, she has rectal bleeding almost every day, at least once each day. She states that the stool is mixed with dark and bright red colored blood. She has mild abdominal pain in lower abdomen. She does not have chest pain, shortness of breath, dizziness, cough, fever, chills. She denies symptoms of UTI, but the nurse noticed that patient has an increased urinary frequency. Patient does not have rashes, unilateral weakness.  Hospital Course:  1. Diverticulitis- CT scan shows sigmoid diverticulitis, patient was started on Flagyl as outpatient. Started ceftriaxone 1 g IV daily. Patient has received 4 days of  antibiotic therapy. Before coming to the hospital she also was on Cipro and Flagyl. At this time I'm going to send her on 7 more days of Augmentin and no more Flagyl or Cipro as it can affect the INR. 2. GI bleed- patient complained of intermittent bleeding mixed with the stool, likely from diverticulitis. She has a history of diverticular disease. Patient's hemoglobin has remained stable last hemoglobin and hematocrit was 11.6/35.7 on 12/02/2014. She was seen by gastroenterology Dr. Cristina Gong, and he recommends to follow hemoccult and hemoglobin as outpatient by the PCP. At this time no indication for any intervention. Patient might benefit from sigmoidoscopy after she completes the treatment for diverticulitis, and if still has ongoing lower GI bleed with guaiac-positive stools. 3. Coagulopathy- patient is on anticoagulation for A. Fib, came with INR greater than 10. Likely from infection due to Flagyl Patient received vitamin K, today INR is 1.52. Patient can resume Coumadin. 4. UTI- this morning patient found to have abnormal UA with past nitrite. Will obtain urine culture. Patient has been started on empiric ceftriaxone. Urine culture grew enterococcus, sensitive to ampicillin. Will send home on Po Augmentin for seven more days 5. Atrial fibrillation- CHA2DS2-VASc Score is 4. Patient on Coumadin. Will restart the coumadin as per GI recommendation..  6. COPD -stable, continue when necessary albuterol  7. Hypertension- blood pressure is elevated continue Lotensin,, Catapres. Will change the dose of Bystolic to 10  mg po daily. 8. Renal mass- CT scan shows left renal mass concerning for renal cell carcinoma, has seen Urology Dr Williams Che as outpatient as per daughter. 9. Hypothyroidism-  continue Synthroid     Consultations:  GI  Discharge Exam: Filed Vitals:   12/03/14 0548  BP: 172/106  Pulse: 93  Temp: 98.2 F (36.8 C)  Resp: 20    General: Appears in no acute distress Cardiovascular:  S1S2 RRR Respiratory: Clear bilaterally  Discharge Instructions   Discharge Instructions    Diet - low sodium heart healthy    Complete by:  As directed      Discharge instructions    Complete by:  As directed   Home health RN can check Pt/INR in 3 days     Increase activity slowly    Complete by:  As directed           Current Discharge Medication List    START taking these medications   Details  amoxicillin-clavulanate (AUGMENTIN) 875-125 MG per tablet Take 1 tablet by mouth 2 (two) times daily. Qty: 14 tablet, Refills: 0      CONTINUE these medications which have CHANGED   Details  spironolactone (ALDACTONE) 25 MG tablet Take 1 tablet (25 mg total) by mouth 2 (two) times daily. Qty: 60 tablet, Refills: 2      CONTINUE these medications which have NOT CHANGED   Details  ALPRAZolam (XANAX) 0.25 MG tablet Take 0.25 mg by mouth every 8 (eight) hours as needed for anxiety.     benazepril (LOTENSIN) 40 MG tablet TAKE 1 TABLET BY MOUTH EVERY DAY *MUST KEEP 04-05-14 APPOINTMENT* Qty: 30 tablet, Refills: 10    bisoprolol (ZEBETA) 5 MG tablet Take 5 mg by mouth daily.    cloNIDine (CATAPRES - DOSED IN MG/24 HR) 0.1 mg/24hr patch Place 1 patch onto the skin once a week. Refills: 2    dicyclomine (BENTYL) 10 MG capsule Take 10 mg by mouth 4 (four) times daily as needed for spasms.     imipramine (TOFRANIL) 10 MG tablet Take 30 mg by mouth at bedtime.    levothyroxine (SYNTHROID, LEVOTHROID) 88 MCG tablet Take 88 mcg by mouth daily before breakfast.    loperamide (IMODIUM) 2 MG capsule Take 2 mg by mouth as needed for diarrhea or loose stools.    traMADol (ULTRAM) 50 MG tablet Take 50 mg by mouth every 8 (eight) hours as needed. for pain Refills: 1    warfarin (COUMADIN) 2.5 MG tablet Take 1 to 1.5 tablets by mouth daily as directed by coumadin clinic.  Needs INR for further refills Qty: 35 tablet, Refills: 0      STOP taking these medications     metroNIDAZOLE  (FLAGYL) 500 MG tablet      ciprofloxacin (CIPRO) 500 MG tablet        Allergies  Allergen Reactions  . Levofloxacin Other (See Comments)    REACTION: unspecified per [facility]   Follow-up Information    Follow up with [place].   Why:  HHPT   Contact information:   [address] [place] [address] [phone]       Please follow up.   Why:  HHPT/HHOT       The results of significant diagnostics from this hospitalization (including imaging, microbiology, ancillary and laboratory) are listed below for reference.    Significant Diagnostic Studies: Ct Abdomen Pelvis W Contrast  11/30/2014   CLINICAL DATA:  Pt has been having rectal bleeding for 3 days. Today large amount of bright red blood.  EXAM: CT ABDOMEN AND PELVIS WITH CONTRAST  TECHNIQUE: Multidetector CT imaging of the abdomen and pelvis was  performed using the standard protocol following bolus administration of intravenous contrast.  CONTRAST:  17mL OMNIPAQUE IOHEXOL 300 MG/ML  SOLN  COMPARISON:  07/19/14  FINDINGS: Lower chest:  No acute findings  Hepatobiliary: Mild intrahepatic and extrahepatic biliary dilitation, slightly more prominent when compared to the prior study. This may be the result of status post cholecystectomy.A 1cm low attenuation in the right hepatic lobe is present; previously it appeared consistent with a cyst, but does not appear convincingly cystic on the current study. This could be due to differences in scan plane selection given the small size of this finding.  Pancreas: Mild pancreatic ductal dilatation similar to prior study  Spleen: Normal  Adrenals/Urinary Tract: Simple appearing and complex appearing cysts right kidney complex nipple showing mural calcification. Upper pole hyper attenuating left renal lesion measures about 3 cm. It shows average attenuation value. Seventy-eight. It is unchanged. Stable 1.2 cm left adrenal nodule as previously described.  Stomach/Bowel: The  distal 9 cm of the sigmoid colon appears abnormal. Shows severe wall thickening with areas of heterogeneous low attenuation within the thickened wall. Proximally there is mild dilatation with large volume of stool in the immediately proximal colon. Diverticulosis in the proximal sigmoid colon. Overall there is a nonobstructive bowel gas pattern.  Vascular/Lymphatic: Moderate to severe aortoiliac calcification  Reproductive: Not seen  Other: No ascites  Musculoskeletal: No acute findings  IMPRESSION: 1. Severe wall thickening and irregularity in the distal half of the sigmoid colon. This could, as has been previously suggested, reflect diverticulitis. However, malignancy is not excluded.  2. Stable hyperattenuating left renal mass concerning for renal cell carcinoma.  3. Mild biliary and pancreatic duct dilitation, slightly more conspicuous when compared to prior study. No pancreatic head mass identified. This may be related to prior cholecystectomy.  4.  Small nonspecific right hepatic lesion not well characterized.   Electronically Signed   By: Skipper Cliche M.D.   On: 11/30/2014 18:20    Microbiology: Recent Results (from the past 240 hour(s))  Urine culture     Status: None (Preliminary result)   Collection Time: 12/01/14  6:50 PM  Result Value Ref Range Status   Specimen Description URINE, CLEAN CATCH  Final   Special Requests NONE  Final   Culture   Final    >=100,000 COLONIES/mL ENTEROCOCCUS SPECIES Performed at [facility]    Report Status PENDING  Incomplete     Labs: Basic Metabolic Panel:  Recent Labs Lab 11/30/14 1611 12/01/14 0800  NA 136 134*  K 3.8 3.8  CL 100* 97*  CO2 28 31  GLUCOSE 121* 115*  BUN 21* 28*  CREATININE 0.73 1.09*  CALCIUM 9.1 9.8   Liver Function Tests:  Recent Labs Lab 11/30/14 1611  AST 25  ALT 17  ALKPHOS 80  BILITOT 0.7  PROT 6.9  ALBUMIN 3.5   No results for input(s): LIPASE, AMYLASE in the last 168 hours. No results for  input(s): AMMONIA in the last 168 hours. CBC:  Recent Labs Lab 11/30/14 1611  12/01/14 1442 12/01/14 2014 12/02/14 0235 12/02/14 0847 12/02/14 1445  WBC 9.5  < > 8.8 15.3* 12.8* 13.0* 12.3*  NEUTROABS 6.1  --   --   --   --   --   --   HGB 12.5  < > 10.3* 11.1* 9.9* 10.9* 11.6*  HCT 38.3  < > 32.6* 34.7* 29.9* 33.8* 35.7*  MCV 86.5  < > 87.6 87.4 87.2 86.9 86.7  PLT 430*  < >  392 429* 417* 428* 471*  < > = values in this interval not displayed. Cardiac Enzymes: No results for input(s): CKTOTAL, CKMB, CKMBINDEX, TROPONINI in the last 168 hours. BNP: BNP (last 3 results) No results for input(s): BNP in the last 8760 hours.  ProBNP (last 3 results) No results for input(s): PROBNP in the last 8760 hours.  CBG:  Recent Labs Lab 12/01/14 0731 12/02/14 0737 12/03/14 0851  GLUCAP 77 122* 111*       Signed:  Tykeem Lanzer S  [facility] 12/03/2014, 12:55 PM

## 2014-12-04 DIAGNOSIS — K5791 Diverticulosis of intestine, part unspecified, without perforation or abscess with bleeding: Secondary | ICD-10-CM

## 2014-12-04 LAB — GLUCOSE, CAPILLARY: GLUCOSE-CAPILLARY: 95 mg/dL (ref 65–99)

## 2014-12-04 LAB — URINE CULTURE

## 2014-12-04 LAB — CBC
HEMATOCRIT: 36.7 % (ref 36.0–46.0)
HEMOGLOBIN: 11.9 g/dL — AB (ref 12.0–15.0)
MCH: 28.1 pg (ref 26.0–34.0)
MCHC: 32.4 g/dL (ref 30.0–36.0)
MCV: 86.8 fL (ref 78.0–100.0)
Platelets: 467 10*3/uL — ABNORMAL HIGH (ref 150–400)
RBC: 4.23 MIL/uL (ref 3.87–5.11)
RDW: 14.9 % (ref 11.5–15.5)
WBC: 10.1 10*3/uL (ref 4.0–10.5)

## 2014-12-04 LAB — PROTIME-INR
INR: 1.63 — AB (ref 0.00–1.49)
PROTHROMBIN TIME: 19.4 s — AB (ref 11.6–15.2)

## 2014-12-04 MED ORDER — HYDRALAZINE HCL 25 MG PO TABS
25.0000 mg | ORAL_TABLET | Freq: Four times a day (QID) | ORAL | Status: DC | PRN
  Administered 2014-12-04: 25 mg via ORAL
  Filled 2014-12-04 (×2): qty 1

## 2014-12-04 MED ORDER — WARFARIN SODIUM 2.5 MG PO TABS
2.5000 mg | ORAL_TABLET | Freq: Once | ORAL | Status: DC
  Filled 2014-12-04: qty 1

## 2014-12-04 MED ORDER — PANTOPRAZOLE SODIUM 40 MG PO TBEC
40.0000 mg | DELAYED_RELEASE_TABLET | Freq: Two times a day (BID) | ORAL | Status: DC
  Administered 2014-12-04: 40 mg via ORAL
  Filled 2014-12-04: qty 1

## 2014-12-04 MED ORDER — BISOPROLOL FUMARATE 10 MG PO TABS: 10.0000 mg | ORAL_TABLET | Freq: Every day | ORAL | Status: DC

## 2014-12-04 NOTE — Progress Notes (Signed)
ANTICOAGULATION CONSULT NOTE - Initial Consult  Pharmacy Consult for Warfarin Indication: atrial fibrillation  Allergies  Allergen Reactions  . Levofloxacin Other (See Comments)    REACTION: unspecified per [facility]    Patient Measurements: Height: 5\' 2"  (157.5 cm) Weight: 147 lb (66.679 kg) IBW/kg (Calculated) : 50.1  Vital Signs: Temp: 98.9 F (37.2 C) (09/13 0512) Temp Source: Oral (09/13 0512) BP: 168/108 mmHg (09/13 0512) Pulse Rate: 107 (09/13 0512)  Labs:  Recent Labs  12/01/14 0800  12/02/14 0235 12/02/14 0847 12/02/14 1445 12/03/14 0441 12/04/14 0547  HGB  --   < > 9.9* 10.9* 11.6*  --   --   HCT  --   < > 29.9* 33.8* 35.7*  --   --   PLT  --   < > 417* 428* 471*  --   --   LABPROT  --   --  17.0*  --   --  18.3* 19.4*  INR  --   --  1.37  --   --  1.52* 1.63*  CREATININE 1.09*  --   --   --   --   --   --   < > = values in this interval not displayed.  Estimated Creatinine Clearance: 35.6 mL/min (by C-G formula based on Cr of 1.09).   Medical History: Past Medical History  Diagnosis Date  . Osteoporosis   . Hypertension   . Hypothyroidism   . Lower GI bleeding 07/21/11  . Atrial fibrillation   . COPD (chronic obstructive pulmonary disease)   . Pneumonia   . Shortness of breath 07/21/11    "anytime"  . Sinus headache 07/21/11    "all the time"  . Bladder infection, chronic   . Arthritis     "everywhere"  . Anxiety   . Chronic lower back pain   . Chronic abdominal pain   . Renal mass, left 2013  . PUD (peptic ulcer disease)   . Chronic cystitis   . Trigeminal neuralgia     /E-chart  . Chronic kidney disease   . Anemia     Medications:  Prescriptions prior to admission  Medication Sig Dispense Refill Last Dose  . ALPRAZolam (XANAX) 0.25 MG tablet Take 0.25 mg by mouth every 8 (eight) hours as needed for anxiety.    11/29/2014 at Unknown time  . benazepril (LOTENSIN) 40 MG tablet TAKE 1 TABLET BY MOUTH EVERY DAY *MUST KEEP 04-05-14 APPOINTMENT*  (Patient taking differently: Take 40 mg by mouth daily. TAKE 1 TABLET BY MOUTH EVERY DAY *MUST KEEP 04-05-14 APPOINTMENT*) 30 tablet 10 11/30/2014 at Unknown time  . bisoprolol (ZEBETA) 5 MG tablet Take 5 mg by mouth daily.   11/30/2014 at 1400  . cloNIDine (CATAPRES - DOSED IN MG/24 HR) 0.1 mg/24hr patch Place 1 patch onto the skin once a week.  2 11/28/2014 at Unknown time  . dicyclomine (BENTYL) 10 MG capsule Take 10 mg by mouth 4 (four) times daily as needed for spasms.    Past Week at Unknown time  . imipramine (TOFRANIL) 10 MG tablet Take 30 mg by mouth at bedtime.   11/29/2014 at Unknown time  . levothyroxine (SYNTHROID, LEVOTHROID) 88 MCG tablet Take 88 mcg by mouth daily before breakfast.   11/30/2014 at 0600  . loperamide (IMODIUM) 2 MG capsule Take 2 mg by mouth as needed for diarrhea or loose stools.   Past Week at Unknown time  . metroNIDAZOLE (FLAGYL) 500 MG tablet Take 1 tablet (500 mg  total) by mouth 2 (two) times daily. (Patient taking differently: Take 500 mg by mouth 3 (three) times daily. ) 20 tablet 0 11/30/2014 at Unknown time  . traMADol (ULTRAM) 50 MG tablet Take 50 mg by mouth every 8 (eight) hours as needed. for pain  1 11/29/2014 at 2015  . warfarin (COUMADIN) 2.5 MG tablet Take 1 to 1.5 tablets by mouth daily as directed by coumadin clinic.  Needs INR for further refills (Patient taking differently: Take 2.5 mg by mouth daily at 6 PM. ) 35 tablet 0 11/29/2014 at 1800  . [DISCONTINUED] spironolactone (ALDACTONE) 25 MG tablet Take 25 mg by mouth 2 (two) times daily.    11/30/2014 at Unknown time  . ciprofloxacin (CIPRO) 500 MG tablet Take 1 tablet (500 mg total) by mouth every 12 (twelve) hours. (Patient not taking: Reported on 11/30/2014) 20 tablet 0 Not Taking at Unknown time   Scheduled:  . amoxicillin-clavulanate  1 tablet Oral Q12H  . benazepril  40 mg Oral Daily  . bisoprolol  5 mg Oral Daily  . cloNIDine  0.1 mg Transdermal Weekly  . imipramine  30 mg Oral QHS  . levothyroxine  88 mcg  Oral QAC breakfast  . pantoprazole (PROTONIX) IV  40 mg Intravenous Q12H  . spironolactone  25 mg Oral BID  . Warfarin - Pharmacist Dosing Inpatient   Does not apply q1800    Assessment: 81 y.o. female with PMH of afib on Coumadin, HTN, diverticulitis, GI bleeding, COPD, chronic back pain, hypothyroidism, depression, sciatica, who presents with GIB.  Noted INR > 10 (likely due to Flagyl outpatient)   Baseline INR, aPTT: high on admit  Prior anticoagulation: Warfarin 2.5 mg daily  Significant events:  Today, 12/04/2014:  CBC: none today; previously acceptable  INR still subtherapeutic with minimal rise as expected after warfarin x 1.  Major drug interactions: Flagyl stopped, now on Augmentin  Note FOBT+ yesterday AM - Spoke with GI; feels patient can restart warfarin with close f/u since did not have significant bleeding with much higher INR and could be residual stool from earlier this admission.  Eating 76% of meals  Goal of Therapy: INR 2-3  Plan:  Repeat warfarin 2.5 mg PO tonight at 16:00. May consider lower dose tomorrow if INR rises sharply, given recent antibiotics and acute illness.  If discharged, would send home on original dose of 2.5 mg warfarin daily with close follow up, as patient is at some risk of becoming supratherapeutic given above events.  Daily INR  CBC at least q72 hr while on warfarin - ordered for tomorrow  Monitor for signs of bleeding or thrombosis   Reuel Boom, PharmD Pager: [phone] 12/04/2014, 7:14 AM

## 2014-12-04 NOTE — Care Management Note (Signed)
Case Management Note  Patient Details  Name: Natalie [name] MRN: [id number] Date of Birth: 03-08-1931  Subjective/Objective:Conferenced w/[facility] rep Cyril Mourning about [place] vs SNF, & recommendations.Spoke to patient about concerns about d/c to home w/HHC. PT-recc HHPT/supv per most recent note.Explained to patient that [facility] will provide the maximum of [place] services & will also put in social worker who will have a fl2-in case SNF is more appropriate once @ home.Patient also informed of private care giver services(out of pocket expense).Contacted our [place] who will give fl2 to patient in rm. MD updated.                    Action/Plan:d/c plan home w/HHC.   Expected Discharge Date:                  Expected Discharge Plan:  [place]  In-House Referral:     Discharge planning Services  CM Consult  Post Acute Care Choice:    Choice offered to:  Patient  DME Arranged:    DME Agency:     HH Arranged:  PT, OT, RN [place] Agency:  [place]  Status of Service:  Completed, signed off  Medicare Important Message Given:  Yes-second notification given Date Medicare IM Given:    Medicare IM give by:    Date Additional Medicare IM Given:    Additional Medicare Important Message give by:     If discussed at [place] of Stay Meetings, dates discussed:    Additional Comments:  Dessa Phi, RN 12/04/2014, 12:23 PM

## 2014-12-04 NOTE — Care Management Note (Signed)
Case Management Note  Patient Details  Name: JAMELLE NOY MRN: [id number] Date of Birth: 1930-09-10  Subjective/Objective:   [facility] rep Kristen aware of [facility] orders, & d/c.                 Action/Plan:d/c home w/HHC.   Expected Discharge Date:                  Expected Discharge Plan:  [place]  In-House Referral:     Discharge planning Services  CM Consult  Post Acute Care Choice:    Choice offered to:  Patient  DME Arranged:    DME Agency:     HH Arranged:  PT, OT, RN [place] Agency:  [place]  Status of Service:  Completed, signed off  Medicare Important Message Given:  Yes-second notification given Date Medicare IM Given:    Medicare IM give by:    Date Additional Medicare IM Given:    Additional Medicare Important Message give by:     If discussed at [place] of Stay Meetings, dates discussed:    Additional Comments:  Dessa Phi, RN 12/04/2014, 10:30 AM

## 2014-12-06 NOTE — ED Notes (Signed)
Chart opened to document wasted medication. This Designer, multimedia Autumn went waste medication in pyxis the day of visit after transfer of patient however, patient no longer in pyxis.  A total of 87.70mcg of fentanyl wasted in sink with Clementeen Hoof RN.

## 2014-12-15 ENCOUNTER — Emergency Department (HOSPITAL_COMMUNITY)
Admission: EM | Admit: 2014-12-15 | Discharge: 2014-12-15 | Disposition: A | Discharge status: Home / Self Care | Payer: Medicare Other | Attending: Emergency Medicine | Admitting: Emergency Medicine

## 2014-12-15 ENCOUNTER — Encounter (HOSPITAL_COMMUNITY): Payer: Self-pay | Admitting: Emergency Medicine

## 2014-12-15 ENCOUNTER — Emergency Department (HOSPITAL_COMMUNITY): Payer: Medicare Other

## 2014-12-15 DIAGNOSIS — G8929 Other chronic pain: Secondary | ICD-10-CM | POA: Diagnosis not present

## 2014-12-15 DIAGNOSIS — Z7901 Long term (current) use of anticoagulants: Secondary | ICD-10-CM | POA: Diagnosis not present

## 2014-12-15 DIAGNOSIS — Z8701 Personal history of pneumonia (recurrent): Secondary | ICD-10-CM | POA: Insufficient documentation

## 2014-12-15 DIAGNOSIS — R109 Unspecified abdominal pain: Secondary | ICD-10-CM | POA: Diagnosis present

## 2014-12-15 DIAGNOSIS — Z87448 Personal history of other diseases of urinary system: Secondary | ICD-10-CM | POA: Diagnosis not present

## 2014-12-15 DIAGNOSIS — Z87891 Personal history of nicotine dependence: Secondary | ICD-10-CM | POA: Diagnosis not present

## 2014-12-15 DIAGNOSIS — I129 Hypertensive chronic kidney disease with stage 1 through stage 4 chronic kidney disease, or unspecified chronic kidney disease: Secondary | ICD-10-CM | POA: Diagnosis not present

## 2014-12-15 DIAGNOSIS — Z79899 Other long term (current) drug therapy: Secondary | ICD-10-CM | POA: Diagnosis not present

## 2014-12-15 DIAGNOSIS — M1990 Unspecified osteoarthritis, unspecified site: Secondary | ICD-10-CM | POA: Insufficient documentation

## 2014-12-15 DIAGNOSIS — J449 Chronic obstructive pulmonary disease, unspecified: Secondary | ICD-10-CM | POA: Insufficient documentation

## 2014-12-15 DIAGNOSIS — F419 Anxiety disorder, unspecified: Secondary | ICD-10-CM | POA: Diagnosis not present

## 2014-12-15 DIAGNOSIS — K6389 Other specified diseases of intestine: Secondary | ICD-10-CM | POA: Diagnosis not present

## 2014-12-15 DIAGNOSIS — R197 Diarrhea, unspecified: Secondary | ICD-10-CM | POA: Diagnosis not present

## 2014-12-15 DIAGNOSIS — E039 Hypothyroidism, unspecified: Secondary | ICD-10-CM | POA: Diagnosis not present

## 2014-12-15 DIAGNOSIS — N189 Chronic kidney disease, unspecified: Secondary | ICD-10-CM | POA: Insufficient documentation

## 2014-12-15 DIAGNOSIS — Z862 Personal history of diseases of the blood and blood-forming organs and certain disorders involving the immune mechanism: Secondary | ICD-10-CM | POA: Diagnosis not present

## 2014-12-15 LAB — COMPREHENSIVE METABOLIC PANEL
ALBUMIN: 3.4 g/dL — AB (ref 3.5–5.0)
ALK PHOS: 48 U/L (ref 38–126)
ALT: 29 U/L (ref 14–54)
AST: 37 U/L (ref 15–41)
Anion gap: 11 (ref 5–15)
BILIRUBIN TOTAL: 0.9 mg/dL (ref 0.3–1.2)
BUN: 9 mg/dL (ref 6–20)
CO2: 28 mmol/L (ref 22–32)
CREATININE: 0.79 mg/dL (ref 0.44–1.00)
Calcium: 9.3 mg/dL (ref 8.9–10.3)
Chloride: 94 mmol/L — ABNORMAL LOW (ref 101–111)
GFR calc Af Amer: >60 mL/min (ref >60)
GLUCOSE: 97 mg/dL (ref 65–99)
POTASSIUM: 4.2 mmol/L (ref 3.5–5.1)
Sodium: 133 mmol/L — ABNORMAL LOW (ref 135–145)
TOTAL PROTEIN: 6.5 g/dL (ref 6.5–8.1)

## 2014-12-15 LAB — CBC WITH DIFFERENTIAL/PLATELET
BASOS PCT: 0 %
Basophils Absolute: 0 10*3/uL (ref 0.0–0.1)
EOS PCT: 1 %
Eosinophils Absolute: 0.1 10*3/uL (ref 0.0–0.7)
HEMATOCRIT: 37.6 % (ref 36.0–46.0)
Hemoglobin: 12.4 g/dL (ref 12.0–15.0)
LYMPHS PCT: 32 %
Lymphs Abs: 2.9 10*3/uL (ref 0.7–4.0)
MCH: 28.8 pg (ref 26.0–34.0)
MCHC: 33 g/dL (ref 30.0–36.0)
MCV: 87.2 fL (ref 78.0–100.0)
MONO ABS: 0.9 10*3/uL (ref 0.1–1.0)
MONOS PCT: 10 %
NEUTROS ABS: 5 10*3/uL (ref 1.7–7.7)
Neutrophils Relative %: 57 %
PLATELETS: 386 10*3/uL (ref 150–400)
RBC: 4.31 MIL/uL (ref 3.87–5.11)
RDW: 14.5 % (ref 11.5–15.5)
WBC: 8.9 10*3/uL (ref 4.0–10.5)

## 2014-12-15 LAB — PROTIME-INR
INR: 3.05 — ABNORMAL HIGH (ref 0.00–1.49)
Prothrombin Time: 31 seconds — ABNORMAL HIGH (ref 11.6–15.2)

## 2014-12-15 LAB — LIPASE, BLOOD: Lipase: 39 U/L (ref 22–51)

## 2014-12-15 MED ORDER — IOHEXOL 300 MG/ML  SOLN
25.0000 mL | Freq: Once | INTRAMUSCULAR | Status: AC | PRN
  Administered 2014-12-15: 25 mL via ORAL

## 2014-12-15 MED ORDER — ALPRAZOLAM 0.25 MG PO TABS
0.2500 mg | ORAL_TABLET | Freq: Once | ORAL | Status: AC
Start: 2014-12-15 — End: 2014-12-15
  Administered 2014-12-15: 0.25 mg via ORAL
  Filled 2014-12-15: qty 1

## 2014-12-15 MED ORDER — IOHEXOL 300 MG/ML  SOLN
80.0000 mL | Freq: Once | INTRAMUSCULAR | Status: AC | PRN
  Administered 2014-12-15: 80 mL via INTRAVENOUS

## 2014-12-15 MED ORDER — SODIUM CHLORIDE 0.9 % IV BOLUS (SEPSIS)
1000.0000 mL | Freq: Once | INTRAVENOUS | Status: AC
  Administered 2014-12-15: 1000 mL via INTRAVENOUS

## 2014-12-15 NOTE — ED Notes (Addendum)
Pt has rung call bell multiple times with it being answered and calls immediately after it is answered. Pt has been on and off a bedpan every 15-20 minutes-- states that she cannot urinate on a bedpan. Pt states she has not taken her morning anxiety medicine-- Dr, Regenia Skeeter made aware-- orders received.

## 2014-12-15 NOTE — Progress Notes (Signed)
This consult for CM was placed in error. Pt is 81 yo and this is for OB follow up.

## 2014-12-15 NOTE — ED Provider Notes (Signed)
CSN: [id number]     Arrival date & time 12/15/14  2993 History   First MD Initiated Contact with Patient 12/15/14 [phone]     Chief Complaint  Patient presents with  . Abdominal Pain  . Back Pain  . Diarrhea     (Consider location/radiation/quality/duration/timing/severity/associated sxs/prior Treatment) HPI  81 year old female presents with diarrhea for the last 2 weeks. States yesterday she called her doctor who told her to come to the ER. Patient states the diarrhea has been waxing and waning but averages about 4-5 stools per day. Loose and watery. One time last week had blood but none now. Has been having abdominal pain, mostly left lower. Recently admitted and discharged for diverticulitis. Finished up antibiotics for a UTI per her but also appears to be treating diverticulitis. Denies any fevers. Denies any chest pain or urinary symptoms. Has chronic back pain, not worse now. Declines pain medicine now, states her pain is minimal.  Past Medical History  Diagnosis Date  . Osteoporosis   . Hypertension   . Hypothyroidism   . Lower GI bleeding 07/21/11  . Atrial fibrillation   . COPD (chronic obstructive pulmonary disease)   . Pneumonia   . Shortness of breath 07/21/11    "anytime"  . Sinus headache 07/21/11    "all the time"  . Bladder infection, chronic   . Arthritis     "everywhere"  . Anxiety   . Chronic lower back pain   . Chronic abdominal pain   . Renal mass, left 2013  . PUD (peptic ulcer disease)   . Chronic cystitis   . Trigeminal neuralgia     /E-chart  . Chronic kidney disease   . Anemia    Past Surgical History  Procedure Laterality Date  . Vaginal hysterectomy      partial  . Cataract extraction w/ intraocular lens  implant, bilateral    . Percutaneous pinning femoral neck fracture  11/2003    w/closed reduction ; left/E-chart  . Laceration repair  11/2003    left eyebrow  . Bilateral salpingoophorectomy  12/2005    lap/E-chart  . Cholecystectomy  1960   . Appendectomy      presumed/E-chart  . Esophagogastroduodenoscopy  07/23/2011    Procedure: ESOPHAGOGASTRODUODENOSCOPY (EGD);  Surgeon: Wonda Horner, MD;  [facility] ENDOSCOPY;  Service: Endoscopy;  Laterality: N/A;   Family History  Problem Relation Age of Onset  . Prostate cancer Father     Patient states that his father probably had prostate cancer   Social History  Substance Use Topics  . Smoking status: Former Smoker -- 1.00 packs/day    Types: Cigarettes    Quit date: 09/11/2009  . Smokeless tobacco: Never Used     Comment: 07/21/11 "can't remember when I quit smoking"  . Alcohol Use: 1.2 oz/week    2 Glasses of wine per week     Comment: 07/21/11 "seldom drink"   OB History    No data available     Review of Systems  Constitutional: Negative for fever.  Gastrointestinal: Positive for abdominal pain and diarrhea. Negative for nausea, vomiting and blood in stool.  Genitourinary: Negative for dysuria.  Musculoskeletal: Positive for back pain.  All other systems reviewed and are negative.     Allergies  Levofloxacin  Home Medications   Prior to Admission medications   Medication Sig Start Date End Date Taking? Authorizing Provider  ALPRAZolam (XANAX) 0.25 MG tablet Take 0.25 mg by mouth every 8 (eight) hours as  needed for anxiety.     Historical Provider, MD  amoxicillin-clavulanate (AUGMENTIN) 875-125 MG per tablet Take 1 tablet by mouth 2 (two) times daily. 12/03/14   Oswald Hillock, MD  benazepril (LOTENSIN) 40 MG tablet TAKE 1 TABLET BY MOUTH EVERY DAY *MUST KEEP 04-05-14 APPOINTMENT* Patient taking differently: Take 40 mg by mouth daily. TAKE 1 TABLET BY MOUTH EVERY DAY *MUST KEEP 04-05-14 APPOINTMENT* 04/20/14   Mihai Croitoru, MD  bisoprolol (ZEBETA) 10 MG tablet Take 1 tablet (10 mg total) by mouth daily. 12/04/14   Oswald Hillock, MD  cloNIDine (CATAPRES - DOSED IN MG/24 HR) 0.1 mg/24hr patch Place 1 patch onto the skin once a week. 11/21/14   Historical Provider,  MD  dicyclomine (BENTYL) 10 MG capsule Take 10 mg by mouth 4 (four) times daily as needed for spasms.     Historical Provider, MD  imipramine (TOFRANIL) 10 MG tablet Take 30 mg by mouth at bedtime. 07/11/14   Historical Provider, MD  levothyroxine (SYNTHROID, LEVOTHROID) 88 MCG tablet Take 88 mcg by mouth daily before breakfast.    Historical Provider, MD  loperamide (IMODIUM) 2 MG capsule Take 2 mg by mouth as needed for diarrhea or loose stools.    Historical Provider, MD  spironolactone (ALDACTONE) 25 MG tablet Take 1 tablet (25 mg total) by mouth 2 (two) times daily. 12/03/14   Oswald Hillock, MD  traMADol (ULTRAM) 50 MG tablet Take 50 mg by mouth every 8 (eight) hours as needed. for pain 04/26/14   Historical Provider, MD  warfarin (COUMADIN) 2.5 MG tablet Take 1 to 1.5 tablets by mouth daily as directed by coumadin clinic.  Needs INR for further refills Patient taking differently: Take 2.5 mg by mouth daily at 6 PM.  12/08/13   Mihai Croitoru, MD   There were no vitals taken for this visit. Physical Exam  Constitutional: She is oriented to person, place, and time. She appears well-developed and well-nourished. No distress.  HENT:  Head: Normocephalic and atraumatic.  Right Ear: External ear normal.  Left Ear: External ear normal.  Nose: Nose normal.  Eyes: Right eye exhibits no discharge. Left eye exhibits no discharge.  Cardiovascular: Normal rate, regular rhythm and normal heart sounds.   Pulmonary/Chest: Effort normal and breath sounds normal.  Abdominal: Soft. There is tenderness in the left lower quadrant.  Neurological: She is alert and oriented to person, place, and time.  Skin: Skin is warm and dry. She is not diaphoretic.  Nursing note and vitals reviewed.   ED Course  Procedures (including critical care time) Labs Review Labs Reviewed  COMPREHENSIVE METABOLIC PANEL - Abnormal; Notable for the following:    Sodium 133 (*)    Chloride 94 (*)    Albumin 3.4 (*)    All other  components within normal limits  PROTIME-INR - Abnormal; Notable for the following:    Prothrombin Time 31.0 (*)    INR 3.05 (*)    All other components within normal limits  C DIFFICILE QUICK SCREEN W PCR REFLEX  STOOL CULTURE  LIPASE, BLOOD  CBC WITH DIFFERENTIAL/PLATELET    Imaging Review Ct Abdomen Pelvis W Contrast  12/15/2014   CLINICAL DATA:  Left sided abdominal pain and diarrhea.  EXAM: CT ABDOMEN AND PELVIS WITH CONTRAST  TECHNIQUE: Multidetector CT imaging of the abdomen and pelvis was performed using the standard protocol following bolus administration of intravenous contrast.  CONTRAST:  51mL OMNIPAQUE IOHEXOL 300 MG/ML  SOLN  COMPARISON:  11/30/2014 and  02/20/2013  FINDINGS: Probable mild scarring at the lingula. Otherwise, the lung bases are clear. Negative for free intraperitoneal air.  Stable dilatation of the intrahepatic and extrahepatic biliary system compatible with previous cholecystectomy. Stable 0.9 cm structure the right hepatic lobe may represent a hemangioma. Portal venous system is patent. Mild dilatation of the main pancreatic duct is stable. There is a large duodenal diverticulum near the pancreatic head and ampulla.  Normal appearance of the adrenal glands. Again noted are low-density cysts in the right kidney with cortical scarring. One of the right kidney cyst has a chronic calcification. Again noted is a slightly dense exophytic structure along the posterior right kidney which has not significantly changed since 2014. Again noted is a lesion in the left kidney lower pole region which extends anteriorly. This is a known to be an enhancing lesion and concerning for renal cell carcinoma. The lesion measures roughly 3.0 x 1.7 cm on today's examination and measured 2.3 x 1.7 cm on 02/20/2013. Normal appearance of the spleen.  Atherosclerotic calcifications involving the aorta and iliac arteries without dilatation or aneurysm. Uterus has been removed. Fluid in the urinary  bladder.  There is a large amount of gas in the rectum. Again noted is marked wall thickening and heterogeneity of the sigmoid colon, best seen on sequence 2, image 57. The wall thickening is along the superior aspect of this sigmoid colon segment. There has been a similar finding since 07/19/2014 but this is new from 07/21/2011. The superior aspect of the bowel wall measures greater than to 3 cm on the sagittal reformats, sequence 6, image 63. The margins of this wall thickening has some irregularity. Mild to moderate dilatation of the transverse colon and right colon. Right inguinal hernia containing a small segment of small bowel. No evidence for small bowel dilatation. Again noted is a very small ventral hernia containing fat in the anterior pelvis.  Surgical screws extending through the left femoral head neck. Both hips are located.  IMPRESSION: Persistent asymmetric wall thickening involving a segment of the sigmoid colon. Findings are concerning for focal neoplasm at this location. An area of chronic inflammation is the differential diagnosis but neoplasm needs to be excluded. Recommend correlation with colonoscopy. Mild distension of the proximal colon but there is gas in the rectum.  Solid lesion in the left kidney. This lesion has demonstrated interval growth since 2014 and most compatible with a renal cell carcinoma. No evidence for metastatic disease.  These results were called by telephone at the time of interpretation on 12/15/2014 at 1:04 pm to Dr. Sherwood Gambler , who verbally acknowledged these results.   Electronically Signed   By: Markus Daft M.D.   On: 12/15/2014 13:04   I have personally reviewed and evaluated these images and lab results as part of my medical decision-making.   EKG Interpretation   Date/Time:  Saturday December 15 2014 09:01:48 EDT Ventricular Rate:  83 PR Interval:    QRS Duration: 131 QT Interval:  412 QTC Calculation: 484 R Axis:   81 Text Interpretation:   Atrial fibrillation IVCD, consider atypical RBBB  Anteroseptal infarct, age indeterminate no significant change since Sept 9  2016 Confirmed by Regenia Skeeter  MD, Waldo ([id number]) on 12/15/2014 9:33:11 AM      MDM   Final diagnoses:  Colonic mass  Diarrhea    Patient has not had any diarrhea while in the ER. She has mild left lower quadrant pain, CT scan obtained again given recent history of possible  diverticulitis. After discussion with the radiologist this appears to be more of a mass rather than diverticulitis. Was present in April 2016. Discussed with Dr. Paulita Fujita of gastroenterology who recommends colonoscopy as an outpatient. When I discussed this with the patient, however, she states that if this is cancer this might be how she dies. She absolutely does not want a colonoscopy. She understands she could die if this colon cancer progresses. She also understands that she has a renal mass concerning for carcinoma. She states that she is 65 and has lived a long life and if this is cancer then so be it and she does not want treatment or further evaluation of this. I feel this is very reasonable. She is otherwise well-appearing besides hypertensive. I discussed the importance of following up with her PCP as close as possible on an outpatient basis. Her diarrhea is most likely related to the mass causing her to only have small stools come out. There is some concern for C. difficile but she has not had any stools here for testing and thus is probably less likely. There is also no colitis seen on her CT scan. Discussed strict return precautions.    Sherwood Gambler, MD 12/15/14 [phone]

## 2014-12-15 NOTE — ED Notes (Signed)
Pt from home via [facility] with c/o diarrhea x 2 weeks with bathroom use approx 4 times a day with lower abdominal and lower back pain.  Pt reports this has been going on arppox 2 weeks and after she spoke with her PCP yesterday, she decided to call EMS today.  Pt in NAD, A&O.

## 2014-12-15 NOTE — ED Notes (Addendum)
Pt repeatedly stated she was urinating in the brief that was in place.  This RN and nursing student checked the brief multiple times and reassured the patient it was dry upon discharge.  Pt daughter notified that pt is ready to be picked up.

## 2014-12-15 NOTE — ED Notes (Signed)
Patient transported to CT 

## 2015-04-25 ENCOUNTER — Other Ambulatory Visit: Payer: Self-pay | Admitting: Cardiovascular Disease

## 2015-06-10 ENCOUNTER — Encounter (HOSPITAL_COMMUNITY): Payer: Self-pay | Admitting: Emergency Medicine

## 2015-06-10 ENCOUNTER — Inpatient Hospital Stay (HOSPITAL_COMMUNITY)
Admission: EM | Admit: 2015-06-10 | Discharge: 2015-06-25 | DRG: 329 | Disposition: A | Discharge status: Skilled Nursing Facility | Payer: Medicare Other | Attending: General Surgery | Admitting: General Surgery

## 2015-06-10 ENCOUNTER — Emergency Department (HOSPITAL_COMMUNITY): Payer: Medicare Other

## 2015-06-10 DIAGNOSIS — M545 Low back pain: Secondary | ICD-10-CM | POA: Diagnosis present

## 2015-06-10 DIAGNOSIS — I482 Chronic atrial fibrillation: Secondary | ICD-10-CM | POA: Diagnosis present

## 2015-06-10 DIAGNOSIS — F411 Generalized anxiety disorder: Secondary | ICD-10-CM | POA: Diagnosis not present

## 2015-06-10 DIAGNOSIS — F329 Major depressive disorder, single episode, unspecified: Secondary | ICD-10-CM | POA: Diagnosis present

## 2015-06-10 DIAGNOSIS — J438 Other emphysema: Secondary | ICD-10-CM | POA: Diagnosis not present

## 2015-06-10 DIAGNOSIS — F0280 Dementia in other diseases classified elsewhere without behavioral disturbance: Secondary | ICD-10-CM | POA: Diagnosis present

## 2015-06-10 DIAGNOSIS — Z79899 Other long term (current) drug therapy: Secondary | ICD-10-CM

## 2015-06-10 DIAGNOSIS — G9341 Metabolic encephalopathy: Secondary | ICD-10-CM | POA: Diagnosis present

## 2015-06-10 DIAGNOSIS — Z881 Allergy status to other antibiotic agents status: Secondary | ICD-10-CM

## 2015-06-10 DIAGNOSIS — D62 Acute posthemorrhagic anemia: Secondary | ICD-10-CM | POA: Diagnosis present

## 2015-06-10 DIAGNOSIS — B9620 Unspecified Escherichia coli [E. coli] as the cause of diseases classified elsewhere: Secondary | ICD-10-CM | POA: Diagnosis present

## 2015-06-10 DIAGNOSIS — I4510 Unspecified right bundle-branch block: Secondary | ICD-10-CM | POA: Diagnosis present

## 2015-06-10 DIAGNOSIS — G8929 Other chronic pain: Secondary | ICD-10-CM | POA: Diagnosis present

## 2015-06-10 DIAGNOSIS — E039 Hypothyroidism, unspecified: Secondary | ICD-10-CM | POA: Diagnosis present

## 2015-06-10 DIAGNOSIS — Z7901 Long term (current) use of anticoagulants: Secondary | ICD-10-CM

## 2015-06-10 DIAGNOSIS — I480 Paroxysmal atrial fibrillation: Secondary | ICD-10-CM | POA: Diagnosis present

## 2015-06-10 DIAGNOSIS — I129 Hypertensive chronic kidney disease with stage 1 through stage 4 chronic kidney disease, or unspecified chronic kidney disease: Secondary | ICD-10-CM | POA: Diagnosis present

## 2015-06-10 DIAGNOSIS — I872 Venous insufficiency (chronic) (peripheral): Secondary | ICD-10-CM | POA: Diagnosis present

## 2015-06-10 DIAGNOSIS — C7801 Secondary malignant neoplasm of right lung: Secondary | ICD-10-CM | POA: Diagnosis present

## 2015-06-10 DIAGNOSIS — C187 Malignant neoplasm of sigmoid colon: Principal | ICD-10-CM | POA: Diagnosis present

## 2015-06-10 DIAGNOSIS — J449 Chronic obstructive pulmonary disease, unspecified: Secondary | ICD-10-CM | POA: Diagnosis present

## 2015-06-10 DIAGNOSIS — K621 Rectal polyp: Secondary | ICD-10-CM | POA: Diagnosis present

## 2015-06-10 DIAGNOSIS — E43 Unspecified severe protein-calorie malnutrition: Secondary | ICD-10-CM | POA: Diagnosis present

## 2015-06-10 DIAGNOSIS — N390 Urinary tract infection, site not specified: Secondary | ICD-10-CM | POA: Diagnosis present

## 2015-06-10 DIAGNOSIS — Z8042 Family history of malignant neoplasm of prostate: Secondary | ICD-10-CM

## 2015-06-10 DIAGNOSIS — Z961 Presence of intraocular lens: Secondary | ICD-10-CM | POA: Diagnosis present

## 2015-06-10 DIAGNOSIS — K6389 Other specified diseases of intestine: Secondary | ICD-10-CM

## 2015-06-10 DIAGNOSIS — Z9114 Patient's other noncompliance with medication regimen: Secondary | ICD-10-CM

## 2015-06-10 DIAGNOSIS — Z87891 Personal history of nicotine dependence: Secondary | ICD-10-CM

## 2015-06-10 DIAGNOSIS — M549 Dorsalgia, unspecified: Secondary | ICD-10-CM

## 2015-06-10 DIAGNOSIS — E038 Other specified hypothyroidism: Secondary | ICD-10-CM | POA: Diagnosis not present

## 2015-06-10 DIAGNOSIS — K5660 Unspecified intestinal obstruction: Secondary | ICD-10-CM | POA: Diagnosis not present

## 2015-06-10 DIAGNOSIS — N189 Chronic kidney disease, unspecified: Secondary | ICD-10-CM | POA: Diagnosis present

## 2015-06-10 DIAGNOSIS — Z9842 Cataract extraction status, left eye: Secondary | ICD-10-CM

## 2015-06-10 DIAGNOSIS — M810 Age-related osteoporosis without current pathological fracture: Secondary | ICD-10-CM | POA: Diagnosis present

## 2015-06-10 DIAGNOSIS — Z01810 Encounter for preprocedural cardiovascular examination: Secondary | ICD-10-CM | POA: Diagnosis not present

## 2015-06-10 DIAGNOSIS — K921 Melena: Secondary | ICD-10-CM | POA: Diagnosis present

## 2015-06-10 DIAGNOSIS — Z9841 Cataract extraction status, right eye: Secondary | ICD-10-CM

## 2015-06-10 DIAGNOSIS — K56609 Unspecified intestinal obstruction, unspecified as to partial versus complete obstruction: Secondary | ICD-10-CM

## 2015-06-10 DIAGNOSIS — Z8711 Personal history of peptic ulcer disease: Secondary | ICD-10-CM | POA: Diagnosis not present

## 2015-06-10 DIAGNOSIS — K922 Gastrointestinal hemorrhage, unspecified: Secondary | ICD-10-CM | POA: Diagnosis not present

## 2015-06-10 DIAGNOSIS — I10 Essential (primary) hypertension: Secondary | ICD-10-CM | POA: Diagnosis not present

## 2015-06-10 DIAGNOSIS — G309 Alzheimer's disease, unspecified: Secondary | ICD-10-CM | POA: Diagnosis present

## 2015-06-10 DIAGNOSIS — Z9049 Acquired absence of other specified parts of digestive tract: Secondary | ICD-10-CM

## 2015-06-10 HISTORY — DX: Respiratory failure, unspecified, unspecified whether with hypoxia or hypercapnia: J96.90

## 2015-06-10 HISTORY — DX: Reserved for inherently not codable concepts without codable children: IMO0001

## 2015-06-10 LAB — COMPREHENSIVE METABOLIC PANEL
ALBUMIN: 3.6 g/dL (ref 3.5–5.0)
ALT: 16 U/L (ref 14–54)
ANION GAP: 10 (ref 5–15)
AST: 25 U/L (ref 15–41)
Alkaline Phosphatase: 68 U/L (ref 38–126)
BILIRUBIN TOTAL: 0.7 mg/dL (ref 0.3–1.2)
BUN: 15 mg/dL (ref 6–20)
CHLORIDE: 99 mmol/L — AB (ref 101–111)
CO2: 27 mmol/L (ref 22–32)
Calcium: 9.1 mg/dL (ref 8.9–10.3)
Creatinine, Ser: 0.69 mg/dL (ref 0.44–1.00)
GFR calc Af Amer: >60 mL/min (ref >60)
GFR calc non Af Amer: >60 mL/min (ref >60)
GLUCOSE: 98 mg/dL (ref 65–99)
POTASSIUM: 4 mmol/L (ref 3.5–5.1)
SODIUM: 136 mmol/L (ref 135–145)
TOTAL PROTEIN: 6.9 g/dL (ref 6.5–8.1)

## 2015-06-10 LAB — CBC WITH DIFFERENTIAL/PLATELET
BASOS ABS: 0 10*3/uL (ref 0.0–0.1)
BASOS PCT: 0 %
Basophils Absolute: 0 10*3/uL (ref 0.0–0.1)
Basophils Relative: 0 %
EOS ABS: 0.1 10*3/uL (ref 0.0–0.7)
Eosinophils Absolute: 0 10*3/uL (ref 0.0–0.7)
Eosinophils Relative: 1 %
Eosinophils Relative: 0 %
HCT: 29 % — ABNORMAL LOW (ref 36.0–46.0)
HEMATOCRIT: 29.8 % — AB (ref 36.0–46.0)
HEMOGLOBIN: 9.1 g/dL — AB (ref 12.0–15.0)
Hemoglobin: 9.7 g/dL — ABNORMAL LOW (ref 12.0–15.0)
LYMPHS ABS: 2.3 10*3/uL (ref 0.7–4.0)
LYMPHS ABS: 2.1 10*3/uL (ref 0.7–4.0)
LYMPHS PCT: 23 %
Lymphocytes Relative: 19 %
MCH: 25.9 pg — ABNORMAL LOW (ref 26.0–34.0)
MCH: 25 pg — AB (ref 26.0–34.0)
MCHC: 32.6 g/dL (ref 30.0–36.0)
MCHC: 31.4 g/dL (ref 30.0–36.0)
MCV: 79.5 fL (ref 78.0–100.0)
MCV: 79.7 fL (ref 78.0–100.0)
MONO ABS: 1 10*3/uL (ref 0.1–1.0)
MONOS PCT: 10 %
Monocytes Absolute: 1 10*3/uL (ref 0.1–1.0)
Monocytes Relative: 10 %
NEUTROS ABS: 6.4 10*3/uL (ref 1.7–7.7)
NEUTROS ABS: 7.6 10*3/uL (ref 1.7–7.7)
NEUTROS PCT: 66 %
NEUTROS PCT: 71 %
Platelets: 458 10*3/uL — ABNORMAL HIGH (ref 150–400)
Platelets: 499 10*3/uL — ABNORMAL HIGH (ref 150–400)
RBC: 3.75 MIL/uL — ABNORMAL LOW (ref 3.87–5.11)
RBC: 3.64 MIL/uL — ABNORMAL LOW (ref 3.87–5.11)
RDW: 15.2 % (ref 11.5–15.5)
RDW: 15.3 % (ref 11.5–15.5)
WBC: 9.8 10*3/uL (ref 4.0–10.5)
WBC: 10.7 10*3/uL — ABNORMAL HIGH (ref 4.0–10.5)

## 2015-06-10 LAB — I-STAT TROPONIN, ED: Troponin i, poc: 0.01 ng/mL (ref 0.00–0.08)

## 2015-06-10 LAB — URINE MICROSCOPIC-ADD ON

## 2015-06-10 LAB — URINALYSIS, ROUTINE W REFLEX MICROSCOPIC
GLUCOSE, UA: NEGATIVE mg/dL
Ketones, ur: 15 mg/dL — AB
Nitrite: POSITIVE — AB
PH: 5.5 (ref 5.0–8.0)
Protein, ur: NEGATIVE mg/dL
Specific Gravity, Urine: 1.023 (ref 1.005–1.030)

## 2015-06-10 LAB — I-STAT CG4 LACTIC ACID, ED: LACTIC ACID, VENOUS: 0.99 mmol/L (ref 0.5–2.0)

## 2015-06-10 LAB — PROTIME-INR
INR: 1.4 (ref 0.00–1.49)
Prothrombin Time: 17.3 seconds — ABNORMAL HIGH (ref 11.6–15.2)

## 2015-06-10 LAB — LIPASE, BLOOD: Lipase: 38 U/L (ref 11–51)

## 2015-06-10 LAB — POC OCCULT BLOOD, ED: Fecal Occult Bld: POSITIVE — AB

## 2015-06-10 MED ORDER — PANTOPRAZOLE SODIUM 40 MG IV SOLR
40.0000 mg | Freq: Two times a day (BID) | INTRAVENOUS | Status: DC
  Administered 2015-06-10 – 2015-06-17 (×14): 40 mg via INTRAVENOUS
  Filled 2015-06-10 (×15): qty 40

## 2015-06-10 MED ORDER — ALPRAZOLAM 0.25 MG PO TABS
0.2500 mg | ORAL_TABLET | Freq: Three times a day (TID) | ORAL | Status: DC | PRN
Start: 2015-06-10 — End: 2015-06-17
  Administered 2015-06-10 – 2015-06-17 (×14): 0.25 mg via ORAL
  Filled 2015-06-10 (×14): qty 1

## 2015-06-10 MED ORDER — ONDANSETRON HCL 4 MG/2ML IJ SOLN: 4.0000 mg | Freq: Four times a day (QID) | INTRAMUSCULAR | Status: DC | PRN

## 2015-06-10 MED ORDER — SODIUM CHLORIDE 0.9 % IV SOLN
INTRAVENOUS | Status: AC
  Administered 2015-06-11 – 2015-06-13 (×4): via INTRAVENOUS

## 2015-06-10 MED ORDER — SPIRONOLACTONE 25 MG PO TABS
25.0000 mg | ORAL_TABLET | Freq: Two times a day (BID) | ORAL | Status: DC
  Administered 2015-06-10 – 2015-06-17 (×13): 25 mg via ORAL
  Filled 2015-06-10 (×15): qty 1

## 2015-06-10 MED ORDER — ACETAMINOPHEN 325 MG PO TABS
650.0000 mg | ORAL_TABLET | Freq: Four times a day (QID) | ORAL | Status: DC | PRN
  Administered 2015-06-10 – 2015-06-16 (×8): 650 mg via ORAL
  Filled 2015-06-10 (×9): qty 2

## 2015-06-10 MED ORDER — ACETAMINOPHEN 650 MG RE SUPP: 650.0000 mg | Freq: Four times a day (QID) | RECTAL | Status: DC | PRN

## 2015-06-10 MED ORDER — SODIUM CHLORIDE 0.9 % IV SOLN
8.0000 mg/h | INTRAVENOUS | Status: DC
  Filled 2015-06-10: qty 80

## 2015-06-10 MED ORDER — BENAZEPRIL HCL 40 MG PO TABS
40.0000 mg | ORAL_TABLET | Freq: Every day | ORAL | Status: DC
  Administered 2015-06-10 – 2015-06-17 (×8): 40 mg via ORAL
  Filled 2015-06-10 (×8): qty 1

## 2015-06-10 MED ORDER — DULOXETINE HCL 30 MG PO CPEP: 30.0000 mg | ORAL_CAPSULE | Freq: Every day | ORAL | Status: DC

## 2015-06-10 MED ORDER — ONDANSETRON HCL 4 MG PO TABS: 4.0000 mg | ORAL_TABLET | Freq: Four times a day (QID) | ORAL | Status: DC | PRN

## 2015-06-10 MED ORDER — FAMOTIDINE IN NACL 20-0.9 MG/50ML-% IV SOLN
20.0000 mg | Freq: Once | INTRAVENOUS | Status: AC
  Administered 2015-06-10: 20 mg via INTRAVENOUS
  Filled 2015-06-10: qty 50

## 2015-06-10 MED ORDER — BISOPROLOL FUMARATE 5 MG PO TABS
5.0000 mg | ORAL_TABLET | Freq: Every day | ORAL | Status: DC
  Administered 2015-06-10 – 2015-06-17 (×8): 5 mg via ORAL
  Filled 2015-06-10 (×9): qty 1

## 2015-06-10 MED ORDER — SODIUM CHLORIDE 0.9 % IV SOLN
80.0000 mg | Freq: Once | INTRAVENOUS | Status: AC
  Administered 2015-06-10: 80 mg via INTRAVENOUS
  Filled 2015-06-10: qty 80

## 2015-06-10 MED ORDER — ONDANSETRON HCL 4 MG/2ML IJ SOLN
4.0000 mg | Freq: Once | INTRAMUSCULAR | Status: AC
  Administered 2015-06-10: 4 mg via INTRAVENOUS
  Filled 2015-06-10: qty 2

## 2015-06-10 MED ORDER — IMIPRAMINE HCL 10 MG PO TABS
30.0000 mg | ORAL_TABLET | Freq: Every day | ORAL | Status: DC
  Administered 2015-06-10 – 2015-06-12 (×3): 30 mg via ORAL
  Filled 2015-06-10 (×4): qty 3

## 2015-06-10 MED ORDER — MORPHINE SULFATE (PF) 4 MG/ML IV SOLN
4.0000 mg | Freq: Once | INTRAVENOUS | Status: AC
  Administered 2015-06-10: 4 mg via INTRAVENOUS
  Filled 2015-06-10: qty 1

## 2015-06-10 MED ORDER — LEVOTHYROXINE SODIUM 88 MCG PO TABS: 88.0000 ug | ORAL_TABLET | Freq: Every day | ORAL | Status: DC

## 2015-06-10 NOTE — ED Notes (Signed)
Bed: [id number] Expected date:  Expected time:  Means of arrival:  Comments: EMS- 82yo F, generalized abdominal pain/constipation

## 2015-06-10 NOTE — Consult Note (Signed)
Referring Provider: Dr. Leisa Lenz Primary Care Physician:  Marton Redwood, MD Primary Gastroenterologist:  Dr. Watt Climes  Reason for Consultation:  Rectal bleeding  HPI: Natalie [name] is a 82 y.o. female being admitted through the ER b/o GI bleeding.  There is a 1-2 wk h/o reportedly melenic stools.  Although the patient's record states she is on coumadin and Eloquis, by the time she was seen in the ER today, she had a nl INR of 1.4; her BUN was nl, and her hgb of 9.7 was approximately 1-2 gm lower than her baseline from 6 months ago.  The pt states she's had diffuse abdominal pain for 1-2 years w/out any recent exacerbation.  When in the hospital 6 mos ago for similar rectal bleeding, the pt declined colonoscopic evaluation.  There was asymmetric sigmoid thickening on her CT at that time, raising at least the possibility of a mass; however, she is known to have some element of sigmoid diverticulosis based on prior CT scanning.  Exam by the ED PA showed "dark stool mixed with red blood."    Our office record has a copy of a colonoscopy rept from 11/2003 by Dr. Carol Ada (essent nl, exc sm rect polyp).  There was a plan for a repeat colonoscopy by Dr. Watt Climes in 2010 b/o ongoing abdominal pain, but the procedure got cancelled, apparently b/o a death in the family, and never ended up getting performed.   Past Medical History  Diagnosis Date  . Osteoporosis   . Hypertension   . Hypothyroidism   . Lower GI bleeding 07/21/11  . Atrial fibrillation ([place])   . COPD (chronic obstructive pulmonary disease) ([place])   . Pneumonia   . Shortness of breath 07/21/11    "anytime"  . Sinus headache 07/21/11    "all the time"  . Bladder infection, chronic   . Arthritis     "everywhere"  . Anxiety   . Chronic lower back pain   . Chronic abdominal pain   . Renal mass, left 2013  . PUD (peptic ulcer disease)   . Chronic cystitis   . Trigeminal neuralgia     /E-chart  . Chronic kidney disease   .  Anemia     Past Surgical History  Procedure Laterality Date  . Vaginal hysterectomy      partial  . Cataract extraction w/ intraocular lens  implant, bilateral    . Percutaneous pinning femoral neck fracture  11/2003    w/closed reduction ; left/E-chart  . Laceration repair  11/2003    left eyebrow  . Bilateral salpingoophorectomy  12/2005    lap/E-chart  . Cholecystectomy  1960  . Appendectomy      presumed/E-chart  . Esophagogastroduodenoscopy  07/23/2011    Procedure: ESOPHAGOGASTRODUODENOSCOPY (EGD);  Surgeon: Wonda Horner, MD;  Location: [facility] ENDOSCOPY;  Service: Endoscopy;  Laterality: N/A;    Prior to Admission medications   Medication Sig Start Date End Date Taking? Authorizing Provider  ALPRAZolam (XANAX) 0.25 MG tablet Take 0.25 mg by mouth every 8 (eight) hours as needed for anxiety.    Yes Historical Provider, MD  apixaban (ELIQUIS) 5 MG TABS tablet Take 5 mg by mouth 2 (two) times daily.   Yes Historical Provider, MD  benazepril (LOTENSIN) 40 MG tablet TAKE 1 TABLET BY MOUTH EVERY DAY *MUST KEEP 04-05-14 APPOINTMENT* Patient taking differently: Take 40 mg by mouth daily. TAKE 1 TABLET BY MOUTH EVERY DAY *MUST KEEP 04-05-14 APPOINTMENT* 04/20/14  Yes Sanda Klein, MD  bisoprolol (ZEBETA) 5 MG tablet Take 5 mg by mouth daily.   Yes Historical Provider, MD  cloNIDine (CATAPRES) 0.1 MG tablet Take 0.1 mg by mouth daily. Reported on 06/10/2015   Yes Historical Provider, MD  imipramine (TOFRANIL) 10 MG tablet Take 30 mg by mouth at bedtime. 07/11/14  Yes Historical Provider, MD  lansoprazole (PREVACID) 30 MG capsule Take 30 mg by mouth daily at 12 noon.   Yes Historical Provider, MD  spironolactone (ALDACTONE) 25 MG tablet Take 1 tablet (25 mg total) by mouth 2 (two) times daily. 12/03/14  Yes Oswald Hillock, MD  traMADol (ULTRAM) 50 MG tablet Take 50 mg by mouth every 8 (eight) hours as needed (pain.). for pain 04/26/14  Yes Historical Provider, MD  amoxicillin-clavulanate (AUGMENTIN)  875-125 MG per tablet Take 1 tablet by mouth 2 (two) times daily. Patient not taking: Reported on 06/10/2015 12/03/14   Oswald Hillock, MD  bisoprolol (ZEBETA) 10 MG tablet Take 1 tablet (10 mg total) by mouth daily. Patient not taking: Reported on 06/10/2015 12/04/14   Oswald Hillock, MD  warfarin (COUMADIN) 2.5 MG tablet Take 1 to 1.5 tablets by mouth daily as directed by coumadin clinic.  Needs INR for further refills Patient not taking: Reported on 06/10/2015 12/08/13   Sanda Klein, MD    Current Facility-Administered Medications  Medication Dose Route Frequency Provider Last Rate Last Dose  . ALPRAZolam Duanne Moron) tablet 0.25 mg  0.25 mg Oral Q8H PRN Robbie Lis, MD      . benazepril (LOTENSIN) tablet 40 mg  40 mg Oral Daily Robbie Lis, MD      . bisoprolol (ZEBETA) tablet 5 mg  5 mg Oral Daily Robbie Lis, MD      . imipramine (TOFRANIL) tablet 30 mg  30 mg Oral QHS Robbie Lis, MD      . pantoprazole (PROTONIX) injection 40 mg  40 mg Intravenous Q12H Robbie Lis, MD   40 mg at 06/10/15 1349  . spironolactone (ALDACTONE) tablet 25 mg  25 mg Oral BID Robbie Lis, MD       Current Outpatient Prescriptions  Medication Sig Dispense Refill  . ALPRAZolam (XANAX) 0.25 MG tablet Take 0.25 mg by mouth every 8 (eight) hours as needed for anxiety.     Marland Kitchen apixaban (ELIQUIS) 5 MG TABS tablet Take 5 mg by mouth 2 (two) times daily.    . benazepril (LOTENSIN) 40 MG tablet TAKE 1 TABLET BY MOUTH EVERY DAY *MUST KEEP 04-05-14 APPOINTMENT* (Patient taking differently: Take 40 mg by mouth daily. TAKE 1 TABLET BY MOUTH EVERY DAY *MUST KEEP 04-05-14 APPOINTMENT*) 30 tablet 10  . bisoprolol (ZEBETA) 5 MG tablet Take 5 mg by mouth daily.    . cloNIDine (CATAPRES) 0.1 MG tablet Take 0.1 mg by mouth daily. Reported on 06/10/2015    . imipramine (TOFRANIL) 10 MG tablet Take 30 mg by mouth at bedtime.    . lansoprazole (PREVACID) 30 MG capsule Take 30 mg by mouth daily at 12 noon.    Marland Kitchen spironolactone (ALDACTONE)  25 MG tablet Take 1 tablet (25 mg total) by mouth 2 (two) times daily. 60 tablet 2  . traMADol (ULTRAM) 50 MG tablet Take 50 mg by mouth every 8 (eight) hours as needed (pain.). for pain  1  . amoxicillin-clavulanate (AUGMENTIN) 875-125 MG per tablet Take 1 tablet by mouth 2 (two) times daily. (Patient not taking: Reported on 06/10/2015) 14 tablet 0  . bisoprolol (ZEBETA)  10 MG tablet Take 1 tablet (10 mg total) by mouth daily. (Patient not taking: Reported on 06/10/2015) 30 tablet 2  . warfarin (COUMADIN) 2.5 MG tablet Take 1 to 1.5 tablets by mouth daily as directed by coumadin clinic.  Needs INR for further refills (Patient not taking: Reported on 06/10/2015) 35 tablet 0    Allergies as of 06/10/2015 - Review Complete 06/10/2015  Allergen Reaction Noted  . Levofloxacin Other (See Comments)     Family History  Problem Relation Age of Onset  . Prostate cancer Father     Patient states that his father probably had prostate cancer    Social History   Social History  . Marital Status: Widowed    Spouse Name: N/A  . Number of Children: N/A  . Years of Education: N/A   Occupational History  . Not on file.   Social History Main Topics  . Smoking status: Former Smoker -- 1.00 packs/day    Types: Cigarettes    Quit date: 09/11/2009  . Smokeless tobacco: Never Used     Comment: 07/21/11 "can't remember when I quit smoking"  . Alcohol Use: 1.2 oz/week    2 Glasses of wine per week     Comment: 07/21/11 "seldom drink"  . Drug Use: No  . Sexual Activity: No   Other Topics Concern  . Not on file   Social History Narrative    Review of Systems:  Abd pn as per HPI.  Lives w/ dtr, ambulatory, no longer drives, but independent in ADL's.  Physical Exam: Vital signs in last 24 hours: Temp:  [98 F (36.7 C)-98.3 F (36.8 C)] 98.3 F (36.8 C) (03/20 1632) Pulse Rate:  [65-91] 78 (03/20 1632) Resp:  [15-28] 23 (03/20 1632) BP: (129-164)/(80-102) 129/89 mmHg (03/20 1632) SpO2:  [94  %-100 %] 96 % (03/20 1632)     Lying in bed, NAD, coherent, slightly frustrated w/ monitors beeping. No pallor or icterus. Chest clr anteriorly. Heart currently w/out overt arrhythmia; no murmur. Abd diffusely slightly firm and tender, but difficult to assess; I get the impression it is a benign exam, but with some degree of patient "touchiness."  Certainly no rigidity or rebound to indicate peritoneal findings. Rectal not repeated--see above description of stool.   Intake/Output from previous day:   Intake/Output this shift:    Lab Results:  Recent Labs  06/10/15 1131  WBC 9.8  HGB 9.7*  HCT 29.8*  PLT 458*   BMET  Recent Labs  06/10/15 1131  NA 136  K 4.0  CL 99*  CO2 27  GLUCOSE 98  BUN 15  CREATININE 0.69  CALCIUM 9.1   LFT  Recent Labs  06/10/15 1131  PROT 6.9  ALBUMIN 3.6  AST 25  ALT 16  ALKPHOS 68  BILITOT 0.7   PT/INR  Recent Labs  06/10/15 1131  LABPROT 17.3*  INR 1.40    Studies/Results: Dg Chest Port 1 View  06/10/2015  CLINICAL DATA:  Generalized abdominal pain, shortness of breath EXAM: PORTABLE CHEST 1 VIEW COMPARISON:  08/25/2011 FINDINGS: Cardiomediastinal silhouette is stable. Mild elevation of the left hemidiaphragm again noted. Stable left basilar scarring. No infiltrate or pulmonary edema. IMPRESSION: No active disease.  Stable left basilar scarring. Electronically Signed   By: Lahoma Crocker M.D.   On: 06/10/2015 13:26    Impression: 1. GI bleeding of indeterminate origin--probably lower, based on nl BUN and red blood in stool.  Could be diverticular (exacerbated by anticoagulation, although INR  nl today) or vascular ectasia, although the possibility of a mass has to be considered given the Sept. CT appearance of the sigmoid. 2. Abnl CT appearance of the sigmoid--? Mass. 3. Post-hemorrhagic anemia--acute-on-chronic 4. Chronic anticoagulation--some unclarity re. Which agent(s) 5.  Longstanding nonspecific abd pn ?  etiol  Plan: 1.  Pt is grossly stable--no need for emergent intervention 2.  Pt ambivalent about colonoscopy.  Will discuss further w/ her tomorrow; will change to clr liq tomorrow morning in case we decide to proceed w/ prep for exam. 3.  Would favor discussion w/ pt's PCP/cardiologist regarding the appropriateness of ongoing anticoagulation, esp if she declines GI w/u, or if a workup is performed but does not show a remediable cause for the bleeding. 4. Have ordered daily CBC--don't think pt is bleeding actively enough to necessitate more frequent monitoring at present.   LOS: 0 days   Cleotis Nipper  06/10/2015, 6:34 PM   Pager [phone] If no answer or after 5 PM call [phone]

## 2015-06-10 NOTE — H&P (Addendum)
[facility] History and Physical  Natalie [name] [id number] DOB: 1930/09/03 DOA: 06/10/2015  Referring physician: ER PA: Monico Blitz PCP: Marton Redwood, MD  Chief Complaint: shortness of breath, melanotic stool   HPI:  82 -year-old female with past medical history significant for atrial fibrillation on anticoagulation with Coumadin, hypertension, depression, hypothyroidism who presented to [facility] for evaluation of ongoing shortness of breath, weakness and reports of melanotic stool for last 1-2 weeks prior to this admission. She reported no gross blood per rectum. No reports of abdominal pain, nausea or vomiting. She did report intermittent episodes of diarrhea. No reports of lightheadedness or loss of consciousness. No reports of chest pain or palpitations. No fevers or cough or chills.  In ED, patient was hemodynamically stable, afebrile and blood pressure 145/80. Blood work was significant for hemoglobin of 9.7, platelets 458, normal creatinine. INR was 1.40. Chest x-ray showed no acute cardiopulmonary process. Patient does not want any diagnostic procedures. She does not even want repeat CAT scan at this time. She is agreeable to observation for next 24 hours.  Assessment & Plan    Active Problems: Acute upper GI bleed - Likely triggered by Coumadin which we placed on hold at the time of the admission - Hemoglobin is 9.7. She currently does not require transfusion - Start Protonix 40 mg IV every 12 hours. She was given bolus Protonix 80 mg IV in ED. - Follow-up CBC tomorrow morning  - Continue supportive care with IV fluids  - Appreciate the GI consult and recommendations although patient does not want any diagnostic procedures   Atrial fibrillation - CHADS vasc score 3 - Coumadin on hold due to risk of bleeding - Rate controlled with bisoprolol  Essential hypertension - Continue Lotensin, Aldactone, bisoprolol. Questionable if she is taking  clonidine. For right now will hold clonidine because blood pressure is stable and if it does remain stable in next 24 hours perhaps these are the only medications she will need on discharge. - Considering her age it is reasonable to have goal blood pressure 150/90  Hypothyroidism - Continue Synthroid   DVT prophylaxis:  - SCDs due to risk of bleeding  Radiological Exams on Admission: Dg Chest Port 1 View 06/10/2015  No active disease.  Stable left basilar scarring. Electronically Signed   By: Lahoma Crocker M.D.   On: 06/10/2015 13:26    EKG: I have personally reviewed EKG. EKG shows atrial fibrillation  Code Status: Full Family Communication: Plan of care discussed with the patient  Disposition Plan: Admit for further evaluation, medical floor   Leisa Lenz, MD  [facility] Pager [phone]  Time spent in minutes: 55 minutes  Review of Systems:  Constitutional: Negative for fever, chills and malaise/fatigue. Negative for diaphoresis.  HENT: Negative for hearing loss, ear pain, nosebleeds, congestion, sore throat, neck pain, tinnitus and ear discharge.   Eyes: Negative for blurred vision, double vision, photophobia, pain, discharge and redness.  Respiratory: Negative for cough, hemoptysis, sputum production, wheezing and stridor.   Cardiovascular: Negative for chest pain, palpitations, orthopnea, claudication and leg swelling.  Gastrointestinal: Negative for nausea, vomiting and abdominal pain. Negative for heartburn, constipation, positive for maroon color stool  Genitourinary: Negative for dysuria, urgency, frequency, hematuria and flank pain.  Musculoskeletal: Negative for myalgias, back pain, joint pain and falls.  Skin: Negative for itching and rash.  Neurological: Negative for dizziness and weakness. Negative for tingling, tremors, sensory change, speech change, focal weakness, loss of consciousness and headaches.  Endo/Heme/Allergies: Negative for environmental allergies  and polydipsia. Does not bruise/bleed easily.  Psychiatric/Behavioral: Negative for suicidal ideas. The patient is not nervous/anxious.      Past Medical History  Diagnosis Date  . Osteoporosis   . Hypertension   . Hypothyroidism   . Lower GI bleeding 07/21/11  . Atrial fibrillation ([place])   . COPD (chronic obstructive pulmonary disease) ([place])   . Pneumonia   . Shortness of breath 07/21/11    "anytime"  . Sinus headache 07/21/11    "all the time"  . Bladder infection, chronic   . Arthritis     "everywhere"  . Anxiety   . Chronic lower back pain   . Chronic abdominal pain   . Renal mass, left 2013  . PUD (peptic ulcer disease)   . Chronic cystitis   . Trigeminal neuralgia     /E-chart  . Chronic kidney disease   . Anemia    Past Surgical History  Procedure Laterality Date  . Vaginal hysterectomy      partial  . Cataract extraction w/ intraocular lens  implant, bilateral    . Percutaneous pinning femoral neck fracture  11/2003    w/closed reduction ; left/E-chart  . Laceration repair  11/2003    left eyebrow  . Bilateral salpingoophorectomy  12/2005    lap/E-chart  . Cholecystectomy  1960  . Appendectomy      presumed/E-chart  . Esophagogastroduodenoscopy  07/23/2011    Procedure: ESOPHAGOGASTRODUODENOSCOPY (EGD);  Surgeon: Wonda Horner, MD;  Location: [facility] ENDOSCOPY;  Service: Endoscopy;  Laterality: N/A;   Social History:  reports that she quit smoking about 5 years ago. Her smoking use included Cigarettes. She smoked 1.00 pack per day. She has never used smokeless tobacco. She reports that she drinks about 1.2 oz of alcohol per week. She reports that she does not use illicit drugs.  Allergies  Allergen Reactions  . Levofloxacin Other (See Comments)    REACTION: unspecified per [facility]    Family History:  Family History  Problem Relation Age of Onset  . Prostate cancer Father     Patient states that his father probably had prostate cancer     Prior to Admission  medications   Medication Sig Start Date End Date Taking? Authorizing Provider  ALPRAZolam (XANAX) 0.25 MG tablet Take 0.25 mg by mouth every 8 (eight) hours as needed for anxiety.     Historical Provider, MD  amoxicillin-clavulanate (AUGMENTIN) 875-125 MG per tablet Take 1 tablet by mouth 2 (two) times daily. 12/03/14   Oswald Hillock, MD  benazepril (LOTENSIN) 40 MG tablet TAKE 1 TABLET BY MOUTH EVERY DAY *MUST KEEP 04-05-14 APPOINTMENT* Patient taking differently: Take 40 mg by mouth daily. TAKE 1 TABLET BY MOUTH EVERY DAY *MUST KEEP 04-05-14 APPOINTMENT* 04/20/14   Mihai Croitoru, MD  bisoprolol (ZEBETA) 10 MG tablet Take 1 tablet (10 mg total) by mouth daily. 12/04/14   Oswald Hillock, MD  cloNIDine (CATAPRES - DOSED IN MG/24 HR) 0.1 mg/24hr patch Place 1 patch onto the skin once a week. 11/21/14   Historical Provider, MD  dicyclomine (BENTYL) 10 MG capsule Take 10 mg by mouth 4 (four) times daily as needed for spasms.     Historical Provider, MD  imipramine (TOFRANIL) 10 MG tablet Take 30 mg by mouth at bedtime. 07/11/14   Historical Provider, MD  levothyroxine (SYNTHROID, LEVOTHROID) 88 MCG tablet Take 88 mcg by mouth daily before breakfast.    Historical Provider, MD  loperamide (IMODIUM)  2 MG capsule Take 2 mg by mouth as needed for diarrhea or loose stools.    Historical Provider, MD  spironolactone (ALDACTONE) 25 MG tablet Take 1 tablet (25 mg total) by mouth 2 (two) times daily. 12/03/14   Oswald Hillock, MD  traMADol (ULTRAM) 50 MG tablet Take 50 mg by mouth every 8 (eight) hours as needed. for pain 04/26/14   Historical Provider, MD  warfarin (COUMADIN) 2.5 MG tablet Take 1 to 1.5 tablets by mouth daily as directed by coumadin clinic.  Needs INR for further refills Patient taking differently: Take 2.5 mg by mouth daily at 6 PM.  12/08/13   Sanda Klein, MD   Physical Exam: Filed Vitals:   06/10/15 1020 06/10/15 1230  BP: 164/102 145/80  Pulse: 85   Temp: 98 F (36.7 C)   TempSrc: Oral   Resp:  15 20  SpO2: 100%     Physical Exam  Constitutional: Appears well-developed and well-nourished. No distress.  HENT: Normocephalic. No tonsillar erythema or exudates Eyes: Conjunctivae are normal. No scleral icterus.  Neck: Normal ROM. Neck supple. No JVD. No tracheal deviation. No thyromegaly.  CVS: Rate controlled, S1/S2 appreciated  Pulmonary: Effort and breath sounds normal, no stridor, rhonchi, wheezes, rales.  Abdominal: Soft. BS +,  no distension, tenderness, rebound or guarding.  Musculoskeletal: Normal range of motion. No edema and no tenderness.  Lymphadenopathy: No lymphadenopathy noted, cervical, inguinal. Neuro: Alert. Normal reflexes, muscle tone coordination. No focal neurologic deficits. Skin: Skin is warm and dry. No rash noted.  No erythema. No pallor.  Psychiatric: Normal mood and affect. Behavior, judgment, thought content normal.   Labs on Admission:  Basic Metabolic Panel:  Recent Labs Lab 06/10/15 1131  NA 136  K 4.0  CL 99*  CO2 27  GLUCOSE 98  BUN 15  CREATININE 0.69  CALCIUM 9.1   Liver Function Tests:  Recent Labs Lab 06/10/15 1131  AST 25  ALT 16  ALKPHOS 68  BILITOT 0.7  PROT 6.9  ALBUMIN 3.6    Recent Labs Lab 06/10/15 1131  LIPASE 38   No results for input(s): AMMONIA in the last 168 hours. CBC:  Recent Labs Lab 06/10/15 1131  WBC 9.8  NEUTROABS 6.4  HGB 9.7*  HCT 29.8*  MCV 79.5  PLT 458*   Cardiac Enzymes: No results for input(s): CKTOTAL, CKMB, CKMBINDEX, TROPONINI in the last 168 hours. BNP: Invalid input(s): POCBNP CBG: No results for input(s): GLUCAP in the last 168 hours.  If 7PM-7AM, please contact night-coverage [email or web address] Password [email or web address] 06/10/2015, 1:34 PM

## 2015-06-10 NOTE — ED Provider Notes (Signed)
CSN: [id number]     Arrival date & time 06/10/15  1012 History   First MD Initiated Contact with Patient 06/10/15 1033     Chief Complaint  Patient presents with  . Abdominal Pain     (Consider location/radiation/quality/duration/timing/severity/associated sxs/prior Treatment) HPI   Blood pressure 164/102, Natalie [name], temperature 98 F (36.7 C), temperature source Oral, resp. rate 15, SpO2 100 %.  Natalie Natalie [name] is a 82 y.o. female with past medical history significant for A. fib (chronically anticoagulated with Coumadin), COPD,  complaining of over the course of the last week with associated shortness of breath and melanotic stool now resolved, (day before yesterday) patient also has diarrhea. Of note patient was seen in September 2016, CAT scan at that time showed mass patient did not want to proceed forward with further workup including colonoscopy. Patient again states that she is aware of the risk and does not want to proceed forward with evaluation of the mass. She denies fever, chills, nausea, vomiting. She takes unknown pain medication prescribed by Natalie Natalie [name] which is not using her discomfort. Pain is colicky, not tied to eating.  GI: Natalie Natalie [name] PCP: Natalie Natalie [name]  Past Medical History  Diagnosis Date  . Osteoporosis   . Hypertension   . Hypothyroidism   . Lower GI bleeding 07/21/11  . Atrial fibrillation ([place])   . COPD (chronic obstructive pulmonary disease) ([place])   . Pneumonia   . Shortness of breath 07/21/11    "anytime"  . Sinus headache 07/21/11    "all the time"  . Bladder infection, chronic   . Arthritis     "everywhere"  . Anxiety   . Chronic lower back pain   . Chronic abdominal pain   . Renal mass, left 2013  . PUD (peptic ulcer disease)   . Chronic cystitis   . Trigeminal neuralgia     /E-chart  . Chronic kidney disease   . Anemia    Past Surgical History  Procedure Laterality Date  . Vaginal hysterectomy      partial  . Cataract extraction w/ intraocular lens   implant, bilateral    . Percutaneous pinning femoral neck fracture  11/2003    w/closed reduction ; left/E-chart  . Laceration repair  11/2003    left eyebrow  . Bilateral salpingoophorectomy  12/2005    lap/E-chart  . Cholecystectomy  1960  . Appendectomy      presumed/E-chart  . Esophagogastroduodenoscopy  07/23/2011    Procedure: ESOPHAGOGASTRODUODENOSCOPY (EGD);  Surgeon: Wonda Horner, MD;  Location: [facility] ENDOSCOPY;  Service: Endoscopy;  Laterality: N/A;   Family History  Problem Relation Age of Onset  . Prostate cancer Father     Patient states that his father probably had prostate cancer   Social History  Substance Use Topics  . Smoking status: Former Smoker -- 1.00 packs/day    Types: Cigarettes    Quit date: 09/11/2009  . Smokeless tobacco: Never Used     Comment: 07/21/11 "can't remember when I quit smoking"  . Alcohol Use: 1.2 oz/week    2 Glasses of wine per week     Comment: 07/21/11 "seldom drink"   OB History    No data available     Review of Systems  10 systems reviewed and found to be negative, except as noted in the HPI.   Allergies  Levofloxacin  Home Medications   Prior to Admission medications   Medication Sig Start Date End Date Taking? Authorizing Provider  ALPRAZolam Duanne Moron) 0.25  MG tablet Take 0.25 mg by mouth every 8 (eight) hours as needed for anxiety.     Historical Provider, MD  amoxicillin-clavulanate (AUGMENTIN) 875-125 MG per tablet Take 1 tablet by mouth 2 (two) times daily. 12/03/14   Oswald Hillock, MD  benazepril (LOTENSIN) 40 MG tablet TAKE 1 TABLET BY MOUTH EVERY DAY *MUST KEEP 04-05-14 APPOINTMENT* Patient taking differently: Take 40 mg by mouth daily. TAKE 1 TABLET BY MOUTH EVERY DAY *MUST KEEP 04-05-14 APPOINTMENT* 04/20/14   Mihai Croitoru, MD  bisoprolol (ZEBETA) 10 MG tablet Take 1 tablet (10 mg total) by mouth daily. 12/04/14   Oswald Hillock, MD  cloNIDine (CATAPRES - DOSED IN MG/24 HR) 0.1 mg/24hr patch Place 1 patch onto the skin once  a week. 11/21/14   Historical Provider, MD  dicyclomine (BENTYL) 10 MG capsule Take 10 mg by mouth 4 (four) times daily as needed for spasms.     Historical Provider, MD  imipramine (TOFRANIL) 10 MG tablet Take 30 mg by mouth at bedtime. 07/11/14   Historical Provider, MD  levothyroxine (SYNTHROID, LEVOTHROID) 88 MCG tablet Take 88 mcg by mouth daily before breakfast.    Historical Provider, MD  loperamide (IMODIUM) 2 MG capsule Take 2 mg by mouth as needed for diarrhea or loose stools.    Historical Provider, MD  spironolactone (ALDACTONE) 25 MG tablet Take 1 tablet (25 mg total) by mouth 2 (two) times daily. 12/03/14   Oswald Hillock, MD  traMADol (ULTRAM) 50 MG tablet Take 50 mg by mouth every 8 (eight) hours as needed. for pain 04/26/14   Historical Provider, MD  warfarin (COUMADIN) 2.5 MG tablet Take 1 to 1.5 tablets by mouth daily as directed by coumadin clinic.  Needs INR for further refills Patient taking differently: Take 2.5 mg by mouth daily at 6 PM.  12/08/13   Mihai Croitoru, MD   BP 164/102 mmHg  Natalie [name]  Temp(Src) 98 F (36.7 C) (Oral)  Resp 15  SpO2 100% Physical Exam  Constitutional: She is oriented to person, place, and time. She appears well-developed and well-nourished. No distress.  HOH  HENT:  Head: Normocephalic.  Ruddy conjunctiva  Eyes: Conjunctivae and EOM are normal.  Cardiovascular: Normal rate, regular rhythm and intact distal pulses.   Pulmonary/Chest: Effort normal and breath sounds normal. No stridor. No respiratory distress. She has no wheezes. She has no rales. She exhibits no tenderness.  Abdominal: Soft. Bowel sounds are normal. She exhibits no distension and no mass. There is tenderness. There is no rebound and no guarding.  Mild tenderness palpation of the Right upper quadrant guarding or rebound.  Genitourinary:  Digital rectal exam is chaperoned by nurse Natalie Natalie [name]: No rashes or lesions. Normal rectal tone, dark stool mixed with red blood.  Musculoskeletal:  Normal range of motion.  Neurological: She is alert and oriented to person, place, and time.  Psychiatric: She has a normal mood and affect.  Nursing note and vitals reviewed.   ED Course  Procedures (including critical care time) Labs Review Labs Reviewed  CBC WITH DIFFERENTIAL/PLATELET - Abnormal; Notable for the following:    RBC 3.75 (*)    Hemoglobin 9.7 (*)    HCT 29.8 (*)    MCH 25.9 (*)    Platelets 458 (*)    All other components within normal limits  COMPREHENSIVE METABOLIC PANEL - Abnormal; Notable for the following:    Chloride 99 (*)    All other components within normal limits  PROTIME-INR - Abnormal;  Notable for the following:    Prothrombin Time 17.3 (*)    All other components within normal limits  POC OCCULT BLOOD, ED - Abnormal; Notable for the following:    Fecal Occult Bld POSITIVE (*)    All other components within normal limits  LIPASE, BLOOD  URINALYSIS, ROUTINE W REFLEX MICROSCOPIC (NOT AT [facility])  I-STAT TROPOININ, ED  I-STAT CG4 LACTIC ACID, ED  SAMPLE TO BLOOD BANK    Imaging Review No results found. I have personally reviewed and evaluated these images and lab results as part of my medical decision-making.   EKG Interpretation   Date/Time:  Monday June 10 2015 11:39:37 EDT Ventricular Rate:  94 PR Interval:    QRS Duration: 132 QT Interval:  401 QTC Calculation: 501 R Axis:   77 Text Interpretation:  Atrial fibrillation Right bundle branch block  Confirmed by Natalie Chapel  MD, Natalie Natalie [name] [phone]) on 06/10/2015 1:08:00 PM      MDM   Final diagnoses:  Gastrointestinal hemorrhage, unspecified gastritis, unspecified gastrointestinal hemorrhage type  Chronic anticoagulation    Filed Vitals:   06/10/15 1020  BP: 164/102  Natalie [name]: 85  Temp: 98 F (36.7 C)  TempSrc: Oral  Resp: 15  SpO2: 100%    Medications  pantoprazole (PROTONIX) 80 mg in sodium chloride 0.9 % 250 mL (0.32 mg/mL) infusion (not administered)  pantoprazole (PROTONIX) 80  mg in sodium chloride 0.9 % 100 mL IVPB (not administered)  morphine 4 MG/ML injection 4 mg (4 mg Intravenous Given 06/10/15 1133)  ondansetron (ZOFRAN) injection 4 mg (4 mg Intravenous Given 06/10/15 1133)  famotidine (PEPCID) IVPB 20 mg premix (0 mg Intravenous Stopped 06/10/15 1205)    NAJAYAH KAMINER is 82 y.o. female presenting with GI bleed and abdominal pain. Stable vital signs. Of note, patient has history of diverticular disease, she is followed at Natalie Natalie [name]. She was seen 4 months ago and CT scan showed a likely mass. Patient reports that she does not want further workup for the mass has not followed on this, she reaffirms that she does not want treatment for a mass or hematology workup. Patient would agree to stay in the hospital for GI bleed. Bleed may be related to the mass versus diverticulosis.  INR is subtherapeutic at 1.4. Guaiac is positive. Patient with a hemoglobin of 9.7 and a crit of 29.8 (drop of greater than 2.6 in 5 months). BUN is not elevated. On my exam it is bright red blood, this is likely a lower GI bleed. She's a history of diverticulosis. Will discuss case with gastroenterologist Natalie Natalie [name]. Sample was sent to blood Bank. Will need admission for lower GI bleed.  Case discussed with gastric antral just Natalie Natalie [name] who will evaluate the patient. Natalie Natalie [name] accepts admission.   Monico Blitz, PA-C 06/10/15 [place], MD 06/10/15 1600

## 2015-06-10 NOTE — ED Notes (Signed)
Per EMS, patient is from home.  Patient is complaining of generalized pain all over her body.  Patient is A & O x 4.  She is ambulatory with a walker.  Patient hasn't taken blood pressure meds today.  BP: 175/114 HR:88 R:18

## 2015-06-10 NOTE — ED Notes (Signed)
Admitting MD at bedside.

## 2015-06-11 LAB — COMPREHENSIVE METABOLIC PANEL
ALT: 15 U/L (ref 14–54)
ANION GAP: 8 (ref 5–15)
AST: 18 U/L (ref 15–41)
Albumin: 3.2 g/dL — ABNORMAL LOW (ref 3.5–5.0)
Alkaline Phosphatase: 60 U/L (ref 38–126)
BUN: 15 mg/dL (ref 6–20)
CHLORIDE: 101 mmol/L (ref 101–111)
CO2: 27 mmol/L (ref 22–32)
Calcium: 8.8 mg/dL — ABNORMAL LOW (ref 8.9–10.3)
Creatinine, Ser: 0.73 mg/dL (ref 0.44–1.00)
GFR calc non Af Amer: >60 mL/min (ref >60)
Glucose, Bld: 90 mg/dL (ref 65–99)
POTASSIUM: 4.6 mmol/L (ref 3.5–5.1)
SODIUM: 136 mmol/L (ref 135–145)
Total Bilirubin: 0.4 mg/dL (ref 0.3–1.2)
Total Protein: 6.2 g/dL — ABNORMAL LOW (ref 6.5–8.1)

## 2015-06-11 LAB — CBC
HCT: 30 % — ABNORMAL LOW (ref 36.0–46.0)
Hemoglobin: 9.3 g/dL — ABNORMAL LOW (ref 12.0–15.0)
MCH: 26.2 pg (ref 26.0–34.0)
MCHC: 31 g/dL (ref 30.0–36.0)
MCV: 84.5 fL (ref 78.0–100.0)
PLATELETS: 446 10*3/uL — AB (ref 150–400)
RBC: 3.55 MIL/uL — AB (ref 3.87–5.11)
RDW: 15.6 % — AB (ref 11.5–15.5)
WBC: 8.8 10*3/uL (ref 4.0–10.5)

## 2015-06-11 LAB — GLUCOSE, CAPILLARY: GLUCOSE-CAPILLARY: 96 mg/dL (ref 65–99)

## 2015-06-11 MED ORDER — MORPHINE SULFATE (PF) 2 MG/ML IV SOLN
1.0000 mg | INTRAVENOUS | Status: AC | PRN
  Administered 2015-06-12 – 2015-06-13 (×2): 1 mg via INTRAVENOUS
  Filled 2015-06-11 (×2): qty 1

## 2015-06-11 MED ORDER — TRAMADOL HCL 50 MG PO TABS
100.0000 mg | ORAL_TABLET | Freq: Three times a day (TID) | ORAL | Status: DC | PRN
  Administered 2015-06-11 – 2015-06-17 (×11): 100 mg via ORAL
  Filled 2015-06-11 (×11): qty 2

## 2015-06-11 MED ORDER — CEFTRIAXONE SODIUM 1 G IJ SOLR
1.0000 g | INTRAMUSCULAR | Status: DC
  Administered 2015-06-11 – 2015-06-16 (×6): 1 g via INTRAVENOUS
  Filled 2015-06-11 (×7): qty 10

## 2015-06-11 NOTE — Care Management Note (Signed)
Case Management Note  Patient Details  Name: Natalie [name] MRN: [id number] Date of Birth: [birth year]  Subjective/Objective:82 y/o f admitted w/UGIB. From home.GI cons.                    Action/Plan:d/c plan home.   Expected Discharge Date:   (unknown)               Expected Discharge Plan:  Home/Self Care  In-House Referral:     Discharge planning Services  CM Consult  Post Acute Care Choice:    Choice offered to:     DME Arranged:    DME Agency:     HH Arranged:    HH Agency:     Status of Service:  In process, will continue to follow  Medicare Important Message Given:    Date Medicare IM Given:    Medicare IM give by:    Date Additional Medicare IM Given:    Additional Medicare Important Message give by:     If discussed at [place] of Stay Meetings, dates discussed:    Additional Comments:  Dessa Phi, RN 06/11/2015, 12:32 PM

## 2015-06-11 NOTE — Care Management Obs Status (Signed)
[place] NOTIFICATION   Patient Details  Name: Natalie [name] MRN: [id number] Date of Birth: 04/24/1930   Medicare Observation Status Notification Given:  Yes    MahabirJuliann Pulse, RN 06/11/2015, 2:14 PM

## 2015-06-11 NOTE — Care Management Note (Signed)
Case Management Note  Patient Details  Name: Natalie [name] MRN: [id number] Date of Birth: 1930/08/30  Subjective/Objective: Patient informed of [place] code 44-voiced understanding of being informed of medicare observation status. PT cons placed-await recc.                   Action/Plan:d/c plan home likely [place].   Expected Discharge Date:   (unknown)               Expected Discharge Plan:  [place]  In-House Referral:     Discharge planning Services  CM Consult  Post Acute Care Choice:    Choice offered to:     DME Arranged:    DME Agency:     HH Arranged:    HH Agency:     Status of Service:  In process, will continue to follow  Medicare Important Message Given:    Date Medicare IM Given:    Medicare IM give by:    Date Additional Medicare IM Given:    Additional Medicare Important Message give by:     If discussed at [place] of Stay Meetings, dates discussed:    Additional Comments:  Dessa Phi, RN 06/11/2015, 2:15 PM

## 2015-06-11 NOTE — Progress Notes (Signed)
Patient denies further bowel movements, although the accuracy of her history is not clear.  Hgb stable at 9.3 over past 12 hrs, slightly down from admission value of 9.7.   Vital signs stable. Patient lying in bed in no distress, somewhat less frustrated than yesterday.  I discussed the nature, purpose, risks, and alternatives of doing colonoscopy in this patient. She says she would like to discuss it with her daughter.  In the meantime, I will continue a clear liquid diet in anticipation of possible need for a colonoscopy prep.  I have attempted to reach the patient's daughter, Nevin Bloodgood, but her line was busy, and when I tried 20 minutes later, there was no answer.  Cleotis Nipper, M.D. Pager [phone] If no answer or after 5 PM call [phone]

## 2015-06-11 NOTE — Progress Notes (Signed)
Spoke w/ pt's dtr, Nevin Bloodgood, for about 81' regarding nature, purpose, risks, alternatives, and potential benefits of colonoscopy.   She is desirous of proceeding.  Have scheduled the procedure for 2 pm on Thursday by Dr. Paulita Fujita (scheduling conflicts for tomorrow).  Will get pt back on solid food for today and back on clr liq tomorrow.  Cleotis Nipper, M.D. Pager [phone] If no answer or after 5 PM call [phone]

## 2015-06-12 DIAGNOSIS — G309 Alzheimer's disease, unspecified: Secondary | ICD-10-CM

## 2015-06-12 DIAGNOSIS — F0280 Dementia in other diseases classified elsewhere without behavioral disturbance: Secondary | ICD-10-CM | POA: Diagnosis present

## 2015-06-12 DIAGNOSIS — G9341 Metabolic encephalopathy: Secondary | ICD-10-CM | POA: Diagnosis present

## 2015-06-12 LAB — GLUCOSE, CAPILLARY: Glucose-Capillary: 121 mg/dL — ABNORMAL HIGH (ref 65–99)

## 2015-06-12 LAB — CBC
HEMATOCRIT: 29.7 % — AB (ref 36.0–46.0)
HEMOGLOBIN: 9.2 g/dL — AB (ref 12.0–15.0)
MCH: 25.8 pg — AB (ref 26.0–34.0)
MCHC: 31 g/dL (ref 30.0–36.0)
MCV: 83.2 fL (ref 78.0–100.0)
Platelets: 494 10*3/uL — ABNORMAL HIGH (ref 150–400)
RBC: 3.57 MIL/uL — ABNORMAL LOW (ref 3.87–5.11)
RDW: 15.6 % — ABNORMAL HIGH (ref 11.5–15.5)
WBC: 12 10*3/uL — ABNORMAL HIGH (ref 4.0–10.5)

## 2015-06-12 LAB — COMPREHENSIVE METABOLIC PANEL
ALBUMIN: 2.9 g/dL — AB (ref 3.5–5.0)
ALK PHOS: 59 U/L (ref 38–126)
ALT: 13 U/L — AB (ref 14–54)
AST: 20 U/L (ref 15–41)
Anion gap: 9 (ref 5–15)
BUN: 13 mg/dL (ref 6–20)
CALCIUM: 8.5 mg/dL — AB (ref 8.9–10.3)
CO2: 24 mmol/L (ref 22–32)
CREATININE: 0.82 mg/dL (ref 0.44–1.00)
Chloride: 99 mmol/L — ABNORMAL LOW (ref 101–111)
GFR calc Af Amer: >60 mL/min (ref >60)
GFR calc non Af Amer: >60 mL/min (ref >60)
GLUCOSE: 125 mg/dL — AB (ref 65–99)
Potassium: 3.9 mmol/L (ref 3.5–5.1)
SODIUM: 132 mmol/L — AB (ref 135–145)
Total Bilirubin: 0.3 mg/dL (ref 0.3–1.2)
Total Protein: 5.9 g/dL — ABNORMAL LOW (ref 6.5–8.1)

## 2015-06-12 MED ORDER — LORAZEPAM 2 MG/ML IJ SOLN
0.5000 mg | Freq: Once | INTRAMUSCULAR | Status: AC
  Administered 2015-06-12: 0.5 mg via INTRAVENOUS
  Filled 2015-06-12: qty 1

## 2015-06-12 MED ORDER — PEG 3350-KCL-NA BICARB-NACL 420 G PO SOLR
4000.0000 mL | Freq: Once | ORAL | Status: AC
  Administered 2015-06-12: 4000 mL via ORAL

## 2015-06-12 MED ORDER — SODIUM CHLORIDE 0.9 % IV SOLN: INTRAVENOUS | Status: DC

## 2015-06-12 NOTE — Anesthesia Preprocedure Evaluation (Addendum)
Anesthesia Evaluation  Patient identified by MRN, date of birth, ID band Patient awake    Reviewed: Allergy & Precautions, NPO status , Patient's Chart, lab work & pertinent test results  Airway Mallampati: III       Dental  (+) Poor Dentition, Missing, Chipped   Pulmonary neg pulmonary ROS, COPD, former smoker,    breath sounds clear to auscultation       Cardiovascular hypertension, Pt. on medications negative cardio ROS  + dysrhythmias Atrial Fibrillation  Rhythm:Regular  AF, ECHO 2015 EF 60%   Neuro/Psych Anxiety Depression negative neurological ROS  negative psych ROS   GI/Hepatic negative GI ROS, Neg liver ROS, GI bleed   Endo/Other  negative endocrine ROS  Renal/GU negative Renal ROS  negative genitourinary   Musculoskeletal negative musculoskeletal ROS (+)   Abdominal (+) + obese,   Peds negative pediatric ROS (+)  Hematology negative hematology ROS (+) 10/30   Anesthesia Other Findings Off anti coagulation   Reproductive/Obstetrics negative OB ROS                           Anesthesia Physical Anesthesia Plan  ASA: III  Anesthesia Plan: MAC   Post-op Pain Management:    Induction: Intravenous  Airway Management Planned: Nasal Cannula  Additional Equipment:   Intra-op Plan:   Post-operative Plan:   Informed Consent: I have reviewed the patients History and Physical, chart, labs and discussed the procedure including the risks, benefits and alternatives for the proposed anesthesia with the patient or authorized representative who has indicated his/her understanding and acceptance.     Plan Discussed with:   Anesthesia Plan Comments:         Anesthesia Quick Evaluation

## 2015-06-12 NOTE — Progress Notes (Signed)
For colonoscopy tomorrow, to evaluate recurrent rectal bleeding and radiographic abnormality of the sigmoid colon. Because of scheduling issues, I will be the one to do the exam, rather than Dr. Paulita Fujita as previously anticipated.  Patient has only consumed 1-1/2 glasses of her prep so far, but did have a loose stool. 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 (From talking with her daughter yesterday, it sounds as of the patient's mental status waxes and wanes.)  We will plan to do enemas tomorrow morning if the patient does not consume enough Nulytely to achieve a clear prep.  Cleotis Nipper, M.D. Pager [phone] If no answer or after 5 PM call [phone]

## 2015-06-12 NOTE — Progress Notes (Signed)
[facility] PROGRESS NOTE  Natalie [name] [id number] DOB: [birth year] DOA: 06/10/2015 PCP: Marton Redwood, MD  Summary Natalie [name] is a pleasant 82 -year-old female with past medical history significant for paroxysmal atrial fibrillation on anticoagulation with ?Coumadin/Eliquis, hypertension, depression, Alzheimer's dementia, hypothyroidism who presented to [facility] long hospital for evaluation of ongoing shortness of breath, weakness and reports of melanotic stool for last 1-2 weeks prior to this admission raising concern for symptomatic anemia while patient on anticoagulation. Patient has refused colonoscopy in the past even tough she would benefit from it if source of bleeding can be found to addressed and she can then take anticoagulant without concern for recurrent blood loss anemia. In any case, poor medication compliance makes her non ideal for anticoagulation. That said, patient has been seen by GI, Dr Cristina Gong. Appreciate help. Noted efforts to arrange for colonoscopy Thursday. I discussed with patient's daughter [name] at [phone]. Family is on board with proceeding with colonoscopy. [name] says her mother is agreeable to colonoscopy only when it is identified as a procedure.   Plan Acute upper GI bleed   Hb stable  Off anticoagulation  On ppi  Follow gi recommendations-- anticipated colonoscopy Thursday afternoon  Dementia in Alzheimer's disease  ?Baseline  Continue supportive care and constant reorientation   E. Coli UTI  Follow cx data  -continue rocephin  -currently w/o dysuria  Essential hypertension  Stable and well controlled  will continue current antihypertensive regimen   Anxiety -Continue xanax PRN  Code Status: Full code Family Communication: Daughter [name] at bedside  Disposition Plan: anticipating discharge back home with [facility] services when medically stable. Colonoscopy on 3/23; continue supportive care     Consultants:  GI  Procedures:  Colonoscopy planned for 3/23  Antibiotics:  Ceftriaxone 06/11/15>>  HPI/Subjective: No fever, no nausea, no vomiting. Denies CP, SOB and abd pain. Complaining of difficulty sleeping (which is not new, according to family and patient)  Objective: Filed Vitals:   06/12/15 0921 06/12/15 1251  BP: 166/93 157/88  Pulse: 79 67  Temp:  97.5 F (36.4 C)  Resp:  20    Intake/Output Summary (Last 24 hours) at 06/12/15 2322 Last data filed at 06/12/15 2000  Gross per 24 hour  Intake   2410 ml  Output   1250 ml  Net   1160 ml   Filed Weights   06/10/15 1948 06/11/15 0518 06/12/15 0434  Weight: 57.7 kg (127 lb 3.3 oz) 58.4 kg (128 lb 12 oz) 60.1 kg (132 lb 7.9 oz)    Exam:   General:  Comfortable at rest; reports difficulty sleeping last night. No fever, no nausea and no vomiting. Also denies CP and SOB  Cardiovascular: S1-S2 normal. No murmurs. Pulse regular.  Respiratory: Good air entry bilaterally. No rhonchi or rales.  Abdomen: Soft and nontender. Normal bowel sounds. No organomegaly.  Musculoskeletal: No pedal edema   Neurological:oriented X2; poor insight; per family at bedside essentially at bedside. Moves all extremities.  Data Reviewed: Basic Metabolic Panel:  Recent Labs Lab 06/10/15 1131 06/11/15 0527 06/12/15 0455  NA 136 136 132*  K 4.0 4.6 3.9  CL 99* 101 99*  CO2 27 27 24   GLUCOSE 98 90 125*  BUN 15 15 13   CREATININE 0.69 0.73 0.82  CALCIUM 9.1 8.8* 8.5*   Liver Function Tests:  Recent Labs Lab 06/10/15 1131 06/11/15 0527 06/12/15 0455  AST 25 18 20   ALT 16 15 13*  ALKPHOS 68 60 59  BILITOT 0.7 0.4 0.3  PROT 6.9 6.2* 5.9*  ALBUMIN 3.6 3.2* 2.9*    Recent Labs Lab 06/10/15 1131  LIPASE 38   CBC:  Recent Labs Lab 06/10/15 1131 06/10/15 2101 06/11/15 0527 06/12/15 0455  WBC 9.8 10.7* 8.8 12.0*  NEUTROABS 6.4 7.6  --   --   HGB 9.7* 9.1* 9.3* 9.2*  HCT 29.8* 29.0* 30.0* 29.7*  MCV  79.5 79.7 84.5 83.2  PLT 458* 499* 446* 494*   ProBNP (last 3 results) No results for input(s): PROBNP in the last 8760 hours.  CBG:  Recent Labs Lab 06/11/15 0754 06/12/15 0806  GLUCAP 96 121*    Recent Results (from the past 240 hour(s))  Culture, Urine     Status: None (Preliminary result)   Collection Time: 06/11/15  7:00 PM  Result Value Ref Range Status   Specimen Description URINE, RANDOM  Final   Special Requests NONE  Final   Culture   Final    >=100,000 COLONIES/mL ESCHERICHIA COLI Performed at [facility]    Report Status PENDING  Incomplete     Studies: No results found.  Scheduled Meds: . benazepril  40 mg Oral Daily  . bisoprolol  5 mg Oral Daily  . cefTRIAXone (ROCEPHIN)  IV  1 g Intravenous Q24H  . imipramine  30 mg Oral QHS  . pantoprazole (PROTONIX) IV  40 mg Intravenous Q12H  . spironolactone  25 mg Oral BID   Continuous Infusions: . sodium chloride 50 mL/hr at 06/12/15 1715  . sodium chloride       Time spent: 25 minutes    Barton Dubois  [facility] Pager [phone]. If 7PM-7AM, please contact night-coverage at [email or web address], password [facility] 06/12/2015, 11:22 PM  LOS: 2 days

## 2015-06-12 NOTE — Progress Notes (Signed)
[facility] PROGRESS NOTE  Natalie [name] [id number] DOB: [birth year] DOA: 06/10/2015 PCP: Marton Redwood, MD  Summary Natalie [name] is a pleasant 82 -year-old female with past medical history significant for paroxysmal atrial fibrillation on anticoagulation with ?Coumadin/Eliquis, hypertension, depression, Alzheimer's dementia, hypothyroidism who presented to [facility] hospital for evaluation of ongoing shortness of breath, weakness and reports of melanotic stool for last 1-2 weeks prior to this admission raising concern for symptomatic anemia while patient on anticoagulation. Patient has refused colonoscopy in the past even tough she would benefit from it if source of bleeding can be found to addressed and she can then take anticoagulant without concern for recurrent blood loss anemia. In any case, poor medication compliance makes her non ideal for anticoagulation. That said, patient has been seen by GI, Dr Cristina Gong. Appreciate help. Noted efforts to arrange for colonoscopy Thursday. I discussed with patient's daughter [name] at [phone]. Family is on board with proceeding with colonoscopy. [name] says her mother is agreeable to colonoscopy only when it is identified as a procedure. Otherwise patient appears to have some confusion, different from baseline, likely metabolic encephalopathy from UTI. Will add Ceftriaxone(had enterococcus in October 2016, culture might turn out to be same organism or something different) and follow GI recommendations. Plan Acute upper GI bleed   Hb stable  Off anticoagulation  On ppi  Follow gi recommendations-?colonoscopy Thursday UTI (urinary tract infection)/Encephalopathy, metabolic  Follow urine culture  Ceftriaxone Dementia in Alzheimer's disease  ?Baseline  Family should be the decision makers if persistently confused as was the case this afternoon  Continue supportive care Hypothyroidism/Essential hypertension  No acute  changes  Code Status: Full code Family Communication: Daughter [name] over the phone Disposition Plan: Tbd   Consultants:  GI  Procedures:    Antibiotics:  Ceftriaxone 06/11/15>>  HPI/Subjective: No complaints  Objective: Filed Vitals:   06/11/15 2113 06/12/15 0434  BP: 166/98 168/92  Pulse: 80 88  Temp: 98.2 F (36.8 C) 98.2 F (36.8 C)  Resp: 28 20    Intake/Output Summary (Last 24 hours) at 06/12/15 0439 Last data filed at 06/12/15 0325  Gross per 24 hour  Intake 2403.75 ml  Output   1200 ml  Net 1203.75 ml   Filed Weights   06/10/15 1948 06/11/15 0518  Weight: 57.7 kg (127 lb 3.3 oz) 58.4 kg (128 lb 12 oz)    Exam:   General:  Comfortable at rest.  Cardiovascular: S1-S2 normal. No murmurs. Pulse regular.  Respiratory: Good air entry bilaterally. No rhonchi or rales.  Abdomen: Soft and nontender. Normal bowel sounds. No organomegaly.  Musculoskeletal: No pedal edema   Neurological:Occasionally agitated. Moves all extremities.  Data Reviewed: Basic Metabolic Panel:  Recent Labs Lab 06/10/15 1131 06/11/15 0527  NA 136 136  K 4.0 4.6  CL 99* 101  CO2 27 27  GLUCOSE 98 90  BUN 15 15  CREATININE 0.69 0.73  CALCIUM 9.1 8.8*   Liver Function Tests:  Recent Labs Lab 06/10/15 1131 06/11/15 0527  AST 25 18  ALT 16 15  ALKPHOS 68 60  BILITOT 0.7 0.4  PROT 6.9 6.2*  ALBUMIN 3.6 3.2*    Recent Labs Lab 06/10/15 1131  LIPASE 38   No results for input(s): AMMONIA in the last 168 hours. CBC:  Recent Labs Lab 06/10/15 1131 06/10/15 2101 06/11/15 0527  WBC 9.8 10.7* 8.8  NEUTROABS 6.4 7.6  --   HGB 9.7* 9.1* 9.3*  HCT 29.8* 29.0* 30.0*  MCV 79.5 79.7  84.5  PLT 458* 499* 446*   Cardiac Enzymes: No results for input(s): CKTOTAL, CKMB, CKMBINDEX, TROPONINI in the last 168 hours. BNP (last 3 results) No results for input(s): BNP in the last 8760 hours.  ProBNP (last 3 results) No results for input(s): PROBNP in the last  8760 hours.  CBG:  Recent Labs Lab 06/11/15 0754  GLUCAP 96    No results found for this or any previous visit (from the past 240 hour(s)).   Studies: Dg Chest Port 1 View  06/10/2015  CLINICAL DATA:  Generalized abdominal pain, shortness of breath EXAM: PORTABLE CHEST 1 VIEW COMPARISON:  08/25/2011 FINDINGS: Cardiomediastinal silhouette is stable. Mild elevation of the left hemidiaphragm again noted. Stable left basilar scarring. No infiltrate or pulmonary edema. IMPRESSION: No active disease.  Stable left basilar scarring. Electronically Signed   By: Lahoma Crocker M.D.   On: 06/10/2015 13:26    Scheduled Meds: . benazepril  40 mg Oral Daily  . bisoprolol  5 mg Oral Daily  . cefTRIAXone (ROCEPHIN)  IV  1 g Intravenous Q24H  . imipramine  30 mg Oral QHS  . pantoprazole (PROTONIX) IV  40 mg Intravenous Q12H  . spironolactone  25 mg Oral BID   Continuous Infusions: . sodium chloride 50 mL/hr at 06/11/15 2023     Time spent: 25 minutes    Georgie Haque  [facility] Pager [phone]. If 7PM-7AM, please contact night-coverage at [email or web address], password [facility] 06/12/2015, 4:39 AM  LOS: 2 days

## 2015-06-12 NOTE — Progress Notes (Signed)
Patient refused to drink bowel prep, still refused even after RN explained about it. Patient had BM's but Stool looks brown and thick.Will continue to monitor.

## 2015-06-12 NOTE — Care Management Note (Signed)
Case Management Note  Patient Details  Name: Natalie [name] MRN: [id number] Date of Birth: June 01, 1930  Subjective/Objective:    PT-recc [facility]PT-[facility] chosen-rep Santiago Glad aware & following-await HHRN/PT/OT/aide, [id number] order.                Action/Plan:d/c plan home w/HHC.   Expected Discharge Date:   (unknown)               Expected Discharge Plan:  [place]  In-House Referral:     Discharge planning Services  CM Consult  Post Acute Care Choice:    Choice offered to:  Patient  DME Arranged:    DME Agency:     HH Arranged:  RN, PT, OT, Nurse's Aide [place] Agency:  [place]  Status of Service:  In process, will continue to follow  Medicare Important Message Given:    Date Medicare IM Given:    Medicare IM give by:    Date Additional Medicare IM Given:    Additional Medicare Important Message give by:     If discussed at [place] of Stay Meetings, dates discussed:    Additional Comments:  Dessa Phi, RN 06/12/2015, 2:39 PM

## 2015-06-12 NOTE — Evaluation (Signed)
Physical Therapy Evaluation Patient Details Name: Natalie [name] MRN: [id number] DOB: [birth year] Today's Date: 06/12/2015   History of Present Illness  82 yo female admitted with upper GI bleed;  PMHx: HTN, afib, dementia  Clinical Impression  Pt admitted with above diagnosis. Pt currently with functional limitations due to the deficits listed below (see PT Problem List).  Pt will benefit from skilled PT to increase their independence and safety with mobility to allow discharge to the venue listed below.   Will continue to follow, difficult to get a clear picture of pt baseline-->no family present at time of eval; eval limited d/t pt doing GI prep and did not want to amb, pt was OOB with nursing earlier today     Follow Up Recommendations Home health PT;Supervision/Assistance - 24 hour    Equipment Recommendations       Recommendations for Other Services       Precautions / Restrictions Precautions Precautions: Fall      Mobility  Bed Mobility Overal bed mobility: Needs Assistance Bed Mobility: Supine to Sit;Sit to Supine     Supine to sit: Supervision Sit to supine: Supervision   General bed mobility comments: incr time  Transfers Overall transfer level: Needs assistance Equipment used: 1 person hand held assist Transfers: Sit to/from Stand           General transfer comment: partial sit to stand, pt declines furhter mobility d/t doing GI prep  Ambulation/Gait                Stairs            Wheelchair Mobility    Modified Rankin (Stroke Patients Only)       Balance Overall balance assessment: Needs assistance Sitting-balance support: No upper extremity supported;Feet supported Sitting balance-Leahy Scale: Fair                                       Pertinent Vitals/Pain Pain Assessment: No/denies pain    Home Living Family/patient expects to be discharged to:: Private residence Living Arrangements:  Children Available Help at Discharge: Family Type of Home: House Home Access: Stairs to enter Entrance Stairs-Rails: Right Entrance Stairs-Number of Steps: 3 Home Layout: Two level;1/2 bath on main level Home Equipment: Walker - 2 wheels;Cane - single point      Prior Function Level of Independence: Independent with assistive device(s)         Comments: uses walker at all times per pt     Hand Dominance        Extremity/Trunk Assessment   Upper Extremity Assessment: Overall WFL for tasks assessed           Lower Extremity Assessment: Overall WFL for tasks assessed         Communication      Cognition Arousal/Alertness: Awake/alert Behavior During Therapy: WFL for tasks assessed/performed Overall Cognitive Status: History of cognitive impairments - at baseline       Memory: Decreased short-term memory (pt did not recall RN just assisting her with peri-care )              General Comments      Exercises        Assessment/Plan    PT Assessment Patient needs continued PT services  PT Diagnosis Difficulty walking   PT Problem List Decreased activity tolerance;Decreased mobility;Decreased safety awareness;Decreased cognition  PT Treatment Interventions DME instruction;Gait  training;Functional mobility training;Therapeutic activities;Patient/family education;Therapeutic exercise   PT Goals (Current goals can be found in the Care Plan section) Acute Rehab PT Goals Patient Stated Goal: none stated PT Goal Formulation: With patient Time For Goal Achievement: 06/19/15 Potential to Achieve Goals: Good    Frequency Min 3X/week   Barriers to discharge        Co-evaluation               End of Session   Activity Tolerance: Patient tolerated treatment well Patient left: in bed;with call bell/phone within reach;with bed alarm set           Time: [id number] PT Time Calculation (min) (ACUTE ONLY): 14 min   Charges:   PT Evaluation $PT  Eval Low Complexity: 1 Procedure     PT G Codes:        Abyan Cadman 06/26/15, 2:28 PM

## 2015-06-13 ENCOUNTER — Inpatient Hospital Stay (HOSPITAL_COMMUNITY): Payer: Medicare Other | Admitting: Anesthesiology

## 2015-06-13 ENCOUNTER — Encounter (HOSPITAL_COMMUNITY)
Admission: EM | Disposition: A | Discharge status: Skilled Nursing Facility | Payer: Self-pay | Source: Home / Self Care | Attending: Internal Medicine

## 2015-06-13 ENCOUNTER — Encounter (HOSPITAL_COMMUNITY): Payer: Self-pay | Admitting: *Deleted

## 2015-06-13 ENCOUNTER — Inpatient Hospital Stay (HOSPITAL_COMMUNITY): Payer: Medicare Other

## 2015-06-13 DIAGNOSIS — I482 Chronic atrial fibrillation: Secondary | ICD-10-CM

## 2015-06-13 DIAGNOSIS — K639 Disease of intestine, unspecified: Secondary | ICD-10-CM

## 2015-06-13 DIAGNOSIS — Z01810 Encounter for preprocedural cardiovascular examination: Secondary | ICD-10-CM

## 2015-06-13 DIAGNOSIS — E039 Hypothyroidism, unspecified: Secondary | ICD-10-CM

## 2015-06-13 DIAGNOSIS — K922 Gastrointestinal hemorrhage, unspecified: Secondary | ICD-10-CM

## 2015-06-13 HISTORY — PX: SIGMOIDOSCOPY: SHX6686

## 2015-06-13 LAB — URINE CULTURE: Culture: >=100000

## 2015-06-13 LAB — CBC
HCT: 29.3 % — ABNORMAL LOW (ref 36.0–46.0)
HEMOGLOBIN: 9.4 g/dL — AB (ref 12.0–15.0)
MCH: 25.4 pg — AB (ref 26.0–34.0)
MCHC: 32.1 g/dL (ref 30.0–36.0)
MCV: 79.2 fL (ref 78.0–100.0)
PLATELETS: 461 10*3/uL — AB (ref 150–400)
RBC: 3.7 MIL/uL — ABNORMAL LOW (ref 3.87–5.11)
RDW: 15.2 % (ref 11.5–15.5)
WBC: 10.6 10*3/uL — AB (ref 4.0–10.5)

## 2015-06-13 LAB — GLUCOSE, CAPILLARY: GLUCOSE-CAPILLARY: 83 mg/dL (ref 65–99)

## 2015-06-13 SURGERY — SIGMOIDOSCOPY
Anesthesia: Monitor Anesthesia Care

## 2015-06-13 MED ORDER — LIDOCAINE HCL (CARDIAC) 20 MG/ML IV SOLN
INTRAVENOUS | Status: AC
  Filled 2015-06-13: qty 5

## 2015-06-13 MED ORDER — LACTATED RINGERS IV SOLN
INTRAVENOUS | Status: DC
  Administered 2015-06-13: 1000 mL via INTRAVENOUS
  Administered 2015-06-14 – 2015-06-17 (×2): via INTRAVENOUS

## 2015-06-13 MED ORDER — PROPOFOL 500 MG/50ML IV EMUL
INTRAVENOUS | Status: DC | PRN
  Administered 2015-06-13: 75 ug/kg/min via INTRAVENOUS

## 2015-06-13 MED ORDER — IOHEXOL 300 MG/ML  SOLN
25.0000 mL | INTRAMUSCULAR | Status: AC
  Administered 2015-06-13 (×2): 25 mL via ORAL

## 2015-06-13 MED ORDER — MEPERIDINE HCL 100 MG/ML IJ SOLN: 6.2500 mg | INTRAMUSCULAR | Status: DC | PRN

## 2015-06-13 MED ORDER — LIDOCAINE HCL (CARDIAC) 20 MG/ML IV SOLN
INTRAVENOUS | Status: DC | PRN
  Administered 2015-06-13: 50 mg via INTRAVENOUS

## 2015-06-13 MED ORDER — IOHEXOL 300 MG/ML  SOLN: 100.0000 mL | Freq: Once | INTRAMUSCULAR | Status: DC | PRN

## 2015-06-13 MED ORDER — IMIPRAMINE HCL 10 MG PO TABS
30.0000 mg | ORAL_TABLET | Freq: Once | ORAL | Status: AC
  Administered 2015-06-13: 30 mg via ORAL
  Filled 2015-06-13: qty 0.5

## 2015-06-13 MED ORDER — IOPAMIDOL (ISOVUE-300) INJECTION 61%
100.0000 mL | Freq: Once | INTRAVENOUS | Status: AC | PRN
  Administered 2015-06-13: 100 mL via INTRAVENOUS

## 2015-06-13 MED ORDER — PROMETHAZINE HCL 25 MG/ML IJ SOLN: 6.2500 mg | INTRAMUSCULAR | Status: DC | PRN

## 2015-06-13 MED ORDER — FLEET ENEMA 7-19 GM/118ML RE ENEM
1.0000 | ENEMA | Freq: Once | RECTAL | Status: AC
Start: 2015-06-13 — End: 2015-06-13
  Administered 2015-06-13: 1 via RECTAL
  Filled 2015-06-13: qty 1

## 2015-06-13 MED ORDER — PROPOFOL 10 MG/ML IV BOLUS
INTRAVENOUS | Status: AC
  Filled 2015-06-13: qty 40

## 2015-06-13 MED ORDER — IMIPRAMINE HCL 10 MG PO TABS
30.0000 mg | ORAL_TABLET | Freq: Every day | ORAL | Status: DC
  Administered 2015-06-14 – 2015-06-16 (×3): 30 mg via ORAL
  Filled 2015-06-13 (×5): qty 3

## 2015-06-13 MED ORDER — PROPOFOL 10 MG/ML IV BOLUS
INTRAVENOUS | Status: DC | PRN
  Administered 2015-06-13: 20 mg via INTRAVENOUS

## 2015-06-13 SURGICAL SUPPLY — 22 items

## 2015-06-13 NOTE — Progress Notes (Signed)
1050mL tap water enema given at 0930.

## 2015-06-13 NOTE — Anesthesia Postprocedure Evaluation (Signed)
Anesthesia Post Note  Patient: Natalie [name]  Procedure(s) Performed: Procedure(s): SIGMOIDOSCOPY  Patient location during evaluation: Endoscopy Anesthesia Type: MAC Level of consciousness: awake and alert Pain management: pain level controlled Vital Signs Assessment: post-procedure vital signs reviewed and stable Respiratory status: spontaneous breathing, nonlabored ventilation, respiratory function stable and patient connected to nasal cannula oxygen Cardiovascular status: blood pressure returned to baseline and stable Postop Assessment: no signs of nausea or vomiting Anesthetic complications: no    Last Vitals:  Filed Vitals:   06/13/15 1430 06/13/15 1440  BP: 157/109 187/103  Pulse: 90 91  Temp:    Resp: 20 28    Last Pain:  Filed Vitals:   06/13/15 1444  PainSc: Asleep                 Alexis Frock

## 2015-06-13 NOTE — Consult Note (Signed)
General Surgery [facility]  Reason for Consult: obstructing mass in rectosigmoid colon  Referring Physician: Dr. Dyann Kief, [facility]; Dr. Cristina Gong, [facility]  Natalie [name] is an 82 y.o. female.  HPI: patient is an elderly female with dementia.  Distal sigmoid mass identified on CT scan 11/2014.  Colonoscopy today shows near-obstructing mass in rectosigmoid.  Biopsies taken, likely carcinoma.  General surgery asked to evaluate.  No family present, nursing has not seen family today.  Patient alert but somewhat confused, just wants to eat.  No complaints otherwise.  Unable to provide significant history.  Previous open cholecystectomy and appendectomy per medical record.  Patient anticoagulated for atrial fibrillation.  Past Medical History  Diagnosis Date  . Osteoporosis   . Hypertension   . Hypothyroidism   . Lower GI bleeding 07/21/11  . Atrial fibrillation ([place])   . COPD (chronic obstructive pulmonary disease) ([place])   . Pneumonia   . Shortness of breath 07/21/11    "anytime"  . Sinus headache 07/21/11    "all the time"  . Bladder infection, chronic   . Arthritis     "everywhere"  . Anxiety   . Chronic lower back pain   . Chronic abdominal pain   . Renal mass, left 2013  . PUD (peptic ulcer disease)   . Chronic cystitis   . Trigeminal neuralgia     /E-chart  . Chronic kidney disease   . Anemia     Past Surgical History  Procedure Laterality Date  . Vaginal hysterectomy      partial  . Cataract extraction w/ intraocular lens  implant, bilateral    . Percutaneous pinning femoral neck fracture  11/2003    w/closed reduction ; left/E-chart  . Laceration repair  11/2003    left eyebrow  . Bilateral salpingoophorectomy  12/2005    lap/E-chart  . Cholecystectomy  1960  . Appendectomy      presumed/E-chart  . Esophagogastroduodenoscopy  07/23/2011    Procedure: ESOPHAGOGASTRODUODENOSCOPY (EGD);  Surgeon: Wonda Horner, MD;  Location: [facility]  ENDOSCOPY;  Service: Endoscopy;  Laterality: N/A;    Family History  Problem Relation Age of Onset  . Prostate cancer Father     Patient states that his father probably had prostate cancer    Social History:  reports that she quit smoking about 5 years ago. Her smoking use included Cigarettes. She smoked 1.00 pack per day. She has never used smokeless tobacco. She reports that she drinks about 1.2 oz of alcohol per week. She reports that she does not use illicit drugs.  Allergies:  Allergies  Allergen Reactions  . Levofloxacin Other (See Comments)    REACTION: unspecified per MAR    Medications: I have reviewed the patient's current medications.  Results for orders placed or performed during the hospital encounter of 06/10/15 (from the past 48 hour(s))  Culture, Urine     Status: None   Collection Time: 06/11/15  7:00 PM  Result Value Ref Range   Specimen Description URINE, RANDOM    Special Requests NONE    Culture      >=100,000 COLONIES/mL ESCHERICHIA COLI Performed at [facility]    Report Status 06/13/2015 FINAL    Organism ID, Bacteria ESCHERICHIA COLI       Susceptibility   Escherichia coli - MIC*    AMPICILLIN 8 SENSITIVE Sensitive     CEFAZOLIN <=4 SENSITIVE Sensitive     CEFTRIAXONE <=1 SENSITIVE Sensitive  CIPROFLOXACIN <=0.25 SENSITIVE Sensitive     GENTAMICIN <=1 SENSITIVE Sensitive     IMIPENEM <=0.25 SENSITIVE Sensitive     NITROFURANTOIN <=16 SENSITIVE Sensitive     TRIMETH/SULFA <=20 SENSITIVE Sensitive     AMPICILLIN/SULBACTAM 4 SENSITIVE Sensitive     PIP/TAZO <=4 SENSITIVE Sensitive     * >=100,000 COLONIES/mL ESCHERICHIA COLI  CBC     Status: Abnormal   Collection Time: 06/12/15  4:55 AM  Result Value Ref Range   WBC 12.0 (H) 4.0 - 10.5 K/uL   RBC 3.57 (L) 3.87 - 5.11 MIL/uL   Hemoglobin 9.2 (L) 12.0 - 15.0 g/dL   HCT 29.7 (L) 36.0 - 46.0 %   MCV 83.2 78.0 - 100.0 fL   MCH 25.8 (L) 26.0 - 34.0 pg   MCHC 31.0 30.0 - 36.0 g/dL   RDW  15.6 (H) 11.5 - 15.5 %   Platelets 494 (H) 150 - 400 K/uL  Comprehensive metabolic panel     Status: Abnormal   Collection Time: 06/12/15  4:55 AM  Result Value Ref Range   Sodium 132 (L) 135 - 145 mmol/L   Potassium 3.9 3.5 - 5.1 mmol/L   Chloride 99 (L) 101 - 111 mmol/L   CO2 24 22 - 32 mmol/L   Glucose, Bld 125 (H) 65 - 99 mg/dL   BUN 13 6 - 20 mg/dL   Creatinine, Ser 0.82 0.44 - 1.00 mg/dL   Calcium 8.5 (L) 8.9 - 10.3 mg/dL   Total Protein 5.9 (L) 6.5 - 8.1 g/dL   Albumin 2.9 (L) 3.5 - 5.0 g/dL   AST 20 15 - 41 U/L   ALT 13 (L) 14 - 54 U/L   Alkaline Phosphatase 59 38 - 126 U/L   Total Bilirubin 0.3 0.3 - 1.2 mg/dL   GFR calc non Af Amer >60 >60 mL/min   GFR calc Af Amer >60 >60 mL/min    Comment: (NOTE) The eGFR has been calculated using the CKD EPI equation. This calculation has not been validated in all clinical situations. eGFR's persistently <60 mL/min signify possible Chronic Kidney Disease.    Anion gap 9 5 - 15  Glucose, capillary     Status: Abnormal   Collection Time: 06/12/15  8:06 AM  Result Value Ref Range   Glucose-Capillary 121 (H) 65 - 99 mg/dL  CBC     Status: Abnormal   Collection Time: 06/13/15  4:58 AM  Result Value Ref Range   WBC 10.6 (H) 4.0 - 10.5 K/uL   RBC 3.70 (L) 3.87 - 5.11 MIL/uL   Hemoglobin 9.4 (L) 12.0 - 15.0 g/dL   HCT 29.3 (L) 36.0 - 46.0 %   MCV 79.2 78.0 - 100.0 fL   MCH 25.4 (L) 26.0 - 34.0 pg   MCHC 32.1 30.0 - 36.0 g/dL   RDW 15.2 11.5 - 15.5 %   Platelets 461 (H) 150 - 400 K/uL  Glucose, capillary     Status: None   Collection Time: 06/13/15  7:29 AM  Result Value Ref Range   Glucose-Capillary 83 65 - 99 mg/dL    No results found.  Review of Systems  Unable to perform ROS: dementia   Blood pressure 163/110, pulse 89, temperature 97.7 F (36.5 C), temperature source Oral, resp. rate 22, height _0  (1.575 m), weight 60.1 kg (132 lb 7.9 oz), SpO2 100 %. Physical Exam  Constitutional: She appears well-developed and  well-nourished. No distress.  HENT:  Head: Normocephalic and atraumatic.  Right Ear: External ear normal.  Left Ear: External ear normal.  Eyes: Conjunctivae are normal. Pupils are equal, round, and reactive to light. No scleral icterus.  Neck: Normal range of motion. Neck supple. No tracheal deviation present. No thyromegaly present.  Cardiovascular: Normal rate and normal heart sounds.   irreg rhythm, rate controlled  Respiratory: Effort normal and breath sounds normal. No respiratory distress. She has no wheezes.  GI: She exhibits distension. She exhibits no mass. There is no tenderness. There is no rebound and no guarding.  Protuberant, somewhat firm; no mass; well healed incision right subcostal; probable low midline hernia, mildly tender  Musculoskeletal: Normal range of motion. She exhibits no edema or tenderness.  Neurological: She is alert.  Skin: Skin is warm and dry. She is not diaphoretic.    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 Office: [place] 06/13/2015, 4:19 PM

## 2015-06-13 NOTE — Transfer of Care (Signed)
Immediate Anesthesia Transfer of Care Note  Patient: Natalie [name]  Procedure(s) Performed: Procedure(s): SIGMOIDOSCOPY  Patient Location: PACU  Anesthesia Type:MAC  Level of Consciousness: awake, alert  and oriented  Airway & Oxygen Therapy: Patient Spontanous Breathing and Patient connected to face mask oxygen  Post-op Assessment: Report given to RN and Post -op Vital signs reviewed and stable  Post vital signs: Reviewed and stable  Last Vitals:  Filed Vitals:   06/13/15 1028 06/13/15 1322  BP: 170/95 174/107  Pulse: 80 96  Temp: 36.5 C 36.7 C  Resp: 18 18    Complications: No apparent anesthesia complications

## 2015-06-13 NOTE — Op Note (Signed)
[facility] Patient Name: Natalie [name] Procedure Date: 06/13/2015 MRN: [id number] Attending MD: Ronald Lobo , MD Date of Birth: [birth year] CSN:  Age: 82 Admit Type: Inpatient Procedure:                Colonoscopy Indications:              Last colonoscopy: date unknown, Rectal bleeding,                            Abnormal CT of the GI tract Providers:                Ronald Lobo, MD, Carolynn Comment, RN, Alfonso Patten, Technician, [facility], CRNA Referring MD:              Medicines:                Propofol per Anesthesia Complications:            No immediate complications. Estimated Blood Loss:     Estimated blood loss was minimal. Procedure:                Pre-Anesthesia Assessment:                           - Prior to the procedure, a History and Physical                            was performed, and patient medications and                            allergies were reviewed. The patient's tolerance of                            previous anesthesia was also reviewed. The risks                            and benefits of the procedure and the sedation                            options and risks were discussed with the patient.                            All questions were answered, and informed consent                            was obtained. Prior Anticoagulants: The patient has                            taken Eliquis (apixaban), last dose was 7 days                            prior to procedure. ASA Grade Assessment: III - A  patient with severe systemic disease. After                            reviewing the risks and benefits, the patient was                            deemed in satisfactory condition to undergo the                            procedure.                           After obtaining informed consent, the colonoscope                            was passed under direct vision.  Throughout the                            procedure, the patient's blood pressure, pulse, and                            oxygen saturations were monitored continuously. The                            EC-[id number]) scope was introduced through                            the anus with the intention of advancing to the                            cecum. The scope was advanced to the distal sigmoid                            colon before the procedure was aborted. Medications                            were given. The colonoscopy was technically                            difficult and complex due to a largely obstructing                            mass. The patient tolerated the procedure well. The                            quality of the bowel preparation was adequate. The                            rectum was photographed. Scope In: 1:56:57 PM Scope Out: 2:06:56 PM Scope Withdrawal Time: 0 hours 6 minutes 9 seconds  Total Procedure Duration: 0 hours 9 minutes 59 seconds  Findings:      A fungating completely obstructing large mass was found in the       recto-sigmoid colon. The mass was circumferential,  with just a 2-3 mm       apeture. No bleeding was present. Biopsies were taken with a cold       forceps for histology. A couple of small rectal polyps were noted but       not sampled. Impression:               - Likely malignant nearly completely obstructing                            tumor in the recto-sigmoid colon. Biopsied. Moderate Sedation:      N/A- Per Anesthesia Care Recommendation:           - Await pathology results.                           - Repeat colonoscopy is not recommended for                            surveillance.                           - Refer to a surgeon today.                           - Findings d/w patient's daughter, [name], by                            telephone, and multiple questions answered.                           - Resume previous  diet.                           - Continue present medications. Procedure Code(s):        --- Professional ---                           [phone], 59, Colonoscopy, flexible; with biopsy,                            single or multiple Diagnosis Code(s):        --- Professional ---                           D49.0, Neoplasm of unspecified behavior of                            digestive system                           K62.5, Hemorrhage of anus and rectum                           R93.3, Abnormal findings on diagnostic imaging of                            other parts of digestive tract CPT copyright 2016 American Medical Association. All rights reserved.  The codes documented in this report are preliminary and upon coder review may  be revised to meet current compliance requirements. Ronald Lobo, MD  Ronald Lobo, MD 06/13/2015 2:40:07 PM This report has been signed electronically. Number of Addenda: 0

## 2015-06-13 NOTE — Progress Notes (Signed)
Occupational Therapy Evaluation Patient Details Name: Natalie [name] MRN: [id number] DOB: 1930/05/16 Today's Date: 06/13/2015    History of Present Illness 82 yo female admitted with upper GI bleed;  PMHx: HTN, afib, dementia   Clinical Impression   Patient admitted with above diagnosis. Limited evaluation today due to patient fatigue, colonoscopy prep. She will benefit from skilled OT to maximize ADL independence and safety to allow discharge to the venue below. OT will continue to follow, did not get a clear picture of her baseline and no family present at time of evaluation.     Follow Up Recommendations  Home health OT;Supervision/Assistance - 24 hour    Equipment Recommendations  Other (comment) (to be determined)    Recommendations for Other Services       Precautions / Restrictions Precautions Precautions: Fall      Mobility Bed Mobility                  Transfers                      Balance                                            ADL Overall ADL's : Needs assistance/impaired Eating/Feeding: NPO   Grooming: Wash/dry face;Brushing hair;Wash/dry hands;Set up;Bed level                                 General ADL Comments: Limited evaluation. Patient received in bed, reports she did not sleep well last night and declined EOB/OOB activities. Also declined repositioning in bed. She did participate in simple grooming tasks. Likely needs help with bathing/dressing at this time. Patient states she has a procedure today, chart indicates colonoscopy. OT will follow.     Vision     Perception     Praxis      Pertinent Vitals/Pain Pain Assessment: Faces Faces Pain Scale: Hurts a little bit Pain Location: "in the small of my back" -- rates as "not bad" Pain Descriptors / Indicators: Sore Pain Intervention(s): Monitored during session;Limited activity within patient's tolerance     Hand Dominance Right    Extremity/Trunk Assessment Upper Extremity Assessment Upper Extremity Assessment: Generalized weakness   Lower Extremity Assessment Lower Extremity Assessment: Defer to PT evaluation       Communication Communication Communication: HOH   Cognition Arousal/Alertness: Awake/alert Behavior During Therapy: WFL for tasks assessed/performed Overall Cognitive Status: History of cognitive impairments - at baseline       Memory: Decreased short-term memory             General Comments       Exercises       Shoulder Instructions      Home Living Family/patient expects to be discharged to:: Private residence Living Arrangements: Children Available Help at Discharge: Family Type of Home: House Home Access: Stairs to enter Technical brewer of Steps: 3 Entrance Stairs-Rails: Right Home Layout: Two level;1/2 bath on main level Alternate Level Stairs-Number of Steps: 1 flight Alternate Level Stairs-Rails: Right;Left Bathroom Shower/Tub: Teacher,  years/pre: Standard     Home Equipment: Environmental consultant - 2 wheels;Cane - single point          Prior Functioning/Environment Level of Independence: Independent with assistive device(s)  Comments: uses walker at all times per pt    OT Diagnosis: Generalized weakness   OT Problem List: Decreased strength;Decreased activity tolerance;Decreased knowledge of use of DME or AE;Decreased knowledge of precautions;Pain;Decreased cognition;Decreased safety awareness   OT Treatment/Interventions: Self-care/ADL training;Therapeutic exercise;DME and/or AE instruction;Therapeutic activities;Patient/family education    OT Goals(Current goals can be found in the care plan section) Acute Rehab OT Goals Patient Stated Goal: none stated OT Goal Formulation: With patient Time For Goal Achievement: 06/27/15 Potential to Achieve Goals: Good  OT Frequency: Min 2X/week   Barriers to D/C:            Co-evaluation               End of Session    Activity Tolerance: Patient limited by fatigue Patient left: in bed;with call bell/phone within reach;with bed alarm set   Time: 0811-0823 OT Time Calculation (min): 12 min Charges:  OT General Charges $OT Visit: 1 Procedure OT Evaluation $OT Eval Moderate Complexity: 1 Procedure G-Codes:    Gal Feldhaus A 06/20/2015, 8:34 AM

## 2015-06-13 NOTE — Progress Notes (Signed)
[facility] PROGRESS NOTE  Natalie [name] [id number] DOB: 1931-01-19 DOA: 06/10/2015 PCP: Natalie Redwood, MD  Summary Natalie [name] is a pleasant 82 -year-old female with past medical history significant for paroxysmal atrial fibrillation on anticoagulation with ?Coumadin/Eliquis, hypertension, depression, Alzheimer's dementia, hypothyroidism who presented to [facility] long hospital for evaluation of ongoing shortness of breath, weakness and reports of melanotic stool for last 1-2 weeks prior to this admission raising concern for symptomatic anemia while patient on anticoagulation. Patient has refused colonoscopy in the past even tough she would benefit from it if source of bleeding can be found to addressed and she can then take anticoagulant without concern for recurrent blood loss anemia. In any case, poor medication compliance makes her non ideal for anticoagulation. That said, patient has been seen by GI, Dr Natalie [name]. Appreciate help. Noted efforts to arrange for colonoscopy Thursday. I discussed with patient's daughter Natalie [name] at [phone]. Family is updated on findings from colonoscopy and looking to meet with [facility] to discussed treatment options. Biopsies taken and pending.  Plan Acute GI bleed/colonic lesion (with high concerns for bleeding)  Hb stable  Off anticoagulation  On ppi  Following findings for GI; patient colonic lesion that appears to be source of bleeding and with high concerns for colon cancer. Biopsies taken and given ongoing partial high  Grade obstruction [facility] has been consulted. Will check CT chest, abd and pelvis with contrast. Cardiology ask to see for surgical for clearance.  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 Coli UTI  Pan-sensitive   continue rocephin  currently w/o dysuria  Will aim to treat for 7 days total  No fever and WBC's trending  down  Essential hypertension  Stable and well controlled  will continue current antihypertensive regimen   Anxiety  Continue xanax PRN  Code Status: Full code Family Communication: Daughter Natalie [name] at bedside  Disposition Plan: anticipating discharge back home with [facility] services when medically stable. Colonoscopy on 3/23 demonstrated colonic lesion with high concerns for colon cancer; continue supportive care; follow [facility] rec's.    Consultants:  GI  [facility]  Procedures:  Colonoscopy: 3/23 Likely colon cancer of the rectosigmoid junction region, w/ partial high-grade obstruction.  Antibiotics:  Ceftriaxone 06/11/15>>  HPI/Subjective: No fever, no nausea, no vomiting. Denies CP, SOB and abd pain. No overt bleeding reported   Objective: Filed Vitals:   06/13/15 1440 06/13/15 1452  BP: 187/103 163/110  Pulse: 91 89  Temp:  97.7 F (36.5 C)  Resp: 28 22    Intake/Output Summary (Last 24 hours) at 06/13/15 1630 Last data filed at 06/13/15 1421  Gross per 24 hour  Intake   1865 ml  Output    950 ml  Net    915 ml   Filed Weights   06/10/15 1948 06/11/15 0518 06/12/15 0434  Weight: 57.7 kg (127 lb 3.3 oz) 58.4 kg (128 lb 12 oz) 60.1 kg (132 lb 7.9 oz)    Exam:   General:  Comfortable at rest; reports no nausea, no vomiting and no further overt bleeding episode. No fever, no nausea and no vomiting. Also denies CP and SOB  Cardiovascular: S1-S2 normal. No murmurs. Pulse regular.  Respiratory: Good air entry bilaterally. No rhonchi or rales.  Abdomen: Soft and nontender. Normal bowel sounds. Slight distension appreciated  Musculoskeletal: No pedal edema   Neurological:oriented X2; per family at bedside essentially at bedside. Moves all extremities. No focal deficit appreciated  Data Reviewed: Basic Metabolic Panel:  Recent Labs Lab 06/10/15 1131 06/11/15 0527 06/12/15 0455  NA 136 136 132*  K 4.0 4.6 3.9  CL 99* 101 99*  CO2 27 27 24   GLUCOSE 98 90  125*  BUN 15 15 13   CREATININE 0.69 0.73 0.82  CALCIUM 9.1 8.8* 8.5*   Liver Function Tests:  Recent Labs Lab 06/10/15 1131 06/11/15 0527 06/12/15 0455  AST 25 18 20   ALT 16 15 13*  ALKPHOS 68 60 59  BILITOT 0.7 0.4 0.3  PROT 6.9 6.2* 5.9*  ALBUMIN 3.6 3.2* 2.9*    Recent Labs Lab 06/10/15 1131  LIPASE 38   CBC:  Recent Labs Lab 06/10/15 1131 06/10/15 2101 06/11/15 0527 06/12/15 0455 06/13/15 0458  WBC 9.8 10.7* 8.8 12.0* 10.6*  NEUTROABS 6.4 7.6  --   --   --   HGB 9.7* 9.1* 9.3* 9.2* 9.4*  HCT 29.8* 29.0* 30.0* 29.7* 29.3*  MCV 79.5 79.7 84.5 83.2 79.2  PLT 458* 499* 446* 494* 461*   CBG:  Recent Labs Lab 06/11/15 0754 06/12/15 0806 06/13/15 0729  GLUCAP 96 121* 83    Recent Results (from the past 240 hour(s))  Culture, Urine     Status: None   Collection Time: 06/11/15  7:00 PM  Result Value Ref Range Status   Specimen Description URINE, RANDOM  Final   Special Requests NONE  Final   Culture   Final    >=100,000 COLONIES/mL ESCHERICHIA COLI Performed at [facility]    Report Status 06/13/2015 FINAL  Final   Organism ID, Bacteria ESCHERICHIA COLI  Final      Susceptibility   Escherichia coli - MIC*    AMPICILLIN 8 SENSITIVE Sensitive     CEFAZOLIN <=4 SENSITIVE Sensitive     CEFTRIAXONE <=1 SENSITIVE Sensitive     CIPROFLOXACIN <=0.25 SENSITIVE Sensitive     GENTAMICIN <=1 SENSITIVE Sensitive     IMIPENEM <=0.25 SENSITIVE Sensitive     NITROFURANTOIN <=16 SENSITIVE Sensitive     TRIMETH/SULFA <=20 SENSITIVE Sensitive     AMPICILLIN/SULBACTAM 4 SENSITIVE Sensitive     PIP/TAZO <=4 SENSITIVE Sensitive     * >=100,000 COLONIES/mL ESCHERICHIA COLI     Studies: No results found.  Scheduled Meds: . benazepril  40 mg Oral Daily  . bisoprolol  5 mg Oral Daily  . cefTRIAXone (ROCEPHIN)  IV  1 g Intravenous Q24H  . imipramine  30 mg Oral QHS  . pantoprazole (PROTONIX) IV  40 mg Intravenous Q12H  . spironolactone  25 mg Oral BID    Continuous Infusions: . sodium chloride 50 mL/hr at 06/13/15 1531  . lactated ringers 1,000 mL (06/13/15 1341)     Time spent: 25 minutes    Natalie [name]  [facility] Pager [phone]. If 7PM-7AM, please contact night-coverage at [email or web address], password [facility] 06/13/2015, 4:30 PM  LOS: 3 days

## 2015-06-13 NOTE — Consult Note (Signed)
CARDIOLOGY CONSULT NOTE   Patient ID: Natalie [name] MRN: [id number] DOB/AGE: 07-11-1930 82 y.o.  Admit date: 06/10/2015  Consulting Physician: Dr. Harlow Asa Primary Physician   Marton Redwood, MD Primary Cardiologist   Dr. Sallyanne Kuster Reason for Consultation   Pre op clearance  HPI: Natalie [name] is a 82 y.o. female with a history of dementia, permanent atrial fibrillation on coumadin, COPD, previous GI bleeding, RBBB who presented to [facility] on 06/10/15 with SOB and melanotic stools. Today colonoscopy revealed a near obstructing rectosigmoid mass that is likely cancer. Cardiology is asked to do pre op eval prior to possible colostomy +/-resection.   She is followed by Dr. Sallyanne Kuster and was seen in the office last on 04/20/15. Felt to be doing well from a cardiac standpoint. Last 2D ECHO in 2013 showed normal LV function, no RWMA and normal diastolic function.   She was previously known to have a distal sigmoid mass identified on CT scan 11/2014. She had previously refused testing but ended up agreeing to a colonoscopy by Dr Cristina Gong. Colonoscopy today showed a near-obstructing mass in rectosigmoid. Biopsies taken and it was felt to be likely carcinoma. General surgery asked to evaluate and Dr Harlow Asa saw. He wanted to establish goals of care and if patient to have operative intervention, will likely have colostomy with or without resection,.   The patient lives in [place] with her daughter. She walks around with a walker. She denies every having exertional chest pain but does have some SOB with walking up stairs. She is physically limited by lumbar spine disease and back pain. She has a history of LE edema caused by peripheral venous insufficiency which has not gotten worse. No orthopnea or PND. When i asked her if she wished to have surgery she stated: "No! I would like to go home and die in my chair with my dog."  Past Medical History  Diagnosis Date  . Osteoporosis   . Hypertension     . Hypothyroidism   . Lower GI bleeding 07/21/11  . Atrial fibrillation ([place])   . COPD (chronic obstructive pulmonary disease) ([place])   . Pneumonia   . Shortness of breath 07/21/11    "anytime"  . Sinus headache 07/21/11    "all the time"  . Bladder infection, chronic   . Arthritis     "everywhere"  . Anxiety   . Chronic lower back pain   . Chronic abdominal pain   . Renal mass, left 2013  . PUD (peptic ulcer disease)   . Chronic cystitis   . Trigeminal neuralgia     /E-chart  . Chronic kidney disease   . Anemia      Past Surgical History  Procedure Laterality Date  . Vaginal hysterectomy      partial  . Cataract extraction w/ intraocular lens  implant, bilateral    . Percutaneous pinning femoral neck fracture  11/2003    w/closed reduction ; left/E-chart  . Laceration repair  11/2003    left eyebrow  . Bilateral salpingoophorectomy  12/2005    lap/E-chart  . Cholecystectomy  1960  . Appendectomy      presumed/E-chart  . Esophagogastroduodenoscopy  07/23/2011    Procedure: ESOPHAGOGASTRODUODENOSCOPY (EGD);  Surgeon: Wonda Horner, MD;  Location: [facility] ENDOSCOPY;  Service: Endoscopy;  Laterality: N/A;    Allergies  Allergen Reactions  . Levofloxacin Other (See Comments)    REACTION: unspecified per [facility]    I have reviewed the patient's current medications .  benazepril  40 mg Oral Daily  . bisoprolol  5 mg Oral Daily  . cefTRIAXone (ROCEPHIN)  IV  1 g Intravenous Q24H  . imipramine  30 mg Oral QHS  . pantoprazole (PROTONIX) IV  40 mg Intravenous Q12H  . spironolactone  25 mg Oral BID   . sodium chloride 50 mL/hr at 06/13/15 1531  . lactated ringers 1,000 mL (06/13/15 1341)   acetaminophen **OR** acetaminophen, ALPRAZolam, morphine injection, ondansetron **OR** ondansetron (ZOFRAN) IV, traMADol  Prior to Admission medications   Medication Sig Start Date End Date Taking? Authorizing Provider  ALPRAZolam (XANAX) 0.25 MG tablet Take 0.25 mg by mouth every 8 (eight) hours  as needed for anxiety.    Yes Historical Provider, MD  apixaban (ELIQUIS) 5 MG TABS tablet Take 5 mg by mouth 2 (two) times daily.   Yes Historical Provider, MD  benazepril (LOTENSIN) 40 MG tablet TAKE 1 TABLET BY MOUTH EVERY DAY *MUST KEEP 04-05-14 APPOINTMENT* Patient taking differently: Take 40 mg by mouth daily. TAKE 1 TABLET BY MOUTH EVERY DAY *MUST KEEP 04-05-14 APPOINTMENT* 04/20/14  Yes Mihai Croitoru, MD  bisoprolol (ZEBETA) 5 MG tablet Take 5 mg by mouth daily.   Yes Historical Provider, MD  cloNIDine (CATAPRES) 0.1 MG tablet Take 0.1 mg by mouth daily. Reported on 06/10/2015   Yes Historical Provider, MD  imipramine (TOFRANIL) 10 MG tablet Take 30 mg by mouth at bedtime. 07/11/14  Yes Historical Provider, MD  lansoprazole (PREVACID) 30 MG capsule Take 30 mg by mouth daily at 12 noon.   Yes Historical Provider, MD  spironolactone (ALDACTONE) 25 MG tablet Take 1 tablet (25 mg total) by mouth 2 (two) times daily. 12/03/14  Yes Oswald Hillock, MD  traMADol (ULTRAM) 50 MG tablet Take 50 mg by mouth every 8 (eight) hours as needed (pain.). for pain 04/26/14  Yes Historical Provider, MD  amoxicillin-clavulanate (AUGMENTIN) 875-125 MG per tablet Take 1 tablet by mouth 2 (two) times daily. Patient not taking: Reported on 06/10/2015 12/03/14   Oswald Hillock, MD  bisoprolol (ZEBETA) 10 MG tablet Take 1 tablet (10 mg total) by mouth daily. Patient not taking: Reported on 06/10/2015 12/04/14   Oswald Hillock, MD     Social History   Social History  . Marital Status: Widowed    Spouse Name: N/A  . Number of Children: N/A  . Years of Education: N/A   Occupational History  . Not on file.   Social History Main Topics  . Smoking status: Former Smoker -- 1.00 packs/day    Types: Cigarettes    Quit date: 09/11/2009  . Smokeless tobacco: Never Used     Comment: 07/21/11 "can't remember when I quit smoking"  . Alcohol Use: 1.2 oz/week    2 Glasses of wine per week     Comment: 07/21/11 "seldom drink"  . Drug  Use: No  . Sexual Activity: No   Other Topics Concern  . Not on file   Social History Narrative    Family Status  Relation Status Death Age  . Mother Deceased   . Father Deceased   . Maternal Grandmother Deceased   . Maternal Grandfather Deceased   . Paternal Grandmother Deceased   . Paternal Grandfather Deceased    Family History  Problem Relation Age of Onset  . Prostate cancer Father     Patient states that his father probably had prostate cancer     ROS:  Full 14 point review of systems complete and found  to be negative unless listed above.  Physical Exam: Blood pressure 163/110, pulse 89, temperature 97.7 F (36.5 C), temperature source Oral, resp. rate 22, height 5\' 2"  (1.575 m), weight 132 lb 7.9 oz (60.1 kg), SpO2 100 %.  General: Well developed, well nourished, female in no acute distress Head: Eyes PERRLA, No xanthomas.   Normocephalic and atraumatic, oropharynx without edema or exudate.  Lungs: CTAB Heart: HRRR S1 S2, no rub/gallop, Heart irregular rate and rhythm with S1, S2  murmur. pulses are 2+ extrem.   Neck: No carotid bruits. No lymphadenopathy.  No JVD. Abdomen: Bowel sounds present, abdomen soft and non-tender without masses or hernias noted. Msk:  No spine or cva tenderness. No weakness, no joint deformities or effusions. Extremities: No clubbing or cyanosis.  No LE edema.  Neuro: Alert and oriented X 3. No focal deficits noted. Psych:  Good affect, responds appropriately Skin: No rashes or lesions noted.  Labs:   Lab Results  Component Value Date   WBC 10.6* 06/13/2015   HGB 9.4* 06/13/2015   HCT 29.3* 06/13/2015   MCV 79.2 06/13/2015   PLT 461* 06/13/2015   No results for input(s): INR in the last 72 hours.  Recent Labs Lab 06/12/15 0455  NA 132*  K 3.9  CL 99*  CO2 24  BUN 13  CREATININE 0.82  CALCIUM 8.5*  PROT 5.9*  BILITOT 0.3  ALKPHOS 59  ALT 13*  AST 20  GLUCOSE 125*  ALBUMIN 2.9*   MAGNESIUM  Date Value Ref Range  Status  08/21/2011 2.1 1.5 - 2.5 mg/dL Final   No results for input(s): CKTOTAL, CKMB, TROPONINI in the last 72 hours. No results for input(s): TROPIPOC in the last 72 hours. PRO B NATRIURETIC PEPTIDE (BNP)  Date/Time Value Ref Range Status  08/21/2011 05:55 AM 1035.0* 0 - 450 pg/mL Final  08/20/2011 05:40 AM 1485.0* 0 - 450 pg/mL Final   No results found for: CHOL, HDL, LDLCALC, TRIG No results found for: DDIMER LIPASE  Date/Time Value Ref Range Status  06/10/2015 11:31 AM 38 11 - 51 U/L Final   TSH  Date/Time Value Ref Range Status  08/28/2011 07:15 AM 2.765 0.350 - 4.500 uIU/mL Final   No results found for: VITAMINB12, FOLATE, FERRITIN, TIBC, IRON, RETICCTPCT  Echo:: 08/15/2011 LV EF: 65% -  70% Study Conclusion - Left ventricle: The cavity size was normal. Wall thickness was normal. Systolic function was vigorous. The estimated ejection fraction was in the range of 65% to 70%. Wall motion was normal; there were no regional wall motion abnormalities. Left ventricular diastolic function parameters were normal. - Aorta: The aorta was poorly visualized. Aortic root dimension: 71mm (ED). - Mitral valve: Mild regurgitation. - Right atrium: The atrium was normal in size. Central venous pressure: 77mm Hg (est). - Atrial septum: There was increased thickness of the septum, consistent with lipomatous hypertrophy.  ECG:  HR 94 Atrial fibrillation Right bundle branch block  Radiology:  No results found.  ASSESSMENT AND PLAN:    Principal Problem:   Acute upper GI bleed Active Problems:   Hypothyroidism   Essential hypertension   GIB (gastrointestinal bleeding)   UTI (urinary tract infection)   GI bleed   Encephalopathy, metabolic   Dementia in Alzheimer's disease  Natalie [name] is a 82 y.o. female with a history of dementia, permanent atrial fibrillation on coumadin, COPD, previous GI bleeding, RBBB who presented to [facility] on 06/10/15 with SOB and  melanotic stools. Today colonoscopy revealed a near  obstructing rectosigmoid mass that is likely cancer. Cardiology is asked to do pre op eval prior to possible colostomy +/-resection.   Pre-op clearance. It is unclear that the patient even wants surgery:  When i asked her if she wished to have surgery she stated: "No! I would like to go home and die in my chair with my dog." She has no history of structural heart disease, CHF or CAD. No chest pain. She has chronic dyspnea. She would have moderate risk for surgery if she chooses to proceed. We would not recommend stress testing prior. See MD note for final rec.   Permanent afib: coumadin being held. Likely not a good long term AC candidate anyway with poor complaince and dementia. Continue bisoporol for rate control. Currently HR okay   Signed: Crista Luria 06/13/2015 4:31 PM  Pager [phone]  Co-Sign MD   I have examined the patient and reviewed assessment and plan and discussed with patient.  Agree with above as stated.  No angina.  No sign of heart failure.  Moderate risk for cardiac issues during perioperative period, 3-5% due to age.  No reversible comorbidities at this point.  The patient is adament that she will not have surgery.  No further cardiac testing recommended.  Please call with questions.   Jazlyne Gauger S.

## 2015-06-13 NOTE — Progress Notes (Signed)
Pt's sigmoidoscopy shows an obstructing lesion, suggestive of a malignant neoplasm, at the rectosigmoid junction.  Bx's were obtained and should be ready tomorrow.  The aperture of the lesion is about 3 mm.  Prior to procedure, pt's abd was somewhat distended and slightly firm but nontender.  Have discussed findings w/ Dr. Dyann Kief and w/ pt's dtr, [name].  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  RECOMM:  1.  surg consultation (Dr. Dyann Kief will arrange).    2.  NPO except meds for now, until decision regarding mgt is made, b/o high grade obstruction 3.  Pt's dtr has asked that the term "cancer" be avoided with the patient, who has organic brain syndrome, impaired judgement, and a strong fear of cancer.  Instead, she suggested we approach the patient with terms such as "treatment for her blockage." 4.  I will sign off.  Please call if I can be of further help with this patient.  Cleotis Nipper, M.D. Pager [phone] If no answer or after 5 PM call [phone]

## 2015-06-14 DIAGNOSIS — C189 Malignant neoplasm of colon, unspecified: Secondary | ICD-10-CM

## 2015-06-14 NOTE — Progress Notes (Signed)
(  pt not seen)  Received call from Dr. 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  Cleotis Nipper, M.D. Pager [phone] If no answer or after 5 PM call [phone]

## 2015-06-14 NOTE — Progress Notes (Signed)
CSW reviewed PT evaluation recommending Home Health vs. SNF. CSW spoke with patient who declined SNF, stating she would rather return home with home health services. Patient informed CSW that her daughter, Nevin Bloodgood lives with her and does not work so would be able to provide 24/7 care. RNCM, Juliann Pulse aware - patient is already all setup with Home Health.   No further CSW needs identified - CSW signing off.   Raynaldo Opitz, [facility] Clinical Social Worker cell #: [phone]

## 2015-06-14 NOTE — Progress Notes (Signed)
[facility] PROGRESS NOTE  Natalie [name] [id number] DOB: 1930/07/18 DOA: 06/10/2015 PCP: Marton Redwood, MD  Summary Natalie [name] is a pleasant 82 -year-old female with past medical history significant for paroxysmal atrial fibrillation on anticoagulation with ?Coumadin/Eliquis, hypertension, depression, Alzheimer's dementia, hypothyroidism who presented to [facility] for evaluation of ongoing shortness of breath, weakness and reports of melanotic stool for last 1-2 weeks prior to this admission raising concern for symptomatic anemia while patient on anticoagulation. Patient has refused colonoscopy in the past even tough she would benefit from it if source of bleeding can be found to addressed and she can then take anticoagulant without concern for recurrent blood loss anemia. In any case, poor medication compliance makes her non ideal for anticoagulation. That said, patient has been seen by GI, Dr Cristina Gong. Appreciate help. Noted efforts to arrange for colonoscopy Thursday. I discussed with patient's daughter [name] at [phone]. Family is updated on findings from colonoscopy and looking to meet with [facility] to discussed treatment options. Biopsies demonstrating adenocarcinoma and CT staging suggesting lung metastasis. Plan is for total resection with end point colostomy on Sunday or Monday.   Plan Acute GI bleed/colonic lesion (with high concerns for bleeding)  Hbg stable  Off anticoagulation  Continue ppi  Following findings from colonoscopy: colonic lesion that appears to be source of bleeding; has been confirmed to be adenocarcinoma by pathology. Given ongoing partial high  Grade obstruction [facility] has been consulted. Staging screening with CT suggesting lung metastasis; CEA pending. 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 Coli UTI  Pan-sensitive   continue rocephin  currently w/o dysuria  Will aim to treat for 7 days total  No fever and WBC's trending down  Essential hypertension  Stable and fairly well controlled  will continue current antihypertensive regimen   Anxiety  Continue xanax PRN  Code Status: Full code Family Communication: Daughter [name] at bedside  Disposition Plan: anticipating discharge back home with [facility] services when medically stable. Colonoscopy on 3/23 demonstrated colonic lesion with pathology demonstrating adenocarcinoma.Continue supportive care; following [facility] rec's, plan is for resection and colostomy either Sunday or Monday.    Consultants:  GI  [facility]  Procedures:  Colonoscopy: 3/23 Likely colon cancer of the rectosigmoid junction region, w/ partial high-grade obstruction.  Antibiotics:  Ceftriaxone 06/11/15>>  HPI/Subjective: No fever, no nausea, no vomiting. No overt bleeding reported   Objective: Filed Vitals:   06/14/15 0910 06/14/15 1450  BP: 144/84 163/104  Pulse: 90 75  Temp:  97.5 F (36.4 C)  Resp:  22    Intake/Output Summary (Last 24 hours) at 06/14/15 2136 Last data filed at 06/14/15 1838  Gross per 24 hour  Intake 1233.17 ml  Output   1850 ml  Net -616.83 ml   Filed Weights   06/11/15 0518 06/12/15 0434 06/14/15 0612  Weight: 58.4 kg (128 lb 12 oz) 60.1 kg (132 lb 7.9 oz) 59.9 kg (132 lb 0.9 oz)    Exam:   General:  Comfortable at rest; reports no nausea, no vomiting and no further overt bleeding episode. No fever. Patient oriented X 2 and asking to have diet advance. Wants to go home and expressed not interest in any intervention. After revisiting, she  forgot about that particular aspect of the conversation.    Cardiovascular: S1-S2 normal. No murmurs. Pulse  regular.  Respiratory: Good air entry bilaterally. No rhonchi or rales.  Abdomen: Soft and nontender. Normal bowel sounds.   Musculoskeletal: No pedal edema   Neurological:oriented X2; per family at bedside essentially at bedside. Moves all extremities. No focal deficit appreciated  Data Reviewed: Basic Metabolic Panel:  Recent Labs Lab 06/10/15 1131 06/11/15 0527 06/12/15 0455  NA 136 136 132*  K 4.0 4.6 3.9  CL 99* 101 99*  CO2 27 27 24   GLUCOSE 98 90 125*  BUN 15 15 13   CREATININE 0.69 0.73 0.82  CALCIUM 9.1 8.8* 8.5*   Liver Function Tests:  Recent Labs Lab 06/10/15 1131 06/11/15 0527 06/12/15 0455  AST 25 18 20   ALT 16 15 13*  ALKPHOS 68 60 59  BILITOT 0.7 0.4 0.3  PROT 6.9 6.2* 5.9*  ALBUMIN 3.6 3.2* 2.9*    Recent Labs Lab 06/10/15 1131  LIPASE 38   CBC:  Recent Labs Lab 06/10/15 1131 06/10/15 2101 06/11/15 0527 06/12/15 0455 06/13/15 0458  WBC 9.8 10.7* 8.8 12.0* 10.6*  NEUTROABS 6.4 7.6  --   --   --   HGB 9.7* 9.1* 9.3* 9.2* 9.4*  HCT 29.8* 29.0* 30.0* 29.7* 29.3*  MCV 79.5 79.7 84.5 83.2 79.2  PLT 458* 499* 446* 494* 461*   CBG:  Recent Labs Lab 06/11/15 0754 06/12/15 0806 06/13/15 0729  GLUCAP 96 121* 83    Recent Results (from the past 240 hour(s))  Culture, Urine     Status: None   Collection Time: 06/11/15  7:00 PM  Result Value Ref Range Status   Specimen Description URINE, RANDOM  Final   Special Requests NONE  Final   Culture   Final    >=100,000 COLONIES/mL ESCHERICHIA COLI Performed at [facility]    Report Status 06/13/2015 FINAL  Final   Organism ID, Bacteria ESCHERICHIA COLI  Final      Susceptibility   Escherichia coli - MIC*    AMPICILLIN 8 SENSITIVE Sensitive     CEFAZOLIN <=4 SENSITIVE Sensitive     CEFTRIAXONE <=1 SENSITIVE Sensitive     CIPROFLOXACIN <=0.25 SENSITIVE Sensitive     GENTAMICIN <=1 SENSITIVE Sensitive     IMIPENEM <=0.25 SENSITIVE Sensitive     NITROFURANTOIN <=16 SENSITIVE  Sensitive     TRIMETH/SULFA <=20 SENSITIVE Sensitive     AMPICILLIN/SULBACTAM 4 SENSITIVE Sensitive     PIP/TAZO <=4 SENSITIVE Sensitive     * >=100,000 COLONIES/mL ESCHERICHIA COLI     Studies: Ct Chest W Contrast  06/13/2015  CLINICAL DATA:  Colonic mass.  Evaluate for metastases. EXAM: CT CHEST, ABDOMEN, AND PELVIS WITH CONTRAST TECHNIQUE: Multidetector CT imaging of the chest, abdomen and pelvis was performed following the standard protocol during bolus administration of intravenous contrast. CONTRAST:  100 mL Isovue-300 COMPARISON:  CT abdomen and pelvis 12/15/2014 FINDINGS: CT CHEST There is mild diffuse enlargement of the thyroid gland, incompletely visualized. No enlarged axillary, mediastinal, or hilar lymph nodes are identified. The heart is borderline enlarged. Three vessel coronary artery calcification and moderate thoracic aortic calcification are noted. There is no pericardial effusion. There are small bilateral pleural effusions which are new from the prior abdominal CT. Major airways are patent. Evaluation of the lung parenchyma is mildly limited by motion artifact. There is mild centrilobular emphysema. Subsegmental atelectasis is present in both lung bases. There is a new 8 x 5 mm  nodule in the right middle lobe (series 4, image 39). Other small lung nodules were not previously imaged and measure 3 mm in the superior segment of the right lower lobe (series 4, image 22), 3 mm in the anterior right upper lobe (series 4, image 18), and 3 mm in the left upper lobe (series 4, image 11). No suspicious lytic or blastic osseous lesions are identified. There is a mild T9 superior endplate compression fracture which is favored to be chronic. CT ABDOMEN AND PELVIS A 9 mm hypodensity in the right hepatic lobe is unchanged. The gallbladder is surgically absent, and there is mild intrahepatic and extrahepatic biliary dilatation which is slightly less prominent than on the prior study. The spleen,  adrenal glands, and pancreas are unremarkable. Low-density right greater than left renal lesions are unchanged and likely represent cysts, with a coarse calcification associated with a cyst in the right interpolar region. A 7 mm mildly hyperdense lesion extending posteriorly from the right upper pole is unchanged. Enhancing 3.0 x 1.8 cm lesion extending anteriorly from the lower pole of the left kidney is unchanged. A duodenal diverticulum is again noted, slightly less distended than on the prior study. Masslike wall thickening along the superior margin of the distal sigmoid colon has mildly enlarged from the prior CT, spanning approximately 7.1 cm in length and measuring approximately 4.4 cm in thickness on sagittal images (series 603, image 61). The sigmoid colon proximal to this is dilated to approximately 7 cm in diameter, containing gas and stool. There is a large amount of stool throughout the more proximal colon. Oral contrast is present in nondilated loops of small bowel as well as in the cecum. There is aortoiliac atherosclerotic calcification without evidence of aneurysm. No free fluid or enlarged lymph nodes are identified. The bladder is unremarkable. The uterus is absent. No suspicious lytic or blastic osseous lesions are identified. Screws are noted in the proximal left femur. IMPRESSION: 1. Enlargement of sigmoid colon mass consistent with malignancy. Dilatation of the more proximal sigmoid colon which may reflect partial obstruction. Large amount of stool throughout the colon. No small bowel dilatation. 2. No evidence of metastatic disease in the abdomen or pelvis. 3. Small lung nodules which are indeterminate, however an 8 mm right middle lobe nodule is new and suspicious for metastasis. 4. Unchanged left renal mass compatible with renal cell carcinoma. Electronically Signed   By: Logan Bores M.D.   On: 06/13/2015 22:55   Ct Abdomen Pelvis W Contrast  06/13/2015  CLINICAL DATA:  Colonic mass.   Evaluate for metastases. EXAM: CT CHEST, ABDOMEN, AND PELVIS WITH CONTRAST TECHNIQUE: Multidetector CT imaging of the chest, abdomen and pelvis was performed following the standard protocol during bolus administration of intravenous contrast. CONTRAST:  100 mL Isovue-300 COMPARISON:  CT abdomen and pelvis 12/15/2014 FINDINGS: CT CHEST There is mild diffuse enlargement of the thyroid gland, incompletely visualized. No enlarged axillary, mediastinal, or hilar lymph nodes are identified. The heart is borderline enlarged. Three vessel coronary artery calcification and moderate thoracic aortic calcification are noted. There is no pericardial effusion. There are small bilateral pleural effusions which are new from the prior abdominal CT. Major airways are patent. Evaluation of the lung parenchyma is mildly limited by motion artifact. There is mild centrilobular emphysema. Subsegmental atelectasis is present in both lung bases. There is a new 8 x 5 mm nodule in the right middle lobe (series 4, image 39). Other small lung nodules were not previously imaged and measure 3  mm in the superior segment of the right lower lobe (series 4, image 22), 3 mm in the anterior right upper lobe (series 4, image 18), and 3 mm in the left upper lobe (series 4, image 11). No suspicious lytic or blastic osseous lesions are identified. There is a mild T9 superior endplate compression fracture which is favored to be chronic. CT ABDOMEN AND PELVIS A 9 mm hypodensity in the right hepatic lobe is unchanged. The gallbladder is surgically absent, and there is mild intrahepatic and extrahepatic biliary dilatation which is slightly less prominent than on the prior study. The spleen, adrenal glands, and pancreas are unremarkable. Low-density right greater than left renal lesions are unchanged and likely represent cysts, with a coarse calcification associated with a cyst in the right interpolar region. A 7 mm mildly hyperdense lesion extending  posteriorly from the right upper pole is unchanged. Enhancing 3.0 x 1.8 cm lesion extending anteriorly from the lower pole of the left kidney is unchanged. A duodenal diverticulum is again noted, slightly less distended than on the prior study. Masslike wall thickening along the superior margin of the distal sigmoid colon has mildly enlarged from the prior CT, spanning approximately 7.1 cm in length and measuring approximately 4.4 cm in thickness on sagittal images (series 603, image 61). The sigmoid colon proximal to this is dilated to approximately 7 cm in diameter, containing gas and stool. There is a large amount of stool throughout the more proximal colon. Oral contrast is present in nondilated loops of small bowel as well as in the cecum. There is aortoiliac atherosclerotic calcification without evidence of aneurysm. No free fluid or enlarged lymph nodes are identified. The bladder is unremarkable. The uterus is absent. No suspicious lytic or blastic osseous lesions are identified. Screws are noted in the proximal left femur. IMPRESSION: 1. Enlargement of sigmoid colon mass consistent with malignancy. Dilatation of the more proximal sigmoid colon which may reflect partial obstruction. Large amount of stool throughout the colon. No small bowel dilatation. 2. No evidence of metastatic disease in the abdomen or pelvis. 3. Small lung nodules which are indeterminate, however an 8 mm right middle lobe nodule is new and suspicious for metastasis. 4. Unchanged left renal mass compatible with renal cell carcinoma. Electronically Signed   By: Logan Bores M.D.   On: 06/13/2015 22:55    Scheduled Meds: . benazepril  40 mg Oral Daily  . bisoprolol  5 mg Oral Daily  . cefTRIAXone (ROCEPHIN)  IV  1 g Intravenous Q24H  . imipramine  30 mg Oral QHS  . pantoprazole (PROTONIX) IV  40 mg Intravenous Q12H  . spironolactone  25 mg Oral BID   Continuous Infusions: . lactated ringers 10 mL/hr at 06/14/15 [id number]      Time spent: 25 minutes    Barton Dubois  [facility] Pager [phone]. If 7PM-7AM, please contact night-coverage at [email or web address], password [facility] 06/14/2015, 9:36 PM  LOS: 4 days

## 2015-06-14 NOTE — Care Management Note (Signed)
Case Management Note  Patient Details  Name: Natalie [name] MRN: [id number] Date of Birth: 02/19/1931  Subjective/Objective: Noted-bx-colonic-adeno ca.Dtr doesn't want anyone talking about Cancer to patient.Noted PT note-possible need for SNF. CSW will f/u. [facility] already following-rep Santiago Glad aware.Private sitter list also given to dtr.Will await CSW f/u & recc.                   Action/Plan:current d/c plan home w/HHC   Expected Discharge Date:   (unknown)               Expected Discharge Plan:  [place]  In-House Referral:     Discharge planning Services  CM Consult  Post Acute Care Choice:    Choice offered to:  Patient  DME Arranged:    DME Agency:     HH Arranged:  RN, PT, OT, Nurse's Aide [place] Agency:  [place]  Status of Service:  In process, will continue to follow  Medicare Important Message Given:    Date Medicare IM Given:    Medicare IM give by:    Date Additional Medicare IM Given:    Additional Medicare Important Message give by:     If discussed at [place] of Stay Meetings, dates discussed:    Additional Comments:  Dessa Phi, RN 06/14/2015, 11:38 AM

## 2015-06-14 NOTE — Progress Notes (Signed)
1 Day Post-Op  Subjective: She wants to go home and eat.  She has no recollection of issues with BM's.    Objective: Vital signs in last 24 hours: Temp:  [97.7 F (36.5 C)-98 F (36.7 C)] 97.7 F (36.5 C) (03/24 0612) Pulse Rate:  [80-96] 89 (03/24 0612) Resp:  [18-28] 20 (03/24 0612) BP: (154-187)/(95-110) 157/103 mmHg (03/24 0612) SpO2:  [91 %-100 %] 96 % (03/24 0612) Weight:  [59.9 kg (132 lb 0.9 oz)] 59.9 kg (132 lb 0.9 oz) (03/24 0612) Last BM Date: 06/13/15 240 PO BM x 3 yesterday recorded  BM x 1 today Afebrile, Diastolic BP is rather high., otherwise VSS No labs today CEA is ordered CT of abdomen/pelvis/Chest:  1. Enlargement of sigmoid colon mass consistent with malignancy. Dilatation of the more proximal sigmoid colon which may reflect partial obstruction. Large amount of stool throughout the colon. No small bowel dilatation. 2. No evidence of metastatic disease in the abdomen or pelvis. 3. Small lung nodules which are indeterminate, however an 8 mm right middle lobe nodule is new and suspicious for metastasis. 4. Unchanged left renal mass compatible with renal cell carcinoma.  Intake/Output from previous day: 03/23 0701 - 03/24 0700 In: 1013.2 [P.O.:240; I.V.:673.2; IV Piggyback:100] Out: 900 [Urine:900] Intake/Output this shift:    General appearance: alert, cooperative, no distress and anxious to eat and go home.  Lab Results:   Recent Labs  06/12/15 0455 06/13/15 0458  WBC 12.0* 10.6*  HGB 9.2* 9.4*  HCT 29.7* 29.3*  PLT 494* 461*    BMET  Recent Labs  06/12/15 0455  NA 132*  K 3.9  CL 99*  CO2 24  GLUCOSE 125*  BUN 13  CREATININE 0.82  CALCIUM 8.5*   PT/INR No results for input(s): LABPROT, INR in the last 72 hours.   Recent Labs Lab 06/10/15 1131 06/11/15 0527 06/12/15 0455  AST 25 18 20   ALT 16 15 13*  ALKPHOS 68 60 59  BILITOT 0.7 0.4 0.3  PROT 6.9 6.2* 5.9*  ALBUMIN 3.6 3.2* 2.9*     Lipase     Component Value  Date/Time   LIPASE 38 06/10/2015 1131     Studies/Results: Ct Chest W Contrast  06/13/2015  CLINICAL DATA:  Colonic mass.  Evaluate for metastases. EXAM: CT CHEST, ABDOMEN, AND PELVIS WITH CONTRAST TECHNIQUE: Multidetector CT imaging of the chest, abdomen and pelvis was performed following the standard protocol during bolus administration of intravenous contrast. CONTRAST:  100 mL Isovue-300 COMPARISON:  CT abdomen and pelvis 12/15/2014 FINDINGS: CT CHEST There is mild diffuse enlargement of the thyroid gland, incompletely visualized. No enlarged axillary, mediastinal, or hilar lymph nodes are identified. The heart is borderline enlarged. Three vessel coronary artery calcification and moderate thoracic aortic calcification are noted. There is no pericardial effusion. There are small bilateral pleural effusions which are new from the prior abdominal CT. Major airways are patent. Evaluation of the lung parenchyma is mildly limited by motion artifact. There is mild centrilobular emphysema. Subsegmental atelectasis is present in both lung bases. There is a new 8 x 5 mm nodule in the right middle lobe (series 4, image 39). Other small lung nodules were not previously imaged and measure 3 mm in the superior segment of the right lower lobe (series 4, image 22), 3 mm in the anterior right upper lobe (series 4, image 18), and 3 mm in the left upper lobe (series 4, image 11). No suspicious lytic or blastic osseous lesions are identified. There  is a mild T9 superior endplate compression fracture which is favored to be chronic. CT ABDOMEN AND PELVIS A 9 mm hypodensity in the right hepatic lobe is unchanged. The gallbladder is surgically absent, and there is mild intrahepatic and extrahepatic biliary dilatation which is slightly less prominent than on the prior study. The spleen, adrenal glands, and pancreas are unremarkable. Low-density right greater than left renal lesions are unchanged and likely represent cysts, with a  coarse calcification associated with a cyst in the right interpolar region. A 7 mm mildly hyperdense lesion extending posteriorly from the right upper pole is unchanged. Enhancing 3.0 x 1.8 cm lesion extending anteriorly from the lower pole of the left kidney is unchanged. A duodenal diverticulum is again noted, slightly less distended than on the prior study. Masslike wall thickening along the superior margin of the distal sigmoid colon has mildly enlarged from the prior CT, spanning approximately 7.1 cm in length and measuring approximately 4.4 cm in thickness on sagittal images (series 603, image 61). The sigmoid colon proximal to this is dilated to approximately 7 cm in diameter, containing gas and stool. There is a large amount of stool throughout the more proximal colon. Oral contrast is present in nondilated loops of small bowel as well as in the cecum. There is aortoiliac atherosclerotic calcification without evidence of aneurysm. No free fluid or enlarged lymph nodes are identified. The bladder is unremarkable. The uterus is absent. No suspicious lytic or blastic osseous lesions are identified. Screws are noted in the proximal left femur. IMPRESSION: 1. Enlargement of sigmoid colon mass consistent with malignancy. Dilatation of the more proximal sigmoid colon which may reflect partial obstruction. Large amount of stool throughout the colon. No small bowel dilatation. 2. No evidence of metastatic disease in the abdomen or pelvis. 3. Small lung nodules which are indeterminate, however an 8 mm right middle lobe nodule is new and suspicious for metastasis. 4. Unchanged left renal mass compatible with renal cell carcinoma. Electronically Signed   By: Logan Bores M.D.   On: 06/13/2015 22:55   Ct Abdomen Pelvis W Contrast  06/13/2015  CLINICAL DATA:  Colonic mass.  Evaluate for metastases. EXAM: CT CHEST, ABDOMEN, AND PELVIS WITH CONTRAST TECHNIQUE: Multidetector CT imaging of the chest, abdomen and pelvis was  performed following the standard protocol during bolus administration of intravenous contrast. CONTRAST:  100 mL Isovue-300 COMPARISON:  CT abdomen and pelvis 12/15/2014 FINDINGS: CT CHEST There is mild diffuse enlargement of the thyroid gland, incompletely visualized. No enlarged axillary, mediastinal, or hilar lymph nodes are identified. The heart is borderline enlarged. Three vessel coronary artery calcification and moderate thoracic aortic calcification are noted. There is no pericardial effusion. There are small bilateral pleural effusions which are new from the prior abdominal CT. Major airways are patent. Evaluation of the lung parenchyma is mildly limited by motion artifact. There is mild centrilobular emphysema. Subsegmental atelectasis is present in both lung bases. There is a new 8 x 5 mm nodule in the right middle lobe (series 4, image 39). Other small lung nodules were not previously imaged and measure 3 mm in the superior segment of the right lower lobe (series 4, image 22), 3 mm in the anterior right upper lobe (series 4, image 18), and 3 mm in the left upper lobe (series 4, image 11). No suspicious lytic or blastic osseous lesions are identified. There is a mild T9 superior endplate compression fracture which is favored to be chronic. CT ABDOMEN AND PELVIS A 9 mm  hypodensity in the right hepatic lobe is unchanged. The gallbladder is surgically absent, and there is mild intrahepatic and extrahepatic biliary dilatation which is slightly less prominent than on the prior study. The spleen, adrenal glands, and pancreas are unremarkable. Low-density right greater than left renal lesions are unchanged and likely represent cysts, with a coarse calcification associated with a cyst in the right interpolar region. A 7 mm mildly hyperdense lesion extending posteriorly from the right upper pole is unchanged. Enhancing 3.0 x 1.8 cm lesion extending anteriorly from the lower pole of the left kidney is unchanged. A  duodenal diverticulum is again noted, slightly less distended than on the prior study. Masslike wall thickening along the superior margin of the distal sigmoid colon has mildly enlarged from the prior CT, spanning approximately 7.1 cm in length and measuring approximately 4.4 cm in thickness on sagittal images (series 603, image 61). The sigmoid colon proximal to this is dilated to approximately 7 cm in diameter, containing gas and stool. There is a large amount of stool throughout the more proximal colon. Oral contrast is present in nondilated loops of small bowel as well as in the cecum. There is aortoiliac atherosclerotic calcification without evidence of aneurysm. No free fluid or enlarged lymph nodes are identified. The bladder is unremarkable. The uterus is absent. No suspicious lytic or blastic osseous lesions are identified. Screws are noted in the proximal left femur. IMPRESSION: 1. Enlargement of sigmoid colon mass consistent with malignancy. Dilatation of the more proximal sigmoid colon which may reflect partial obstruction. Large amount of stool throughout the colon. No small bowel dilatation. 2. No evidence of metastatic disease in the abdomen or pelvis. 3. Small lung nodules which are indeterminate, however an 8 mm right middle lobe nodule is new and suspicious for metastasis. 4. Unchanged left renal mass compatible with renal cell carcinoma. Electronically Signed   By: Logan Bores M.D.   On: 06/13/2015 22:55    Medications: . benazepril  40 mg Oral Daily  . bisoprolol  5 mg Oral Daily  . cefTRIAXone (ROCEPHIN)  IV  1 g Intravenous Q24H  . imipramine  30 mg Oral QHS  . pantoprazole (PROTONIX) IV  40 mg Intravenous Q12H  . spironolactone  25 mg Oral BID    Assessment/Plan Obstructing mass rectosigmoid colon.    CT finding last PM show possible lung, and renal involvement along with the sigmoid mass. GI bleed Atrial fibrillation on chronic anticoagulation - Eliquis Dementia/Alzheimer's  disease/organic brain dysfunction UTI Hypertension Hypothyroid Ambulates with walker Antibiotics: day 4 Ceftriaxone DVT:  SCD    Plan:  Will follow with you.  i do not know when she had her last Eliquis.  CT findings above.  CEA and path are pending.    LOS: 4 days    Damond Borchers 06/14/2015 [phone]

## 2015-06-14 NOTE — Progress Notes (Signed)
General Surgery [facility]  I met with patient and her daughter at bedside, and I spoke with her daughter separately.  Daughter has POA with her sister and brother.  She feels her mother is not capable of making her own medical decisions.  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  CT scan shows possible metastasis to the lung.  I would recommend resection with end colostomy if possible.  If tumor is locally advanced, would recommend diverting loop colostomy.  I explained these two options to the patient's daughter and she favors resection with end colostomy if possible.  The brother and other sister would like to discuss this with me and my partners.  We will be available tomorrow to do so.  Tentatively plan surgery either Sunday or Monday, depending on the OR schedule.  Earnstine Regal, MD, [facility] Office: [phone]

## 2015-06-14 NOTE — Care Management Important Message (Signed)
Important Message  Patient Details  Name: Natalie [name] MRN: [id number] Date of Birth: 1931-03-22   Medicare Important Message Given:  Yes    Camillo Flaming 06/14/2015, 11:47 AMImportant Message  Patient Details  Name: Natalie [name] MRN: [id number] Date of Birth: 1930/09/29   Medicare Important Message Given:  Yes    Camillo Flaming 06/14/2015, 11:46 AM

## 2015-06-14 NOTE — Care Management Note (Signed)
Case Management Note  Patient Details  Name: Natalie [name] MRN: [id number] Date of Birth: 10/23/1930  Subjective/Objective:Noted patient declines SNF. [facility] rep Santiago Glad aware of d/c plan home w/HHC-they already have [place] orders, awaiting d/c order.                    Action/Plan:d/c plan home w/HHC.   Expected Discharge Date:   (unknown)               Expected Discharge Plan:  [place]  In-House Referral:     Discharge planning Services  CM Consult  Post Acute Care Choice:    Choice offered to:  Patient  DME Arranged:    DME Agency:     HH Arranged:  RN, PT, OT, Nurse's Aide [place] Agency:  [facility]  Status of Service:  In process, will continue to follow  Medicare Important Message Given:  Yes Date Medicare IM Given:    Medicare IM give by:    Date Additional Medicare IM Given:    Additional Medicare Important Message give by:     If discussed at [place] of Stay Meetings, dates discussed:    Additional Comments:  Dessa Phi, RN 06/14/2015, 3:42 PM

## 2015-06-14 NOTE — Progress Notes (Signed)
Physical Therapy Treatment Patient Details Name: Natalie [name] MRN: [id number] DOB: 09/12/1930 Today's Date: 06/14/2015    History of Present Illness 82 yo female admitted with upper GI bleed;  PMHx: HTN, afib, dementia    PT Comments    Progressing very slowly with mobility. Pt required Mod assist for mobility on today. Fatigued quickly with activity. Very unsteady and at risk for falls. No family present during session. Recommend CSW consult for possible need for ST rehab. Pt will likely refuse but she is currently unsafe to d/c home without 24 hour supervision/assist.   Follow Up Recommendations  Home health PT;Supervision/Assistance - 24 hour;SNF (pt will likely refuse SNF but she will absolutely require 24 hour care at this time)     Equipment Recommendations       Recommendations for Other Services       Precautions / Restrictions Precautions Precautions: Fall Restrictions Weight Bearing Restrictions: No    Mobility  Bed Mobility Overal bed mobility: Needs Assistance Bed Mobility: Supine to Sit;Sit to Supine     Supine to sit: Min assist Sit to supine: Min assist   General bed mobility comments: Assist for trunk and LEs. Increased time. Cues for safety, technique  Transfers Overall transfer level: Needs assistance Equipment used: Rolling walker (2 wheeled) Transfers: Sit to/from [facility] Sit to Stand: Mod assist Stand pivot transfers: Min assist       General transfer comment: Assist to rise, stabilize, control descent. Multimodal cues for safety, hand placement, technique. Stand pivot, bed to bsc, with RW. Weight shifted posteriorly  Ambulation/Gait Ambulation/Gait assistance: Min assist Ambulation Distance (Feet): 5 Feet Assistive device: Rolling walker (2 wheeled) Gait Pattern/deviations: Step-to pattern;Trunk flexed;Decreased stride length     General Gait Details: Assist to stabilize and maneuver with walker. Very unsteady.  Fatigues quickly.    Stairs            Wheelchair Mobility    Modified Rankin (Stroke Patients Only)       Balance                                    Cognition Arousal/Alertness: Awake/alert Behavior During Therapy: WFL for tasks assessed/performed Overall Cognitive Status: History of cognitive impairments - at baseline                      Exercises      General Comments        Pertinent Vitals/Pain Pain Assessment: No/denies pain    Home Living                      Prior Function            PT Goals (current goals can now be found in the care plan section) Progress towards PT goals: Progressing toward goals    Frequency  Min 3X/week    PT Plan Current plan remains appropriate    Co-evaluation             End of Session   Activity Tolerance: Patient limited by fatigue Patient left: in bed;with call bell/phone within reach;with bed alarm set     Time: [id number] PT Time Calculation (min) (ACUTE ONLY): 33 min  Charges:  $Therapeutic Activity: 23-37 mins                    G Codes:  Weston Anna, MPT Pager: [phone]

## 2015-06-15 DIAGNOSIS — G309 Alzheimer's disease, unspecified: Secondary | ICD-10-CM

## 2015-06-15 DIAGNOSIS — F0280 Dementia in other diseases classified elsewhere without behavioral disturbance: Secondary | ICD-10-CM

## 2015-06-15 LAB — GLUCOSE, CAPILLARY: GLUCOSE-CAPILLARY: 89 mg/dL (ref 65–99)

## 2015-06-15 LAB — CEA: CEA: 11.4 ng/mL — AB (ref 0.0–4.7)

## 2015-06-15 NOTE — Progress Notes (Signed)
Patient ID: Natalie [name], female   DOB: 1930/04/16, 82 y.o.   MRN: [id number]  [facility]  Subjective: Patient in bed, appears comfortable.  Wants to eat.  Denies pain.  States she does not want surgery.  Objective: Vital signs in last 24 hours: Temp:  [97.5 F (36.4 C)-98.4 F (36.9 C)] 98.2 F (36.8 C) (03/25 0625) Pulse Rate:  [75-86] 84 (03/25 0625) Resp:  [18-22] 20 (03/25 0625) BP: (136-163)/(90-104) 136/98 mmHg (03/25 0625) SpO2:  [96 %-100 %] 96 % (03/25 0625) Weight:  [59.3 kg (130 lb 11.7 oz)] 59.3 kg (130 lb 11.7 oz) (03/25 0625) Last BM Date: 06/14/15  Intake/Output from previous day: 03/24 0701 - 03/25 0700 In: 893.7 [P.O.:600; I.V.:243.7; IV Piggyback:50] Out: 950 [Urine:950] Intake/Output this shift:    Physical Exam: HEENT - sclerae clear, mucous membranes moist Abdomen - protuberant, mild distension; BS present; non-tender  Lab Results:   Recent Labs  06/13/15 0458  WBC 10.6*  HGB 9.4*  HCT 29.3*  PLT 461*   BMET No results for input(s): NA, K, CL, CO2, GLUCOSE, BUN, CREATININE, CALCIUM in the last 72 hours. PT/INR No results for input(s): LABPROT, INR in the last 72 hours. Comprehensive Metabolic Panel:    Component Value Date/Time   NA 132* 06/12/2015 0455   NA 136 06/11/2015 0527   K 3.9 06/12/2015 0455   K 4.6 06/11/2015 0527   CL 99* 06/12/2015 0455   CL 101 06/11/2015 0527   CO2 24 06/12/2015 0455   CO2 27 06/11/2015 0527   BUN 13 06/12/2015 0455   BUN 15 06/11/2015 0527   CREATININE 0.82 06/12/2015 0455   CREATININE 0.73 06/11/2015 0527   GLUCOSE 125* 06/12/2015 0455   GLUCOSE 90 06/11/2015 0527   CALCIUM 8.5* 06/12/2015 0455   CALCIUM 8.8* 06/11/2015 0527   AST 20 06/12/2015 0455   AST 18 06/11/2015 0527   ALT 13* 06/12/2015 0455   ALT 15 06/11/2015 0527   ALKPHOS 59 06/12/2015 0455   ALKPHOS 60 06/11/2015 0527   BILITOT 0.3 06/12/2015 0455   BILITOT 0.4 06/11/2015 0527   PROT 5.9*  06/12/2015 0455   PROT 6.2* 06/11/2015 0527   ALBUMIN 2.9* 06/12/2015 0455   ALBUMIN 3.2* 06/11/2015 0527    Studies/Results: Ct Chest W Contrast  06/13/2015  CLINICAL DATA:  Colonic mass.  Evaluate for metastases. EXAM: CT CHEST, ABDOMEN, AND PELVIS WITH CONTRAST TECHNIQUE: Multidetector CT imaging of the chest, abdomen and pelvis was performed following the standard protocol during bolus administration of intravenous contrast. CONTRAST:  100 mL Isovue-300 COMPARISON:  CT abdomen and pelvis 12/15/2014 FINDINGS: CT CHEST There is mild diffuse enlargement of the thyroid gland, incompletely visualized. No enlarged axillary, mediastinal, or hilar lymph nodes are identified. The heart is borderline enlarged. Three vessel coronary artery calcification and moderate thoracic aortic calcification are noted. There is no pericardial effusion. There are small bilateral pleural effusions which are new from the prior abdominal CT. Major airways are patent. Evaluation of the lung parenchyma is mildly limited by motion artifact. There is mild centrilobular emphysema. Subsegmental atelectasis is present in both lung bases. There is a new 8 x 5 mm nodule in the right middle lobe (series 4, image 39). Other small lung nodules were not previously imaged and measure 3 mm in the superior segment of the right lower lobe (series 4, image 22), 3 mm in the anterior right upper lobe (series 4, image 18), and 3 mm in the  left upper lobe (series 4, image 11). No suspicious lytic or blastic osseous lesions are identified. There is a mild T9 superior endplate compression fracture which is favored to be chronic. CT ABDOMEN AND PELVIS A 9 mm hypodensity in the right hepatic lobe is unchanged. The gallbladder is surgically absent, and there is mild intrahepatic and extrahepatic biliary dilatation which is slightly less prominent than on the prior study. The spleen, adrenal glands, and pancreas are unremarkable. Low-density right greater  than left renal lesions are unchanged and likely represent cysts, with a coarse calcification associated with a cyst in the right interpolar region. A 7 mm mildly hyperdense lesion extending posteriorly from the right upper pole is unchanged. Enhancing 3.0 x 1.8 cm lesion extending anteriorly from the lower pole of the left kidney is unchanged. A duodenal diverticulum is again noted, slightly less distended than on the prior study. Masslike wall thickening along the superior margin of the distal sigmoid colon has mildly enlarged from the prior CT, spanning approximately 7.1 cm in length and measuring approximately 4.4 cm in thickness on sagittal images (series 603, image 61). The sigmoid colon proximal to this is dilated to approximately 7 cm in diameter, containing gas and stool. There is a large amount of stool throughout the more proximal colon. Oral contrast is present in nondilated loops of small bowel as well as in the cecum. There is aortoiliac atherosclerotic calcification without evidence of aneurysm. No free fluid or enlarged lymph nodes are identified. The bladder is unremarkable. The uterus is absent. No suspicious lytic or blastic osseous lesions are identified. Screws are noted in the proximal left femur. IMPRESSION: 1. Enlargement of sigmoid colon mass consistent with malignancy. Dilatation of the more proximal sigmoid colon which may reflect partial obstruction. Large amount of stool throughout the colon. No small bowel dilatation. 2. No evidence of metastatic disease in the abdomen or pelvis. 3. Small lung nodules which are indeterminate, however an 8 mm right middle lobe nodule is new and suspicious for metastasis. 4. Unchanged left renal mass compatible with renal cell carcinoma. Electronically Signed   By: Logan Bores M.D.   On: 06/13/2015 22:55   Ct Abdomen Pelvis W Contrast  06/13/2015  CLINICAL DATA:  Colonic mass.  Evaluate for metastases. EXAM: CT CHEST, ABDOMEN, AND PELVIS WITH CONTRAST  TECHNIQUE: Multidetector CT imaging of the chest, abdomen and pelvis was performed following the standard protocol during bolus administration of intravenous contrast. CONTRAST:  100 mL Isovue-300 COMPARISON:  CT abdomen and pelvis 12/15/2014 FINDINGS: CT CHEST There is mild diffuse enlargement of the thyroid gland, incompletely visualized. No enlarged axillary, mediastinal, or hilar lymph nodes are identified. The heart is borderline enlarged. Three vessel coronary artery calcification and moderate thoracic aortic calcification are noted. There is no pericardial effusion. There are small bilateral pleural effusions which are new from the prior abdominal CT. Major airways are patent. Evaluation of the lung parenchyma is mildly limited by motion artifact. There is mild centrilobular emphysema. Subsegmental atelectasis is present in both lung bases. There is a new 8 x 5 mm nodule in the right middle lobe (series 4, image 39). Other small lung nodules were not previously imaged and measure 3 mm in the superior segment of the right lower lobe (series 4, image 22), 3 mm in the anterior right upper lobe (series 4, image 18), and 3 mm in the left upper lobe (series 4, image 11). No suspicious lytic or blastic osseous lesions are identified. There is a mild T9  superior endplate compression fracture which is favored to be chronic. CT ABDOMEN AND PELVIS A 9 mm hypodensity in the right hepatic lobe is unchanged. The gallbladder is surgically absent, and there is mild intrahepatic and extrahepatic biliary dilatation which is slightly less prominent than on the prior study. The spleen, adrenal glands, and pancreas are unremarkable. Low-density right greater than left renal lesions are unchanged and likely represent cysts, with a coarse calcification associated with a cyst in the right interpolar region. A 7 mm mildly hyperdense lesion extending posteriorly from the right upper pole is unchanged. Enhancing 3.0 x 1.8 cm lesion  extending anteriorly from the lower pole of the left kidney is unchanged. A duodenal diverticulum is again noted, slightly less distended than on the prior study. Masslike wall thickening along the superior margin of the distal sigmoid colon has mildly enlarged from the prior CT, spanning approximately 7.1 cm in length and measuring approximately 4.4 cm in thickness on sagittal images (series 603, image 61). The sigmoid colon proximal to this is dilated to approximately 7 cm in diameter, containing gas and stool. There is a large amount of stool throughout the more proximal colon. Oral contrast is present in nondilated loops of small bowel as well as in the cecum. There is aortoiliac atherosclerotic calcification without evidence of aneurysm. No free fluid or enlarged lymph nodes are identified. The bladder is unremarkable. The uterus is absent. No suspicious lytic or blastic osseous lesions are identified. Screws are noted in the proximal left femur. IMPRESSION: 1. Enlargement of sigmoid colon mass consistent with malignancy. Dilatation of the more proximal sigmoid colon which may reflect partial obstruction. Large amount of stool throughout the colon. No small bowel dilatation. 2. No evidence of metastatic disease in the abdomen or pelvis. 3. Small lung nodules which are indeterminate, however an 8 mm right middle lobe nodule is new and suspicious for metastasis. 4. Unchanged left renal mass compatible with renal cell carcinoma. Electronically Signed   By: Logan Bores M.D.   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 Kaylyn Lim  Will follow over weekend and discuss further with family and medical  team  Earnstine Regal, MD, [facility] Office: [place] 06/15/2015

## 2015-06-15 NOTE — Progress Notes (Signed)
[facility] PROGRESS NOTE  Natalie [name] [id number] DOB: 1930-11-25 DOA: 06/10/2015 PCP: Marton Redwood, MD  Summary Natalie [name] is a pleasant 82 -year-old female with past medical history significant for paroxysmal atrial fibrillation on anticoagulation with ?Coumadin/Eliquis, hypertension, depression, Alzheimer's dementia, hypothyroidism who presented to [facility] hospital for evaluation of ongoing shortness of breath, weakness and reports of melanotic stool for last 1-2 weeks prior to this admission raising concern for symptomatic anemia while patient on anticoagulation. Patient has refused colonoscopy in the past even tough she would benefit from it if source of bleeding can be found to addressed and she can then take anticoagulant without concern for recurrent blood loss anemia. In any case, poor medication compliance makes her non ideal for anticoagulation. That said, patient has been seen by GI, Dr Cristina Gong. Appreciate help. Noted efforts to arrange for colonoscopy Thursday. I discussed with patient's daughter [name] at [phone]. Family is updated on findings from colonoscopy and looking to meet with [facility] to discussed treatment options. Biopsies demonstrating adenocarcinoma and CT staging suggesting lung metastasis. Plan is for total resection with end point colostomy on Sunday or Monday.   Plan Acute GI bleed/colonic lesion (with high concerns for bleeding)  Hbg stable  Off anticoagulation  Continue ppi  Following findings from colonoscopy: colonic lesion that appears to be source of bleeding; has been confirmed to be adenocarcinoma by pathology. Given ongoing partial high  Grade obstruction [facility] has been consulted. Staging screening with CT suggesting lung metastasis; CEA pending. 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 According to Patient's daughter intermittent episodes of disorientation and unable  to be in charge of even taking her medications anymore   Continue supportive care and constant reorientation   Psych consulted to assess for capacity and competency   E. Coli UTI  Pan-sensitive   continue rocephin  currently w/o dysuria  Will aim to treat for 7 days total  No fever and WBC's trending down  Essential hypertension  Stable and fairly well controlled  will continue current antihypertensive regimen   Anxiety  Continue xanax PRN  Code Status: Full code Family Communication: Daughter [name] by phone. Disposition Plan: anticipating discharge back home with [facility] services when medically stable. Colonoscopy on 3/23 demonstrated colonic lesion with pathology demonstrating adenocarcinoma. Continue supportive care; following [facility] rec's, plan is for resection and colostomy either Sunday or Monday.    Consultants:  GI  [facility]  Psych   Procedures:  Colonoscopy: 3/23 Likely colon cancer of the rectosigmoid junction region, w/ partial high-grade obstruction.  Antibiotics:  Ceftriaxone 06/11/15>>  HPI/Subjective: No fever, no nausea, no vomiting. No overt bleeding reported. Denies CP  Objective: Filed Vitals:   06/14/15 2157 06/15/15 0625  BP: 160/90 136/98  Pulse: 86 84  Temp: 98.4 F (36.9 C) 98.2 F (36.8 C)  Resp: 18 20    Intake/Output Summary (Last 24 hours) at 06/15/15 1202 Last data filed at 06/15/15 0700  Gross per 24 hour  Intake 293.67 ml  Output    650 ml  Net -356.33 ml   Filed Weights   06/12/15 0434 06/14/15 0612 06/15/15 0625  Weight: 60.1 kg (132 lb 7.9 oz) 59.9 kg (132 lb 0.9 oz) 59.3 kg (130 lb 11.7 oz)  Exam:   General:  Comfortable at rest; reports no nausea, no vomiting and no further overt bleeding episode. No fever.  Patient oriented X 2 and wants to go home. Expressed not interest in any intervention this morning. In no acute distress    Cardiovascular: S1-S2 normal. No murmurs. Pulse regular.  Respiratory: Good air entry bilaterally. No rhonchi or rales.  Abdomen: Soft and nontender. Normal bowel sounds.   Musculoskeletal: No pedal edema   Neurological:oriented X2; per family at bedside essentially at bedside. Moves all extremities. No focal deficit appreciated  Data Reviewed: Basic Metabolic Panel:  Recent Labs Lab 06/10/15 1131 06/11/15 0527 06/12/15 0455  NA 136 136 132*  K 4.0 4.6 3.9  CL 99* 101 99*  CO2 27 27 24   GLUCOSE 98 90 125*  BUN 15 15 13   CREATININE 0.69 0.73 0.82  CALCIUM 9.1 8.8* 8.5*   Liver Function Tests:  Recent Labs Lab 06/10/15 1131 06/11/15 0527 06/12/15 0455  AST 25 18 20   ALT 16 15 13*  ALKPHOS 68 60 59  BILITOT 0.7 0.4 0.3  PROT 6.9 6.2* 5.9*  ALBUMIN 3.6 3.2* 2.9*    Recent Labs Lab 06/10/15 1131  LIPASE 38   CBC:  Recent Labs Lab 06/10/15 1131 06/10/15 2101 06/11/15 0527 06/12/15 0455 06/13/15 0458  WBC 9.8 10.7* 8.8 12.0* 10.6*  NEUTROABS 6.4 7.6  --   --   --   HGB 9.7* 9.1* 9.3* 9.2* 9.4*  HCT 29.8* 29.0* 30.0* 29.7* 29.3*  MCV 79.5 79.7 84.5 83.2 79.2  PLT 458* 499* 446* 494* 461*   CBG:  Recent Labs Lab 06/11/15 0754 06/12/15 0806 06/13/15 0729 06/15/15 0729  GLUCAP 96 121* 83 89    Recent Results (from the past 240 hour(s))  Culture, Urine     Status: None   Collection Time: 06/11/15  7:00 PM  Result Value Ref Range Status   Specimen Description URINE, RANDOM  Final   Special Requests NONE  Final   Culture   Final    >=100,000 COLONIES/mL ESCHERICHIA COLI Performed at [facility]    Report Status 06/13/2015 FINAL  Final   Organism ID, Bacteria ESCHERICHIA COLI  Final      Susceptibility   Escherichia coli - MIC*    AMPICILLIN 8 SENSITIVE Sensitive     CEFAZOLIN <=4 SENSITIVE Sensitive      CEFTRIAXONE <=1 SENSITIVE Sensitive     CIPROFLOXACIN <=0.25 SENSITIVE Sensitive     GENTAMICIN <=1 SENSITIVE Sensitive     IMIPENEM <=0.25 SENSITIVE Sensitive     NITROFURANTOIN <=16 SENSITIVE Sensitive     TRIMETH/SULFA <=20 SENSITIVE Sensitive     AMPICILLIN/SULBACTAM 4 SENSITIVE Sensitive     PIP/TAZO <=4 SENSITIVE Sensitive     * >=100,000 COLONIES/mL ESCHERICHIA COLI     Studies: Ct Chest W Contrast  06/13/2015  CLINICAL DATA:  Colonic mass.  Evaluate for metastases. EXAM: CT CHEST, ABDOMEN, AND PELVIS WITH CONTRAST TECHNIQUE: Multidetector CT imaging of the chest, abdomen and pelvis was performed following the standard protocol during bolus administration of intravenous contrast. CONTRAST:  100 mL Isovue-300 COMPARISON:  CT abdomen and pelvis 12/15/2014 FINDINGS: CT CHEST There is mild diffuse enlargement of the thyroid gland, incompletely visualized. No enlarged axillary, mediastinal, or hilar lymph nodes are identified. The heart is borderline enlarged. Three vessel coronary artery calcification and moderate thoracic aortic calcification are noted. There is no pericardial effusion. There are small bilateral pleural effusions which are new from the prior abdominal  CT. Major airways are patent. Evaluation of the lung parenchyma is mildly limited by motion artifact. There is mild centrilobular emphysema. Subsegmental atelectasis is present in both lung bases. There is a new 8 x 5 mm nodule in the right middle lobe (series 4, image 39). Other small lung nodules were not previously imaged and measure 3 mm in the superior segment of the right lower lobe (series 4, image 22), 3 mm in the anterior right upper lobe (series 4, image 18), and 3 mm in the left upper lobe (series 4, image 11). No suspicious lytic or blastic osseous lesions are identified. There is a mild T9 superior endplate compression fracture which is favored to be chronic. CT ABDOMEN AND PELVIS A 9 mm hypodensity in the right hepatic  lobe is unchanged. The gallbladder is surgically absent, and there is mild intrahepatic and extrahepatic biliary dilatation which is slightly less prominent than on the prior study. The spleen, adrenal glands, and pancreas are unremarkable. Low-density right greater than left renal lesions are unchanged and likely represent cysts, with a coarse calcification associated with a cyst in the right interpolar region. A 7 mm mildly hyperdense lesion extending posteriorly from the right upper pole is unchanged. Enhancing 3.0 x 1.8 cm lesion extending anteriorly from the lower pole of the left kidney is unchanged. A duodenal diverticulum is again noted, slightly less distended than on the prior study. Masslike wall thickening along the superior margin of the distal sigmoid colon has mildly enlarged from the prior CT, spanning approximately 7.1 cm in length and measuring approximately 4.4 cm in thickness on sagittal images (series 603, image 61). The sigmoid colon proximal to this is dilated to approximately 7 cm in diameter, containing gas and stool. There is a large amount of stool throughout the more proximal colon. Oral contrast is present in nondilated loops of small bowel as well as in the cecum. There is aortoiliac atherosclerotic calcification without evidence of aneurysm. No free fluid or enlarged lymph nodes are identified. The bladder is unremarkable. The uterus is absent. No suspicious lytic or blastic osseous lesions are identified. Screws are noted in the proximal left femur. IMPRESSION: 1. Enlargement of sigmoid colon mass consistent with malignancy. Dilatation of the more proximal sigmoid colon which may reflect partial obstruction. Large amount of stool throughout the colon. No small bowel dilatation. 2. No evidence of metastatic disease in the abdomen or pelvis. 3. Small lung nodules which are indeterminate, however an 8 mm right middle lobe nodule is new and suspicious for metastasis. 4. Unchanged left  renal mass compatible with renal cell carcinoma. Electronically Signed   By: Logan Bores M.D.   On: 06/13/2015 22:55   Ct Abdomen Pelvis W Contrast  06/13/2015  CLINICAL DATA:  Colonic mass.  Evaluate for metastases. EXAM: CT CHEST, ABDOMEN, AND PELVIS WITH CONTRAST TECHNIQUE: Multidetector CT imaging of the chest, abdomen and pelvis was performed following the standard protocol during bolus administration of intravenous contrast. CONTRAST:  100 mL Isovue-300 COMPARISON:  CT abdomen and pelvis 12/15/2014 FINDINGS: CT CHEST There is mild diffuse enlargement of the thyroid gland, incompletely visualized. No enlarged axillary, mediastinal, or hilar lymph nodes are identified. The heart is borderline enlarged. Three vessel coronary artery calcification and moderate thoracic aortic calcification are noted. There is no pericardial effusion. There are small bilateral pleural effusions which are new from the prior abdominal CT. Major airways are patent. Evaluation of the lung parenchyma is mildly limited by motion artifact. There is mild centrilobular emphysema.  Subsegmental atelectasis is present in both lung bases. There is a new 8 x 5 mm nodule in the right middle lobe (series 4, image 39). Other small lung nodules were not previously imaged and measure 3 mm in the superior segment of the right lower lobe (series 4, image 22), 3 mm in the anterior right upper lobe (series 4, image 18), and 3 mm in the left upper lobe (series 4, image 11). No suspicious lytic or blastic osseous lesions are identified. There is a mild T9 superior endplate compression fracture which is favored to be chronic. CT ABDOMEN AND PELVIS A 9 mm hypodensity in the right hepatic lobe is unchanged. The gallbladder is surgically absent, and there is mild intrahepatic and extrahepatic biliary dilatation which is slightly less prominent than on the prior study. The spleen, adrenal glands, and pancreas are unremarkable. Low-density right greater than  left renal lesions are unchanged and likely represent cysts, with a coarse calcification associated with a cyst in the right interpolar region. A 7 mm mildly hyperdense lesion extending posteriorly from the right upper pole is unchanged. Enhancing 3.0 x 1.8 cm lesion extending anteriorly from the lower pole of the left kidney is unchanged. A duodenal diverticulum is again noted, slightly less distended than on the prior study. Masslike wall thickening along the superior margin of the distal sigmoid colon has mildly enlarged from the prior CT, spanning approximately 7.1 cm in length and measuring approximately 4.4 cm in thickness on sagittal images (series 603, image 61). The sigmoid colon proximal to this is dilated to approximately 7 cm in diameter, containing gas and stool. There is a large amount of stool throughout the more proximal colon. Oral contrast is present in nondilated loops of small bowel as well as in the cecum. There is aortoiliac atherosclerotic calcification without evidence of aneurysm. No free fluid or enlarged lymph nodes are identified. The bladder is unremarkable. The uterus is absent. No suspicious lytic or blastic osseous lesions are identified. Screws are noted in the proximal left femur. IMPRESSION: 1. Enlargement of sigmoid colon mass consistent with malignancy. Dilatation of the more proximal sigmoid colon which may reflect partial obstruction. Large amount of stool throughout the colon. No small bowel dilatation. 2. No evidence of metastatic disease in the abdomen or pelvis. 3. Small lung nodules which are indeterminate, however an 8 mm right middle lobe nodule is new and suspicious for metastasis. 4. Unchanged left renal mass compatible with renal cell carcinoma. Electronically Signed   By: Logan Bores M.D.   On: 06/13/2015 22:55    Scheduled Meds: . benazepril  40 mg Oral Daily  . bisoprolol  5 mg Oral Daily  . cefTRIAXone (ROCEPHIN)  IV  1 g Intravenous Q24H  . imipramine  30  mg Oral QHS  . pantoprazole (PROTONIX) IV  40 mg Intravenous Q12H  . spironolactone  25 mg Oral BID   Continuous Infusions: . lactated ringers 10 mL/hr at 06/14/15 [id number]     Time spent: 25 minutes    Barton Dubois  [facility] Pager [phone]. If 7PM-7AM, please contact night-coverage at [email or web address], password [facility] 06/15/2015, 12:02 PM  LOS: 5 days

## 2015-06-15 NOTE — Consult Note (Signed)
[facility] Consult   Reason for Consult:  Competency Referring Physician:  Dr. Dyann Kief Patient Identification: Natalie [name] MRN:  [id number] Principal Diagnosis: Dementia in Alzheimer's disease Diagnosis:   Patient Active Problem List   Diagnosis Date Noted  . Encephalopathy, metabolic [id number]  . Dementia in Alzheimer's disease [G30.9, F02.80] 06/12/2015  . GI bleed [K92.2] 06/10/2015  . Acute upper GI bleed [K92.2] 06/10/2015  . Diverticulitis of colon [K57.32] 12/01/2014  . UTI (urinary tract infection) [N39.0] 12/01/2014  . GIB (gastrointestinal bleeding) [K92.2] 11/30/2014  . Elevated INR [R79.1] 11/30/2014  . Bleeding gastrointestinal [K92.2]   . Peripheral venous insufficiency [I87.2] 11/26/2012  . Swelling of limb [M79.89] 10/31/2012  . Long term (current) use of anticoagulants [Z79.01] 06/06/2012  . COPD (chronic obstructive pulmonary disease) ([place]) [J44.9] 08/31/2011  . Urinary retention [R33.9] 08/28/2011  . Respiratory failure, extubated 08/15/11 [J96.90] 08/21/2011  . Normal left ventricular systolic function by 2D [id number] [IMO0001] 08/21/2011  . History of gastric ulcer [Z87.19] 07/24/2011  . Renal mass, left [N28.89] 07/21/2011  . Chronic back pain [M54.9, G89.29] 07/21/2011  . Atrial fibrillation, persistent with asymptomatic pauses [I48.1] 07/21/2011  . Warfarin anticoagulation [Z79.01] 07/21/2011  . Anxiety state [F41.1] 08/08/2007  . Depression [F32.9] 08/08/2007  . CHRONIC HEADACHES [Z91.89] 08/08/2007  . Hypothyroidism [E03.9] 01/24/2007  . Essential hypertension [I10] 01/24/2007  . Allergic rhinitis [J30.9] 01/24/2007    Total Time spent with patient: 1 hour  Subjective:   LOURIE KOVATCH is a 82 y.o. female admitted with GI bleed.  HPI:  [name] is a 82 -year-old female seen, chart reviewed for the face-to-face psychiatric consultation and evaluation of competency to make her own medical decisions. Patient  appeared lying in her bed calm and cooperative and trying to reach the food tray with some difficulties. Patient requested to remove the lead and also stated she has been hungry. Patient stated she came to the hospital because she's not feeling well and her doctors reported she has blockages in her intestine and also recommended surgery. Patient reported she denies consent for surgery because she accept her age and death. Patient has a 3 children who has a medical care power of attorney. Patient family reported patient has been suffering with the depression and also significant cognitive deficits. Reportedly patient does not remember anything she spoke and also cannot recognize people after brief while. Patient appeared to be seems to have a intact cognitions except significant memory deficits. Dr. Dyann Kief and patient daughter [name] reported patient has been long refusing colonoscopy even after recommended by multiple family members based on her primary care physician. Patietn family struggle few years to go through colonoscopy which is necessary procedure. Patient does not understand the gravity/severity of complex and complicated medical conditions and risks and benefit of the procedure offered by surgeon and other physician. Patient seems like having hard time to understand and makes statement like giving a sales pitch instead of reasonable discussion about her health decisions. Reportedly family is struggling to care for her and she is not able to care for herself.   Past Psychiatric History: Denied history of psychiatric illness in treatment.  Risk to Self: Is patient at risk for suicide?: No Risk to Others:   Prior Inpatient Therapy:   Prior Outpatient Therapy:    Past Medical History:  Past Medical History  Diagnosis Date  . Osteoporosis   . Hypertension   . Hypothyroidism   . Lower GI bleeding 07/21/11  . Atrial fibrillation ([place])   .  COPD (chronic obstructive pulmonary disease) ([place])   .  Pneumonia   . Shortness of breath 07/21/11    "anytime"  . Sinus headache 07/21/11    "all the time"  . Bladder infection, chronic   . Arthritis     "everywhere"  . Anxiety   . Chronic lower back pain   . Chronic abdominal pain   . Renal mass, left 2013  . PUD (peptic ulcer disease)   . Chronic cystitis   . Trigeminal neuralgia     /E-chart  . Chronic kidney disease   . Anemia     Past Surgical History  Procedure Laterality Date  . Vaginal hysterectomy      partial  . Cataract extraction w/ intraocular lens  implant, bilateral    . Percutaneous pinning femoral neck fracture  11/2003    w/closed reduction ; left/E-chart  . Laceration repair  11/2003    left eyebrow  . Bilateral salpingoophorectomy  12/2005    lap/E-chart  . Cholecystectomy  1960  . Appendectomy      presumed/E-chart  . Esophagogastroduodenoscopy  07/23/2011    Procedure: ESOPHAGOGASTRODUODENOSCOPY (EGD);  Surgeon: Wonda Horner, MD;  Location: [facility] ENDOSCOPY;  Service: Endoscopy;  Laterality: N/A;  . Sigmoidoscopy  06/13/2015    Procedure: SIGMOIDOSCOPY;  Surgeon: Ronald Lobo, MD;  Location: [facility] ENDOSCOPY;  Service: Endoscopy;;   Family History:  Family History  Problem Relation Age of Onset  . Prostate cancer Father     Patient states that his father probably had prostate cancer   Family Psychiatric  History: unknonw Social History:  History  Alcohol Use  . 1.2 oz/week  . 2 Glasses of wine per week    Comment: 07/21/11 "seldom drink"     History  Drug Use No    Social History   Social History  . Marital Status: Widowed    Spouse Name: N/A  . Number of Children: N/A  . Years of Education: N/A   Social History Main Topics  . Smoking status: Former Smoker -- 1.00 packs/day    Types: Cigarettes    Quit date: 09/11/2009  . Smokeless tobacco: Never Used     Comment: 07/21/11 "can't remember when I quit smoking"  . Alcohol Use: 1.2 oz/week    2 Glasses of wine per week     Comment: 07/21/11  "seldom drink"  . Drug Use: No  . Sexual Activity: No   Other Topics Concern  . None   Social History Narrative   Additional Social History:    Allergies:   Allergies  Allergen Reactions  . Levofloxacin Other (See Comments)    REACTION: unspecified per [facility]    Labs:  Results for orders placed or performed during the hospital encounter of 06/10/15 (from the past 48 hour(s))  CEA     Status: Abnormal   Collection Time: 06/14/15  5:02 AM  Result Value Ref Range   CEA 11.4 (H) 0.0 - 4.7 ng/mL    Comment: (NOTE)       Roche ECLIA methodology       Nonsmokers  <3.9                                     Smokers     <5.6 Performed At: [facility] [place], [place] [id number] Lindon Romp MD [id number]   Glucose, capillary     Status: None  Collection Time: 06/15/15  7:29 AM  Result Value Ref Range   Glucose-Capillary 89 65 - 99 mg/dL   Comment 1 Notify RN     Current Facility-Administered Medications  Medication Dose Route Frequency Provider Last Rate Last Dose  . acetaminophen (TYLENOL) tablet 650 mg  650 mg Oral Q6H PRN Robbie Lis, MD   650 mg at 06/14/15 2051   Or  . acetaminophen (TYLENOL) suppository 650 mg  650 mg Rectal Q6H PRN Robbie Lis, MD      . ALPRAZolam Duanne Moron) tablet 0.25 mg  0.25 mg Oral Q8H PRN Robbie Lis, MD   0.25 mg at 06/15/15 1123  . benazepril (LOTENSIN) tablet 40 mg  40 mg Oral Daily Robbie Lis, MD   40 mg at 06/15/15 1124  . bisoprolol (ZEBETA) tablet 5 mg  5 mg Oral Daily Robbie Lis, MD   5 mg at 06/15/15 1123  . cefTRIAXone (ROCEPHIN) 1 g in dextrose 5 % 50 mL IVPB  1 g Intravenous Q24H Simbiso Ranga, MD   1 g at 06/14/15 2051  . imipramine (TOFRANIL) tablet 30 mg  30 mg Oral QHS Barton Dubois, MD   30 mg at 06/14/15 2158  . iohexol (OMNIPAQUE) 300 MG/ML solution 100 mL  100 mL Intravenous Once PRN Barton Dubois, MD      . lactated ringers infusion   Intravenous Continuous Ronald Lobo, MD 10 mL/hr at  06/14/15 [phone]    . ondansetron (ZOFRAN) tablet 4 mg  4 mg Oral Q6H PRN Robbie Lis, MD       Or  . ondansetron [facility]) injection 4 mg  4 mg Intravenous Q6H PRN Robbie Lis, MD      . pantoprazole (PROTONIX) injection 40 mg  40 mg Intravenous Q12H Robbie Lis, MD   40 mg at 06/15/15 1123  . spironolactone (ALDACTONE) tablet 25 mg  25 mg Oral BID Robbie Lis, MD   25 mg at 06/15/15 0820  . traMADol (ULTRAM) tablet 100 mg  100 mg Oral Q8H PRN Simbiso Ranga, MD   100 mg at 06/15/15 1123    Musculoskeletal: Strength & Muscle Tone: decreased Gait & Station: unable to stand Patient leans: N/A  Psychiatric Specialty Exam: ROS generalized weakness, fatigue, significant weight loss and gastrointestinal bleeding. No Fever-chills, No Headache, No changes with Vision or hearing, reports vertigo No problems swallowing food or Liquids, No Chest pain, Cough or Shortness of Breath, No Abdominal pain, No Nausea or Vommitting, Bowel movements are regular, No Blood in stool or Urine, No dysuria, No new skin rashes or bruises, No new joints pains-aches,  No new weakness, tingling, numbness in any extremity, No recent weight gain or loss, No polyuria, polydypsia or polyphagia,  A full 10 point Review of Systems was done, except as stated above, all other Review of Systems were negative.  Blood pressure 136/98, pulse 84, temperature 98.2 F (36.8 C), temperature source Oral, resp. rate 20, height 5\' 2"  (1.575 m), weight 59.3 kg (130 lb 11.7 oz), SpO2 96 %.Body mass index is 23.91 kg/(m^2).  General Appearance: Guarded  Eye Contact::  Fair  Speech:  Clear and Coherent  Volume:  Normal  Mood:  Anxious and Depressed  Affect:  Appropriate and Congruent  Thought Process:  Coherent and Goal Directed  Orientation:  Full (Time, Place, and Person)  Thought Content:  Rumination  Suicidal Thoughts:  No  Homicidal Thoughts:  No  Memory:  Immediate;   Fair  Recent;   Poor  Judgement:  Impaired   Insight:  Shallow  Psychomotor Activity:  Decreased  Concentration:  Fair  Recall:  Poor  Fund of Knowledge:Fair  Language: Good  Akathisia:  Negative  Handed:  Right  AIMS (if indicated):     Assets:  Communication Skills Desire for Improvement Financial Resources/Insurance Housing Leisure Time Social Support Transportation  ADL's:  Impaired  Cognition: Impaired,  Mild  Sleep:      Treatment Plan Summary:  Based on my evaluation today patient does not meet criteria for competency to make her own medical decisions as she is not able to understand complex and complicated medical condition and also not able to discuss reasonably the risk and benefits of the treatment offered to her. Recommended patient family with medical care power of attorney should make decisions after discussing risk and benefits of the procedure and future treatment needs.  Disposition: Supportive therapy provided about ongoing stressors.  Durward Parcel., MD 06/15/2015 12:33 PM

## 2015-06-16 ENCOUNTER — Encounter (HOSPITAL_COMMUNITY): Payer: Self-pay | Admitting: Surgery

## 2015-06-16 DIAGNOSIS — C187 Malignant neoplasm of sigmoid colon: Secondary | ICD-10-CM | POA: Diagnosis present

## 2015-06-16 DIAGNOSIS — K56609 Unspecified intestinal obstruction, unspecified as to partial versus complete obstruction: Secondary | ICD-10-CM | POA: Insufficient documentation

## 2015-06-16 LAB — GLUCOSE, CAPILLARY: GLUCOSE-CAPILLARY: 117 mg/dL — AB (ref 65–99)

## 2015-06-16 MED ORDER — DEXTROSE 5 % IV SOLN
2.0000 g | INTRAVENOUS | Status: AC
  Administered 2015-06-17: 2 g via INTRAVENOUS
  Filled 2015-06-16: qty 2

## 2015-06-16 MED ORDER — BUPIVACAINE LIPOSOME 1.3 % IJ SUSP
20.0000 mL | INTRAMUSCULAR | Status: DC
  Filled 2015-06-16: qty 20

## 2015-06-16 MED ORDER — CHLORHEXIDINE GLUCONATE 4 % EX LIQD
60.0000 mL | Freq: Once | CUTANEOUS | Status: AC
  Administered 2015-06-17: 4 via TOPICAL
  Filled 2015-06-16: qty 60

## 2015-06-16 MED ORDER — ALVIMOPAN 12 MG PO CAPS
12.0000 mg | ORAL_CAPSULE | Freq: Once | ORAL | Status: AC
  Administered 2015-06-17: 12 mg via ORAL
  Filled 2015-06-16: qty 1

## 2015-06-16 MED ORDER — ALPRAZOLAM 0.25 MG PO TABS
0.2500 mg | ORAL_TABLET | Freq: Once | ORAL | Status: AC
  Administered 2015-06-16: 0.25 mg via ORAL
  Filled 2015-06-16: qty 1

## 2015-06-16 MED ORDER — CHLORHEXIDINE GLUCONATE 4 % EX LIQD
60.0000 mL | Freq: Once | CUTANEOUS | Status: AC
  Administered 2015-06-16: 4 via TOPICAL
  Filled 2015-06-16: qty 60

## 2015-06-16 MED ORDER — ENOXAPARIN SODIUM 40 MG/0.4ML ~~LOC~~ SOLN
40.0000 mg | Freq: Once | SUBCUTANEOUS | Status: AC
  Administered 2015-06-17: 40 mg via SUBCUTANEOUS
  Filled 2015-06-16: qty 0.4

## 2015-06-16 NOTE — Consult Note (Signed)
WOC ostomy consult note Received consultation request from [facility] for preoperative stoma site selection in anticipation of surgery that may result in an ostomy scheduled for Monday, 06/17/15. Dr. Johnathan Hausen to operate.  Patient is pleasant, asks why a marking     is being performed, then becomes slightly agitated upon hearing my role and purpose of mark.  Patient states that she does not wish to have surgery. Agrees to marking because it is ink, and because it can be washed off.  This Probation officer explains that it is a "just in case" marking. Patient agrees to marking procedure. Abdomen assessed in the standing and sitting positions as patient is assisted from the bathroom and back to bed. Distention and firmness are present, but patient is non-tender and denies pain. She admits that her abdomen is "usually not this tight". An old and well-remodeled scar is located diagonally in the RUQ resultant from open cholecysctectomy performed approximately 57 years ago. (Patient able to tell me date as it related to her daughter's age: states that her daughter was 1 [age] or 83 years old when she had the surgery and that her daughter is 46 years old now.)  Sites selected are for an ileostomy and a colostomy.  Abdomen cleansed once with a CHG cloth and allowed to dry; patient refuses second cleansing, which is our protocol. I am not able to palpate the rectus muscle due to distention and patient's unwillingness to lie flat in bed (without a pillow) and lift her head and neck to tense the abdominal musculature. The two sites are marked nearer to midline, but not next to, with a surgical skin marking pen and are covered with a thin film transparent dressing.  Ileostomy site selected is located in the RUQ, 7cm to the right of the umbilicus and [id number] above the umbilicus Colostomy site selected is located in the LLQ, 5.5cm to the left of the umbilicus and [id number] below  Education provided: None today.  An ostomy teaching  booklet is left in a closed box in the cupboard over the sink in her room. I will provide education to patient and family post operatively if a stoma is created intraoperatively.  [place] nursing team will not follow, but will remain available to this patient, the nursing, surgical and medical teams.  Please re-consult if a stoma is created on Monday, 3/27. Thanks, Maudie Flakes, MSN, RN, [place], Arther Abbott  Pager# [phone]

## 2015-06-16 NOTE — Progress Notes (Signed)
Offered to assist patient to shower, patient refused stated she wants to go to the shower room down-stair and will not use the one in her room, other staff including the charge nurse reinforced that their is no shower down stairs and she(patient) refused to try to use the shower in her room.

## 2015-06-16 NOTE — Progress Notes (Signed)
[facility] PROGRESS NOTE  Natalie [name] [id number] DOB: 1931/01/27 DOA: 06/10/2015 PCP: Marton Redwood, MD  Summary Natalie [name] is a pleasant 82 -year-old female with past medical history significant for paroxysmal atrial fibrillation on anticoagulation with ?Coumadin/Eliquis, hypertension, depression, Alzheimer's dementia, hypothyroidism who [facility] [address] [facility] for evaluation of ongoing shortness of breath, weakness and reports of melanotic stool for last 1-2 weeks prior to this admission raising concern for symptomatic anemia while patient on anticoagulation. Patient has refused colonoscopy in the past even tough she would benefit from it if source of bleeding can be found to addressed and she can then take anticoagulant without concern for recurrent blood loss anemia. In any case, poor medication compliance makes her non ideal for anticoagulation. That said, patient has been seen by GI, Dr Cristina Gong. Appreciate help. Noted efforts to arrange for colonoscopy Thursday. I discussed with patient's daughter [name] at [phone]. Family is updated on findings from colonoscopy and looking to meet with [facility] to discussed treatment options. Biopsies demonstrating adenocarcinoma and CT staging suggesting lung metastasis. Plan is for total resection with end point colostomy on Monday 3/17.   Plan Acute GI bleed/colonic lesion (with high concerns for bleeding)  Hbg stable  Off anticoagulation  Continue ppi  Following findings from colonoscopy: colonic lesion that appears to be source of bleeding; has been confirmed to be adenocarcinoma by pathology. Given ongoing partial high  Grade obstruction [facility] has been consulted. Staging screening with CT suggesting lung metastasis; CEA pending. Patient has been clear by cardiology and the plan is for tentatively surgery with resection and colostomy on  Monday (3/27)  Dementia in Alzheimer's disease/organic brain dysfunction   Able to  performed ADL's with assistance and even oriented X 2 currently, she is very forgetful and has poor insight. According to Patient's daughter intermittent episodes of disorientation and unable  to be in charge of even taking her medications anymore   Continue supportive care and constant reorientation   Psych consultation to assess for capacity and competency appreciated. Patient found unable to make her own decisions   E. Coli UTI  Pan-sensitive   continue rocephin  currently w/o dysuria  Will aim to treat for 7 days total  No fever and WBC's essentially WNL now  Essential hypertension  Stable and fairly well controlled  will continue current antihypertensive regimen   Anxiety  Continue xanax PRN  Code Status: Full code Family Communication: Daughter [name] by phone. Disposition Plan: anticipating discharge back home with [facility] services when medically stable. Colonoscopy on 3/23 demonstrated colonic lesion with pathology demonstrating adenocarcinoma. Continue supportive care; following [facility] rec's, plan is for resection and colostomy on Monday.    Consultants:  GI  [facility]  Psych   Procedures:  Colonoscopy: 3/23 Likely colon cancer of the rectosigmoid junction region, w/ partial high-grade obstruction.  Antibiotics:  Ceftriaxone 06/11/15>> 3/27  HPI/Subjective: No fever, no nausea, no vomiting. No overt bleeding reported. Denies CP. Reports some insomnia and will like to have a shower. Tolerating CLD.  Objective: Filed Vitals:   06/15/15 2252 06/16/15 0616  BP: 135/96 137/95  Pulse:  94  Temp:  97.9 F (36.6 C)  Resp:  18    Intake/Output Summary (Last 24 hours) at 06/16/15 1117 Last data filed at 06/16/15 [id number]  Gross per 24 hour  Intake   2040 ml  Output      0 ml  Net   2040 ml   Filed Weights   06/14/15 0612 06/15/15 0625 06/16/15  [id number]  Weight: 59.9 kg (132 lb 0.9 oz) 59.3 kg (130 lb 11.7 oz) 57.7 kg (127 lb 3.3 oz)    Exam:   General:   Comfortable at rest; reports no nausea, no vomiting and no further overt bleeding episode. No fever. Patient tolerating CLD and reporting having trouble sleeping last night (3/25). In no acute distress and wants to have a shower   Cardiovascular: S1-S2 normal. No murmurs. Pulse regular.  Respiratory: Good air entry bilaterally. No rhonchi or rales.  Abdomen: Soft and nontender. Normal bowel sounds.   Musculoskeletal: No pedal edema   Neurological:oriented X2; per family at bedside essentially at bedside. Moves all extremities. No focal deficit appreciated  Data Reviewed: Basic Metabolic Panel:  Recent Labs Lab 06/10/15 1131 06/11/15 0527 06/12/15 0455  NA 136 136 132*  K 4.0 4.6 3.9  CL 99* 101 99*  CO2 27 27 24   GLUCOSE 98 90 125*  BUN 15 15 13   CREATININE 0.69 0.73 0.82  CALCIUM 9.1 8.8* 8.5*   Liver Function Tests:  Recent Labs Lab 06/10/15 1131 06/11/15 0527 06/12/15 0455  AST 25 18 20   ALT 16 15 13*  ALKPHOS 68 60 59  BILITOT 0.7 0.4 0.3  PROT 6.9 6.2* 5.9*  ALBUMIN 3.6 3.2* 2.9*    Recent Labs Lab 06/10/15 1131  LIPASE 38   CBC:  Recent Labs Lab 06/10/15 1131 06/10/15 2101 06/11/15 0527 06/12/15 0455 06/13/15 0458  WBC 9.8 10.7* 8.8 12.0* 10.6*  NEUTROABS 6.4 7.6  --   --   --   HGB 9.7* 9.1* 9.3* 9.2* 9.4*  HCT 29.8* 29.0* 30.0* 29.7* 29.3*  MCV 79.5 79.7 84.5 83.2 79.2  PLT 458* 499* 446* 494* 461*   CBG:  Recent Labs Lab 06/11/15 0754 06/12/15 0806 06/13/15 0729 06/15/15 0729 06/16/15 0725  GLUCAP 96 121* 83 89 117*    Recent Results (from the past 240 hour(s))  Culture, Urine     Status: None   Collection Time: 06/11/15  7:00 PM  Result Value Ref Range Status   Specimen Description URINE, RANDOM  Final   Special Requests NONE  Final   Culture   Final    >=100,000 COLONIES/mL ESCHERICHIA COLI Performed at [facility]    Report Status 06/13/2015 FINAL  Final   Organism ID, Bacteria ESCHERICHIA COLI  Final       Susceptibility   Escherichia coli - MIC*    AMPICILLIN 8 SENSITIVE Sensitive     CEFAZOLIN <=4 SENSITIVE Sensitive     CEFTRIAXONE <=1 SENSITIVE Sensitive     CIPROFLOXACIN <=0.25 SENSITIVE Sensitive     GENTAMICIN <=1 SENSITIVE Sensitive     IMIPENEM <=0.25 SENSITIVE Sensitive     NITROFURANTOIN <=16 SENSITIVE Sensitive     TRIMETH/SULFA <=20 SENSITIVE Sensitive     AMPICILLIN/SULBACTAM 4 SENSITIVE Sensitive     PIP/TAZO <=4 SENSITIVE Sensitive     * >=100,000 COLONIES/mL ESCHERICHIA COLI     Studies: No results found.  Scheduled Meds: . benazepril  40 mg Oral Daily  . bisoprolol  5 mg Oral Daily  . cefTRIAXone (ROCEPHIN)  IV  1 g Intravenous Q24H  . imipramine  30 mg Oral QHS  . pantoprazole (PROTONIX) IV  40 mg Intravenous Q12H  . spironolactone  25 mg Oral BID   Continuous Infusions: . lactated ringers 10 mL/hr at 06/14/15 [id number]     Time spent: 25 minutes    Barton Dubois  [facility] Pager [phone]. If 7PM-7AM, please  contact night-coverage at [email or web address], password [facility] 06/16/2015, 11:17 AM  LOS: 6 days

## 2015-06-16 NOTE — Progress Notes (Signed)
Patient ID: Natalie [name], female   DOB: July 14, 1930, 82 y.o.   MRN: [id number]  [facility]  Subjective: Patient in bed, taking clear liquids for breakfast.  Wants to go home.  Denies abd pain.  Objective: Vital signs in last 24 hours: Temp:  [97.9 F (36.6 C)-98.2 F (36.8 C)] 97.9 F (36.6 C) (03/26 0616) Pulse Rate:  [69-98] 94 (03/26 0616) Resp:  [18-20] 18 (03/26 0616) BP: (135-150)/(94-96) 137/95 mmHg (03/26 0616) SpO2:  [94 %-98 %] 95 % (03/26 0616) Weight:  [57.7 kg (127 lb 3.3 oz)] 57.7 kg (127 lb 3.3 oz) (03/26 0616) Last BM Date: 06/15/15  Intake/Output from previous day: 03/25 0701 - 03/26 0700 In: 2040 [P.O.:870; I.V.:1120; IV Piggyback:50] Out: -  Intake/Output this shift:    Physical Exam: HEENT - sclerae clear, mucous membranes moist Neck - soft Abdomen - protuberant, soft, non-tender; BS present Ext - no edema, non-tender  Lab Results:  No results for input(s): WBC, HGB, HCT, PLT in the last 72 hours. BMET No results for input(s): NA, K, CL, CO2, GLUCOSE, BUN, CREATININE, CALCIUM in the last 72 hours. PT/INR No results for input(s): LABPROT, INR in the last 72 hours. Comprehensive Metabolic Panel:    Component Value Date/Time   NA 132* 06/12/2015 0455   NA 136 06/11/2015 0527   K 3.9 06/12/2015 0455   K 4.6 06/11/2015 0527   CL 99* 06/12/2015 0455   CL 101 06/11/2015 0527   CO2 24 06/12/2015 0455   CO2 27 06/11/2015 0527   BUN 13 06/12/2015 0455   BUN 15 06/11/2015 0527   CREATININE 0.82 06/12/2015 0455   CREATININE 0.73 06/11/2015 0527   GLUCOSE 125* 06/12/2015 0455   GLUCOSE 90 06/11/2015 0527   CALCIUM 8.5* 06/12/2015 0455   CALCIUM 8.8* 06/11/2015 0527   AST 20 06/12/2015 0455   AST 18 06/11/2015 0527   ALT 13* 06/12/2015 0455   ALT 15 06/11/2015 0527   ALKPHOS 59 06/12/2015 0455   ALKPHOS 60 06/11/2015 0527   BILITOT 0.3 06/12/2015 0455   BILITOT 0.4 06/11/2015 0527   PROT 5.9* 06/12/2015  0455   PROT 6.2* 06/11/2015 0527   ALBUMIN 2.9* 06/12/2015 0455   ALBUMIN 3.2* 06/11/2015 0527    Studies/Results: No results found.  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 Kaylyn Lim Will follow over weekend and discuss further with family and medical team  Pre-op orders entered and case scheduled with OR for Monday.  Earnstine Regal, MD, [facility] Office: [place] 06/16/2015

## 2015-06-16 NOTE — Progress Notes (Signed)
Continues to be very anxious and requesting more Xanax.   Last xanax was at 2030.  All comfort measure done frequently.  Contacted on call and have received a one time order for 0.25 po Xanax.

## 2015-06-17 ENCOUNTER — Encounter (HOSPITAL_COMMUNITY)
Admission: EM | Disposition: A | Discharge status: Skilled Nursing Facility | Payer: Self-pay | Source: Home / Self Care | Attending: Internal Medicine

## 2015-06-17 ENCOUNTER — Inpatient Hospital Stay (HOSPITAL_COMMUNITY): Payer: Medicare Other | Admitting: Anesthesiology

## 2015-06-17 ENCOUNTER — Encounter (HOSPITAL_COMMUNITY): Payer: Self-pay | Admitting: Surgery

## 2015-06-17 DIAGNOSIS — F411 Generalized anxiety disorder: Secondary | ICD-10-CM

## 2015-06-17 HISTORY — PX: COLECTOMY WITH COLOSTOMY CREATION/HARTMANN PROCEDURE: SHX6598

## 2015-06-17 LAB — CBC
HEMATOCRIT: 33 % — AB (ref 36.0–46.0)
HEMOGLOBIN: 10.5 g/dL — AB (ref 12.0–15.0)
MCH: 25.5 pg — ABNORMAL LOW (ref 26.0–34.0)
MCHC: 31.8 g/dL (ref 30.0–36.0)
MCV: 80.1 fL (ref 78.0–100.0)
Platelets: 602 10*3/uL — ABNORMAL HIGH (ref 150–400)
RBC: 4.12 MIL/uL (ref 3.87–5.11)
RDW: 15.3 % (ref 11.5–15.5)
WBC: 13.3 10*3/uL — AB (ref 4.0–10.5)

## 2015-06-17 LAB — GLUCOSE, CAPILLARY: GLUCOSE-CAPILLARY: 102 mg/dL — AB (ref 65–99)

## 2015-06-17 LAB — TYPE AND SCREEN
ABO/RH(D): B POS
Antibody Screen: NEGATIVE

## 2015-06-17 LAB — BASIC METABOLIC PANEL
ANION GAP: 10 (ref 5–15)
BUN: 10 mg/dL (ref 6–20)
CHLORIDE: 93 mmol/L — AB (ref 101–111)
CO2: 25 mmol/L (ref 22–32)
Calcium: 9.1 mg/dL (ref 8.9–10.3)
Creatinine, Ser: 0.82 mg/dL (ref 0.44–1.00)
GFR calc non Af Amer: >60 mL/min (ref >60)
Glucose, Bld: 101 mg/dL — ABNORMAL HIGH (ref 65–99)
POTASSIUM: 4.5 mmol/L (ref 3.5–5.1)
SODIUM: 128 mmol/L — AB (ref 135–145)

## 2015-06-17 LAB — HEMOGLOBIN A1C
HEMOGLOBIN A1C: 6.2 % — AB (ref 4.8–5.6)
MEAN PLASMA GLUCOSE: 131 mg/dL

## 2015-06-17 LAB — SURGICAL PCR SCREEN
MRSA, PCR: NEGATIVE
STAPHYLOCOCCUS AUREUS: NEGATIVE

## 2015-06-17 LAB — SAMPLE TO BLOOD BANK

## 2015-06-17 SURGERY — COLECTOMY, WITH COLOSTOMY CREATION
Anesthesia: General | Site: Abdomen

## 2015-06-17 MED ORDER — ROCURONIUM BROMIDE 100 MG/10ML IV SOLN
INTRAVENOUS | Status: AC
  Filled 2015-06-17: qty 1

## 2015-06-17 MED ORDER — FENTANYL CITRATE (PF) 250 MCG/5ML IJ SOLN
INTRAMUSCULAR | Status: AC
  Filled 2015-06-17: qty 5

## 2015-06-17 MED ORDER — METOPROLOL TARTRATE 1 MG/ML IV SOLN
5.0000 mg | Freq: Three times a day (TID) | INTRAVENOUS | Status: DC
  Administered 2015-06-17 – 2015-06-25 (×24): 5 mg via INTRAVENOUS
  Filled 2015-06-17 (×23): qty 5

## 2015-06-17 MED ORDER — SUGAMMADEX SODIUM 200 MG/2ML IV SOLN
INTRAVENOUS | Status: DC | PRN
  Administered 2015-06-17: 200 mg via INTRAVENOUS

## 2015-06-17 MED ORDER — ONDANSETRON HCL 4 MG/2ML IJ SOLN
INTRAMUSCULAR | Status: AC
  Filled 2015-06-17: qty 2

## 2015-06-17 MED ORDER — METOPROLOL TARTRATE 1 MG/ML IV SOLN
5.0000 mg | Freq: Three times a day (TID) | INTRAVENOUS | Status: DC
  Filled 2015-06-17: qty 5

## 2015-06-17 MED ORDER — FENTANYL CITRATE (PF) 100 MCG/2ML IJ SOLN
25.0000 ug | INTRAMUSCULAR | Status: DC | PRN
  Administered 2015-06-17: 50 ug via INTRAVENOUS

## 2015-06-17 MED ORDER — BUPIVACAINE-EPINEPHRINE (PF) 0.25% -1:200000 IJ SOLN
INTRAMUSCULAR | Status: AC
  Filled 2015-06-17: qty 30

## 2015-06-17 MED ORDER — HEPARIN SODIUM (PORCINE) 5000 UNIT/ML IJ SOLN
5000.0000 [IU] | Freq: Three times a day (TID) | INTRAMUSCULAR | Status: DC
  Administered 2015-06-18 – 2015-06-25 (×23): 5000 [IU] via SUBCUTANEOUS
  Filled 2015-06-17 (×25): qty 1

## 2015-06-17 MED ORDER — ONDANSETRON HCL 4 MG PO TABS: 4.0000 mg | ORAL_TABLET | Freq: Four times a day (QID) | ORAL | Status: DC | PRN

## 2015-06-17 MED ORDER — 0.9 % SODIUM CHLORIDE (POUR BTL) OPTIME
TOPICAL | Status: DC | PRN
  Administered 2015-06-17: 1000 mL

## 2015-06-17 MED ORDER — CEFOTETAN DISODIUM-DEXTROSE 2-2.08 GM-% IV SOLR
INTRAVENOUS | Status: AC
  Filled 2015-06-17: qty 50

## 2015-06-17 MED ORDER — LORAZEPAM 2 MG/ML IJ SOLN
0.2500 mg | INTRAMUSCULAR | Status: DC | PRN
  Administered 2015-06-17 – 2015-06-24 (×15): 0.25 mg via INTRAVENOUS
  Filled 2015-06-17 (×16): qty 1

## 2015-06-17 MED ORDER — MORPHINE SULFATE (PF) 2 MG/ML IV SOLN
1.0000 mg | INTRAVENOUS | Status: DC | PRN
  Administered 2015-06-17 – 2015-06-25 (×32): 1 mg via INTRAVENOUS
  Filled 2015-06-17 (×33): qty 1

## 2015-06-17 MED ORDER — BUPIVACAINE-EPINEPHRINE 0.25% -1:200000 IJ SOLN
INTRAMUSCULAR | Status: DC | PRN
  Administered 2015-06-17: 30 mL

## 2015-06-17 MED ORDER — METOPROLOL TARTRATE 1 MG/ML IV SOLN
INTRAVENOUS | Status: DC | PRN
  Administered 2015-06-17 (×2): 2 mg via INTRAVENOUS
  Administered 2015-06-17: 1 mg via INTRAVENOUS

## 2015-06-17 MED ORDER — METOPROLOL TARTRATE 1 MG/ML IV SOLN
INTRAVENOUS | Status: AC
  Filled 2015-06-17: qty 5

## 2015-06-17 MED ORDER — SODIUM CHLORIDE 0.9 % IV SOLN
INTRAVENOUS | Status: DC | PRN
  Administered 2015-06-17: 12:00:00 via INTRAVENOUS

## 2015-06-17 MED ORDER — LABETALOL HCL 5 MG/ML IV SOLN
10.0000 mg | INTRAVENOUS | Status: DC | PRN
  Administered 2015-06-17: 10 mg via INTRAVENOUS
  Filled 2015-06-17 (×2): qty 4

## 2015-06-17 MED ORDER — HYDRALAZINE HCL 20 MG/ML IJ SOLN: 10.0000 mg | Freq: Three times a day (TID) | INTRAMUSCULAR | Status: DC | PRN

## 2015-06-17 MED ORDER — PROPOFOL 10 MG/ML IV BOLUS
INTRAVENOUS | Status: AC
  Filled 2015-06-17: qty 20

## 2015-06-17 MED ORDER — LACTATED RINGERS IR SOLN
Status: DC | PRN
  Administered 2015-06-17: 1000 mL

## 2015-06-17 MED ORDER — DEXTROSE-NACL 5-0.9 % IV SOLN
INTRAVENOUS | Status: DC
  Administered 2015-06-17: 09:00:00 via INTRAVENOUS

## 2015-06-17 MED ORDER — KCL IN DEXTROSE-NACL 20-5-0.45 MEQ/L-%-% IV SOLN
INTRAVENOUS | Status: DC
  Administered 2015-06-17 – 2015-06-24 (×13): via INTRAVENOUS
  Filled 2015-06-17 (×18): qty 1000

## 2015-06-17 MED ORDER — LIDOCAINE HCL (CARDIAC) 20 MG/ML IV SOLN
INTRAVENOUS | Status: AC
  Filled 2015-06-17: qty 5

## 2015-06-17 MED ORDER — PHENYLEPHRINE 40 MCG/ML (10ML) SYRINGE FOR IV PUSH (FOR BLOOD PRESSURE SUPPORT)
PREFILLED_SYRINGE | INTRAVENOUS | Status: AC
  Filled 2015-06-17: qty 10

## 2015-06-17 MED ORDER — ONDANSETRON HCL 4 MG/2ML IJ SOLN
INTRAMUSCULAR | Status: DC | PRN
  Administered 2015-06-17: 4 mg via INTRAVENOUS

## 2015-06-17 MED ORDER — ONDANSETRON HCL 4 MG/2ML IJ SOLN
4.0000 mg | Freq: Four times a day (QID) | INTRAMUSCULAR | Status: DC | PRN
  Administered 2015-06-20 – 2015-06-24 (×3): 4 mg via INTRAVENOUS
  Filled 2015-06-17 (×3): qty 2

## 2015-06-17 MED ORDER — VITAMINS A & D EX OINT
TOPICAL_OINTMENT | CUTANEOUS | Status: AC
  Administered 2015-06-17: 5
  Filled 2015-06-17: qty 5

## 2015-06-17 MED ORDER — MORPHINE SULFATE (PF) 2 MG/ML IV SOLN: 0.5000 mg | INTRAVENOUS | Status: DC | PRN

## 2015-06-17 MED ORDER — FENTANYL CITRATE (PF) 100 MCG/2ML IJ SOLN
INTRAMUSCULAR | Status: DC | PRN
  Administered 2015-06-17 (×5): 50 ug via INTRAVENOUS

## 2015-06-17 MED ORDER — PROMETHAZINE HCL 25 MG/ML IJ SOLN: 6.2500 mg | INTRAMUSCULAR | Status: DC | PRN

## 2015-06-17 MED ORDER — SUCCINYLCHOLINE CHLORIDE 20 MG/ML IJ SOLN
INTRAMUSCULAR | Status: DC | PRN
  Administered 2015-06-17: 100 mg via INTRAVENOUS

## 2015-06-17 MED ORDER — PROPOFOL 10 MG/ML IV BOLUS
INTRAVENOUS | Status: DC | PRN
  Administered 2015-06-17: 100 mg via INTRAVENOUS

## 2015-06-17 MED ORDER — PHENYLEPHRINE HCL 10 MG/ML IJ SOLN
INTRAMUSCULAR | Status: DC | PRN
  Administered 2015-06-17 (×2): 40 ug via INTRAVENOUS

## 2015-06-17 MED ORDER — FENTANYL CITRATE (PF) 100 MCG/2ML IJ SOLN
INTRAMUSCULAR | Status: AC
  Filled 2015-06-17: qty 2

## 2015-06-17 MED ORDER — SUGAMMADEX SODIUM 200 MG/2ML IV SOLN
INTRAVENOUS | Status: AC
  Filled 2015-06-17: qty 2

## 2015-06-17 MED ORDER — ROCURONIUM BROMIDE 100 MG/10ML IV SOLN
INTRAVENOUS | Status: DC | PRN
  Administered 2015-06-17: 50 mg via INTRAVENOUS
  Administered 2015-06-17: 10 mg via INTRAVENOUS

## 2015-06-17 MED ORDER — LIDOCAINE HCL (CARDIAC) 20 MG/ML IV SOLN
INTRAVENOUS | Status: DC | PRN
  Administered 2015-06-17: 50 mg via INTRAVENOUS

## 2015-06-17 SURGICAL SUPPLY — 98 items
ADH SKN CLS APL DERMABOND .7 (GAUZE/BANDAGES/DRESSINGS) ×2
APPLICATOR COTTON TIP 6IN STRL (MISCELLANEOUS) ×2 IMPLANT
APPLIER CLIP 5 13 M/L LIGAMAX5 (MISCELLANEOUS)
APPLIER CLIP ROT 10 11.4 M/L (STAPLE)
APR CLP MED LRG 11.4X10 (STAPLE)
APR CLP MED LRG 5 ANG JAW (MISCELLANEOUS)
BAG URINE DRAINAGE (UROLOGICAL SUPPLIES) IMPLANT
BAG URO CATCHER STRL LF (MISCELLANEOUS) IMPLANT
BLADE EXTENDED COATED 6.5IN (ELECTRODE) ×1 IMPLANT
BLADE HEX COATED 2.75 (ELECTRODE) ×4 IMPLANT
BLADE SURG SZ10 CARB STEEL (BLADE) ×1 IMPLANT
CABLE HIGH FREQUENCY MONO STRZ (ELECTRODE) ×4 IMPLANT
CATH FOLEY SILVER 30CC 28FR (CATHETERS) IMPLANT
CELLS DAT CNTRL 66122 CELL SVR (MISCELLANEOUS) IMPLANT
CHLORAPREP W/TINT 26ML (MISCELLANEOUS) ×1 IMPLANT
CLIP APPLIE 5 13 M/L LIGAMAX5 (MISCELLANEOUS) ×1 IMPLANT
CLIP APPLIE ROT 10 11.4 M/L (STAPLE) ×1 IMPLANT
CLIP TI LARGE 6 (CLIP) IMPLANT
COVER MAYO STAND STRL (DRAPES) ×1 IMPLANT
COVER SURGICAL LIGHT HANDLE (MISCELLANEOUS) ×1 IMPLANT
DECANTER SPIKE VIAL GLASS SM (MISCELLANEOUS) ×1 IMPLANT
DERMABOND ADVANCED (GAUZE/BANDAGES/DRESSINGS) ×2
DERMABOND ADVANCED .7 DNX12 (GAUZE/BANDAGES/DRESSINGS) ×1 IMPLANT
DRAIN CHANNEL 19F RND (DRAIN) ×4 IMPLANT
DRAIN PENROSE 18X1/2 LTX STRL (DRAIN) ×3 IMPLANT
DRAPE LAPAROSCOPIC ABDOMINAL (DRAPES) ×4 IMPLANT
DRAPE SHEET LG 3/4 BI-LAMINATE (DRAPES) IMPLANT
DRAPE WARM FLUID 44X44 (DRAPE) ×1 IMPLANT
ELECT REM PT RETURN 9FT ADLT (ELECTROSURGICAL) ×4
ELECTRODE REM PT RTRN 9FT ADLT (ELECTROSURGICAL) ×2 IMPLANT
FILTER SMOKE EVAC LAPAROSHD (FILTER) IMPLANT
GAUZE SPONGE 4X4 12PLY STRL (GAUZE/BANDAGES/DRESSINGS) ×1 IMPLANT
GLOVE BIOGEL M 8.0 STRL (GLOVE) ×4 IMPLANT
GLOVE BIOGEL PI IND STRL 7.0 (GLOVE) ×2 IMPLANT
GLOVE BIOGEL PI INDICATOR 7.0 (GLOVE) ×2
GLOVE ECLIPSE 8.0 STRL XLNG CF (GLOVE) ×8 IMPLANT
GLOVE INDICATOR 8.0 STRL GRN (GLOVE) ×4 IMPLANT
GOWN SPEC L4 XLG W/TWL (GOWN DISPOSABLE) ×1 IMPLANT
GOWN STRL REUS W/TWL LRG LVL3 (GOWN DISPOSABLE) ×1 IMPLANT
GOWN STRL REUS W/TWL XL LVL3 (GOWN DISPOSABLE) ×12 IMPLANT
HANDLE SUCTION POOLE (INSTRUMENTS) ×1 IMPLANT
HOLDER FOLEY CATH W/STRAP (MISCELLANEOUS) ×3 IMPLANT
KIT BASIN OR (CUSTOM PROCEDURE TRAY) ×4 IMPLANT
LEGGING LITHOTOMY PAIR STRL (DRAPES) ×1 IMPLANT
LIGASURE IMPACT 36 18CM CVD LR (INSTRUMENTS) IMPLANT
LOOP OSTOMY BRIDGE (OSTOMY) ×3 IMPLANT
NS IRRIG 1000ML POUR BTL (IV SOLUTION) ×8 IMPLANT
PACK COLON (CUSTOM PROCEDURE TRAY) ×4 IMPLANT
PACK GENERAL/GYN (CUSTOM PROCEDURE TRAY) ×1 IMPLANT
PAD POSITIONING PINK XL (MISCELLANEOUS) ×3 IMPLANT
PADDING ION DISPOSABLE ARM LT (MISCELLANEOUS) ×3 IMPLANT
PUMP PAIN ON-Q (MISCELLANEOUS) ×1 IMPLANT
RETRACTOR WND ALEXIS 18 MED (MISCELLANEOUS) IMPLANT
RTRCTR WOUND ALEXIS 18CM MED (MISCELLANEOUS)
SCISSORS LAP 5X35 DISP (ENDOMECHANICALS) ×4 IMPLANT
SCRUB PCMX 4 OZ (MISCELLANEOUS) ×1 IMPLANT
SET IRRIG TUBING LAPAROSCOPIC (IRRIGATION / IRRIGATOR) ×4 IMPLANT
SHEARS HARMONIC ACE PLUS 36CM (ENDOMECHANICALS) IMPLANT
SOLUTION ANTI FOG 6CC (MISCELLANEOUS) ×1 IMPLANT
STAPLER VISISTAT 35W (STAPLE) ×4 IMPLANT
SUCTION POOLE HANDLE (INSTRUMENTS)
SUT CHROMIC 3 0 SH 27 (SUTURE) ×3 IMPLANT
SUT ETHILON 2 0 PS N (SUTURE) ×3 IMPLANT
SUT NOV 1 T60/GS (SUTURE) IMPLANT
SUT NOVA T20/GS 25 (SUTURE) ×2 IMPLANT
SUT PDS AB 1 CTX 36 (SUTURE) ×2 IMPLANT
SUT PDS AB 1 TP1 54 (SUTURE) IMPLANT
SUT PDS AB 1 TP1 96 (SUTURE) IMPLANT
SUT PROLENE 2 0 KS (SUTURE) IMPLANT
SUT SILK 2 0 (SUTURE)
SUT SILK 2 0 SH CR/8 (SUTURE) ×4 IMPLANT
SUT SILK 2 0SH CR/8 30 (SUTURE) IMPLANT
SUT SILK 2-0 18XBRD TIE 12 (SUTURE) ×1 IMPLANT
SUT SILK 2-0 30XBRD TIE 12 (SUTURE) IMPLANT
SUT SILK 3 0 (SUTURE)
SUT SILK 3 0 SH CR/8 (SUTURE) ×1 IMPLANT
SUT SILK 3-0 18XBRD TIE 12 (SUTURE) ×1 IMPLANT
SUT VIC AB 2-0 SH 18 (SUTURE) ×2 IMPLANT
SUT VIC AB 3-0 SH 18 (SUTURE) ×3 IMPLANT
SUT VIC AB 4-0 SH 18 (SUTURE) ×3 IMPLANT
SUT VICRYL 2 0 18  UND BR (SUTURE)
SUT VICRYL 2 0 18 UND BR (SUTURE) ×2 IMPLANT
SYR 30ML LL (SYRINGE) IMPLANT
SYRINGE IRR TOOMEY STRL 70CC (SYRINGE) IMPLANT
SYS LAPSCP GELPORT 120MM (MISCELLANEOUS)
SYSTEM LAPSCP GELPORT 120MM (MISCELLANEOUS) IMPLANT
TOWEL OR 17X26 10 PK STRL BLUE (TOWEL DISPOSABLE) ×4 IMPLANT
TRAY FOLEY W/METER SILVER 14FR (SET/KITS/TRAYS/PACK) ×4 IMPLANT
TRAY FOLEY W/METER SILVER 16FR (SET/KITS/TRAYS/PACK) ×1 IMPLANT
TROCAR BLADELESS OPT 5 100 (ENDOMECHANICALS) ×8 IMPLANT
TROCAR BLADELESS OPT 5 75 (ENDOMECHANICALS) ×3 IMPLANT
TROCAR SLEEVE XCEL 5X75 (ENDOMECHANICALS) ×3 IMPLANT
TROCAR XCEL 12X100 BLDLESS (ENDOMECHANICALS) ×3 IMPLANT
TROCAR XCEL NON-BLD 11X100MML (ENDOMECHANICALS) ×4 IMPLANT
TROCAR XCEL UNIV SLVE 11M 100M (ENDOMECHANICALS) IMPLANT
TUBING INSUF HEATED (TUBING) ×4 IMPLANT
WATER STERILE IRR 1500ML POUR (IV SOLUTION) IMPLANT
YANKAUER SUCT BULB TIP NO VENT (SUCTIONS) ×1 IMPLANT

## 2015-06-17 NOTE — Anesthesia Procedure Notes (Signed)
Procedure Name: Intubation Date/Time: 06/17/2015 12:46 PM Performed by: Deliah Boston Pre-anesthesia Checklist: Patient identified, Emergency Drugs available, Suction available and Patient being monitored Patient Re-evaluated:Patient Re-evaluated prior to inductionOxygen Delivery Method: Circle System Utilized Preoxygenation: Pre-oxygenation with 100% oxygen Intubation Type: IV induction, Rapid sequence and Cricoid Pressure applied Laryngoscope Size: Glidescope and 3 Grade View: Grade I Tube type: Parker flex tip Tube size: 7.0 mm Number of attempts: 1 Airway Equipment and Method: Oral airway,  Video-laryngoscopy and Rigid stylet Placement Confirmation: ETT inserted through vocal cords under direct vision,  positive ETCO2 and breath sounds checked- equal and bilateral Secured at: 21 cm Tube secured with: Tape Dental Injury: Teeth and Oropharynx as per pre-operative assessment  Difficulty Due To: Difficulty was anticipated, Difficult Airway- due to reduced neck mobility, Difficult Airway- due to limited oral opening and Difficult Airway- due to dentition

## 2015-06-17 NOTE — Progress Notes (Signed)
[facility] PROGRESS NOTE  Natalie [name] [id number] DOB: [birth year] DOA: 06/10/2015 PCP: Marton Redwood, MD  Summary Natalie [name] is a pleasant 82 -year-old female with past medical history significant for paroxysmal atrial fibrillation on anticoagulation with ?Coumadin/Eliquis, hypertension, depression, Alzheimer's dementia, hypothyroidism who presented to [facility] hospital for evaluation of ongoing shortness of breath, weakness and reports of melanotic stool for last 1-2 weeks prior to this admission raising concern for symptomatic anemia while patient on anticoagulation. Patient has refused colonoscopy in the past even tough she would benefit from it if source of bleeding can be found to addressed and she can then take anticoagulant without concern for recurrent blood loss anemia. In any case, poor medication compliance makes her non ideal for anticoagulation. That said, patient has been seen by GI, Dr Cristina Gong. Appreciate help. Noted efforts to arrange for colonoscopy Thursday. I discussed with patient's daughter Natalie [name] at [phone]. Family is updated on findings from colonoscopy and looking to meet with [facility] to discussed treatment options. Biopsies demonstrating adenocarcinoma and CT staging suggesting lung metastasis. Plan is for total resection with end point colostomy later today 3/27.   Plan Acute GI bleed/colonic lesion (with high concerns for bleeding)  Hbg stable  Off anticoagulation  Continue ppi  Following findings from colonoscopy: colonic lesion that appears to be source of bleeding; has been confirmed to be adenocarcinoma by pathology. Given ongoing partial high  Grade obstruction [facility] has been consulted. Staging screening with CT suggesting lung metastasis; CEA pending. 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 According to Patient's daughter intermittent episodes of disorientation and unable  to be in charge of even taking her medications anymore   Continue supportive care and constant reorientation   Psych consultation to assess for capacity and competency appreciated. Patient found unable to make her own decisions   E. Coli UTI  Pan-sensitive   continue rocephin  currently w/o dysuria  Will aim to treat for 7 days total  No fever and WBC's essentially WNL now  Essential hypertension  Stable and fairly well controlled  will continue current antihypertensive regimen   Anxiety  Continue xanax PRN  Code Status: Full code Family Communication: Daughter Natalie [name] by phone. Disposition Plan: anticipating discharge back home with [facility] services when medically stable. Colonoscopy on 3/23 demonstrated colonic lesion with pathology demonstrating adenocarcinoma. Continue supportive care; following [facility] rec's, plan is for resection and colostomy on later today (3/27)    Consultants:  GI  [facility]  Psych   Procedures:  Colonoscopy: 3/23 Likely colon cancer of the rectosigmoid junction region, w/ partial high-grade obstruction.  Antibiotics:  Ceftriaxone 06/11/15>> 3/27  HPI/Subjective: No fever, no nausea, no vomiting. No further bleeding reported. Denies CP. In no distress.  Objective: Filed Vitals:   06/16/15 2130 06/17/15 0520  BP: 166/98 129/87  Pulse: 95 100  Temp: 98.1 F (36.7 C) 98 F (36.7 C)  Resp: 16 20    Intake/Output Summary (Last 24 hours) at 06/17/15 1216 Last data filed at 06/17/15 0904  Gross per 24 hour  Intake    360 ml  Output    125 ml  Net    235 ml   Filed Weights   06/15/15 0625 06/16/15 0616 06/17/15 0520  Weight: 59.3 kg (  130 lb 11.7 oz) 57.7 kg (127 lb 3.3 oz) 57.4 kg (126 lb 8.7 oz)    Exam:   General:  Comfortable at rest;  afebrile and in no distress. Surgery planned for later today (3/27)   Cardiovascular: S1-S2 normal. No murmurs. Pulse regular.  Respiratory: Good air entry bilaterally. No rhonchi or rales.  Abdomen: Soft and nontender. Normal bowel sounds.   Musculoskeletal: No pedal edema   Neurological:oriented X2; per family at bedside essentially at bedside. Moves all extremities. No focal deficit appreciated  Data Reviewed: Basic Metabolic Panel:  Recent Labs Lab 06/11/15 0527 06/12/15 0455 06/17/15 0523  NA 136 132* 128*  K 4.6 3.9 4.5  CL 101 99* 93*  CO2 27 24 25   GLUCOSE 90 125* 101*  BUN 15 13 10   CREATININE 0.73 0.82 0.82  CALCIUM 8.8* 8.5* 9.1   Liver Function Tests:  Recent Labs Lab 06/11/15 0527 06/12/15 0455  AST 18 20  ALT 15 13*  ALKPHOS 60 59  BILITOT 0.4 0.3  PROT 6.2* 5.9*  ALBUMIN 3.2* 2.9*   No results for input(s): LIPASE, AMYLASE in the last 168 hours. CBC:  Recent Labs Lab 06/10/15 2101 06/11/15 0527 06/12/15 0455 06/13/15 0458 06/17/15 0523  WBC 10.7* 8.8 12.0* 10.6* 13.3*  NEUTROABS 7.6  --   --   --   --   HGB 9.1* 9.3* 9.2* 9.4* 10.5*  HCT 29.0* 30.0* 29.7* 29.3* 33.0*  MCV 79.7 84.5 83.2 79.2 80.1  PLT 499* 446* 494* 461* 602*   CBG:  Recent Labs Lab 06/12/15 0806 06/13/15 0729 06/15/15 0729 06/16/15 0725 06/17/15 0524  GLUCAP 121* 83 89 117* 102*    Recent Results (from the past 240 hour(s))  Culture, Urine     Status: None   Collection Time: 06/11/15  7:00 PM  Result Value Ref Range Status   Specimen Description URINE, RANDOM  Final   Special Requests NONE  Final   Culture   Final    >=100,000 COLONIES/mL ESCHERICHIA COLI Performed at [facility]    Report Status 06/13/2015 FINAL  Final   Organism ID, Bacteria ESCHERICHIA COLI  Final      Susceptibility   Escherichia coli - MIC*    AMPICILLIN 8 SENSITIVE Sensitive     CEFAZOLIN <=4 SENSITIVE Sensitive     CEFTRIAXONE <=1 SENSITIVE Sensitive      CIPROFLOXACIN <=0.25 SENSITIVE Sensitive     GENTAMICIN <=1 SENSITIVE Sensitive     IMIPENEM <=0.25 SENSITIVE Sensitive     NITROFURANTOIN <=16 SENSITIVE Sensitive     TRIMETH/SULFA <=20 SENSITIVE Sensitive     AMPICILLIN/SULBACTAM 4 SENSITIVE Sensitive     PIP/TAZO <=4 SENSITIVE Sensitive     * >=100,000 COLONIES/mL ESCHERICHIA COLI  Surgical pcr screen     Status: None   Collection Time: 06/17/15  6:42 AM  Result Value Ref Range Status   MRSA, PCR NEGATIVE NEGATIVE Final   Staphylococcus aureus NEGATIVE NEGATIVE Final    Comment:        The Xpert SA Assay (FDA approved for NASAL specimens in patients over 104 years of age), is one component of a comprehensive surveillance program.  Test performance has been validated by [facility] for patients greater than or equal to 21 year old. It is not intended to diagnose infection nor to guide or monitor treatment.      Studies: No results found.  Scheduled Meds: . [MAR Hold] benazepril  40 mg Oral Daily  . [facility] Hold]  bisoprolol  5 mg Oral Daily  . [MAR Hold] bupivacaine liposome  20 mL Infiltration On Call to OR  . cefoTEtan (CEFOTAN) 2 GM IVPB  2 g Intravenous On Call to OR  . [MAR Hold] cefTRIAXone (ROCEPHIN)  IV  1 g Intravenous Q24H  . [MAR Hold] imipramine  30 mg Oral QHS  . [MAR Hold] pantoprazole (PROTONIX) IV  40 mg Intravenous Q12H  . [MAR Hold] spironolactone  25 mg Oral BID   Continuous Infusions: . dextrose 5 % and 0.9% NaCl 75 mL/hr at 06/17/15 0842  . lactated ringers 10 mL/hr at 06/14/15 [id number]     Time spent: 25 minutes    Barton Dubois  [facility] Pager [phone]. If 7PM-7AM, please contact night-coverage at [email or web address], password [facility] 06/17/2015, 12:16 PM  LOS: 7 days

## 2015-06-17 NOTE — Transfer of Care (Signed)
Immediate Anesthesia Transfer of Care Note  Patient: Natalie [name]  Procedure(s) Performed: Procedure(s): LAPAROSCOPY LOOP COLOSTOMY (N/A)  Patient Location: PACU  Anesthesia Type:General  Level of Consciousness: Patient easily awoken, sedated, comfortable, cooperative, following commands, responds to stimulation.   Airway & Oxygen Therapy: Patient spontaneously breathing, ventilating well, oxygen via simple oxygen mask.  Post-op Assessment: Report given to PACU RN, vital signs reviewed and stable, moving all extremities.   Post vital signs: Reviewed and stable.  Complications: No apparent anesthesia complications

## 2015-06-17 NOTE — Progress Notes (Signed)
Received report from [facility]. This RN agrees with pm assessment. Patient resting comfortably. Will continue to monitor closely.

## 2015-06-17 NOTE — Progress Notes (Signed)
Patient ID: Natalie [name], female   DOB: [birth year], 82 y.o.   MRN: [id number]     [address] [place], [place] [address]    Phone: [phone] FAX: [phone]     Subjective: No n/v.  C/o abd pain.  Unaware of colon mass.  Has dementia.  Afebrile.  VSS.    Objective:  Vital signs:  Filed Vitals:   06/16/15 0616 06/16/15 1304 06/16/15 2130 06/17/15 0520  BP: 137/95 141/90 166/98 129/87  Pulse: 94 90 95 100  Temp: 97.9 F (36.6 C) 98.1 F (36.7 C) 98.1 F (36.7 C) 98 F (36.7 C)  TempSrc: Oral Oral Oral Oral  Resp: '18 20 16 20  '$ Height:      Weight: 57.7 kg (127 lb 3.3 oz)   57.4 kg (126 lb 8.7 oz)  SpO2: 95% 97% 97% 94%    Last BM Date: 06/15/15  Intake/Output   Yesterday:  03/26 0701 - 03/27 0700 In: 600 [P.O.:600] Out: 125 [Urine:125] This shift: I/O last 3 completed shifts: In: 2490 [P.O.:1320; I.V.:1120; IV Piggyback:50] Out: 125 [Urine:125]   Physical Exam: General: Pt awake/alert and in no acute distress.  Chest: cta.  No chest wall pain w good excursion CV:  Pulses intact.  Regular rhythm MS: Normal AROM mjr joints.  No obvious deformity Abdomen: Soft.  distended.  Suprapubic ttp.  No evidence of peritonitis.  No incarcerated hernias. Ext:  SCDs BLE.  No mjr edema.  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 Active Problems:   Hypothyroidism   Anxiety state   Essential hypertension   Chronic back pain   Warfarin anticoagulation   History of gastric ulcer   COPD (chronic obstructive pulmonary disease) (HCC)   UTI (urinary tract infection)   Acute upper GI bleed   Encephalopathy, metabolic   Dementia in Alzheimer's disease   Adenocarcinoma of sigmoid colon ([place])    Results:   Labs: Results for orders placed or performed during the hospital encounter of 06/10/15 (from the past 48  hour(s))  Glucose, capillary     Status: Abnormal   Collection Time: 06/16/15  7:25 AM  Result Value Ref Range   Glucose-Capillary 117 (H) 65 - 99 mg/dL  CBC     Status: Abnormal   Collection Time: 06/17/15  5:23 AM  Result Value Ref Range   WBC 13.3 (H) 4.0 - 10.5 K/uL   RBC 4.12 3.87 - 5.11 MIL/uL   Hemoglobin 10.5 (L) 12.0 - 15.0 g/dL   HCT 33.0 (L) 36.0 - 46.0 %   MCV 80.1 78.0 - 100.0 fL   MCH 25.5 (L) 26.0 - 34.0 pg   MCHC 31.8 30.0 - 36.0 g/dL   RDW 15.3 11.5 - 15.5 %   Platelets 602 (H) 150 - 400 K/uL  Basic metabolic panel     Status: Abnormal   Collection Time: 06/17/15  5:23 AM  Result Value Ref Range   Sodium 128 (L) 135 - 145 mmol/L   Potassium 4.5 3.5 - 5.1 mmol/L   Chloride 93 (L) 101 - 111 mmol/L   CO2 25 22 - 32 mmol/L   Glucose, Bld 101 (H) 65 - 99 mg/dL   BUN 10 6 - 20 mg/dL   Creatinine, Ser 0.82 0.44 - 1.00 mg/dL   Calcium 9.1 8.9 - 10.3 mg/dL  GFR calc non Af Amer >60 >60 mL/min   GFR calc Af Amer >60 >60 mL/min    Comment: (NOTE) The eGFR has been calculated using the CKD EPI equation. This calculation has not been validated in all clinical situations. eGFR's persistently <60 mL/min signify possible Chronic Kidney Disease.    Anion gap 10 5 - 15  Glucose, capillary     Status: Abnormal   Collection Time: 06/17/15  5:24 AM  Result Value Ref Range   Glucose-Capillary 102 (H) 65 - 99 mg/dL    Imaging / Studies: No results found.  Medications / Allergies:  Scheduled Meds: . alvimopan  12 mg Oral Once  . benazepril  40 mg Oral Daily  . bisoprolol  5 mg Oral Daily  . bupivacaine liposome  20 mL Infiltration On Call to OR  . cefoTEtan (CEFOTAN) 2 GM IVPB  2 g Intravenous On Call to OR  . cefTRIAXone (ROCEPHIN)  IV  1 g Intravenous Q24H  . enoxaparin (LOVENOX) injection  40 mg Subcutaneous Once  . imipramine  30 mg Oral QHS  . pantoprazole (PROTONIX) IV  40 mg Intravenous Q12H  . spironolactone  25 mg Oral BID   Continuous Infusions: .  lactated ringers 10 mL/hr at 06/14/15 0629   PRN Meds:.acetaminophen **OR** acetaminophen, ALPRAZolam, iohexol, ondansetron **OR** ondansetron (ZOFRAN) IV, traMADol  Antibiotics: Anti-infectives    Start     Dose/Rate Route Frequency Ordered Stop   06/17/15 1100  cefoTEtan (CEFOTAN) 2 g in dextrose 5 % 50 mL IVPB     2 g 100 mL/hr over 30 Minutes Intravenous On call to O.R. 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 Hassell Done.  Consent is signed and in the chart(daughter, pt deemed incompetent due to dementia). [id number] on call to OR.  Rocephin for UTI FEN-NPO, add IVF VTE prophylaxis-SCD/lovenox Dispo-OR today    Erby Pian, [facility] Pager [phone](7A-4:30P)   06/17/2015 7:44 AM

## 2015-06-17 NOTE — Progress Notes (Signed)
Patient at procedure or test/unavailable. Pt scheduled for surgery on today Will check on pt next day.  [place], [place]

## 2015-06-17 NOTE — Progress Notes (Signed)
Patient with elevated BP post-op Dr. Dyann Kief notified, see order. Will continue to monitor.

## 2015-06-17 NOTE — Brief Op Note (Signed)
06/10/2015 - 06/17/2015  2:56 PM  PATIENT:  Natalie [name]  82 y.o. female  PRE-OPERATIVE DIAGNOSIS:  adenocarcinoma  POST-OPERATIVE DIAGNOSIS:  adenocarcinoma  PROCEDURE:  Procedure(s): LAPAROSCOPY LOOP COLOSTOMY (N/A)  SURGEON:  Surgeon(s) and Role:    * Johnathan Hausen, MD - Primary  PHYSICIAN ASSISTANT:   ASSISTANTS: Dyann Kief, MD   ANESTHESIA:   general  EBL:  Total I/O In: 1000 [I.V.:1000] Out: 285 [Urine:260; Blood:25]  BLOOD ADMINISTERED:none  DRAINS: none   LOCAL MEDICATIONS USED:  NONE  SPECIMEN:  No Specimen  DISPOSITION OF SPECIMEN:  N/A  COUNTS:  YES  TOURNIQUET:  * No tourniquets in log *  DICTATION: .Other Dictation: Dictation Number [id number]  PLAN OF CARE: patient already an inpatient  PATIENT DISPOSITION:  PACU - hemodynamically stable.   Delay start of Pharmacological VTE agent (>24hrs) due to surgical blood loss or risk of bleeding: not applicable

## 2015-06-17 NOTE — Anesthesia Postprocedure Evaluation (Signed)
Anesthesia Post Note  Patient: Natalie [name]  Procedure(s) Performed: Procedure(s) (LRB): LAPAROSCOPY LOOP COLOSTOMY (N/A)  Patient location during evaluation: PACU Anesthesia Type: General Level of consciousness: awake and alert Pain management: pain level controlled Vital Signs Assessment: post-procedure vital signs reviewed and stable Respiratory status: spontaneous breathing, nonlabored ventilation, respiratory function stable and patient connected to nasal cannula oxygen Cardiovascular status: blood pressure returned to baseline and stable Postop Assessment: no signs of nausea or vomiting Anesthetic complications: no    Last Vitals:  Filed Vitals:   06/17/15 1545 06/17/15 1557  BP: 168/112 162/115  Pulse: 85 98  Temp: 36.8 C 36.5 C  Resp: 16 16    Last Pain:  Filed Vitals:   06/17/15 1558  PainSc: Asleep                 Tiajuana Amass

## 2015-06-17 NOTE — Anesthesia Preprocedure Evaluation (Addendum)
Anesthesia Evaluation  Patient identified by MRN, date of birth, ID band Patient awake    Reviewed: Allergy & Precautions, NPO status , Patient's Chart, lab work & pertinent test results  Airway Mallampati: IV  TM Distance: >3 FB Neck ROM: Limited  Mouth opening: Limited Mouth Opening  Dental   Pulmonary COPD, former smoker,    breath sounds clear to auscultation       Cardiovascular hypertension, Pt. on medications and Pt. on home beta blockers  Rhythm:Irregular Rate:Normal     Neuro/Psych Anxiety Depression  Neuromuscular disease    GI/Hepatic Neg liver ROS, PUD, adenoca of the colon   Endo/Other    Renal/GU Renal disease     Musculoskeletal  (+) Arthritis ,   Abdominal   Peds  Hematology  (+) anemia ,   Anesthesia Other Findings   Reproductive/Obstetrics                            Lab Results  Component Value Date   WBC 13.3* 06/17/2015   HGB 10.5* 06/17/2015   HCT 33.0* 06/17/2015   MCV 80.1 06/17/2015   PLT 602* 06/17/2015   Lab Results  Component Value Date   CREATININE 0.82 06/17/2015   BUN 10 06/17/2015   NA 128* 06/17/2015   K 4.5 06/17/2015   CL 93* 06/17/2015   CO2 25 06/17/2015    Anesthesia Physical Anesthesia Plan  ASA: III  Anesthesia Plan: General   Post-op Pain Management:    Induction: Intravenous, Rapid sequence and Cricoid pressure planned  Airway Management Planned: Oral ETT  Additional Equipment:   Intra-op Plan:   Post-operative Plan: Extubation in OR  Informed Consent: I have reviewed the patients History and Physical, chart, labs and discussed the procedure including the risks, benefits and alternatives for the proposed anesthesia with the patient or authorized representative who has indicated his/her understanding and acceptance.   Dental advisory given  Plan Discussed with: CRNA  Anesthesia Plan Comments:        Anesthesia Quick  Evaluation

## 2015-06-17 NOTE — Progress Notes (Signed)
PT Cancellation Note  Patient Details Name: Natalie [name] MRN: [id number] DOB: November 04, 1930   Cancelled Treatment:    Reason Eval/Treat Not Completed: Patient at procedure or test/unavailable. Pt scheduled for surgery on today. Will hold PT and await recommendations.    Weston Anna, MPT Pager: [phone]

## 2015-06-18 LAB — BASIC METABOLIC PANEL
ANION GAP: 8 (ref 5–15)
BUN: 7 mg/dL (ref 6–20)
CHLORIDE: 93 mmol/L — AB (ref 101–111)
CO2: 27 mmol/L (ref 22–32)
Calcium: 8.7 mg/dL — ABNORMAL LOW (ref 8.9–10.3)
Creatinine, Ser: 0.66 mg/dL (ref 0.44–1.00)
Glucose, Bld: 161 mg/dL — ABNORMAL HIGH (ref 65–99)
POTASSIUM: 4.5 mmol/L (ref 3.5–5.1)
Sodium: 128 mmol/L — ABNORMAL LOW (ref 135–145)

## 2015-06-18 LAB — CBC
HEMATOCRIT: 31 % — AB (ref 36.0–46.0)
Hemoglobin: 10.2 g/dL — ABNORMAL LOW (ref 12.0–15.0)
MCH: 25.6 pg — ABNORMAL LOW (ref 26.0–34.0)
MCHC: 32.9 g/dL (ref 30.0–36.0)
MCV: 77.9 fL — ABNORMAL LOW (ref 78.0–100.0)
Platelets: 536 10*3/uL — ABNORMAL HIGH (ref 150–400)
RBC: 3.98 MIL/uL (ref 3.87–5.11)
RDW: 14.9 % (ref 11.5–15.5)
WBC: 15.4 10*3/uL — AB (ref 4.0–10.5)

## 2015-06-18 LAB — GLUCOSE, CAPILLARY: Glucose-Capillary: 97 mg/dL (ref 65–99)

## 2015-06-18 MED ORDER — ACETAMINOPHEN 325 MG PO TABS
325.0000 mg | ORAL_TABLET | Freq: Four times a day (QID) | ORAL | Status: DC | PRN
  Administered 2015-06-19 – 2015-06-25 (×4): 650 mg via ORAL
  Filled 2015-06-18 (×4): qty 2

## 2015-06-18 MED ORDER — TRAMADOL HCL 50 MG PO TABS
50.0000 mg | ORAL_TABLET | Freq: Four times a day (QID) | ORAL | Status: DC | PRN
  Administered 2015-06-18 – 2015-06-25 (×17): 50 mg via ORAL
  Filled 2015-06-18 (×17): qty 1

## 2015-06-18 MED ORDER — PANTOPRAZOLE SODIUM 40 MG IV SOLR
40.0000 mg | INTRAVENOUS | Status: DC
  Administered 2015-06-18 – 2015-06-24 (×7): 40 mg via INTRAVENOUS
  Filled 2015-06-18 (×8): qty 40

## 2015-06-18 NOTE — Progress Notes (Signed)
Natalie [name] PROGRESS NOTE  [name] [id number] DOB: 04/13/1930 DOA: 06/10/2015 PCP: Natalie [name]  Summary Natalie [name] is a pleasant 82 -year-old female with past medical history significant for paroxysmal atrial fibrillation on anticoagulation with ?Coumadin/Eliquis, hypertension, depression, Alzheimer's dementia, hypothyroidism who presented to [name] for evaluation of ongoing shortness of breath, weakness and reports of melanotic stool for last 1-2 weeks prior to this admission raising concern for symptomatic anemia while patient on anticoagulation. Patient has refused colonoscopy in the past even tough she would benefit from it if source of bleeding can be found to addressed and she can then take anticoagulant without concern for recurrent blood loss anemia. In any case, poor medication compliance makes her non ideal for anticoagulation. That said, patient has been seen by GI, Natalie [name]. Appreciate help. Noted efforts to arrange for colonoscopy Thursday. I discussed with patient's daughter Natalie [name] at [phone]. Family is updated on findings from colonoscopy and looking to meet with [facility] to discussed treatment options. Biopsies demonstrating adenocarcinoma and CT staging suggesting lung metastasis. Plan is status post laparoscopic loop colostomy on 3/27. Now advancing diet and been assessed by PT and WOC for colostomy.  Plan Acute GI bleed/colonic lesion (with high concerns for bleeding)  Hbg stable and no further bleeding appreciated   Off anticoagulation for now  Continue ppi, now IV  Following findings from colonoscopy: colonic lesion that appears to be source of bleeding; has been confirmed to be adenocarcinoma by pathology. Given ongoing partial high  Grade obstruction CCS has been consulted. Staging screening with CT suggesting lung metastasis; CEA pending. 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 According to Patient's daughter intermittent episodes of disorientation and unable  to be in charge of even taking her medications anymore   Continue supportive care and constant reorientation   Psych consultation to assess for capacity and competency appreciated. Patient found unable to make her own decisions   PT to reassess after surgery to assist with safer discharge base on rec's  E. Coli UTI  Pan-sensitive   continue rocephin  currently w/o dysuria  Patient has completed 7 days of rocephin during this hospitalization  No fever and WBC's elevated now after surgery; most likely demargination/stress response form surgery  Essential hypertension  Stable and fairly well controlled now  will continue PRN hydralazine and schedule metoprolol   once tolerating diet will advance/resume home medication regimen   Anxiety  Continue ativan PRN while advancing/toelrating diet  Code Status: Full code Family Communication: Daughter Natalie [name] by phone. Disposition Plan: to be determine; patient to be seen by PT again after surgery. Colonoscopy on 3/23 demonstrated colonic lesion with pathology demonstrating adenocarcinoma. Continue supportive care; following [facility] rec's  Consultants:  GI  CCS  Psych   Procedures:  Colonoscopy: 3/23 Likely colon cancer of the rectosigmoid junction region, w/ partial high-grade obstruction.  Laparoscopic loop colostomy 3/27  Antibiotics:  Ceftriaxone 06/11/15>> 3/27  HPI/Subjective: No fever, no nausea, no vomiting. Complaining of pain in her tummy. Denies CP and is In no distress.  Objective: Filed Vitals:   06/18/15 0643 06/18/15 1439  BP: 162/87 149/86   Pulse:  98  Temp:  98 F (36.7 C)  Resp:  18    Intake/Output Summary (Last 24 hours) at 06/18/15 1729 Last data filed at 06/18/15 1639  Gross per 24 hour  Intake 1043.33 ml  Output   1070 ml  Net -26.67 ml   Filed Weights   06/15/15 0625 06/16/15 0616 06/17/15 0520  Weight: 59.3 kg (130 lb 11.7 oz) 57.7 kg (127 lb 3.3 oz) 57.4 kg (126 lb 8.7 oz)    Exam:   General:  Complaining of some pain in her tummy, no fever and in no distress. POD #1; surgery planning to slowly advance diet. Reports no nausea or vomiting.   Cardiovascular: S1-S2 normal. No murmurs. Pulse regular.  Respiratory: Good air entry bilaterally. No rhonchi or rales.  Abdomen: Soft, mild distension, colostomy in place, currently with some edema; small amount of stools seen in colostomy bag. BS present but decrease.   Musculoskeletal: No pedal edema   Neurological: oriented X2; per family at bedside essentially at bedside. Moves all extremities. No focal deficit appreciated  Data Reviewed: Basic Metabolic Panel:  Recent Labs Lab 06/12/15 0455 06/17/15 0523 06/18/15 0510  NA 132* 128* 128*  K 3.9 4.5 4.5  CL 99* 93* 93*  CO2 24 25 27   GLUCOSE 125* 101* 161*  BUN 13 10 7   CREATININE 0.82 0.82 0.66  CALCIUM 8.5* 9.1 8.7*   Liver Function Tests:  Recent Labs Lab 06/12/15 0455  AST 20  ALT 13*  ALKPHOS 59  BILITOT 0.3  PROT 5.9*  ALBUMIN 2.9*   CBC:  Recent Labs Lab 06/12/15 0455 06/13/15 0458 06/17/15 0523 06/18/15 0510  WBC 12.0* 10.6* 13.3* 15.4*  HGB 9.2* 9.4* 10.5* 10.2*  HCT 29.7* 29.3* 33.0* 31.0*  MCV 83.2 79.2 80.1 77.9*  PLT 494* 461* 602* 536*   CBG:  Recent Labs Lab 06/13/15 0729 06/15/15 0729 06/16/15 0725 06/17/15 0524 06/17/15 0740  GLUCAP 83 89 117* 102* 97    Recent Results (from the past 240 hour(s))  Culture, Urine     Status: None   Collection Time: 06/11/15  7:00 PM  Result Value Ref Range Status   Specimen Description URINE, RANDOM  Final    Special Requests NONE  Final   Culture   Final    >=100,000 COLONIES/mL ESCHERICHIA COLI Performed at [name]    Report Status 06/13/2015 FINAL  Final   Organism ID, Bacteria ESCHERICHIA COLI  Final      Susceptibility   Escherichia coli - MIC*    AMPICILLIN 8 SENSITIVE Sensitive     CEFAZOLIN <=4 SENSITIVE Sensitive     CEFTRIAXONE <=1 SENSITIVE Sensitive     CIPROFLOXACIN <=0.25 SENSITIVE Sensitive     GENTAMICIN <=1 SENSITIVE Sensitive     IMIPENEM <=0.25 SENSITIVE Sensitive     NITROFURANTOIN <=16 SENSITIVE Sensitive     TRIMETH/SULFA <=20 SENSITIVE Sensitive     AMPICILLIN/SULBACTAM 4 SENSITIVE Sensitive     PIP/TAZO <=4 SENSITIVE Sensitive     * >=100,000 COLONIES/mL ESCHERICHIA COLI  Surgical pcr screen     Status: None   Collection Time: 06/17/15  6:42 AM  Result Value Ref Range Status   MRSA, PCR NEGATIVE NEGATIVE Final   Staphylococcus aureus NEGATIVE NEGATIVE Final    Comment:        The Xpert SA Assay (FDA approved for NASAL specimens in patients over 65 years of age), is one component of a comprehensive surveillance program.  Test performance has been validated by [facility] for patients greater than or equal to 18 year old. It is not intended to diagnose infection nor to guide or monitor treatment.      Studies: No results found.  Scheduled Meds: . heparin subcutaneous  5,000 Units Subcutaneous 3 times per day  . metoprolol  5 mg Intravenous 3 times per day   Continuous Infusions: . dextrose 5 % and 0.45 % NaCl with KCl 20 mEq/L 75 mL/hr at 06/18/15 1644     Time spent: 30 minutes    Barton Dubois  Natalie [name] Pager [phone]. If 7PM-7AM, please contact night-coverage at [email or web address], password [facility] 06/18/2015, 5:29 PM  LOS: 8 days

## 2015-06-18 NOTE — Care Management Important Message (Signed)
Important Message  Patient Details  Name: Natalie [name] MRN: [id number] Date of Birth: 06/26/1930   Medicare Important Message Given:  Yes    Camillo Flaming 06/18/2015, 11:07 AMImportant Message  Patient Details  Name: Natalie [name] MRN: [id number] Date of Birth: 23-Oct-1930   Medicare Important Message Given:  Yes    Camillo Flaming 06/18/2015, 11:07 AM

## 2015-06-18 NOTE — Progress Notes (Signed)
Physical Therapy Treatment Patient Details Name: Natalie [name] MRN: [id number] DOB: 1930/04/28 Today's Date: 06/18/2015    History of Present Illness 82 yo female admitted with upper GI bleed, now s/p colostomy;   PMHx: HTN, afib, dementia    PT Comments    Pt progressing slowly, pt currently requiring max assist for bed mobility and transfers; pt reports she is not going to SNF, she will be going home with dtr--most certainly will need [id number] assist  Follow Up Recommendations  Supervision/Assistance - 24 hour;SNF (pt refusing SNF, she is requiring max A  for bed-mob/xfers)     Equipment Recommendations  None recommended by PT    Recommendations for Other Services       Precautions / Restrictions Precautions Precautions: Fall Restrictions Weight Bearing Restrictions: No    Mobility  Bed Mobility Overal bed mobility: Needs Assistance Bed Mobility: Rolling;Sidelying to Sit Rolling: Mod assist;Max assist Sidelying to sit: Max assist       General bed mobility comments: Assist for trunk and LEs. Increased time. Cues for safety, technique  Transfers Overall transfer level: Needs assistance Equipment used: Rolling walker (2 wheeled) Transfers: Sit to/from [facility] Sit to Stand: Max assist;+2 safety/equipment Stand pivot transfers: Max assist;+2 safety/equipment       General transfer comment: Assist to rise, stabilize, control descent. Multimodal cues for safety, hand placement, technique. Stand pivot, bed to bsc, with RW. Weight shifted posteriorly  Ambulation/Gait Ambulation/Gait assistance: Max assist Ambulation Distance (Feet): 4 Feet (pivotal steps to [facility] and chair) Assistive device: Rolling walker (2 wheeled) Gait Pattern/deviations: Shuffle;Leaning posteriorly;Trunk flexed     General Gait Details: Assist to stabilize and maneuver with walker. Very unsteady. Fatigues quickly.    Stairs            Wheelchair Mobility     Modified Rankin (Stroke Patients Only)       Balance                                    Cognition Arousal/Alertness: Awake/alert Behavior During Therapy: WFL for tasks assessed/performed Overall Cognitive Status: History of cognitive impairments - at baseline Area of Impairment: Problem solving;Awareness;Safety/judgement     Memory: Decreased short-term memory   Safety/Judgement: Decreased awareness of safety;Decreased awareness of deficits   Problem Solving: Decreased initiation;Difficulty sequencing;Requires verbal cues;Requires tactile cues General Comments: pt repeatedly states she "will be fine at home"    Exercises      General Comments        Pertinent Vitals/Pain Pain Assessment: 0-10 Pain Score: 8  Pain Location: abd Pain Descriptors / Indicators: Constant;Cramping Pain Intervention(s): Limited activity within patient's tolerance;Monitored during session;Premedicated before session;Repositioned;Patient requesting pain meds-RN notified    Home Living                      Prior Function            PT Goals (current goals can now be found in the care plan section) Acute Rehab PT Goals Patient Stated Goal: none stated PT Goal Formulation: With patient Time For Goal Achievement: 06/19/15 Potential to Achieve Goals: Good Progress towards PT goals: Progressing toward goals    Frequency  Min 3X/week    PT Plan Current plan remains appropriate    Co-evaluation             End of Session Equipment Utilized During Treatment: Gait belt Activity  Tolerance: Patient limited by pain;Patient limited by fatigue Patient left: in chair;with call bell/phone within reach;with chair alarm set     Time: [id number] PT Time Calculation (min) (ACUTE ONLY): 31 min  Charges:  $Therapeutic Activity: 23-37 mins                    G Codes:      Keyetta Hollingworth 2015-06-25, 1:26 PM

## 2015-06-18 NOTE — Op Note (Signed)
Natalie [name], Natalie [name]             ACCOUNT NO.:  [id number]  MEDICAL RECORD NO.:  [id number]  LOCATION:                                 FACILITY:  PHYSICIAN:  Isabel Caprice. Hassell Done, MD  DATE OF BIRTH:  10-21-1930  DATE OF PROCEDURE: DATE OF DISCHARGE:                              OPERATIVE REPORT   PREOPERATIVE DIAGNOSIS:  Obstructing mass in the distal colon with bowel obstruction.  POSTOPERATIVE DIAGNOSIS:  Obstructing mass in the distal colon with bowel obstruction.  PROCEDURE:  Laparoscopy and open-loop colostomy in the left upper quadrant.  SURGEON:  Isabel Caprice. Hassell Done, MD  ASSISTANTKathreen Cosier.  ANESTHESIA:  General endotracheal.  DESCRIPTION OF PROCEDURE:  The patient was taken to room 6 and given general anesthesia.  The abdomen was prepped with PC-Max and draped sterilely.  The abdomen was noted to have visible loops of bowel.  This indicated very thin abdomen as well as marked distention.  Time-out was performed.  We decided and elected to do a cut down just below the umbilicus and entered the abdomen without difficulty.  With the scope in place, we looked around and found her colon to be markedly dilated from the cecum, transverse colon, left colon, and then we followed it down into the pelvis where there would seem to be adhesed area of the small bowel to the anterior abdominal wall.  There was no gross visible tumor present, but we suspected that is the case.  There was no way we could get right down on this or resected without converting this to a very more involved operation and was felt that she was extremely fragile based on her skin which had demonstrated tearing just with a prep according to the nurses and the thinness of her abdominal wall.  We elected to find this loop of sigmoid colon which we were able to grasp and we decided to make the ostomy in the previously marked site on the left side which was slightly above and lateral to the umbilicus. Standard  opening was made in the skin and we opened the fascia which was very thin.  I was able to carefully mobilize this loop up into this wound.  We had put a Babcock on it and that just actually tore, although there was no spillage.  I was able to get a Penrose drain underneath it and the bridge that slid under, it looks like a butterfly and could be sutured to the skin with 4-0 nylons.  This kept the colon nicely outside the abdomen.  We also then matured it with some 3-0 chromic to the skin. The umbilical defect was tied down what we done pursestring suture and then was closed with 4-0 Vicryl and Dermabond before maturing the colostomy.  Once matured, we put our finger in it, got gaseous decompression.  The other 5 mm port was placed on the right side, laterally it was similarly closed with 4-0 Vicryl and Dermabond.  This patient seemed to tolerate the procedure well and was taken to the recovery room in satisfactory condition.     Isabel Caprice Hassell Done, MD   ______________________________ Isabel Caprice Hassell Done, MD    MBM/MEDQ  D:  06/17/2015  T:  06/18/2015  Job:  [id number]

## 2015-06-18 NOTE — Progress Notes (Signed)
Patient ID: Natalie [name], female   DOB: 1930-06-24, 82 y.o.   MRN: [id number]     [facility]      [address]   [place], [place] [address]    Phone: [phone] FAX: [phone]     Subjective: Sore.  Ostomy with flatus and stool. Afebrile.   Objective:  Vital signs:  Filed Vitals:   06/17/15 1930 06/17/15 2219 06/18/15 0530 06/18/15 0643  BP: 148/98 175/95 173/91 162/87  Pulse:  84 106   Temp:  98.2 F (36.8 C) 98.4 F (36.9 C)   TempSrc:  Oral Oral   Resp:  18 16   Height:      Weight:      SpO2:  100% 100%     Last BM Date:  (colostomy bag)  Intake/Output   Yesterday:  03/27 0701 - 03/28 0700 In: 3043.3 [I.V.:3043.3] Out: 1210 [Urine:735; Stool:450; Blood:25] This shift:      Physical Exam: General: Pt awake and in no acute distress Abdomen: Soft.  Nondistended.   Mildly tender at incisions only.  Stoma is pink swollen and viable.  Stool and air present.  No evidence of peritonitis.  No incarcerated hernias.    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 Active Problems:   Hypothyroidism   Anxiety state   Essential hypertension   Chronic back pain   Warfarin anticoagulation   History of gastric ulcer   COPD (chronic obstructive pulmonary disease) (HCC)   UTI (urinary tract infection)   Acute upper GI bleed   Encephalopathy, metabolic   Dementia in Alzheimer's disease   Adenocarcinoma of sigmoid colon (HCC)    Results:   Labs: Results for orders placed or performed during the hospital encounter of 06/10/15 (from the past 48 hour(s))  Hemoglobin A1c     Status: Abnormal   Collection Time: 06/16/15  4:19 PM  Result Value Ref Range   Hgb A1c MFr Bld 6.2 (H) 4.8 - 5.6 %    Comment: (NOTE)         Pre-diabetes: 5.7 - 6.4         Diabetes: >6.4         Glycemic control for adults with diabetes: <7.0    Mean Plasma Glucose 131 mg/dL    Comment:  (NOTE) Performed At: [facility] [address] [place], [place] [id number] Mila Homer MD [id number]   CBC     Status: Abnormal   Collection Time: 06/17/15  5:23 AM  Result Value Ref Range   WBC 13.3 (H) 4.0 - 10.5 K/uL   RBC 4.12 3.87 - 5.11 MIL/uL   Hemoglobin 10.5 (L) 12.0 - 15.0 g/dL   HCT 80.4 (L) 13.6 - 33.5 %   MCV 80.1 78.0 - 100.0 fL   MCH 25.5 (L) 26.0 - 34.0 pg   MCHC 31.8 30.0 - 36.0 g/dL   RDW 08.8 27.1 - 36.0 %   Platelets 602 (H) 150 - 400 K/uL  Basic metabolic panel     Status: Abnormal   Collection Time: 06/17/15  5:23 AM  Result Value Ref Range   Sodium 128 (L) 135 - 145 mmol/L   Potassium 4.5 3.5 - 5.1 mmol/L   Chloride 93 (L) 101 - 111 mmol/L   CO2 25 22 - 32 mmol/L   Glucose, Bld 101 (H) 65 - 99 mg/dL   BUN 10 6 - 20 mg/dL   Creatinine, Ser  0.82 0.44 - 1.00 mg/dL   Calcium 9.1 8.9 - 10.3 mg/dL   GFR calc non Af Amer >60 >60 mL/min   GFR calc Af Amer >60 >60 mL/min    Comment: (NOTE) The eGFR has been calculated using the CKD EPI equation. This calculation has not been validated in all clinical situations. eGFR's persistently <60 mL/min signify possible Chronic Kidney Disease.    Anion gap 10 5 - 15  Glucose, capillary     Status: Abnormal   Collection Time: 06/17/15  5:24 AM  Result Value Ref Range   Glucose-Capillary 102 (H) 65 - 99 mg/dL  Surgical pcr screen     Status: None   Collection Time: 06/17/15  6:42 AM  Result Value Ref Range   MRSA, PCR NEGATIVE NEGATIVE   Staphylococcus aureus NEGATIVE NEGATIVE    Comment:        The Xpert SA Assay (FDA approved for NASAL specimens in patients over 73 years of age), is one component of a comprehensive surveillance program.  Test performance has been validated by [facility] for patients greater than or equal to 55 year old. It is not intended to diagnose infection nor to guide or monitor treatment.   Glucose, capillary     Status: None   Collection Time: 06/17/15  7:40 AM   Result Value Ref Range   Glucose-Capillary 97 65 - 99 mg/dL  Type and screen     Status: None   Collection Time: 06/17/15 10:13 AM  Result Value Ref Range   ABO/RH(D) B POS    Antibody Screen NEG    Sample Expiration 01/60/1093   Basic metabolic panel     Status: Abnormal   Collection Time: 06/18/15  5:10 AM  Result Value Ref Range   Sodium 128 (L) 135 - 145 mmol/L   Potassium 4.5 3.5 - 5.1 mmol/L   Chloride 93 (L) 101 - 111 mmol/L   CO2 27 22 - 32 mmol/L   Glucose, Bld 161 (H) 65 - 99 mg/dL   BUN 7 6 - 20 mg/dL   Creatinine, Ser 0.66 0.44 - 1.00 mg/dL   Calcium 8.7 (L) 8.9 - 10.3 mg/dL   GFR calc non Af Amer >60 >60 mL/min   GFR calc Af Amer >60 >60 mL/min    Comment: (NOTE) The eGFR has been calculated using the CKD EPI equation. This calculation has not been validated in all clinical situations. eGFR's persistently <60 mL/min signify possible Chronic Kidney Disease.    Anion gap 8 5 - 15  CBC     Status: Abnormal   Collection Time: 06/18/15  5:10 AM  Result Value Ref Range   WBC 15.4 (H) 4.0 - 10.5 K/uL   RBC 3.98 3.87 - 5.11 MIL/uL   Hemoglobin 10.2 (L) 12.0 - 15.0 g/dL   HCT 31.0 (L) 36.0 - 46.0 %   MCV 77.9 (L) 78.0 - 100.0 fL   MCH 25.6 (L) 26.0 - 34.0 pg   MCHC 32.9 30.0 - 36.0 g/dL   RDW 14.9 11.5 - 15.5 %   Platelets 536 (H) 150 - 400 K/uL    Imaging / Studies: No results found.  Medications / Allergies:  Scheduled Meds: . heparin subcutaneous  5,000 Units Subcutaneous 3 times per day  . metoprolol  5 mg Intravenous 3 times per day   Continuous Infusions: . dextrose 5 % and 0.45 % NaCl with KCl 20 mEq/L 75 mL/hr at 06/18/15 0511   PRN Meds:.hydrALAZINE, LORazepam, morphine injection, [  DISCONTINUED] ondansetron **OR** ondansetron (ZOFRAN) IV  Antibiotics: Anti-infectives    Start     Dose/Rate Route Frequency Ordered Stop   06/17/15 1100  cefoTEtan (CEFOTAN) 2 g in dextrose 5 % 50 mL IVPB     2 g 100 mL/hr over 30 Minutes Intravenous On call to  O.R. 06/16/15 1156 06/17/15 1328   06/11/15 2000  cefTRIAXone (ROCEPHIN) 1 g in dextrose 5 % 50 mL IVPB  Status:  Discontinued     1 g 100 mL/hr over 30 Minutes Intravenous Every 24 hours 06/11/15 1844 06/17/15 1600        Assessment/Plan Distal sigmoid adenocarcinoma with near obstruction POD#1 laparoscopic loop colostomy---Dr. Hassell Done  -start clears, IS, pain control, mobilize, WOC consult -PT eval, not sure of baseline mobility  -add tylenol and tramadol for pain  VTE prophylaxis-SCD/heparin  Dispo-DC once tolerating POs.  Erby Pian, [facility] Pager [phone](7A-4:30P)   06/18/2015 9:05 AM   I have seen and examined this patient and agree with the assessment and plan.   Matt B. Hassell Done, MD, [facility] [phone] beeper [phone]  06/19/2015 7:44 AM

## 2015-06-18 NOTE — Consult Note (Signed)
WOC ostomy consult note Stoma type/location: LLQ Loop Colostomy. Edematous, unmatured, with plastic rod in place. Stomal assessment/size: approximately 3 inches round Peristomal assessment:Not seen today Treatment options for stomal/peristomal skin: None today Output small amount of serosanguinous liquid in pouch. No flatus, no stool. Ostomy pouching: 2pc.  Education provided: None today.  Each time I mention to patient that she has a colostomy she says, "No, its a gall bladder." Enrolled patient in [place] program: No WOC nursing team will not follow, and will remain available to this patient, her family the nursing, surgical and medical teams. Plan for initial pouching system change tomorrow. Thanks, Maudie Flakes, MSN, RN, [place], Arther Abbott  Pager# [phone]

## 2015-06-19 DIAGNOSIS — C187 Malignant neoplasm of sigmoid colon: Principal | ICD-10-CM

## 2015-06-19 DIAGNOSIS — N390 Urinary tract infection, site not specified: Secondary | ICD-10-CM

## 2015-06-19 DIAGNOSIS — K5660 Unspecified intestinal obstruction: Secondary | ICD-10-CM

## 2015-06-19 DIAGNOSIS — I10 Essential (primary) hypertension: Secondary | ICD-10-CM

## 2015-06-19 DIAGNOSIS — G9341 Metabolic encephalopathy: Secondary | ICD-10-CM

## 2015-06-19 LAB — GLUCOSE, CAPILLARY
Glucose-Capillary: 129 mg/dL — ABNORMAL HIGH (ref 65–99)
Glucose-Capillary: 104 mg/dL — ABNORMAL HIGH (ref 65–99)

## 2015-06-19 MED ORDER — HALOPERIDOL LACTATE 5 MG/ML IJ SOLN
1.0000 mg | Freq: Four times a day (QID) | INTRAMUSCULAR | Status: DC | PRN
Start: 2015-06-19 — End: 2015-06-25
  Administered 2015-06-19: 1 mg via INTRAVENOUS
  Filled 2015-06-19: qty 1

## 2015-06-19 NOTE — Progress Notes (Signed)
Patient ID: Natalie [name], female   DOB: 16-Jun-1930, 82 y.o.   MRN: [id number]     [facility]      [address] [place], [place] [address]    Phone: [phone] FAX: [phone]     Subjective: Sore.  No n/v.  Hungry.  Tolerating clears.  Objective:  Vital signs:  Filed Vitals:   06/18/15 1439 06/18/15 2106 06/18/15 2300 06/19/15 0559  BP: 149/86 159/90 168/93 169/94  Pulse: 98 101 104 93  Temp: 98 F (36.7 C)  98.1 F (36.7 C) 98.3 F (36.8 C)  TempSrc: Oral  Oral Oral  Resp: '18  18 19  '$ Height:      Weight:      SpO2: 97%  98% 96%    Last BM Date:  (colostomy bag)  Intake/Output   Yesterday:  03/28 0701 - 03/29 0700 In: 2152.5 [P.O.:360; I.V.:1792.5] Out: 870 [Urine:525; Stool:345] This shift:        Physical Exam: General: Pt awake and in no acute distress Abdomen: Soft. Nondistended. Mildly tender at incisions only. Stoma is pink swollen, patches of discoloration, white slough, but viable. Stool and air present. No evidence of peritonitis. No incarcerated hernias.  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 Active Problems:   Hypothyroidism   Anxiety state   Essential hypertension   Chronic back pain   Warfarin anticoagulation   History of gastric ulcer   COPD (chronic obstructive pulmonary disease) (HCC)   UTI (urinary tract infection)   Acute upper GI bleed   Encephalopathy, metabolic   Dementia in Alzheimer's disease   Adenocarcinoma of sigmoid colon ([place])    Results:   Labs: Results for orders placed or performed during the hospital encounter of 06/10/15 (from the past 48 hour(s))  Type and screen     Status: None   Collection Time: 06/17/15 10:13 AM  Result Value Ref Range   ABO/RH(D) B POS    Antibody Screen NEG    Sample Expiration 26/83/4196   Basic metabolic panel     Status: Abnormal   Collection Time: 06/18/15  5:10 AM   Result Value Ref Range   Sodium 128 (L) 135 - 145 mmol/L   Potassium 4.5 3.5 - 5.1 mmol/L   Chloride 93 (L) 101 - 111 mmol/L   CO2 27 22 - 32 mmol/L   Glucose, Bld 161 (H) 65 - 99 mg/dL   BUN 7 6 - 20 mg/dL   Creatinine, Ser 0.66 0.44 - 1.00 mg/dL   Calcium 8.7 (L) 8.9 - 10.3 mg/dL   GFR calc non Af Amer >60 >60 mL/min   GFR calc Af Amer >60 >60 mL/min    Comment: (NOTE) The eGFR has been calculated using the CKD EPI equation. This calculation has not been validated in all clinical situations. eGFR's persistently <60 mL/min signify possible Chronic Kidney Disease.    Anion gap 8 5 - 15  CBC     Status: Abnormal   Collection Time: 06/18/15  5:10 AM  Result Value Ref Range   WBC 15.4 (H) 4.0 - 10.5 K/uL   RBC 3.98 3.87 - 5.11 MIL/uL   Hemoglobin 10.2 (L) 12.0 - 15.0 g/dL   HCT 31.0 (L) 36.0 - 46.0 %   MCV 77.9 (L) 78.0 - 100.0 fL   MCH 25.6 (L) 26.0 - 34.0 pg   MCHC 32.9 30.0 - 36.0 g/dL   RDW 14.9 11.5 -  15.5 %   Platelets 536 (H) 150 - 400 K/uL    Imaging / Studies: No results found.  Medications / Allergies:  Scheduled Meds: . heparin subcutaneous  5,000 Units Subcutaneous 3 times per day  . metoprolol  5 mg Intravenous 3 times per day  . pantoprazole (PROTONIX) IV  40 mg Intravenous Q24H   Continuous Infusions: . dextrose 5 % and 0.45 % NaCl with KCl 20 mEq/L 75 mL/hr at 06/19/15 0605   PRN Meds:.acetaminophen, hydrALAZINE, LORazepam, morphine injection, [DISCONTINUED] ondansetron **OR** ondansetron (ZOFRAN) IV, traMADol  Antibiotics: Anti-infectives    Start     Dose/Rate Route Frequency Ordered Stop   06/17/15 1100  cefoTEtan (CEFOTAN) 2 g in dextrose 5 % 50 mL IVPB     2 g 100 mL/hr over 30 Minutes Intravenous On call to O.R. 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 Hassell Done  -advance to fulls, IS, pain control, mobilize, WOC consult -PT eval, not sure of baseline mobility  -tylenol and tramadol for pain  -monitor stoma(pink, superficial patches of necrosis, but viable) -await pathology VTE prophylaxis-SCD/heparin  Dispo-DC once tolerating POs.  Erby Pian, [facility] Surgery Pager [phone]([phone])   06/19/2015 8:22 AM

## 2015-06-19 NOTE — Progress Notes (Signed)
[facility] PROGRESS NOTE    Progress Note   [name] [id number] DOB: 06/15/1930 DOA: 06/10/2015 PCP: Marton Redwood, MD   Brief Narrative:   Natalie [name] is an 82 y.o. female past medical history of paroxysmal atrial fibrillation on anticoagulation, Alzheimer's dementia presents to [facility] ED for evaluation of her shortness of breath and weakness. She was found to have symptomatic anemia, patient refuse colonoscopy in the past, colonoscopy performed on 06/13/2015 that showed a probable malignant lesion with complete obstruction of the rectal sigmoid colon, biopsy confirmed adenocarcinoma, surgery was consulted and recommended laparoscopy and open loop colostomy on the left upper quadrant performed on 06/18/2015. 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 - Status post colonoscopy performed on 06/13/2015 with biopsy confirming adenocarcinoma. - Gen. surgery was consulted recommended surgical intervention for complete rectal sigmoid obstruction, she is status post laparoscopy and open loop colostomy on the left upper quadrant performed on 06/18/2015. - CT of the chest on 06/13/2015 showed new 8 x 5 mm nodule in the right middle lobe  - NGT is out, surgery recommended to advance diet. - CEA is 11.4, physical therapy has been reconsulted.  - Patient has been seen by [facility]. - Advised diet as tolerated.  Alzheimer's dementia/organic brain dysfunction: - She is unable to perform her ADLs at home and has poor insight on her medical condition. - According to to the daughter she cannot take her medications at home. - Psychiatry was consulted they deemed it patient not to have capacity. - Physical therapy evaluated the patient. - We'll need to discuss with her daughter if she would like to meet with palliative care. - Start of  seropurulent night Haldol when necessary. - We'll need to discuss with the daughter the need to meet with palliative care.  Escherichia coli UTI: Pansensitive, she completed a seven-day course of antibiotic in house.  Essential hypertension: Fairly controlled continue IV hydralazine when necessary, continue schedule metoprolol.  Anxiety state: Continue Ativan when necessary.  COPD (chronic obstructive pulmonary disease) (HCC) Stable.  Encephalopathy, metabolic Haldol PRN for agitation.  DVT Prophylaxis - Lovenox ordered.  Family Communication: daughter Disposition Plan: Home in 1-2 days Code Status:  Code Status History    Date Active Date Inactive Code Status Order ID Comments User Context   06/10/2015  8:17 PM 06/17/2015  4:00 PM Full Code [id number]  Robbie Lis, MD Inpatient   11/30/2014  8:19 PM 12/04/2014  4:45 PM Full Code [id number]  Ivor Costa, MD ED   08/15/2011  4:31 AM 09/01/2011  5:49 PM Full Code [id number]  Asa Saunas, MD ED    Advance Directive Documentation        Most Recent Value   Type of Advance Directive  Healthcare Power of Attorney, Living will   Pre-existing out of facility DNR order (yellow form or pink MOST form)     "MOST" Form in Place?          IV Access:    Peripheral IV   Procedures and diagnostic studies:   No results found.   Medical Consultants:    None.  Anti-Infectives:   Anti-infectives    Start     Dose/Rate Route Frequency Ordered Stop   06/17/15 1100  cefoTEtan (CEFOTAN) 2 g in dextrose 5 % 50 mL IVPB     2 g 100 mL/hr over  30 Minutes Intravenous On call to O.R. 06/16/15 1156 06/17/15 1328   06/11/15 2000  cefTRIAXone (ROCEPHIN) 1 g in dextrose 5 % 50 mL IVPB  Status:  Discontinued     1 g 100 mL/hr over 30 Minutes Intravenous Every 24 hours 06/11/15 1844 06/17/15 1600      Subjective:    [name] no complaints.  Objective:    Filed Vitals:   06/18/15 1439 06/18/15 2106 06/18/15 2300 06/19/15 0559    BP: 149/86 159/90 168/93 169/94  Pulse: 98 101 104 93  Temp: 98 F (36.7 C)  98.1 F (36.7 C) 98.3 F (36.8 C)  TempSrc: Oral  Oral Oral  Resp: 18  18 19   Height:      Weight:      SpO2: 97%  98% 96%    Intake/Output Summary (Last 24 hours) at 06/19/15 1049 Last data filed at 06/19/15 0654  Gross per 24 hour  Intake 1992.5 ml  Output    870 ml  Net 1122.5 ml   Filed Weights   06/15/15 0625 06/16/15 0616 06/17/15 0520  Weight: 59.3 kg (130 lb 11.7 oz) 57.7 kg (127 lb 3.3 oz) 57.4 kg (126 lb 8.7 oz)    Exam: Gen:  NAD Cardiovascular:  RRR. Chest and lungs:   CTAB Abdomen:  Abdomen soft, NT/ND, + BS Extremities:  No edema   Data Reviewed:    Labs: Basic Metabolic Panel:  Recent Labs Lab 06/17/15 0523 06/18/15 0510  NA 128* 128*  K 4.5 4.5  CL 93* 93*  CO2 25 27  GLUCOSE 101* 161*  BUN 10 7  CREATININE 0.82 0.66  CALCIUM 9.1 8.7*   GFR Estimated Creatinine Clearance: 42.9 mL/min (by C-G formula based on Cr of 0.66). Liver Function Tests: No results for input(s): AST, ALT, ALKPHOS, BILITOT, PROT, ALBUMIN in the last 168 hours. No results for input(s): LIPASE, AMYLASE in the last 168 hours. No results for input(s): AMMONIA in the last 168 hours. Coagulation profile No results for input(s): INR, PROTIME in the last 168 hours.  CBC:  Recent Labs Lab 06/13/15 0458 06/17/15 0523 06/18/15 0510  WBC 10.6* 13.3* 15.4*  HGB 9.4* 10.5* 10.2*  HCT 29.3* 33.0* 31.0*  MCV 79.2 80.1 77.9*  PLT 461* 602* 536*   Cardiac Enzymes: No results for input(s): CKTOTAL, CKMB, CKMBINDEX, TROPONINI in the last 168 hours. BNP (last 3 results) No results for input(s): PROBNP in the last 8760 hours. CBG:  Recent Labs Lab 06/13/15 0729 06/15/15 0729 06/16/15 0725 06/17/15 0524 06/17/15 0740  GLUCAP 83 89 117* 102* 97   D-Dimer: No results for input(s): DDIMER in the last 72 hours. Hgb A1c:  Recent Labs  06/16/15 1619  HGBA1C 6.2*   Lipid Profile: No  results for input(s): CHOL, HDL, LDLCALC, TRIG, CHOLHDL, LDLDIRECT in the last 72 hours. Thyroid function studies: No results for input(s): TSH, T4TOTAL, T3FREE, THYROIDAB in the last 72 hours.  Invalid input(s): FREET3 Anemia work up: No results for input(s): VITAMINB12, FOLATE, FERRITIN, TIBC, IRON, RETICCTPCT in the last 72 hours. Sepsis Labs:  Recent Labs Lab 06/13/15 0458 06/17/15 0523 06/18/15 0510  WBC 10.6* 13.3* 15.4*   Microbiology Recent Results (from the past 240 hour(s))  Culture, Urine     Status: None   Collection Time: 06/11/15  7:00 PM  Result Value Ref Range Status   Specimen Description URINE, RANDOM  Final   Special Requests NONE  Final   Culture   Final    >=  100,000 COLONIES/mL ESCHERICHIA COLI Performed at [facility]    Report Status 06/13/2015 FINAL  Final   Organism ID, Bacteria ESCHERICHIA COLI  Final      Susceptibility   Escherichia coli - MIC*    AMPICILLIN 8 SENSITIVE Sensitive     CEFAZOLIN <=4 SENSITIVE Sensitive     CEFTRIAXONE <=1 SENSITIVE Sensitive     CIPROFLOXACIN <=0.25 SENSITIVE Sensitive     GENTAMICIN <=1 SENSITIVE Sensitive     IMIPENEM <=0.25 SENSITIVE Sensitive     NITROFURANTOIN <=16 SENSITIVE Sensitive     TRIMETH/SULFA <=20 SENSITIVE Sensitive     AMPICILLIN/SULBACTAM 4 SENSITIVE Sensitive     PIP/TAZO <=4 SENSITIVE Sensitive     * >=100,000 COLONIES/mL ESCHERICHIA COLI  Surgical pcr screen     Status: None   Collection Time: 06/17/15  6:42 AM  Result Value Ref Range Status   MRSA, PCR NEGATIVE NEGATIVE Final   Staphylococcus aureus NEGATIVE NEGATIVE Final    Comment:        The Xpert SA Assay (FDA approved for NASAL specimens in patients over 51 years of age), is one component of a comprehensive surveillance program.  Test performance has been validated by [facility] for patients greater than or equal to 64 year old. It is not intended to diagnose infection nor to guide or monitor treatment.       Medications:   . heparin subcutaneous  5,000 Units Subcutaneous 3 times per day  . metoprolol  5 mg Intravenous 3 times per day  . pantoprazole (PROTONIX) IV  40 mg Intravenous Q24H   Continuous Infusions: . dextrose 5 % and 0.45 % NaCl with KCl 20 mEq/L 75 mL/hr at 06/19/15 0605    Time spent: 15 min   LOS: 9 days   Charlynne Cousins  [facility] Pager [phone]  *Please refer to [email or web address], password [email or web address] to get updated schedule on who will round on this patient, as hospitalists switch teams weekly. If 7PM-7AM, please contact night-coverage at [email or web address], password [email or web address] for any overnight needs.  06/19/2015, 10:49 AM

## 2015-06-19 NOTE — Consult Note (Signed)
WOC ostomy follow up Stoma type/location: LLQ loop colostomy Stomal assessment/size: 3 inches round, raised edematous, sloughing Peristomal assessment: intact Treatment options for stomal/peristomal skin: skin barrier rings over rod Output: serosanguinous Ostomy pouching: 2pc. Pouching system over bridge with 3 skin barrier rings used at periphery of stoma Education provided: None. Supplies (2 set-ups) at bedside. Enrolled patient in [place] Start Discharge program: No WOC nursing team will follow, and will remain available to this patient, the nursing and medical teams.   Thanks, Maudie Flakes, MSN, RN, [place], Arther Abbott  Pager# [phone]

## 2015-06-20 MED ORDER — ENSURE ENLIVE PO LIQD
237.0000 mL | Freq: Two times a day (BID) | ORAL | Status: DC
  Administered 2015-06-20 – 2015-06-25 (×10): 237 mL via ORAL

## 2015-06-20 NOTE — Care Management Note (Signed)
Case Management Note  Patient Details  Name: SILVA SCHOPP MRN: [id number] Date of Birth: 06/30/1930  Subjective/Objective:  POD#3 Loop colostomy, colon resection.[facility] following for d/c-await HHRN/PT/OT/aide/SW order.                  Action/Plan:d/c plan home w/HHC.   Expected Discharge Date:   (unknown)               Expected Discharge Plan:  [place]  In-House Referral:     Discharge planning Services  CM Consult  Post Acute Care Choice:    Choice offered to:  Patient  DME Arranged:    DME Agency:     HH Arranged:  RN, PT, OT, Nurse's Aide [place] Agency:  [place]  Status of Service:  In process, will continue to follow  Medicare Important Message Given:  Yes Date Medicare IM Given:    Medicare IM give by:    Date Additional Medicare IM Given:    Additional Medicare Important Message give by:     If discussed at [place] of Stay Meetings, dates discussed:    Additional Comments:  Dessa Phi, RN 06/20/2015, 3:41 PM

## 2015-06-20 NOTE — Progress Notes (Signed)
[facility] PROGRESS NOTE    Progress Note   TRENE BIEHN [id number] DOB: 25-Oct-1930 DOA: 06/10/2015 PCP: Marton Redwood, MD   Brief Narrative:   Natalie [name] is an 82 y.o. female past medical history of paroxysmal atrial fibrillation on anticoagulation, Alzheimer's dementia presents to [facility] ED for evaluation of her shortness of breath and weakness. She was found to have symptomatic anemia, patient refuse colonoscopy in the past, colonoscopy performed on 06/13/2015 that showed a probable malignant lesion with complete obstruction of the rectal sigmoid colon, biopsy confirmed adenocarcinoma, surgery was consulted and recommended laparoscopy and open loop colostomy on the left upper quadrant performed on 06/18/2015. 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 - Status post colonoscopy performed on 06/13/2015 with biopsy confirming adenocarcinoma. - Gen. surgery was consulted status post laparoscopy and open loop colostomy on the left upper quadrant performed on 06/18/2015. - CT of the chest on 06/13/2015 showed new 8 x 5 mm nodule in the right middle lobe  - Tolerating diet. - CEA is 11.4, physical therapy has been reconsulted.  - Patient has been seen by [facility].  Alzheimer's dementia/organic brain dysfunction: - She is unable to perform her ADLs at home and has poor insight on her medical condition. - Psychiatry was consulted they deemed it patient not to have capacity. - Physical therapy evaluated the patient. - We'll need to discuss with her daughter if she would like to meet with palliative care. - Start night Haldol when necessary. - Daughter not ready to speak about end of life.  Escherichia coli UTI: Pansensitive, she completed a seven-day course of antibiotic in house.  Essential hypertension: Fairly  controlled continue IV hydralazine when necessary, continue schedule metoprolol.  Anxiety state: Continue Ativan when necessary.  COPD (chronic obstructive pulmonary disease) (HCC) Stable.  Encephalopathy, metabolic Haldol PRN for agitation.  DVT Prophylaxis - Lovenox ordered.  Family Communication: daughter Disposition Plan: Home in am Code Status:  Code Status History    Date Active Date Inactive Code Status Order ID Comments User Context   06/10/2015  8:17 PM 06/17/2015  4:00 PM Full Code [id number]  Robbie Lis, MD Inpatient   11/30/2014  8:19 PM 12/04/2014  4:45 PM Full Code [id number]  Ivor Costa, MD ED   08/15/2011  4:31 AM 09/01/2011  5:49 PM Full Code [id number]  Asa Saunas, MD ED    Advance Directive Documentation        Most Recent Value   Type of Advance Directive  Healthcare Power of [place], Living will   Pre-existing out of facility DNR order (yellow form or pink MOST form)     "MOST" Form in Place?          IV Access:    Peripheral IV   Procedures and diagnostic studies:   No results found.   Medical Consultants:    None.  Anti-Infectives:   Anti-infectives    Start     Dose/Rate Route Frequency Ordered Stop   06/17/15 1100  cefoTEtan (CEFOTAN) 2 g in dextrose 5 % 50 mL IVPB     2 g 100 mL/hr over 30 Minutes Intravenous On call to O.R. 06/16/15 1156 06/17/15 1328   06/11/15 2000  cefTRIAXone (ROCEPHIN) 1 g in dextrose 5 % 50 mL IVPB  Status:  Discontinued     1 g 100 mL/hr over 30 Minutes  Intravenous Every 24 hours 06/11/15 1844 06/17/15 1600      Subjective:    Auriah C Sakata no complains.  Objective:    Filed Vitals:   06/19/15 1453 06/19/15 2220 06/19/15 2234 06/20/15 0525  BP: 137/93 159/97  152/91  Pulse: 91 100 87 87  Temp: 98.1 F (36.7 C)  97.9 F (36.6 C) 97.4 F (36.3 C)  TempSrc: Oral  Oral Oral  Resp: 20  18 19   Height:      Weight:      SpO2: 98%  98% 97%    Intake/Output Summary (Last 24 hours) at 06/20/15  1258 Last data filed at 06/20/15 0850  Gross per 24 hour  Intake 1747.5 ml  Output   1475 ml  Net  272.5 ml   Filed Weights   06/15/15 0625 06/16/15 0616 06/17/15 0520  Weight: 59.3 kg (130 lb 11.7 oz) 57.7 kg (127 lb 3.3 oz) 57.4 kg (126 lb 8.7 oz)    Exam: Gen:  NAD Cardiovascular:  RRR. Chest and lungs:   CTAB Abdomen:  Abdomen soft, NT/ND, + BS Extremities:  No edema   Data Reviewed:    Labs: Basic Metabolic Panel:  Recent Labs Lab 06/17/15 0523 06/18/15 0510  NA 128* 128*  K 4.5 4.5  CL 93* 93*  CO2 25 27  GLUCOSE 101* 161*  BUN 10 7  CREATININE 0.82 0.66  CALCIUM 9.1 8.7*   GFR Estimated Creatinine Clearance: 42.9 mL/min (by C-G formula based on Cr of 0.66). Liver Function Tests: No results for input(s): AST, ALT, ALKPHOS, BILITOT, PROT, ALBUMIN in the last 168 hours. No results for input(s): LIPASE, AMYLASE in the last 168 hours. No results for input(s): AMMONIA in the last 168 hours. Coagulation profile No results for input(s): INR, PROTIME in the last 168 hours.  CBC:  Recent Labs Lab 06/17/15 0523 06/18/15 0510  WBC 13.3* 15.4*  HGB 10.5* 10.2*  HCT 33.0* 31.0*  MCV 80.1 77.9*  PLT 602* 536*   Cardiac Enzymes: No results for input(s): CKTOTAL, CKMB, CKMBINDEX, TROPONINI in the last 168 hours. BNP (last 3 results) No results for input(s): PROBNP in the last 8760 hours. CBG:  Recent Labs Lab 06/16/15 0725 06/17/15 0524 06/17/15 0740 06/19/15 1131 06/19/15 1629  GLUCAP 117* 102* 97 129* 104*   D-Dimer: No results for input(s): DDIMER in the last 72 hours. Hgb A1c: No results for input(s): HGBA1C in the last 72 hours. Lipid Profile: No results for input(s): CHOL, HDL, LDLCALC, TRIG, CHOLHDL, LDLDIRECT in the last 72 hours. Thyroid function studies: No results for input(s): TSH, T4TOTAL, T3FREE, THYROIDAB in the last 72 hours.  Invalid input(s): FREET3 Anemia work up: No results for input(s): VITAMINB12, FOLATE, FERRITIN,  TIBC, IRON, RETICCTPCT in the last 72 hours. Sepsis Labs:  Recent Labs Lab 06/17/15 0523 06/18/15 0510  WBC 13.3* 15.4*   Microbiology Recent Results (from the past 240 hour(s))  Culture, Urine     Status: None   Collection Time: 06/11/15  7:00 PM  Result Value Ref Range Status   Specimen Description URINE, RANDOM  Final   Special Requests NONE  Final   Culture   Final    >=100,000 COLONIES/mL ESCHERICHIA COLI Performed at [facility]    Report Status 06/13/2015 FINAL  Final   Organism ID, Bacteria ESCHERICHIA COLI  Final      Susceptibility   Escherichia coli - MIC*    AMPICILLIN 8 SENSITIVE Sensitive     CEFAZOLIN <=4 SENSITIVE  Sensitive     CEFTRIAXONE <=1 SENSITIVE Sensitive     CIPROFLOXACIN <=0.25 SENSITIVE Sensitive     GENTAMICIN <=1 SENSITIVE Sensitive     IMIPENEM <=0.25 SENSITIVE Sensitive     NITROFURANTOIN <=16 SENSITIVE Sensitive     TRIMETH/SULFA <=20 SENSITIVE Sensitive     AMPICILLIN/SULBACTAM 4 SENSITIVE Sensitive     PIP/TAZO <=4 SENSITIVE Sensitive     * >=100,000 COLONIES/mL ESCHERICHIA COLI  Surgical pcr screen     Status: None   Collection Time: 06/17/15  6:42 AM  Result Value Ref Range Status   MRSA, PCR NEGATIVE NEGATIVE Final   Staphylococcus aureus NEGATIVE NEGATIVE Final    Comment:        The Xpert SA Assay (FDA approved for NASAL specimens in patients over 98 years of age), is one component of a comprehensive surveillance program.  Test performance has been validated by [facility] for patients greater than or equal to 8 year old. It is not intended to diagnose infection nor to guide or monitor treatment.      Medications:   . feeding supplement (ENSURE ENLIVE)  237 mL Oral BID BM  . heparin subcutaneous  5,000 Units Subcutaneous 3 times per day  . metoprolol  5 mg Intravenous 3 times per day  . pantoprazole (PROTONIX) IV  40 mg Intravenous Q24H   Continuous Infusions: . dextrose 5 % and 0.45 % NaCl with KCl 20  mEq/L 75 mL/hr at 06/20/15 0537    Time spent: 15 min   LOS: 10 days   Charlynne Cousins  [facility] Pager [phone]  *Please refer to [email or web address], password [email or web address] to get updated schedule on who will round on this patient, as hospitalists switch teams weekly. If 7PM-7AM, please contact night-coverage at [email or web address], password [email or web address] for any overnight needs.  06/20/2015, 12:58 PM

## 2015-06-20 NOTE — Progress Notes (Signed)
Patient ID: Natalie [name], female   DOB: 1931/03/03, 82 y.o.   MRN: [id number]     [facility]      [address] [place], [place] [id number]    Phone: [phone] FAX: [phone]     Subjective: Tolerating POs.  Having ostomy function.  abd sore.   Objective:  Vital signs:  Filed Vitals:   06/19/15 1453 06/19/15 2220 06/19/15 2234 06/20/15 0525  BP: 137/93 159/97  152/91  Pulse: 91 100 87 87  Temp: 98.1 F (36.7 C)  97.9 F (36.6 C) 97.4 F (36.3 C)  TempSrc: Oral  Oral Oral  Resp: 20  18 19   Height:      Weight:      SpO2: 98%  98% 97%    Last BM Date: 06/20/15  Intake/Output   Yesterday:  03/29 0701 - 03/30 0700 In: 1687.5 [P.O.:180; I.V.:1507.5] Out: 1475 [Urine:1250; Stool:225] This shift: I/O last 3 completed shifts: In: 2591.3 [P.O.:180; I.V.:2411.3] Out: 2125 [Urine:1775; Stool:350]    Physical Exam: General: Pt awake and in no acute distress Abdomen: Soft. Nondistended. Mildly tender at incisions only. Stoma is pink swollen, patches of discoloration, white slough, but viable. Stool and air present. No evidence of peritonitis. No incarcerated hernias.    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 Active Problems:   Hypothyroidism   Anxiety state   Essential hypertension   Chronic back pain   Warfarin anticoagulation   History of gastric ulcer   COPD (chronic obstructive pulmonary disease) (HCC)   UTI (urinary tract infection)   Acute upper GI bleed   Encephalopathy, metabolic   Dementia in Alzheimer's disease   Adenocarcinoma of sigmoid colon ([place])    Results:   Labs: Results for orders placed or performed during the hospital encounter of 06/10/15 (from the past 48 hour(s))  Glucose, capillary     Status: Abnormal   Collection Time: 06/19/15 11:31 AM  Result Value Ref Range   Glucose-Capillary 129 (H) 65 - 99 mg/dL  Glucose,  capillary     Status: Abnormal   Collection Time: 06/19/15  4:29 PM  Result Value Ref Range   Glucose-Capillary 104 (H) 65 - 99 mg/dL    Imaging / Studies: No results found.  Medications / Allergies:  Scheduled Meds: . heparin subcutaneous  5,000 Units Subcutaneous 3 times per day  . metoprolol  5 mg Intravenous 3 times per day  . pantoprazole (PROTONIX) IV  40 mg Intravenous Q24H   Continuous Infusions: . dextrose 5 % and 0.45 % NaCl with KCl 20 mEq/L 75 mL/hr at 06/20/15 0537   PRN Meds:.acetaminophen, haloperidol lactate, hydrALAZINE, LORazepam, morphine injection, [DISCONTINUED] ondansetron **OR** ondansetron (ZOFRAN) IV, traMADol  Antibiotics: Anti-infectives    Start     Dose/Rate Route Frequency Ordered Stop   06/17/15 1100  cefoTEtan (CEFOTAN) 2 g in dextrose 5 % 50 mL IVPB     2 g 100 mL/hr over 30 Minutes Intravenous On call to O.R. 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 Hassell Done  -regular diet, IS, pain control, mobilize, WOC consult -PT eval, not sure of baseline  mobility  -tylenol and tramadol for pain  -monitor stoma(viable, sloughing, not necrotic) -will need to remove bridge in 4 days -await pathology VTE prophylaxis-SCD/heparin  Dispo-stable surgically. Per primary team      Erby Pian, [facility] Surgery Pager [phone](7A-4:30P)   06/20/2015 9:58 AM

## 2015-06-20 NOTE — Consult Note (Signed)
WOC ostomy follow up Stoma type/location: LLQ Loop Colostomy with ostomy bridge intact; pouching system applied yesterday is intact. Stomal assessment/size: 3 inches yesterday Peristomal assessment: not seen today Treatment options for stomal/peristomal skin: none today Output: serosanguinous in pouch Ostomy pouching: 2pc. System in place Education provided: None. Patient is alone in room. Enrolled patient in [place] Start Discharge program: No The stoma size, complexity of the ostomy pouching with rod intact and sloughing of stomal mucosa will make this challenging for hone management-at least initially.  I have met the son-in-law (preop), but have not had interactions with the patient's daughter since surgery. At this point, I recommend Rehab facility for a short time until stoma sloughs and shrinks and until predictable pouching can be achieved.  If home care with 24 hour assistance is decided upon, a teaching session will be needed with the person providing care. If CCS MD agrees, [place] nurse can remove bridge on Sunday or Monday which will make pouching ostomy somewhat easier. If desired, please order. [place] nursing team will continue to follow and remain available to this patient, the nursing and medical teams.   Thanks, Maudie Flakes, MSN, RN, [place], Arther Abbott  Pager# [phone]

## 2015-06-20 NOTE — Progress Notes (Signed)
Initial Nutrition Assessment  DOCUMENTATION CODES:   Severe malnutrition in context of acute illness/injury  INTERVENTION:  - Continue Regular diet - Will order Ensure Enlive po BID, each supplement provides 350 kcal and 20 grams of protein - RD will continue to monitor for needs  NUTRITION DIAGNOSIS:   Inadequate protein intake related to other (see comment) (current and previous diet orders) as evidenced by other (see comment) (FLD and CLD orders).  GOAL:   Patient will meet greater than or equal to 90% of their needs  MONITOR:   PO intake, Supplement acceptance, Diet advancement, Weight trends, Labs, Skin, I & O's  REASON FOR ASSESSMENT:   LOS  ASSESSMENT:   82 y.o. female past medical history of paroxysmal atrial fibrillation on anticoagulation, Alzheimer's dementia presents to [facility] ED for evaluation of her shortness of breath and weakness. She was found to have symptomatic anemia, patient refuse colonoscopy in the past, colonoscopy performed on 06/13/2015 that showed a probable malignant lesion with complete obstruction of the rectal sigmoid colon, biopsy confirmed adenocarcinoma, surgery was consulted and recommended laparoscopy and open loop colostomy on the left upper quadrant performed on 06/18/2015. CT of the chest done on 06/13/2015 was compatible with metastatic lesion  Pt seen for LOS. BMI indicates normal weight. Diet changes as follows: 3/20 @2018 : Regular 3/21 @ 0600: CLD 3/21 @ 1237: Dysphagia 3, thin liquids 3/22 @ 0500: CLD 3/23 @ 0001: NPO 3/23 @ 1628: CLD 3/27 @ 0001: NPO 3/28 @ 0906: CLD 3/29 @ [id number]: FLD 3/30 @ [id number]: Regular  Per chart review, pt consumed 10% breakfast and 100% of lunch and dinner 3/29. Pt sleeping at time of visit with no family/visitors present at that time. Pt with hx of Alzheimer's dementia. Did no attempt to arouse pt. Unable to perform physical assessment at this time. Per chart review, pt has lost 21 lbs (14% body weight)  in the past 6.5 months which is significant for time frame. Pt is POD #3 laparoscopic loop colostomy.  Not meeting needs with diet changes as outlined. Will order Ensure to supplement. Medications reviewed. IVF: D5-1/2 NS-20 mEq KCl @ 75 mL/hr (306 kcal). Labs reviewed; Na: 128 mmol/L, Cl: 93 mmol/L, Ca: 8.7 mg/dL.      Diet Order:  Diet regular Room service appropriate?: Yes; Fluid consistency:: Thin  Skin:  Wound (see comment) (Abdominal wound)  Last BM:  3/30  Height:   Ht Readings from Last 1 Encounters:  06/10/15 5\' 2"  (1.575 m)    Weight:   Wt Readings from Last 1 Encounters:  06/17/15 126 lb 8.7 oz (57.4 kg)    Ideal Body Weight:  50 kg (kg)  BMI:  Body mass index is 23.14 kg/(m^2).  Estimated Nutritional Needs:   Kcal:  1725-1900 (30-33 kcal/kg)  Protein:  75-85 grams  Fluid:  2 L/day  EDUCATION NEEDS:   No education needs identified at this time     Jarome Matin, RD, LDN Inpatient Clinical Dietitian Pager # [phone] After hours/weekend pager # [phone]

## 2015-06-20 NOTE — Progress Notes (Signed)
PT Cancellation Note  Patient Details Name: Natalie [name] MRN: [id number] DOB: 10/16/1930   Cancelled Treatment:    Reason Eval/Treat Not Completed: attempted PT tx session. Pt refused to participate despite max encouragement from therapist.    Weston Anna, MPT Pager: [phone]

## 2015-06-21 DIAGNOSIS — E43 Unspecified severe protein-calorie malnutrition: Secondary | ICD-10-CM

## 2015-06-21 DIAGNOSIS — J438 Other emphysema: Secondary | ICD-10-CM

## 2015-06-21 MED ORDER — ENSURE ENLIVE PO LIQD: 237.0000 mL | Freq: Two times a day (BID) | ORAL | Status: DC

## 2015-06-21 NOTE — Discharge Summary (Addendum)
Spoke with Dr. Hassell Done, about Mrs [facility] care and he will like to take over her care. Please call us with any further questions or concerns we'll be happy to see her.

## 2015-06-21 NOTE — Progress Notes (Signed)
Patient ID: Natalie [name], female   DOB: May 21, 1930, 82 y.o.   MRN: [id number]     [facility]      [address] [place], [place] [id number]    Phone: [phone] FAX: [phone]     Subjective: Ostomy functioning.  Anxious.   Objective:  Vital signs:  Filed Vitals:   06/20/15 1400 06/20/15 1502 06/20/15 2242 06/21/15 0411  BP: 177/101 168/91 177/90 161/98  Pulse: 90 93 105 94  Temp: 97.5 F (36.4 C)  98.1 F (36.7 C) 97.5 F (36.4 C)  TempSrc: Oral  Oral Oral  Resp:   20 20  Height:      Weight:      SpO2: 97%  98% 99%    Last BM Date: 06/20/15  Intake/Output   Yesterday:  03/30 0701 - 03/31 0700 In: 2085 [P.O.:360; I.V.:1725] Out: 200 [Stool:200] This shift:    I/O last 3 completed shifts: In: [id number] [P.O.:420; I.V.:2625] Out: 1100 [Urine:800; Stool:300]   Physical Exam: General: Pt awake and in no acute distress Abdomen: Soft. Nondistended. Mildly tender at incisions only. Stoma is pink swollen, patches of discoloration, white slough, but viable. Stool and air present. No evidence of peritonitis. No incarcerated hernias.   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 Active Problems:   Hypothyroidism   Anxiety state   Essential hypertension   Chronic back pain   Warfarin anticoagulation   History of gastric ulcer   COPD (chronic obstructive pulmonary disease) (HCC)   UTI (urinary tract infection)   Acute upper GI bleed   Encephalopathy, metabolic   Dementia in Alzheimer's disease   Adenocarcinoma of sigmoid colon ([place])    Results:   Labs: Results for orders placed or performed during the hospital encounter of 06/10/15 (from the past 48 hour(s))  Glucose, capillary     Status: Abnormal   Collection Time: 06/19/15 11:31 AM  Result Value Ref Range   Glucose-Capillary 129 (H) 65 - 99 mg/dL  Glucose, capillary     Status: Abnormal   Collection  Time: 06/19/15  4:29 PM  Result Value Ref Range   Glucose-Capillary 104 (H) 65 - 99 mg/dL    Imaging / Studies: No results found.  Medications / Allergies:  Scheduled Meds: . feeding supplement (ENSURE ENLIVE)  237 mL Oral BID BM  . heparin subcutaneous  5,000 Units Subcutaneous 3 times per day  . metoprolol  5 mg Intravenous 3 times per day  . pantoprazole (PROTONIX) IV  40 mg Intravenous Q24H   Continuous Infusions: . dextrose 5 % and 0.45 % NaCl with KCl 20 mEq/L 75 mL/hr at 06/21/15 0914   PRN Meds:.acetaminophen, haloperidol lactate, hydrALAZINE, LORazepam, morphine injection, [DISCONTINUED] ondansetron **OR** ondansetron (ZOFRAN) IV, traMADol  Antibiotics: Anti-infectives    Start     Dose/Rate Route Frequency Ordered Stop   06/17/15 1100  cefoTEtan (CEFOTAN) 2 g in dextrose 5 % 50 mL IVPB     2 g 100 mL/hr over 30 Minutes Intravenous On call to O.R. 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 Hassell Done  -regular diet, IS, pain control, mobilize, WOC  consult -tylenol and tramadol for pain  -monitor stoma(viable, sloughing, not necrotic) -will need to remove bridge in 3 days -will need [facility] RN for ostomy care VTE prophylaxis-SCD/heparin  Dispo-per Dr. Tora Duck, [facility] Pager [phone](7A-4:30P)   06/21/2015 9:22 AM

## 2015-06-21 NOTE — Consult Note (Signed)
WOC ostomy follow up Stoma type/location: LLQ Loop Colostomy with ostomy bridge intact. Stomal assessment/size: 3 inches. Edematous, mucosal sloughing, moist Peristomal assessment: Intact with mild erythema at suture site Treatment options for stomal/peristomal skin:3 ostomy barrier ring used around stoma to creat a flat surface  Output liquid brown Ostomy pouching: .2pc.  Education provided:  Patient alone in room. Patient with cognitive impairment Enrolled patient in [place] Start Discharge program: No  WOC team will continue to follow  Thanks, Melba Coon MSN, RN, [employer]

## 2015-06-22 NOTE — Progress Notes (Signed)
5 Days Post-Op  Subjective: Complains only of some soreness. Otherwise she feels ok  Objective: Vital signs in last 24 hours: Temp:  [97.6 F (36.4 C)-98.3 F (36.8 C)] 98.2 F (36.8 C) (04/01 0416) Pulse Rate:  [92-96] 92 (04/01 0416) Resp:  [17-18] 18 (04/01 0416) BP: (149-163)/(76-93) 149/76 mmHg (04/01 0416) SpO2:  [98 %] 98 % (04/01 0416) Last BM Date:  (not known)  Intake/Output from previous day: 03/31 0701 - 04/01 0700 In: 1417.5 [P.O.:480; I.V.:937.5] Out: 250 [Urine:250] Intake/Output this shift:    Resp: clear to auscultation bilaterally Cardio: regular rate and rhythm GI: soft, appropriate tenderness. ostomy pink and productive  Lab Results:  No results for input(s): WBC, HGB, HCT, PLT in the last 72 hours. BMET No results for input(s): NA, K, CL, CO2, GLUCOSE, BUN, CREATININE, CALCIUM in the last 72 hours. PT/INR No results for input(s): LABPROT, INR in the last 72 hours. ABG No results for input(s): PHART, HCO3 in the last 72 hours.  Invalid input(s): PCO2, PO2  Studies/Results: No results found.  Anti-infectives: Anti-infectives    Start     Dose/Rate Route Frequency Ordered Stop   06/17/15 1100  cefoTEtan (CEFOTAN) 2 g in dextrose 5 % 50 mL IVPB     2 g 100 mL/hr over 30 Minutes Intravenous On call to O.R. 06/16/15 1156 06/17/15 1328   06/11/15 2000  cefTRIAXone (ROCEPHIN) 1 g in dextrose 5 % 50 mL IVPB  Status:  Discontinued     1 g 100 mL/hr over 30 Minutes Intravenous Every 24 hours 06/11/15 1844 06/17/15 1600      Assessment/Plan: s/p Procedure(s): LAPAROSCOPY LOOP COLOSTOMY (N/A) Advance diet  Ostomy teaching for pt and family Disposition per Medicine  LOS: 12 days    TOTH III,PAUL S 06/22/2015

## 2015-06-23 NOTE — Progress Notes (Signed)
6 Days Post-Op  Subjective: No complaints. Ate better yesterday  Objective: Vital signs in last 24 hours: Temp:  [97.8 F (36.6 C)-98.2 F (36.8 C)] 97.8 F (36.6 C) (04/02 0600) Pulse Rate:  [90-100] 92 (04/02 0600) Resp:  [16-20] 20 (04/02 0600) BP: (152-158)/(80-94) 155/94 mmHg (04/02 0600) SpO2:  [90 %-99 %] 99 % (04/02 0600) Last BM Date: 06/22/15  Intake/Output from previous day: 04/01 0701 - 04/02 0700 In: 3457.5 [P.O.:720; I.V.:2737.5] Out: 1000 [Stool:1000] Intake/Output this shift:    Resp: clear to auscultation bilaterally Cardio: regular rate and rhythm GI: soft, appropriately tender. ostomy pink and productive  Lab Results:  No results for input(s): WBC, HGB, HCT, PLT in the last 72 hours. BMET No results for input(s): NA, K, CL, CO2, GLUCOSE, BUN, CREATININE, CALCIUM in the last 72 hours. PT/INR No results for input(s): LABPROT, INR in the last 72 hours. ABG No results for input(s): PHART, HCO3 in the last 72 hours.  Invalid input(s): PCO2, PO2  Studies/Results: No results found.  Anti-infectives: Anti-infectives    Start     Dose/Rate Route Frequency Ordered Stop   06/17/15 1100  cefoTEtan (CEFOTAN) 2 g in dextrose 5 % 50 mL IVPB     2 g 100 mL/hr over 30 Minutes Intravenous On call to O.R. 06/16/15 1156 06/17/15 1328   06/11/15 2000  cefTRIAXone (ROCEPHIN) 1 g in dextrose 5 % 50 mL IVPB  Status:  Discontinued     1 g 100 mL/hr over 30 Minutes Intravenous Every 24 hours 06/11/15 1844 06/17/15 1600      Assessment/Plan: s/p Procedure(s): LAPAROSCOPY LOOP COLOSTOMY (N/A) Advance diet. Encourage po's Ostomy care. Consider removing bridge tomorrow Unresectable colon cancer Hopefully will be ready to go home soon   LOS: 13 days    TOTH III,[place] Moise S 06/23/2015

## 2015-06-24 LAB — CBC AND DIFFERENTIAL: WBC: 11.2 10^3/mL

## 2015-06-24 LAB — BASIC METABOLIC PANEL
Anion gap: 8 (ref 5–15)
BUN: 10 mg/dL (ref 6–20)
BUN: 10 mg/dL (ref 4–21)
CALCIUM: 8.9 mg/dL (ref 8.9–10.3)
CHLORIDE: 92 mmol/L — AB (ref 101–111)
CO2: 28 mmol/L (ref 22–32)
CREATININE: 0.66 mg/dL (ref 0.44–1.00)
Creatinine: 0.7 mg/dL (ref 0.5–1.1)
GFR calc Af Amer: >60 mL/min (ref >60)
GFR calc non Af Amer: >60 mL/min (ref >60)
GLUCOSE: 130 mg/dL — AB (ref 65–99)
Glucose: 130 mg/dL
Potassium: 4 mmol/L (ref 3.5–5.1)
Sodium: 128 mmol/L — AB (ref 137–147)
Sodium: 128 mmol/L — ABNORMAL LOW (ref 135–145)

## 2015-06-24 LAB — CBC
HCT: 28.3 % — ABNORMAL LOW (ref 36.0–46.0)
Hemoglobin: 9.1 g/dL — ABNORMAL LOW (ref 12.0–15.0)
MCH: 24.5 pg — ABNORMAL LOW (ref 26.0–34.0)
MCHC: 32.2 g/dL (ref 30.0–36.0)
MCV: 76.3 fL — ABNORMAL LOW (ref 78.0–100.0)
Platelets: 598 10*3/uL — ABNORMAL HIGH (ref 150–400)
RBC: 3.71 MIL/uL — AB (ref 3.87–5.11)
RDW: 14.9 % (ref 11.5–15.5)
WBC: 11.2 10*3/uL — AB (ref 4.0–10.5)

## 2015-06-24 NOTE — Consult Note (Signed)
WOC ostomy follow up Stoma type/location: LLQ Loop Colostomy with bridge. Dr. Ethlyn Gallery note from this morning indicates that bridge removal is considered for tomorrow, post OP Day 7.  I will not change pouch today;  will wait until tomorrow to coincide with  bridge removal. Discussed with Erby Pian, CCS NP.  No family in room for teaching.  Understand that family is now considering SNF placement over home with intermittent HHRN services. [place] nursing team will follow and see tomorrow. We will remain available to this patient, the nursing and medical teams.   Thanks, Maudie Flakes, MSN, RN, [place], Arther Abbott  Pager# [phone]

## 2015-06-24 NOTE — Progress Notes (Signed)
Patient ID: Natalie [name], female   DOB: 07-Oct-1930, 82 y.o.   MRN: [id number]   Spoke with daughter Natalie [name], agreeable with SNF which I think is most appropriate for the patient given level of skills need.  Asking for oncology, dental and palliative care.  Consult to palliative care for goals of care.  Will ask onc and dental, but likely can be seen on OP basis as non urgent.  Cecile Guevara

## 2015-06-24 NOTE — NC FL2 (Signed)
[place] LEVEL OF CARE SCREENING TOOL     IDENTIFICATION  Patient Name: Natalie [name] Birthdate: 11/29/1930 Sex: female Admission Date (Current Location): 06/10/2015  [facility] and [place] Number:  Herbalist and Address:  [facility],  [place] [address], [place]      Provider Number: [id number]  Attending Physician Name and Address:  Md Edison Pace, MD  Relative Name and Phone Number:       Current Level of Care: Hospital Recommended Level of Care: [place] Prior Approval Number:    Date Approved/Denied:   PASRR Number: [id number]  Discharge Plan: SNF    Current Diagnoses: Patient Active Problem List   Diagnosis Date Noted  . Protein-calorie malnutrition, severe 06/21/2015  . 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 Adenocarcinoma of sigmoid colon ([place]) 06/16/2015  . Encephalopathy, metabolic [id number]  . Dementia in Alzheimer's disease 06/12/2015  . Acute upper GI bleed 06/10/2015  . Diverticulitis of colon 12/01/2014  . UTI (urinary tract infection) 12/01/2014  . Elevated INR 11/30/2014  . Peripheral venous insufficiency 11/26/2012  . Swelling of limb 10/31/2012  . COPD (chronic obstructive pulmonary disease) ([place]) 08/31/2011  . Urinary retention 08/28/2011  . History of gastric ulcer 07/24/2011  . Renal mass, left 07/21/2011  . Chronic back pain 07/21/2011  . Atrial fibrillation, persistent with asymptomatic pauses 07/21/2011  . Warfarin anticoagulation 07/21/2011  . Anxiety state 08/08/2007  . Depression 08/08/2007  . CHRONIC HEADACHES 08/08/2007  . Hypothyroidism 01/24/2007  . Essential hypertension 01/24/2007  . Allergic rhinitis 01/24/2007    Orientation RESPIRATION BLADDER Height & Weight     Self, Time  Normal Continent Weight: 126 lb 8.7 oz (57.4 kg) Height:  5\' 2"  (157.5 cm)  BEHAVIORAL SYMPTOMS/MOOD NEUROLOGICAL BOWEL NUTRITION STATUS      Continent Diet  (Regular Diet)  AMBULATORY STATUS COMMUNICATION OF NEEDS Skin   Extensive Assist Verbally Normal                       Personal Care Assistance Level of Assistance  Bathing, Dressing Bathing Assistance: Limited assistance   Dressing Assistance: Limited assistance     Functional Limitations Info             SPECIAL CARE FACTORS FREQUENCY  PT (By licensed PT), OT (By licensed OT)     PT Frequency: 5 OT Frequency: 5            Contractures      Additional Factors Info  Code Status, Allergies Code Status Info: Fullcode Allergies Info: Levofloxacin           Current Medications (06/24/2015):  This is the current hospital active medication list Current Facility-Administered Medications  Medication Dose Route Frequency Provider Last Rate Last Dose  . acetaminophen (TYLENOL) tablet 325-650 mg  325-650 mg Oral Q6H PRN Emina Riebock, NP   650 mg at 06/24/15 0332  . dextrose 5 % and 0.45 % NaCl with KCl 20 mEq/L infusion   Intravenous Continuous Barton Dubois, MD 75 mL/hr at 06/23/15 1647    . feeding supplement (ENSURE ENLIVE) (ENSURE ENLIVE) liquid 237 mL  237 mL Oral BID BM Charlynne Cousins, MD   237 mL at 06/24/15 1121  . haloperidol lactate (HALDOL) injection 1 mg  1 mg Intravenous Q6H PRN Charlynne Cousins, MD   1 mg at 06/19/15 1540  . heparin injection 5,000 Units  5,000 Units Subcutaneous 3 times per day  Johnathan Hausen, MD   5,000 Units at 06/24/15 [id number]  . hydrALAZINE (APRESOLINE) injection 10 mg  10 mg Intravenous Q8H PRN Barton Dubois, MD      . LORazepam (ATIVAN) injection 0.25 mg  0.25 mg Intravenous Q4H PRN Gardiner Barefoot, NP   0.25 mg at 06/23/15 2108  . metoprolol (LOPRESSOR) injection 5 mg  5 mg Intravenous 3 times per day Polly Cobia, RPH   5 mg at 06/24/15 0552  . morphine 2 MG/ML injection 1 mg  1 mg Intravenous Q1H PRN Johnathan Hausen, MD   1 mg at 06/23/15 2102  . ondansetron (ZOFRAN) injection 4 mg  4 mg Intravenous Q6H PRN Johnathan Hausen, MD   4 mg at 06/20/15 0851  . pantoprazole (PROTONIX) injection 40 mg  40 mg Intravenous Q24H Barton Dubois, MD   40 mg at 06/23/15 1710  . traMADol (ULTRAM) tablet 50 mg  50 mg Oral Q6H PRN Emina Riebock, NP   50 mg at 06/24/15 1121     Discharge Medications: Please see discharge summary for a list of discharge medications.  Relevant Imaging Results:  Relevant Lab Results:   Additional Information SSN: [id number]  Standley Brooking, LCSW

## 2015-06-24 NOTE — Consult Note (Signed)
WOC ostomy follow up Stoma type/location: LLQ loop colostomy Stomal assessment/size: slightly less than 3 inches round, sloughing.  Rod in place with 4 sutures.  Dr. Zella Richer gives order this am for bridge removal today. Peristomal assessment: intact with minor erythema and small open wounds from sutures in each quadrant around stoma after removal of bridge. Treatment options for stomal/peristomal skin: skin carrier rings.  Two used to encircle ostomy Output: Large volume of brown stool in pouch. Ostomy pouching: 2pc. 4-inch ostomy pouching system and 2 skin barrier rings Education provided: None to patient. Stomal rod removed after clipping four anchoring sutures. Folded upon itself and slides out easily. Patient tolerated procedure well. Enrolled patient in [place] Start Discharge program: Yes.   [place] nursing team will follow, and will remain available to this patient, the nursing, surgical and medical teams.   Thanks, Maudie Flakes, MSN, RN, [place], Arther Abbott  Pager# [phone]

## 2015-06-24 NOTE — Progress Notes (Signed)
Nutrition Follow-up  DOCUMENTATION CODES:   Severe malnutrition in context of acute illness/injury  INTERVENTION:  - Continue Regular diet - Will order Ensure Enlive po BID, each supplement provides 350 kcal and 20 grams of protein - RD will continue to monitor for needs   NUTRITION DIAGNOSIS:   Inadequate protein intake related to lethargy/confusion as evidenced by meal completion < 50%. -revised  GOAL:   Patient will meet greater than or equal to 90% of their needs -unmet  MONITOR:   PO intake, Supplement acceptance, Labs, Weight trends, Skin, I & O's  ASSESSMENT:   82 y.o. female past medical history of paroxysmal atrial fibrillation on anticoagulation, Alzheimer's dementia presents to [facility] ED for evaluation of her shortness of breath and weakness. She was found to have symptomatic anemia, patient refuse colonoscopy in the past, colonoscopy performed on 06/13/2015 that showed a probable malignant lesion with complete obstruction of the rectal sigmoid colon, biopsy confirmed adenocarcinoma, surgery was consulted and recommended laparoscopy and open loop colostomy on the left upper quadrant performed on 06/18/2015. CT of the chest done on 06/13/2015 was compatible with metastatic lesion  4/3 Pt eating lunch at time of visit. Pt tray included chicken salad sandwich, ice cream, and juice. Pt diet advanced to regular diet on 3/30. Meal completion has been 25-50% since 3/30 and she is receiving Ensure Enlive BID. Pt states she enjoys Ensure and she also has a good appetite. Pt denies N/V and abdominal pain with eating. Per chart, pt is eating well on 4/3. Pt does not have any requests at this time. RD will continue to follow up as needed.   NFPE: Mild fat depletion, moderate muscle depletion, unable to assess edema.   Labs reviewed; Cl 92 mmol/L, CBGs 104-129 mg/dl  Medications reviewed; IVF: D5-1/2 NS-20 mEq KCl @ 75 mL/hr (306 kcal)   3/30 -Pt seen for LOS.  -BMI  indicates normal weight.  -Per chart review, pt consumed 10% breakfast and 100% of lunch and dinner 3/29. -Unable to perform physical assessment at this time.  -Per chart review, pt has lost 21 lbs (14% body weight) in the past 6.5 months which is significant for time frame.  -Pt is POD #3 laparoscopic loop colostomy.    Diet Order:  Diet regular Room service appropriate?: Yes; Fluid consistency:: Thin Diet - low sodium heart healthy  Skin:  Wound (see comment) (abdominal incision)  Last BM:  4/1  Height:   Ht Readings from Last 1 Encounters:  06/10/15 5\' 2"  (1.575 m)    Weight:   Wt Readings from Last 1 Encounters:  06/17/15 126 lb 8.7 oz (57.4 kg)    Ideal Body Weight:  50 kg (kg)  BMI:  Body mass index is 23.14 kg/(m^2).  Estimated Nutritional Needs:   Kcal:  1725-1900 (30-33 kcal/kg)  Protein:  75-85 grams  Fluid:  2 L/day  EDUCATION NEEDS:   No education needs identified at this time  Geoffery Lyons, [place] Dietetic Intern Pager [phone]

## 2015-06-24 NOTE — Progress Notes (Addendum)
Physical Therapy Treatment Patient Details Name: Natalie [name] MRN: [id number] DOB: October 01, 1930 Today's Date: June 29, 2015    History of Present Illness 82 yo female admitted with upper GI bleed, now s/p colostomy;   PMHx: HTN, afib, dementia    PT Comments    Pt continues to be extremely  Deconditioned, strongly recommend SNF if pt and family agreeable  Follow Up Recommendations  SNF (up until now pt&family refusing SNF)     Equipment Recommendations  None recommended by PT    Recommendations for Other Services       Precautions / Restrictions Precautions Precautions: Fall Restrictions Weight Bearing Restrictions: No    Mobility  Bed Mobility Overal bed mobility: Needs Assistance Bed Mobility: Supine to Sit Rolling: Min assist         General bed mobility comments: incr time, verbal  cues for self assist; pt able with incr effort today with max encouragement  Transfers Overall transfer level: Needs assistance Equipment used: Rolling walker (2 wheeled) Transfers: Sit to/from Stand Sit to Stand: Min assist         General transfer comment: Assist to rise, stabilize, control descent. Multimodal cues for safety, hand placement, technique. Stand pivot, bed to bsc, with RW.  Ambulation/Gait Ambulation/Gait assistance: Min assist Ambulation Distance (Feet): 7 Feet Assistive device: Rolling walker (2 wheeled) Gait Pattern/deviations: Shuffle;Trunk flexed;Decreased step length - right;Decreased step length - left     General Gait Details: Assist to stabilize and maneuver with walker. Very unsteady.     Stairs            Wheelchair Mobility    Modified Rankin (Stroke Patients Only)       Balance                                    Cognition Arousal/Alertness: Awake/alert Behavior During Therapy: WFL for tasks assessed/performed Overall Cognitive Status: History of cognitive impairments - at baseline Area of Impairment: Problem  solving;Awareness;Safety/judgement     Memory: Decreased short-term memory   Safety/Judgement: Decreased awareness of safety;Decreased awareness of deficits   Problem Solving: Decreased initiation;Difficulty sequencing;Requires verbal cues;Requires tactile cues      Exercises  APs x 10, HS x10    General Comments        Pertinent Vitals/Pain Pain Assessment: No/denies pain    Home Living                      Prior Function            PT Goals (current goals can now be found in the care plan section) Acute Rehab PT Goals Patient Stated Goal: none stated PT Goal Formulation: With patient Time For Goal Achievement: 06/19/15 Potential to Achieve Goals: Good Progress towards PT goals: Progressing toward goals    Frequency  Min 3X/week    PT Plan Current plan remains appropriate    Co-evaluation             End of Session Equipment Utilized During Treatment: Gait belt Activity Tolerance: Patient limited by fatigue Patient left: in bed;with call bell/phone within reach;with chair alarm set     Time: [id number] PT Time Calculation (min) (ACUTE ONLY): 26 min  Charges:  $Therapeutic Activity: 8-22 mins                    G Codes:      Yoshiaki Kreuser 2015-06-29,  12:57 PM

## 2015-06-24 NOTE — Clinical Social Work Placement (Signed)
   CLINICAL SOCIAL WORK PLACEMENT  NOTE  Date:  06/24/2015  Patient Details  Name: Natalie [name] MRN: [id number] Date of Birth: 1931-01-11  Clinical Social Work is seeking post-discharge placement for this patient at the [place] level of care (*CSW will initial, date and re-position this form in  chart as items are completed):  Yes   Patient/family provided with [place] Work Department's list of facilities offering this level of care within the geographic area requested by the patient (or if unable, by the patient's family).  Yes   Patient/family informed of their freedom to choose among providers that offer the needed level of care, that participate in Medicare, Medicaid or managed care program needed by the patient, have an available bed and are willing to accept the patient.  Yes   Patient/family informed of [facility]'s ownership interest in [facility] and [facility], as well as of the fact that they are under no obligation to receive care at these facilities.  PASRR submitted to EDS on       PASRR number received on       Existing PASRR number confirmed on 06/24/15     FL2 transmitted to all facilities in geographic area requested by pt/family on 06/24/15     FL2 transmitted to all facilities within larger geographic area on       Patient informed that his/her managed care company has contracts with or will negotiate with certain facilities, including the following:            Patient/family informed of bed offers received.  Patient chooses bed at       Physician recommends and patient chooses bed at      Patient to be transferred to   on  .  Patient to be transferred to facility by       Patient family notified on   of transfer.  Name of family member notified:        PHYSICIAN       Additional Comment:    _______________________________________________ Standley Brooking, LCSW 06/24/2015, 11:28 AM

## 2015-06-24 NOTE — Care Management Note (Signed)
Case Management Note  Patient Details  Name: Natalie [name] MRN: [id number] Date of Birth: Feb 12, 1931  Subjective/Objective: Per notes patient's dtr now wanting SNF for d/c. CSW following. [facility] has been asked to let dtr know that there is no co pay for [facility] services.                   Action/Plan:d/c SNF.   Expected Discharge Date:   (unknown)               Expected Discharge Plan:  [place]  In-House Referral:     Discharge planning Services  CM Consult  Post Acute Care Choice:    Choice offered to:  Patient  DME Arranged:    DME Agency:     HH Arranged:  RN, PT, OT, Nurse's Aide, Social Work [employer] Agency:  [place]  Status of Service:  In process, will continue to follow  Medicare Important Message Given:  Yes Date Medicare IM Given:    Medicare IM give by:    Date Additional Medicare IM Given:    Additional Medicare Important Message give by:     If discussed at [place] of Stay Meetings, dates discussed:    Additional Comments:  Dessa Phi, RN 06/24/2015, 11:45 AM

## 2015-06-24 NOTE — Clinical Social Work Note (Signed)
Clinical Social Work Assessment  Patient Details  Name: Natalie [name] MRN: [id number] Date of Birth: 1930-12-05  Date of referral:  06/24/15               Reason for consult:  Facility Placement                Permission sought to share information with:  Chartered certified accountant granted to share information::  Yes, Verbal Permission Granted  Name::        Agency::     Relationship::     Contact Information:     Housing/Transportation Living arrangements for the past 2 months:  Single Family Home Source of Information:  Adult Children Patient Interpreter Needed:  None Criminal Activity/Legal Involvement Pertinent to Current Situation/Hospitalization:  No - Comment as needed Significant Relationships:  Adult Children Lives with:  Self Do you feel safe going back to the place where you live?  No Need for family participation in patient care:  Yes (Comment)  Care giving concerns:  CSW received call from patient's daughter, Natalie [name] that patient's family is requesting SNF at this time.    Social Worker assessment / plan:  CSW spoke with patient's daughter and confirmed that they are requesting SNF, CSW sent information out to [facility].   Employment status:  Retired Nurse, adult PT Recommendations:  [place] / Referral to community resources:  [place]  Patient/Family's Response to care:  Patient had been to [facility] in the past, but daughter would like to see what other facilities have availability.   Patient/Family's Understanding of and Emotional Response to Diagnosis, Current Treatment, and Prognosis:  Patient's daughter informed CSW that patient is not aware of cancer diagnosis.   Emotional Assessment Appearance:  Appears stated age Attitude/Demeanor/Rapport:    Affect (typically observed):    Orientation:  Oriented to Self Alcohol / Substance use:    Psych  involvement (Current and /or in the community):     Discharge Needs  Concerns to be addressed:    Readmission within the last 30 days:    Current discharge risk:    Barriers to Discharge:      Standley Brooking, LCSW 06/24/2015, 11:23 AM

## 2015-06-24 NOTE — Progress Notes (Addendum)
7 Days Post-Op  Subjective: Eating well.  Some discomfort around colostomy site.  She does not feel that family can take care of her in her current state.  Objective: Vital signs in last 24 hours: Temp:  [98.1 F (36.7 C)-98.4 F (36.9 C)] 98.4 F (36.9 C) (04/03 0345) Pulse Rate:  [67-93] 80 (04/03 0558) Resp:  [16-18] 18 (04/03 0345) BP: (102-150)/(47-90) 150/80 mmHg (04/03 0558) SpO2:  [98 %] 98 % (04/03 0345) Last BM Date: 06/22/15  Intake/Output from previous day: 04/02 0701 - 04/03 0700 In: 1560 [P.O.:360; I.V.:1200] Out: 700 [Stool:700] Intake/Output this shift:    PE: General- In NAD Abdomen-soft, loop colostomy with some swelling and stool output  Lab Results:  No results for input(s): WBC, HGB, HCT, PLT in the last 72 hours. BMET No results for input(s): NA, K, CL, CO2, GLUCOSE, BUN, CREATININE, CALCIUM in the last 72 hours. PT/INR No results for input(s): LABPROT, INR in the last 72 hours. Comprehensive Metabolic Panel:    Component Value Date/Time   NA 128* 06/18/2015 0510   NA 128* 06/17/2015 0523   K 4.5 06/18/2015 0510   K 4.5 06/17/2015 0523   CL 93* 06/18/2015 0510   CL 93* 06/17/2015 0523   CO2 27 06/18/2015 0510   CO2 25 06/17/2015 0523   BUN 7 06/18/2015 0510   BUN 10 06/17/2015 0523   CREATININE 0.66 06/18/2015 0510   CREATININE 0.82 06/17/2015 0523   GLUCOSE 161* 06/18/2015 0510   GLUCOSE 101* 06/17/2015 0523   CALCIUM 8.7* 06/18/2015 0510   CALCIUM 9.1 06/17/2015 0523   AST 20 06/12/2015 0455   AST 18 06/11/2015 0527   ALT 13* 06/12/2015 0455   ALT 15 06/11/2015 0527   ALKPHOS 59 06/12/2015 0455   ALKPHOS 60 06/11/2015 0527   BILITOT 0.3 06/12/2015 0455   BILITOT 0.4 06/11/2015 0527   PROT 5.9* 06/12/2015 0455   PROT 6.2* 06/11/2015 0527   ALBUMIN 2.9* 06/12/2015 0455   ALBUMIN 3.2* 06/11/2015 0527     Studies/Results: No results found.  Anti-infectives: Anti-infectives    Start     Dose/Rate Route Frequency Ordered Stop    06/17/15 1100  cefoTEtan (CEFOTAN) 2 g in dextrose 5 % 50 mL IVPB     2 g 100 mL/hr over 30 Minutes Intravenous On call to O.R. 06/16/15 1156 06/17/15 1328   06/11/15 2000  cefTRIAXone (ROCEPHIN) 1 g in dextrose 5 % 50 mL IVPB  Status:  Discontinued     1 g 100 mL/hr over 30 Minutes Intravenous Every 24 hours 06/11/15 1844 06/17/15 1600      Assessment Active Problems:   Obstructing sigmoid colon cancer s/p loop colostomy 06/17/15 (Dr. Raynald Blend well; colostomy working well   Chronic atrial fibrillation   Chronic anticoagulation   COPD (chronic obstructive pulmonary disease) ([place])   Dementia in Alzheimer's disease    Protein-calorie malnutrition, severe    LOS: 14 days   Plan: Believe that SNF would be best for her.  Given the LGI bleed from the tumor, it is best to hold on the chronic anticoagulation for now and accept the small risk of stroke.   Natalie [name] J 06/24/2015

## 2015-06-24 NOTE — Clinical Social Work Placement (Signed)
CSW provided patient's daughter, [name] (cell#: [phone]) with SNF bed offers. Daughter plans to tour tomorrow & get back to CSW with SNF decision. Daughter states that she is waiting for oncologist consult to explore all options.    Raynaldo Opitz, [facility] Clinical Social Worker cell #: [phone]     CLINICAL SOCIAL WORK PLACEMENT  NOTE  Date:  06/24/2015  Patient Details  Name: Natalie [name] MRN: [id number] Date of Birth: 02-05-1931  Clinical Social Work is seeking post-discharge placement for this patient at the [place] level of care (*CSW will initial, date and re-position this form in  chart as items are completed):  Yes   Patient/family provided with [facility] Work Department's list of facilities offering this level of care within the geographic area requested by the patient (or if unable, by the patient's family).  Yes   Patient/family informed of their freedom to choose among providers that offer the needed level of care, that participate in Medicare, Medicaid or managed care program needed by the patient, have an available bed and are willing to accept the patient.  Yes   Patient/family informed of [facility]'s ownership interest in [facility] and [facility], as well as of the fact that they are under no obligation to receive care at these facilities.  PASRR submitted to EDS on       PASRR number received on       Existing PASRR number confirmed on 06/24/15     FL2 transmitted to all facilities in geographic area requested by pt/family on 06/24/15     FL2 transmitted to all facilities within larger geographic area on       Patient informed that his/her managed care company has contracts with or will negotiate with certain facilities, including the following:        Yes   Patient/family informed of bed offers received.  Patient chooses bed at       Physician recommends and patient  chooses bed at      Patient to be transferred to   on  .  Patient to be transferred to facility by       Patient family notified on   of transfer.  Name of family member notified:        PHYSICIAN       Additional Comment:    _______________________________________________ Standley Brooking, LCSW 06/24/2015, 3:49 PM

## 2015-06-25 MED ORDER — OXYCODONE HCL 5 MG PO TABS: 5.0000 mg | ORAL_TABLET | Freq: Four times a day (QID) | ORAL | Status: DC | PRN

## 2015-06-25 NOTE — Discharge Summary (Signed)
[facility] Discharge Summary   Patient ID: ADDALYN GLADMAN MRN: [id number] DOB/AGE: 11-26-1930 82 y.o.  Admit date: 06/10/2015 Discharge date: 06/25/2015  Admitting Diagnosis: Distal sigmoid colon mass with obstruction  Discharge Diagnosis Patient Active Problem List   Diagnosis Date Noted  . Protein-calorie malnutrition, severe 06/21/2015  . 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 Adenocarcinoma of sigmoid colon ([place]) 06/16/2015  . Encephalopathy, metabolic [id number]  . Dementia in Alzheimer's disease 06/12/2015  . Acute upper GI bleed 06/10/2015  . Diverticulitis of colon 12/01/2014  . UTI (urinary tract infection) 12/01/2014  . Elevated INR 11/30/2014  . Peripheral venous insufficiency 11/26/2012  . Swelling of limb 10/31/2012  . COPD (chronic obstructive pulmonary disease) ([place]) 08/31/2011  . Urinary retention 08/28/2011  . History of gastric ulcer 07/24/2011  . Renal mass, left 07/21/2011  . Chronic back pain 07/21/2011  . Atrial fibrillation, persistent with asymptomatic pauses 07/21/2011  . Warfarin anticoagulation 07/21/2011  . Anxiety state 08/08/2007  . Depression 08/08/2007  . CHRONIC HEADACHES 08/08/2007  . Hypothyroidism 01/24/2007  . Essential hypertension 01/24/2007  . Allergic rhinitis 01/24/2007    Consultants Dr. Cristina Gong - GI Dr. Louretta Shorten - Psychiatry Dr. Irish Lack - Cardiology Dr. Charlies Silvers, Dr. Dyann Kief, Dr. Olevia Bowens- Internal Medicine  Imaging: No results found.  Procedures Dr. Hassell Done (06/18/15) - Laparoscopy and open-loop colostomy in the left upper quadrant  Hospital Course:  82 y/o white female with PMH significant for dementia, atrial fibrillation on anticoagulation with Pradaxa, HTN, depression, hypothyroidism who presented to [facility] for evaluation of ongoing shortness of breath, weakness, and reports of melanotic stool for last 1-2 weeks prior to this admission. She reported no gross blood per rectum. No  reports of abdominal pain, nausea or vomiting. She did report intermittent episodes of diarrhea. No reports of lightheadedness or loss of consciousness. No reports of chest pain or palpitations. No fevers or cough or chills.  In ED, patient was hemodynamically stable, afebrile and blood pressure 145/80. Blood work was significant for hemoglobin of 9.7, platelets 458, normal creatinine. INR was 1.40. Chest x-ray showed no acute cardiopulmonary process. Distal sigmoid mass identified on CT scan 11/2014 which was re-confirmed on CT abd on 06/13/15. CT chest 3/23 along with CT chest which showed an 8 mm right middle lobe nodule is new and suspicious for metastasis.  Unchanged left renal mass compatible with renal cell carcinoma.  Colonoscopy 06/13/15 shows near-obstructing mass in rectosigmoid. Biopsies taken, likely carcinoma. General surgery asked to evaluate. Previous open cholecystectomy and appendectomy per medical record.  Workup showed obstruction of rectosigmoid colon by neoplasm likely carcinoma.  CEA was found to be 11.4.  Patient was admitted and eventually underwent the palliative procedure listed above.  Tolerated procedure well and was transferred to the floor.  Her cancer was unresectable and she required a palliative loop colostomy.  Diet was advanced as tolerated.  Colonoscopy pathology came back with adenocarcinoma.  Her ostomy is functioning well, edematous with evident slough, but no necrosis.  Her ileostomy bridge has been removed.  On POD #8, the patient was voiding well, tolerating diet, ambulating well, pain well controlled, vital signs stable, incisions c/d/i and felt stable for discharge to SNF.  Patient will follow up in our office in 2-3 weeks and knows to call with questions or concerns.  Her daughter will call to confirm appointment date/time with Dr. Hassell Done.  Mrs. Coward, coordinator at the cancer center is in the process of making a follow up appointment for her.  She can follow up  with her dentist upon discharge.  I have requested that she have consultation by palliative care team while at the SNF so that her daughter can discuss goals of care for her mother.  The patient has dementia and is not fully aware of her cancer per the daughter.  I discussed all of this with the daughter on the phone and she is thankful for our care of the patient.  She will follow up with each specialist after her mother is discharged.     Medication List    STOP taking these medications        amoxicillin-clavulanate 875-125 MG tablet  Commonly known as:  AUGMENTIN     apixaban 5 MG Tabs tablet  Commonly known as:  ELIQUIS      TAKE these medications        ALPRAZolam 0.25 MG tablet  Commonly known as:  XANAX  Take 0.25 mg by mouth every 8 (eight) hours as needed for anxiety.     benazepril 40 MG tablet  Commonly known as:  LOTENSIN  TAKE 1 TABLET BY MOUTH EVERY DAY *MUST KEEP 04-05-14 APPOINTMENT*     bisoprolol 5 MG tablet  Commonly known as:  ZEBETA  Take 5 mg by mouth daily.     bisoprolol 10 MG tablet  Commonly known as:  ZEBETA  Take 1 tablet (10 mg total) by mouth daily.     cloNIDine 0.1 MG tablet  Commonly known as:  CATAPRES  Take 0.1 mg by mouth daily. Reported on 06/10/2015     feeding supplement (ENSURE ENLIVE) Liqd  Take 237 mLs by mouth 2 (two) times daily between meals.     imipramine 10 MG tablet  Commonly known as:  TOFRANIL  Take 30 mg by mouth at bedtime.     lansoprazole 30 MG capsule  Commonly known as:  PREVACID  Take 30 mg by mouth daily at 12 noon.     oxyCODONE 5 MG immediate release tablet  Commonly known as:  Oxy IR/ROXICODONE  Take 1 tablet (5 mg total) by mouth every 6 (six) hours as needed for moderate pain, severe pain or breakthrough pain.     spironolactone 25 MG tablet  Commonly known as:  ALDACTONE  Take 1 tablet (25 mg total) by mouth 2 (two) times daily.     traMADol 50 MG tablet  Commonly known as:  ULTRAM  Take 50 mg by  mouth every 8 (eight) hours as needed (pain.). for pain         Follow-up Information    Call Pedro Earls, MD.   Specialty:  General Surgery   Why:  For post-operation check in 2-3 weeks.  Please call the office to verify your appointment date/time.   Contact information:   [address]  [place] [address] [phone]       Schedule an appointment as soon as possible for a visit to follow up.   Why:  For post-hospital follow up with your Dentist as able regarding the teeth concerns      Schedule an appointment as soon as possible for a visit with [place].   Why:  For post-operation check [facility] [place], [place], [place] [address]).  [phone].  Please call to arrange an appointment date and time for an oncology followup.      Follow up with [facility].   Specialty:  Skilled Nursing  Facility   Contact information:   [place] [phone]      Signed: Nat Christen, [facility] [phone]  06/25/2015, 2:57 PM

## 2015-06-25 NOTE — Care Management Important Message (Signed)
Important Message  Patient Details  Name: Natalie [name] MRN: [id number] Date of Birth: [birth year]   Medicare Important Message Given:  Yes    Camillo Flaming 06/25/2015, 10:29 AMImportant Message  Patient Details  Name: Natalie [name] MRN: [id number] Date of Birth: [birth year]   Medicare Important Message Given:  Yes    Camillo Flaming 06/25/2015, 10:28 AM

## 2015-06-25 NOTE — Care Management Note (Signed)
Case Management Note  Patient Details  Name: Natalie [name] MRN: [id number] Date of Birth: [birth year]  Subjective/Objective:  Paged [facility] about d/c-they will put in d/c order-asked abut SNF-informed that CSW is following w/SNF bed availability.  I spoke to dtr on phone to inform of d/c-she states that she has this afternoon to tour facility-she is aware that there is only 1 bed available-informed of d/c also.                  Action/Plan:d/c plan SNF.   Expected Discharge Date:   (unknown)               Expected Discharge Plan:  [place]  In-House Referral:     Discharge planning Services  CM Consult  Post Acute Care Choice:    Choice offered to:  Patient  DME Arranged:    DME Agency:     HH Arranged:  RN, PT, OT, Nurse's Aide, Social Work [employer] Agency:  [place]  Status of Service:  In process, will continue to follow  Medicare Important Message Given:  Yes Date Medicare IM Given:    Medicare IM give by:    Date Additional Medicare IM Given:    Additional Medicare Important Message give by:     If discussed at [place] of Stay Meetings, dates discussed:    Additional Comments:  Dessa Phi, RN 06/25/2015, 12:38 PM

## 2015-06-25 NOTE — Consult Note (Signed)
WOC ostomy consult note Stoma type/location: LLQ loop colostomy. Bridge removed yesterday without incident. Stomal assessment/size: slightly less than 3 inches round, sloughing of mucosa is evident. Peristomal assessment: Not seen today Treatment options for stomal/peristomal skin: None today (skin barrier rings (2) placed yesterday around stoma. Output Brown stool Ostomy pouching: 2pc. 4-inch pouching system applied yesterday is intact and staff are monitoring and emptying as indicated. Education provided: None Enrolled patient in [place] program: Yes.  No signature card obtained, but supplies may certainly be an issue in the post acute setting, so she is registered with the [employer] Program for provision to the next care setting of information and sample supplies that are current and what I believe will be the next likely supply when edematous stoma begins shrinking.  [place] nursing team will follow, and will remain available to this patient, the nursing, srugical and medical teams.   Thanks, Maudie Flakes, MSN, RN, [place], Arther Abbott  Pager# [phone]

## 2015-06-25 NOTE — Progress Notes (Addendum)
Patient was stable at time of transport via ptar. Daughter was at bedside, belongings were taken home. O2 saturation assessed on Ra prior to DC, 95-100%. PTAR encouraged to keep O2 on standby should it be needed. [facility] contacted, Report given to karen, RN

## 2015-06-25 NOTE — Progress Notes (Signed)
[facility] Progress Note  8 Days Post-Op  Subjective: Sitting up in bed, very pleasant.  Denies any pain or N/V, tolerating her lunch well.  Stool and flatus output from ileostomy.  No complaints.    Objective: Vital signs in last 24 hours: Temp:  [97.7 F (36.5 C)-98.2 F (36.8 C)] 97.7 F (36.5 C) (04/04 0434) Pulse Rate:  [68-98] 75 (04/04 0434) Resp:  [16-18] 16 (04/04 0434) BP: (104-141)/(70-88) 138/76 mmHg (04/04 0434) SpO2:  [96 %-98 %] 97 % (04/04 0434) Last BM Date: 06/22/15  Intake/Output from previous day: 04/03 0701 - 04/04 0700 In: 2400 [I.V.:2400] Out: -  Intake/Output this shift:    PE: Gen:  Alert, NAD, pleasant Card:  regularly irregular, no M/G/R heard Pulm:  CTA, no W/R/R Abd: Soft, appropriately tender, ND, +BS, no HSM, incisions C/D/I, ileostomy in left abdomen is quite edematous, bag with flatus and some BM.  Stoma has yellow/white slough but no necrosis. Ext:  No erythema, edema, or tenderness  Lab Results:   Recent Labs  06/24/15 1216  WBC 11.2*  HGB 9.1*  HCT 28.3*  PLT 598*   BMET  Recent Labs  06/24/15 1216  NA 128*  K 4.0  CL 92*  CO2 28  GLUCOSE 130*  BUN 10  CREATININE 0.66  CALCIUM 8.9   PT/INR No results for input(s): LABPROT, INR in the last 72 hours. CMP     Component Value Date/Time   NA 128* 06/24/2015 1216   K 4.0 06/24/2015 1216   CL 92* 06/24/2015 1216   CO2 28 06/24/2015 1216   GLUCOSE 130* 06/24/2015 1216   BUN 10 06/24/2015 1216   CREATININE 0.66 06/24/2015 1216   CALCIUM 8.9 06/24/2015 1216   PROT 5.9* 06/12/2015 0455   ALBUMIN 2.9* 06/12/2015 0455   AST 20 06/12/2015 0455   ALT 13* 06/12/2015 0455   ALKPHOS 59 06/12/2015 0455   BILITOT 0.3 06/12/2015 0455   GFRNONAA >60 06/24/2015 1216   GFRAA >60 06/24/2015 1216   Lipase     Component Value Date/Time   LIPASE 38 06/10/2015 1131       Studies/Results: No results found.  Anti-infectives: Anti-infectives    Start      Dose/Rate Route Frequency Ordered Stop   06/17/15 1100  cefoTEtan (CEFOTAN) 2 g in dextrose 5 % 50 mL IVPB     2 g 100 mL/hr over 30 Minutes Intravenous On call to O.R. 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 Hassell Done 06/17/15 -regular diet, IS, pain control, mobilize -tylenol and tramadol for pain -monitor stoma (viable, edematous, sloughing, not necrotic) -bridge removed yesterday, WOC following -awaiting SNF bed placement -Palliative care consult, but they have not yet seen her, they can continue this discussion as an outpatient, consults for dental and oncology as an outpatient per specialist  AFIB on Elliquis - It is best to hold for now given bleed from the tumor Alzheimer's dementia/organic brain dysfunction E.coli UTI - finished 7 day course HTN Anxiety COPD Encephalopathy VTE prophylaxis-SCD/heparin  Dispo - To SNF today when bed available    LOS: 15 days  Nat Christen 06/25/2015, 11:48 AM Pager: [phone]  (7am - 4:30pm M-F; 7am - 11:30am Sa/Su)

## 2015-06-25 NOTE — Progress Notes (Addendum)
Follow up call placed to [facility], spoke to Santiago Glad, Therapist, sports. Ostomy supply numbers reported [employer] 2 piece, # [id number], 102 mm 4 in, # [id number] 4in 3 1/2 L.

## 2015-06-25 NOTE — Progress Notes (Signed)
Patient is set to discharge to [facility] today. Patient & daughter, Natalie [name] made aware. Discharge packet given to RN, [employer]. PTAR called for transport.     Natalie [name], [facility] Clinical Social Worker cell #: [phone]

## 2015-06-26 ENCOUNTER — Telehealth: Payer: Self-pay | Admitting: *Deleted

## 2015-06-26 ENCOUNTER — Encounter: Payer: Self-pay | Admitting: Internal Medicine

## 2015-06-26 ENCOUNTER — Non-Acute Institutional Stay (SKILLED_NURSING_FACILITY): Payer: Medicare Other | Admitting: Internal Medicine

## 2015-06-26 DIAGNOSIS — F0390 Unspecified dementia without behavioral disturbance: Secondary | ICD-10-CM

## 2015-06-26 DIAGNOSIS — K219 Gastro-esophageal reflux disease without esophagitis: Secondary | ICD-10-CM | POA: Diagnosis not present

## 2015-06-26 DIAGNOSIS — D75839 Thrombocytosis, unspecified: Secondary | ICD-10-CM

## 2015-06-26 DIAGNOSIS — E871 Hypo-osmolality and hyponatremia: Secondary | ICD-10-CM

## 2015-06-26 DIAGNOSIS — I10 Essential (primary) hypertension: Secondary | ICD-10-CM | POA: Diagnosis not present

## 2015-06-26 DIAGNOSIS — D72829 Elevated white blood cell count, unspecified: Secondary | ICD-10-CM | POA: Diagnosis not present

## 2015-06-26 DIAGNOSIS — D473 Essential (hemorrhagic) thrombocythemia: Secondary | ICD-10-CM

## 2015-06-26 DIAGNOSIS — I509 Heart failure, unspecified: Secondary | ICD-10-CM

## 2015-06-26 DIAGNOSIS — F329 Major depressive disorder, single episode, unspecified: Secondary | ICD-10-CM | POA: Diagnosis not present

## 2015-06-26 DIAGNOSIS — K56609 Unspecified intestinal obstruction, unspecified as to partial versus complete obstruction: Secondary | ICD-10-CM

## 2015-06-26 DIAGNOSIS — E46 Unspecified protein-calorie malnutrition: Secondary | ICD-10-CM

## 2015-06-26 DIAGNOSIS — K5660 Unspecified intestinal obstruction: Secondary | ICD-10-CM | POA: Diagnosis not present

## 2015-06-26 DIAGNOSIS — F32A Depression, unspecified: Secondary | ICD-10-CM

## 2015-06-26 DIAGNOSIS — C189 Malignant neoplasm of colon, unspecified: Secondary | ICD-10-CM

## 2015-06-26 DIAGNOSIS — D62 Acute posthemorrhagic anemia: Secondary | ICD-10-CM

## 2015-06-26 DIAGNOSIS — R5381 Other malaise: Secondary | ICD-10-CM | POA: Diagnosis not present

## 2015-06-26 NOTE — Progress Notes (Signed)
LOCATION: [place]  PCP: Marton Redwood, MD   Code Status: DNR  Goals of care: Advanced Directive information Advanced Directives 06/26/2015  Does patient have an advance directive? Yes  Type of Advance Directive Out of facility DNR (pink MOST or yellow form)  Does patient want to make changes to advanced directive? No - Patient declined  Copy of advanced directive(s) in chart? Yes      Extended Emergency Contact Information Primary Emergency Contact: Parker,Kathleen Address: [place], [place] [address] [place] of [place] Phone: [phone] Mobile Phone: [phone] Relation: Daughter Secondary Emergency Contact: High,Paula Address: [address]          [place],  [address] [place]itter of [place] Phone: [phone] Mobile Phone: [phone] Relation: Daughter   Allergies  Allergen Reactions  . Levofloxacin Other (See Comments)    REACTION: unspecified per [facility]    Chief Complaint  Patient presents with  . New Admit To SNF    New Admission     HPI:  Patient is a 82 y.o. female seen today for short term rehabilitation post hospital admission from 06/10/15- 06/25/15 with melanotic stools. She was started on iv fluids. Her coumadin was held and she was started on iv protonix. She underwent colonoscopy 06/13/15 showing near obstructing mass in rectosigmoid. Biopsies were taken and revealed adenocarcinoma. She underwent laproscopy and open loop colostomy in left upper quadrant. She has PMH of afib, dementia, HTN, hypothyroidism. She is seen in her room today.   Review of Systems:  Constitutional: Negative for fever, chills, diaphoresis. Feels weak and tired. HENT: Negative for congestion, nasal discharge, hearing loss, sore throat, difficulty swallowing. Positive for ocassional headache   Eyes: Negative for blurred vision, double vision and discharge.  Respiratory: Negative for cough, shortness of breath and wheezing.     Cardiovascular: Negative for chest pain, palpitations, leg swelling.  Gastrointestinal: Negative for heartburn, nausea, vomiting, abdominal pain, loss of appetite. Genitourinary: Negative for dysuria and flank pain.  Musculoskeletal: Negative for back pain, fall in the facility.  Skin: Negative for itching, rash.  Neurological: Negative for dizziness. Psychiatric/Behavioral: Negative for depression   Past Medical History  Diagnosis Date  . Osteoporosis   . Hypertension   . Hypothyroidism   . Lower GI bleeding 07/21/11  . Atrial fibrillation ([place])   . COPD (chronic obstructive pulmonary disease) ([place])   . Pneumonia   . Shortness of breath 07/21/11    "anytime"  . Sinus headache 07/21/11    "all the time"  . Bladder infection, chronic   . Arthritis     "everywhere"  . Anxiety   . Chronic lower back pain   . Chronic abdominal pain   . Renal mass, left 2013  . PUD (peptic ulcer disease)   . Chronic cystitis   . Trigeminal neuralgia     /E-chart  . Chronic kidney disease   . Anemia   . Respiratory failure, extubated 08/15/11 08/21/2011  . Normal left ventricular systolic function by 2D [id number] 08/21/2011   Past Surgical History  Procedure Laterality Date  . Vaginal hysterectomy      partial  . Cataract extraction w/ intraocular lens  implant, bilateral    . Percutaneous pinning femoral neck fracture  11/2003    w/closed reduction ; left/E-chart  . Laceration repair  11/2003    left eyebrow  . Bilateral salpingoophorectomy  12/2005    lap/E-chart  . Cholecystectomy  1960  . Appendectomy      presumed/E-chart  . Esophagogastroduodenoscopy  07/23/2011    Procedure: ESOPHAGOGASTRODUODENOSCOPY (EGD);  Surgeon: Wonda Horner, MD;  Location: [facility] ENDOSCOPY;  Service: Endoscopy;  Laterality: N/A;  . Sigmoidoscopy  06/13/2015    Procedure: SIGMOIDOSCOPY;  Surgeon: Ronald Lobo, MD;  Location: [facility] ENDOSCOPY;  Service: Endoscopy;;  . Colectomy with colostomy creation/hartmann procedure  N/A 06/17/2015    Procedure: LAPAROSCOPY LOOP COLOSTOMY;  Surgeon: Johnathan Hausen, MD;  Location: [facility] ORS;  Service: General;  Laterality: N/A;   Social History:   reports that she quit smoking about 5 years ago. Her smoking use included Cigarettes. She smoked 1.00 pack per day. She has never used smokeless tobacco. She reports that she drinks about 1.2 oz of alcohol per week. She reports that she does not use illicit drugs.  Family History  Problem Relation Age of Onset  . Prostate cancer Father     Patient states that his father probably had prostate cancer    Medications:   Medication List       This list is accurate as of: 06/26/15  2:54 PM.  Always use your most recent med list.               ALPRAZolam 0.25 MG tablet  Commonly known as:  XANAX  Take 0.25 mg by mouth every 8 (eight) hours as needed for anxiety.     benazepril 40 MG tablet  Commonly known as:  LOTENSIN  Take 40 mg by mouth daily.     bisoprolol 10 MG tablet  Commonly known as:  ZEBETA  Take 10 mg by mouth daily.     cloNIDine 0.1 MG tablet  Commonly known as:  CATAPRES  Take 0.1 mg by mouth daily. Reported on 06/10/2015     imipramine 10 MG tablet  Commonly known as:  TOFRANIL  Take 30 mg by mouth at bedtime.     lansoprazole 30 MG capsule  Commonly known as:  PREVACID  Take 30 mg by mouth daily at 12 noon.     spironolactone 25 MG tablet  Commonly known as:  ALDACTONE  Take 1 tablet (25 mg total) by mouth 2 (two) times daily.     traMADol 50 MG tablet  Commonly known as:  ULTRAM  Take 50 mg by mouth every 8 (eight) hours as needed (pain.). for pain        Immunizations: Immunization History  Administered Date(s) Administered  . PPD Test 06/25/2015  . Tdap 08/05/2012     Physical Exam: Filed Vitals:   06/26/15 1447  BP: 157/95  Pulse: 111  Temp: 97.3 F (36.3 C)  TempSrc: Oral  Resp: 16  SpO2: 94%   General- elderly female, in no acute distress Head- normocephalic,  atraumatic Nose- no maxillary or frontal sinus tenderness, no nasal discharge Throat- moist mucus membrane  Eyes- PERRLA, EOMI, no pallor, no icterus, no discharge, normal conjunctiva, normal sclera Neck- no cervical lymphadenopathy Cardiovascular- normal s1,s2, no murmur, no leg edema Respiratory- bilateral clear to auscultation, no wheeze, no rhonchi, no crackles, no use of accessory muscles Abdomen- bowel sounds present, soft, non tender, colostomy bag site appears clean Musculoskeletal- able to move all 4 extremities, generalized weakness Neurological- alert and oriented to person only Skin- warm and dry Psychiatry- normal mood and affect    Labs reviewed: Basic Metabolic Panel:  Recent Labs  06/17/15 0523 06/18/15 0510 06/24/15 06/24/15 1216  NA 128* 128* 128* 128*  K 4.5 4.5  --  4.0  CL 93* 93*  --  92*  CO2 25 27  --  28  GLUCOSE 101* 161*  --  130*  BUN 10 7 10 10   CREATININE 0.82 0.66 0.7 0.66  CALCIUM 9.1 8.7*  --  8.9   Liver Function Tests:  Recent Labs  06/10/15 1131 06/11/15 0527 06/12/15 0455  AST 25 18 20   ALT 16 15 13*  ALKPHOS 68 60 59  BILITOT 0.7 0.4 0.3  PROT 6.9 6.2* 5.9*  ALBUMIN 3.6 3.2* 2.9*    Recent Labs  12/15/14 0915 06/10/15 1131  LIPASE 39 38   No results for input(s): AMMONIA in the last 8760 hours. CBC:  Recent Labs  12/15/14 0915 06/10/15 1131 06/10/15 2101  06/17/15 0523 06/18/15 0510 06/24/15 06/24/15 1216  WBC 8.9 9.8 10.7*  < > 13.3* 15.4* 11.2 11.2*  NEUTROABS 5.0 6.4 7.6  --   --   --   --   --   HGB 12.4 9.7* 9.1*  < > 10.5* 10.2*  --  9.1*  HCT 37.6 29.8* 29.0*  < > 33.0* 31.0*  --  28.3*  MCV 87.2 79.5 79.7  < > 80.1 77.9*  --  76.3*  PLT 386 458* 499*  < > 602* 536*  --  598*  < > = values in this interval not displayed. Cardiac Enzymes: No results for input(s): CKTOTAL, CKMB, CKMBINDEX, TROPONINI in the last 8760 hours. BNP: Invalid input(s): POCBNP CBG:  Recent Labs  06/17/15 0740  06/19/15 1131 06/19/15 1629  GLUCAP 97 129* 104*    Radiological Exams: Ct Chest W Contrast  06/13/2015  CLINICAL DATA:  Colonic mass.  Evaluate for metastases. EXAM: CT CHEST, ABDOMEN, AND PELVIS WITH CONTRAST TECHNIQUE: Multidetector CT imaging of the chest, abdomen and pelvis was performed following the standard protocol during bolus administration of intravenous contrast. CONTRAST:  100 mL Isovue-300 COMPARISON:  CT abdomen and pelvis 12/15/2014 FINDINGS: CT CHEST There is mild diffuse enlargement of the thyroid gland, incompletely visualized. No enlarged axillary, mediastinal, or hilar lymph nodes are identified. The heart is borderline enlarged. Three vessel coronary artery calcification and moderate thoracic aortic calcification are noted. There is no pericardial effusion. There are small bilateral pleural effusions which are new from the prior abdominal CT. Major airways are patent. Evaluation of the lung parenchyma is mildly limited by motion artifact. There is mild centrilobular emphysema. Subsegmental atelectasis is present in both lung bases. There is a new 8 x 5 mm nodule in the right middle lobe (series 4, image 39). Other small lung nodules were not previously imaged and measure 3 mm in the superior segment of the right lower lobe (series 4, image 22), 3 mm in the anterior right upper lobe (series 4, image 18), and 3 mm in the left upper lobe (series 4, image 11). No suspicious lytic or blastic osseous lesions are identified. There is a mild T9 superior endplate compression fracture which is favored to be chronic. CT ABDOMEN AND PELVIS A 9 mm hypodensity in the right hepatic lobe is unchanged. The gallbladder is surgically absent, and there is mild intrahepatic and extrahepatic biliary dilatation which is slightly less prominent than on the prior study. The spleen, adrenal glands, and pancreas are unremarkable. Low-density right greater than left renal lesions are unchanged and likely  represent cysts, with a coarse calcification associated with a cyst in the right interpolar region. A 7 mm mildly hyperdense lesion extending posteriorly from the right upper pole is unchanged.  Enhancing 3.0 x 1.8 cm lesion extending anteriorly from the lower pole of the left kidney is unchanged. A duodenal diverticulum is again noted, slightly less distended than on the prior study. Masslike wall thickening along the superior margin of the distal sigmoid colon has mildly enlarged from the prior CT, spanning approximately 7.1 cm in length and measuring approximately 4.4 cm in thickness on sagittal images (series 603, image 61). The sigmoid colon proximal to this is dilated to approximately 7 cm in diameter, containing gas and stool. There is a large amount of stool throughout the more proximal colon. Oral contrast is present in nondilated loops of small bowel as well as in the cecum. There is aortoiliac atherosclerotic calcification without evidence of aneurysm. No free fluid or enlarged lymph nodes are identified. The bladder is unremarkable. The uterus is absent. No suspicious lytic or blastic osseous lesions are identified. Screws are noted in the proximal left femur. IMPRESSION: 1. Enlargement of sigmoid colon mass consistent with malignancy. Dilatation of the more proximal sigmoid colon which may reflect partial obstruction. Large amount of stool throughout the colon. No small bowel dilatation. 2. No evidence of metastatic disease in the abdomen or pelvis. 3. Small lung nodules which are indeterminate, however an 8 mm right middle lobe nodule is new and suspicious for metastasis. 4. Unchanged left renal mass compatible with renal cell carcinoma. Electronically Signed   By: Logan Bores M.D.   On: 06/13/2015 22:55   Ct Abdomen Pelvis W Contrast  06/13/2015  CLINICAL DATA:  Colonic mass.  Evaluate for metastases. EXAM: CT CHEST, ABDOMEN, AND PELVIS WITH CONTRAST TECHNIQUE: Multidetector CT imaging of the  chest, abdomen and pelvis was performed following the standard protocol during bolus administration of intravenous contrast. CONTRAST:  100 mL Isovue-300 COMPARISON:  CT abdomen and pelvis 12/15/2014 FINDINGS: CT CHEST There is mild diffuse enlargement of the thyroid gland, incompletely visualized. No enlarged axillary, mediastinal, or hilar lymph nodes are identified. The heart is borderline enlarged. Three vessel coronary artery calcification and moderate thoracic aortic calcification are noted. There is no pericardial effusion. There are small bilateral pleural effusions which are new from the prior abdominal CT. Major airways are patent. Evaluation of the lung parenchyma is mildly limited by motion artifact. There is mild centrilobular emphysema. Subsegmental atelectasis is present in both lung bases. There is a new 8 x 5 mm nodule in the right middle lobe (series 4, image 39). Other small lung nodules were not previously imaged and measure 3 mm in the superior segment of the right lower lobe (series 4, image 22), 3 mm in the anterior right upper lobe (series 4, image 18), and 3 mm in the left upper lobe (series 4, image 11). No suspicious lytic or blastic osseous lesions are identified. There is a mild T9 superior endplate compression fracture which is favored to be chronic. CT ABDOMEN AND PELVIS A 9 mm hypodensity in the right hepatic lobe is unchanged. The gallbladder is surgically absent, and there is mild intrahepatic and extrahepatic biliary dilatation which is slightly less prominent than on the prior study. The spleen, adrenal glands, and pancreas are unremarkable. Low-density right greater than left renal lesions are unchanged and likely represent cysts, with a coarse calcification associated with a cyst in the right interpolar region. A 7 mm mildly hyperdense lesion extending posteriorly from the right upper pole is unchanged. Enhancing 3.0 x 1.8 cm lesion extending anteriorly from the lower pole of  the left kidney is unchanged. A duodenal diverticulum  is again noted, slightly less distended than on the prior study. Masslike wall thickening along the superior margin of the distal sigmoid colon has mildly enlarged from the prior CT, spanning approximately 7.1 cm in length and measuring approximately 4.4 cm in thickness on sagittal images (series 603, image 61). The sigmoid colon proximal to this is dilated to approximately 7 cm in diameter, containing gas and stool. There is a large amount of stool throughout the more proximal colon. Oral contrast is present in nondilated loops of small bowel as well as in the cecum. There is aortoiliac atherosclerotic calcification without evidence of aneurysm. No free fluid or enlarged lymph nodes are identified. The bladder is unremarkable. The uterus is absent. No suspicious lytic or blastic osseous lesions are identified. Screws are noted in the proximal left femur. IMPRESSION: 1. Enlargement of sigmoid colon mass consistent with malignancy. Dilatation of the more proximal sigmoid colon which may reflect partial obstruction. Large amount of stool throughout the colon. No small bowel dilatation. 2. No evidence of metastatic disease in the abdomen or pelvis. 3. Small lung nodules which are indeterminate, however an 8 mm right middle lobe nodule is new and suspicious for metastasis. 4. Unchanged left renal mass compatible with renal cell carcinoma. Electronically Signed   By: Logan Bores M.D.   On: 06/13/2015 22:55   Dg Chest Port 1 View  06/10/2015  CLINICAL DATA:  Generalized abdominal pain, shortness of breath EXAM: PORTABLE CHEST 1 VIEW COMPARISON:  08/25/2011 FINDINGS: Cardiomediastinal silhouette is stable. Mild elevation of the left hemidiaphragm again noted. Stable left basilar scarring. No infiltrate or pulmonary edema. IMPRESSION: No active disease.  Stable left basilar scarring. Electronically Signed   By: Lahoma Crocker M.D.   On: 06/10/2015 13:26     Assessment/Plan  Physical deconditioning Will have patient work with PT/OT as tolerated to regain strength and restore function.  Fall precautions are in place.  Adenocarcinoma of colon Will need  To make appointment with cancer centre  Colon obstruction With near obstruction of rectosigmoid colon. S/p open loop colostomy. Continue colostomy site care. Has f/u with surgery. Monitor stool output. Continue tramadol 50 mg q8h prn pain.   Protein calorie malnutrition Get dietary consult. Monitor po intake  Leukocytosis Monitor cbc and temp curve  Blood loss anemia Post op, monitor cbc  Thrombocytosis Likely reactive thrombocytosis. Monitor platelet count  Hyponatremia Hydration encouraged. Monitor bmp  Dementia No acute behavior changes reported. Continue supportive care. Monitor clinically  HTN Elevated BP reading, check bp q shift. Continue benazepril 40 mg daily, bisoprolol 10 mg daily, clonidine 0.1 mg daily and aldactone 25 mg bid. Check bmp  CHF Monitor clinically, continue ACEI, b blocker and aldactone for now  Chronic depression Continue imipramine and xanax, no changes made  gerd Stable symptom, continue prevacid and monitor   Goals of care: short term rehabilitation   Labs/tests ordered: cbc, cmp 06/27/15  Family/ staff Communication: reviewed care plan with patient and nursing supervisor    Blanchie Serve, MD Internal Medicine [place], [place] [address] Cell Phone (Monday-Friday 8 am - 5 pm): [phone] On Call: [phone] and follow prompts after 5 pm and on weekends Office Phone: [phone] Office Fax: [phone]

## 2015-06-26 NOTE — Telephone Encounter (Signed)
Oncology Nurse Navigator Documentation  Oncology Nurse Navigator Flowsheets 06/26/2015  Navigator Location [facility]  Navigator Encounter Type Introductory phone call  Abnormal Finding Date 06/13/2015  Confirmed Diagnosis Date 06/13/2015  Surgery Date 06/17/2015  Called daughter to discuss having her mother seen by oncologist. She expresses frustration that Natalie [name] was not seen in the hospital due to the difficulty it will be getting her to the office and fact that she does not know she has colon cancer. If she is informed of her cancer diagnosis, she will not deal with it well-will be very upsetting. Asking if there is an oral agent she can take instead of IV chemotherapy? Made her aware that there is oral Xeloda, but it also has side effects and would need to ensure the SNF is willing to administer the drug. Her mother is elderly with many other co-morbidities, which could make any chemotherapy difficult to tolerate. Will have Dr. Benay Spice review her records to give his thoughts on potential treatment and effectiveness. However, he would need to see her to prescribe, which her daughter is very understanding about. Daughter also plans to discuss the case with Dr. Marin Olp and his wife (friends of hers) to get his thoughts. Informed her we could refer her to Dr. Marin Olp in [facility] if she prefers. She will follow up on this and return call-provided direct # for navigator for her. Expresses displeasure at the transition to SNF yesterday--none of her ostomy supplies were sent with her and the facility staff called her to ask what her physical limitations were. Patient has not adjusted well thus far to the move and is agitated. Made daughter aware that when she is seen here, they can bring her through the hospital entrance and then come over to center and when we speak with her we can say we are trying to treat her "obstruction". Daughter seemed pleased with this approach. Informed her I will call back after  Dr. Benay Spice gives his preliminary input. Reports she sees Dr. Hassell Done on 07/11/15 for postop visit.

## 2015-06-27 ENCOUNTER — Telehealth: Payer: Self-pay | Admitting: *Deleted

## 2015-06-27 LAB — HEPATIC FUNCTION PANEL
ALT: 19 U/L (ref 7–35)
AST: 19 U/L (ref 13–35)
Alkaline Phosphatase: 74 U/L (ref 25–125)
BILIRUBIN, TOTAL: 0.3 mg/dL

## 2015-06-27 LAB — BASIC METABOLIC PANEL
BUN: 11 mg/dL (ref 4–21)
Creatinine: 0.6 mg/dL (ref 0.5–1.1)
Glucose: 80 mg/dL
POTASSIUM: 4.3 mmol/L (ref 3.4–5.3)
Sodium: 132 mmol/L — AB (ref 137–147)

## 2015-06-27 LAB — CBC AND DIFFERENTIAL
HEMATOCRIT: 30 % — AB (ref 36–46)
HEMOGLOBIN: 9.1 g/dL — AB (ref 12.0–16.0)
NEUTROS ABS: 8 /uL
PLATELETS: 605 10*3/uL — AB (ref 150–399)
WBC: 11.3 10*3/mL

## 2015-06-27 NOTE — Telephone Encounter (Signed)
Oncology Nurse Navigator Documentation  Oncology Nurse Navigator Flowsheets 06/27/2015  Navigator Location [facility]  Navigator Encounter Type Telephone  Abnormal Finding Date -  Confirmed Diagnosis Date -  Surgery Date -  Spoke with daughter after Dr. Benay Spice had opportunity to review her chart. Based on her current status and other medical issues, he would not offer chemotherapy at this time-IV or oral. Her cancer is not curable and treatment with the Xeloda only has 20-25% chance of shrinking the tumors, but has side effects that would be difficult for her. Feels that best supportive care is a reasonable approach at this time. Daughter will speak with her friend who is married to an oncologist in [facility] and call back with her decision. She has navigator's direct #.

## 2015-06-28 ENCOUNTER — Encounter: Payer: Self-pay | Admitting: Adult Health

## 2015-06-28 ENCOUNTER — Non-Acute Institutional Stay (SKILLED_NURSING_FACILITY): Payer: Medicare Other | Admitting: Adult Health

## 2015-06-28 DIAGNOSIS — K5901 Slow transit constipation: Secondary | ICD-10-CM

## 2015-06-28 DIAGNOSIS — D72829 Elevated white blood cell count, unspecified: Secondary | ICD-10-CM | POA: Diagnosis not present

## 2015-06-28 DIAGNOSIS — E871 Hypo-osmolality and hyponatremia: Secondary | ICD-10-CM

## 2015-06-28 DIAGNOSIS — E43 Unspecified severe protein-calorie malnutrition: Secondary | ICD-10-CM

## 2015-06-28 NOTE — Progress Notes (Signed)
Patient ID: Natalie [name], female   DOB: Jul 18, 1930, 82 y.o.   MRN: [id number]    DATE:  06/28/15  MRN:  [id number]  BIRTHDAY: [birth year]  Facility:  [facility] Home Location:  [facility] [place] Room Number: 1205-P  LEVEL OF CARE:  SNF [phone])  Contact Information    Name Relation Home Work Gholson Daughter [phone]  [phone]   High,Paula Daughter [phone]  [phone]       Code Status History    Date Active Date Inactive Code Status Order ID Comments User Context   06/10/2015  8:17 PM 06/17/2015  4:00 PM Full Code [id number]  Robbie Lis, MD Inpatient   11/30/2014  8:19 PM 12/04/2014  4:45 PM Full Code [id number]  Ivor Costa, MD ED   08/15/2011  4:31 AM 09/01/2011  5:49 PM Full Code [id number]  Asa Saunas, MD ED    Advance Directive Documentation        Most Recent Value   Type of Advance Directive  Out of facility DNR (pink MOST or yellow form)   Pre-existing out of facility DNR order (yellow form or pink MOST form)     "MOST" Form in Place?         Chief Complaint  Patient presents with  . Acute Visit    Constipation, protein-calorie malnutrition    HISTORY OF PRESENT ILLNESS:  This is an 82 year old female who has complained of abdominal pain. KUB result shows no bowel obstruction and moderate stool in colon compatible with constipation. Latest albumin was  noted to be low, 2.77, NA 132 and wbc 11.3.   She has been admitted to [facility] on 06/25/15 from [facility]. She has PMH of atrial fibrillation, dementia, hypertension and hypothyroidism. She was hospitalized due to melanotic stools. She was started on IV fluids. Her Coumadin was held and she was started on IV Protonix. She had colonoscopy on 06/13/15 showing near obstructing mass mass in rectosigmoid. Biopsies were taken and revealed adenocarcinoma. She had laparoscopy and open loop colostomy in left upper quadrant.  She has been admitted for a short-term  rehabilitation.  PAST MEDICAL HISTORY:  Past Medical History  Diagnosis Date  . Osteoporosis   . Hypertension   . Hypothyroidism   . Lower GI bleeding 07/21/11  . Atrial fibrillation ([place])   . COPD (chronic obstructive pulmonary disease) ([place])   . Pneumonia   . Shortness of breath 07/21/11    "anytime"  . Sinus headache 07/21/11    "all the time"  . Bladder infection, chronic   . Arthritis     "everywhere"  . Anxiety   . Chronic lower back pain   . Chronic abdominal pain   . Renal mass, left 2013  . PUD (peptic ulcer disease)   . Chronic cystitis   . Trigeminal neuralgia     /E-chart  . Chronic kidney disease   . Anemia   . Respiratory failure, extubated 08/15/11 08/21/2011  . Normal left ventricular systolic function by 2D [id number] 08/21/2011  . Physical deconditioning   . Colon adenocarcinoma ([place])   . Thrombocytosis ([facility])      CURRENT MEDICATIONS: Reviewed  Patient's Medications  New Prescriptions   No medications on file  Previous Medications   ALPRAZOLAM (XANAX) 0.25 MG TABLET    Take 0.25 mg by mouth every 8 (eight) hours as needed for anxiety.    BISOPROLOL (ZEBETA) 10 MG TABLET    Take 10 mg  by mouth daily.   CLONIDINE (CATAPRES) 0.1 MG TABLET    Take 0.1 mg by mouth daily. Reported on 06/10/2015   DOCUSATE SODIUM (COLACE) 100 MG CAPSULE    Take 100 mg by mouth 2 (two) times daily.   IMIPRAMINE (TOFRANIL) 10 MG TABLET    Take 30 mg by mouth at bedtime.   LISINOPRIL (PRINIVIL,ZESTRIL) 40 MG TABLET    Take 40 mg by mouth daily.   NITROFURANTOIN, MACROCRYSTAL-MONOHYDRATE, (MACROBID) 100 MG CAPSULE    Take 100 mg by mouth 2 (two) times daily. Take for 7 days   OMEPRAZOLE (PRILOSEC) 20 MG CAPSULE    Take 20 mg by mouth daily. Give at 12PM   POLYETHYLENE GLYCOL (MIRALAX / GLYCOLAX) PACKET    Take 17 g by mouth 2 (two) times daily as needed.   PROTEIN (PROCEL) POWD    Take 2 scoop by mouth 2 (two) times daily.   SACCHAROMYCES BOULARDII (FLORASTOR) 250 MG CAPSULE    Take  250 mg by mouth 2 (two) times daily. For 10 days.   SPIRONOLACTONE (ALDACTONE) 25 MG TABLET    Take 1 tablet (25 mg total) by mouth 2 (two) times daily.   TRAMADOL (ULTRAM) 50 MG TABLET    Take 50 mg by mouth every 4 (four) hours as needed (pain.). for pain  Modified Medications   No medications on file  Discontinued Medications   BENAZEPRIL (LOTENSIN) 40 MG TABLET    Take 40 mg by mouth daily.   LANSOPRAZOLE (PREVACID) 30 MG CAPSULE    Take 30 mg by mouth daily at 12 noon.     Allergies  Allergen Reactions  . Levofloxacin Other (See Comments)    REACTION: unspecified per MAR     REVIEW OF SYSTEMS:  GENERAL: no change in appetite, no fatigue, no weight changes, no fever, chills or weakness EYES: Denies change in vision, dry eyes, eye pain, itching or discharge EARS: Denies change in hearing, ringing in ears, or earache NOSE: Denies nasal congestion or epistaxis MOUTH and THROAT: Denies oral discomfort, gingival pain or bleeding, pain from teeth or hoarseness   RESPIRATORY: no cough, SOB, DOE, wheezing, hemoptysis CARDIAC: no chest pain, edema or palpitations GI: no abdominal pain, diarrhea, constipation, heart burn, nausea or vomiting GU: Denies dysuria, frequency, hematuria, incontinence, or discharge PSYCHIATRIC: Denies feeling of depression or anxiety. No report of hallucinations, insomnia, paranoia, or agitation   PHYSICAL EXAMINATION  GENERAL APPEARANCE: Well nourished. In no acute distress. Normal body habitus SKIN:  Skin is warm and dry.  HEAD: Normal in size and contour. No evidence of trauma EYES: Lids open and close normally. No blepharitis, entropion or ectropion. PERRL. Conjunctivae are clear and sclerae are white. Lenses are without opacity EARS: Pinnae are normal. Patient hears normal voice tunes of the examiner MOUTH and THROAT: Lips are without lesions. Oral mucosa is moist and without lesions. Tongue is normal in shape, size, and color and without  lesions NECK: supple, trachea midline, no neck masses, no thyroid tenderness, no thyromegaly LYMPHATICS: no LAN in the neck, no supraclavicular LAN RESPIRATORY: breathing is even & unlabored, BS CTAB CARDIAC: RRR, no murmur,no extra heart sounds, no edema GI: abdomen soft, normal BS, no masses, no tenderness, no hepatomegaly, no splenomegaly; +colostomy EXTREMITIES:  Able to move X 4 extremities PSYCHIATRIC: Alert and oriented X 3. Affect and behavior are appropriate  LABS/RADIOLOGY: Labs reviewed: Basic Metabolic Panel:  Recent Labs  06/17/15 0523 06/18/15 0510 06/24/15 06/24/15 1216 06/27/15  NA 128* 128*  128* 128* 132*  K 4.5 4.5  --  4.0 4.3  CL 93* 93*  --  92*  --   CO2 25 27  --  28  --   GLUCOSE 101* 161*  --  130*  --   BUN 10 7 10 10 11   CREATININE 0.82 0.66 0.7 0.66 0.6  CALCIUM 9.1 8.7*  --  8.9  --    Liver Function Tests:  Recent Labs  06/10/15 1131 06/11/15 0527 06/12/15 0455 06/27/15  AST 25 18 20 19   ALT 16 15 13* 19  ALKPHOS 68 60 59 74  BILITOT 0.7 0.4 0.3  --   PROT 6.9 6.2* 5.9*  --   ALBUMIN 3.6 3.2* 2.9*  --     Recent Labs  12/15/14 0915 06/10/15 1131  LIPASE 39 38    CBC:  Recent Labs  06/10/15 1131 06/10/15 2101  06/17/15 0523 06/18/15 0510 06/24/15 06/24/15 1216 06/27/15  WBC 9.8 10.7*  < > 13.3* 15.4* 11.2 11.2* 11.3  NEUTROABS 6.4 7.6  --   --   --   --   --  8  HGB 9.7* 9.1*  < > 10.5* 10.2*  --  9.1* 9.1*  HCT 29.8* 29.0*  < > 33.0* 31.0*  --  28.3* 30*  MCV 79.5 79.7  < > 80.1 77.9*  --  76.3*  --   PLT 458* 499*  < > 602* 536*  --  598* 605*  < > = values in this interval not displayed.  CBG:  Recent Labs  06/17/15 0740 06/19/15 1131 06/19/15 1629  GLUCAP 97 129* 104*      Ct Chest W Contrast  06/13/2015  CLINICAL DATA:  Colonic mass.  Evaluate for metastases. EXAM: CT CHEST, ABDOMEN, AND PELVIS WITH CONTRAST TECHNIQUE: Multidetector CT imaging of the chest, abdomen and pelvis was performed following the  standard protocol during bolus administration of intravenous contrast. CONTRAST:  100 mL Isovue-300 COMPARISON:  CT abdomen and pelvis 12/15/2014 FINDINGS: CT CHEST There is mild diffuse enlargement of the thyroid gland, incompletely visualized. No enlarged axillary, mediastinal, or hilar lymph nodes are identified. The heart is borderline enlarged. Three vessel coronary artery calcification and moderate thoracic aortic calcification are noted. There is no pericardial effusion. There are small bilateral pleural effusions which are new from the prior abdominal CT. Major airways are patent. Evaluation of the lung parenchyma is mildly limited by motion artifact. There is mild centrilobular emphysema. Subsegmental atelectasis is present in both lung bases. There is a new 8 x 5 mm nodule in the right middle lobe (series 4, image 39). Other small lung nodules were not previously imaged and measure 3 mm in the superior segment of the right lower lobe (series 4, image 22), 3 mm in the anterior right upper lobe (series 4, image 18), and 3 mm in the left upper lobe (series 4, image 11). No suspicious lytic or blastic osseous lesions are identified. There is a mild T9 superior endplate compression fracture which is favored to be chronic. CT ABDOMEN AND PELVIS A 9 mm hypodensity in the right hepatic lobe is unchanged. The gallbladder is surgically absent, and there is mild intrahepatic and extrahepatic biliary dilatation which is slightly less prominent than on the prior study. The spleen, adrenal glands, and pancreas are unremarkable. Low-density right greater than left renal lesions are unchanged and likely represent cysts, with a coarse calcification associated with a cyst in the right interpolar region. A 7 mm mildly  hyperdense lesion extending posteriorly from the right upper pole is unchanged. Enhancing 3.0 x 1.8 cm lesion extending anteriorly from the lower pole of the left kidney is unchanged. A duodenal diverticulum is  again noted, slightly less distended than on the prior study. Masslike wall thickening along the superior margin of the distal sigmoid colon has mildly enlarged from the prior CT, spanning approximately 7.1 cm in length and measuring approximately 4.4 cm in thickness on sagittal images (series 603, image 61). The sigmoid colon proximal to this is dilated to approximately 7 cm in diameter, containing gas and stool. There is a large amount of stool throughout the more proximal colon. Oral contrast is present in nondilated loops of small bowel as well as in the cecum. There is aortoiliac atherosclerotic calcification without evidence of aneurysm. No free fluid or enlarged lymph nodes are identified. The bladder is unremarkable. The uterus is absent. No suspicious lytic or blastic osseous lesions are identified. Screws are noted in the proximal left femur. IMPRESSION: 1. Enlargement of sigmoid colon mass consistent with malignancy. Dilatation of the more proximal sigmoid colon which may reflect partial obstruction. Large amount of stool throughout the colon. No small bowel dilatation. 2. No evidence of metastatic disease in the abdomen or pelvis. 3. Small lung nodules which are indeterminate, however an 8 mm right middle lobe nodule is new and suspicious for metastasis. 4. Unchanged left renal mass compatible with renal cell carcinoma. Electronically Signed   By: Logan Bores M.D.   On: 06/13/2015 22:55   Ct Abdomen Pelvis W Contrast  06/13/2015  CLINICAL DATA:  Colonic mass.  Evaluate for metastases. EXAM: CT CHEST, ABDOMEN, AND PELVIS WITH CONTRAST TECHNIQUE: Multidetector CT imaging of the chest, abdomen and pelvis was performed following the standard protocol during bolus administration of intravenous contrast. CONTRAST:  100 mL Isovue-300 COMPARISON:  CT abdomen and pelvis 12/15/2014 FINDINGS: CT CHEST There is mild diffuse enlargement of the thyroid gland, incompletely visualized. No enlarged axillary,  mediastinal, or hilar lymph nodes are identified. The heart is borderline enlarged. Three vessel coronary artery calcification and moderate thoracic aortic calcification are noted. There is no pericardial effusion. There are small bilateral pleural effusions which are new from the prior abdominal CT. Major airways are patent. Evaluation of the lung parenchyma is mildly limited by motion artifact. There is mild centrilobular emphysema. Subsegmental atelectasis is present in both lung bases. There is a new 8 x 5 mm nodule in the right middle lobe (series 4, image 39). Other small lung nodules were not previously imaged and measure 3 mm in the superior segment of the right lower lobe (series 4, image 22), 3 mm in the anterior right upper lobe (series 4, image 18), and 3 mm in the left upper lobe (series 4, image 11). No suspicious lytic or blastic osseous lesions are identified. There is a mild T9 superior endplate compression fracture which is favored to be chronic. CT ABDOMEN AND PELVIS A 9 mm hypodensity in the right hepatic lobe is unchanged. The gallbladder is surgically absent, and there is mild intrahepatic and extrahepatic biliary dilatation which is slightly less prominent than on the prior study. The spleen, adrenal glands, and pancreas are unremarkable. Low-density right greater than left renal lesions are unchanged and likely represent cysts, with a coarse calcification associated with a cyst in the right interpolar region. A 7 mm mildly hyperdense lesion extending posteriorly from the right upper pole is unchanged. Enhancing 3.0 x 1.8 cm lesion extending anteriorly from the  lower pole of the left kidney is unchanged. A duodenal diverticulum is again noted, slightly less distended than on the prior study. Masslike wall thickening along the superior margin of the distal sigmoid colon has mildly enlarged from the prior CT, spanning approximately 7.1 cm in length and measuring approximately 4.4 cm in thickness  on sagittal images (series 603, image 61). The sigmoid colon proximal to this is dilated to approximately 7 cm in diameter, containing gas and stool. There is a large amount of stool throughout the more proximal colon. Oral contrast is present in nondilated loops of small bowel as well as in the cecum. There is aortoiliac atherosclerotic calcification without evidence of aneurysm. No free fluid or enlarged lymph nodes are identified. The bladder is unremarkable. The uterus is absent. No suspicious lytic or blastic osseous lesions are identified. Screws are noted in the proximal left femur. IMPRESSION: 1. Enlargement of sigmoid colon mass consistent with malignancy. Dilatation of the more proximal sigmoid colon which may reflect partial obstruction. Large amount of stool throughout the colon. No small bowel dilatation. 2. No evidence of metastatic disease in the abdomen or pelvis. 3. Small lung nodules which are indeterminate, however an 8 mm right middle lobe nodule is new and suspicious for metastasis. 4. Unchanged left renal mass compatible with renal cell carcinoma. Electronically Signed   By: Logan Bores M.D.   On: 06/13/2015 22:55   Dg Chest Port 1 View  06/10/2015  CLINICAL DATA:  Generalized abdominal pain, shortness of breath EXAM: PORTABLE CHEST 1 VIEW COMPARISON:  08/25/2011 FINDINGS: Cardiomediastinal silhouette is stable. Mild elevation of the left hemidiaphragm again noted. Stable left basilar scarring. No infiltrate or pulmonary edema. IMPRESSION: No active disease.  Stable left basilar scarring. Electronically Signed   By: Lahoma Crocker M.D.   On: 06/10/2015 13:26    ASSESSMENT/PLAN:  Constipation - start Colace 100 mg by mouth twice a day and MiraLAX 17 g by mouth twice a day when necessary  Protein-calorie malnutrition, severe - albumin 2.77; start Procel 2 scoops by mouth twice a day; RD consult  Hyponatremia - NA 132; check BMP in 1 week  Leukocytosis - wbc 11.3; check CBC in 1  week      [facility], NP [place]

## 2015-07-02 ENCOUNTER — Telehealth: Payer: Self-pay | Admitting: *Deleted

## 2015-07-02 ENCOUNTER — Non-Acute Institutional Stay (SKILLED_NURSING_FACILITY): Payer: Medicare Other | Admitting: Adult Health

## 2015-07-02 ENCOUNTER — Encounter: Payer: Self-pay | Admitting: Adult Health

## 2015-07-02 DIAGNOSIS — N390 Urinary tract infection, site not specified: Secondary | ICD-10-CM

## 2015-07-02 NOTE — Progress Notes (Signed)
Patient ID: Natalie [name], female   DOB: [birth year], 82 y.o.   MRN: [id number]    DATE:  07/02/15  MRN:  [id number]  BIRTHDAY: April 23, 1930  Facility:  [facility] Location:  [place] Room Number: 1205-P  LEVEL OF CARE:  SNF [phone])  Contact Information    Name Relation Home Work [place] Daughter [phone]  [phone]   [place] Daughter [phone]  [phone]       Code Status History    Date Active Date Inactive Code Status Order ID Comments User Context   06/10/2015  8:17 PM 06/17/2015  4:00 PM Full Code [id number]  Robbie Lis, MD Inpatient   11/30/2014  8:19 PM 12/04/2014  4:45 PM Full Code [id number]  Ivor Costa, MD ED   08/15/2011  4:31 AM 09/01/2011  5:49 PM Full Code [id number]  Asa Saunas, MD ED    Advance Directive Documentation        Most Recent Value   Type of Advance Directive  Out of facility DNR (pink MOST or yellow form)   Pre-existing out of facility DNR order (yellow form or pink MOST form)     "MOST" Form in Place?         Chief Complaint  Patient presents with  . Acute Visit    UTI    HISTORY OF PRESENT ILLNESS:  This is an 82 year old female who has urine culture showing >100,000 CFU/ml Entericoccus FAECALIS. No fever nor hematuria.  Latest wbc is elevated, 11.3.  She has been admitted to [facility] on 06/25/15 from [facility]. She has PMH of atrial fibrillation, dementia, hypertension and hypothyroidism. She was hospitalized due to melanotic stools. She was started on IV fluids. Her Coumadin was held and she was started on IV Protonix. She had colonoscopy on 06/13/15 showing near obstructing mass mass in rectosigmoid. Biopsies were taken and revealed adenocarcinoma. She had laparoscopy and open loop colostomy in left upper quadrant.  She has been admitted for a short-term rehabilitation.  PAST MEDICAL HISTORY:  Past Medical History  Diagnosis Date  . Osteoporosis   . Hypertension    . Hypothyroidism   . Lower GI bleeding 07/21/11  . Atrial fibrillation ([place])   . COPD (chronic obstructive pulmonary disease) ([place])   . Pneumonia   . Shortness of breath 07/21/11    "anytime"  . Sinus headache 07/21/11    "all the time"  . Bladder infection, chronic   . Arthritis     "everywhere"  . Anxiety   . Chronic lower back pain   . Chronic abdominal pain   . Renal mass, left 2013  . PUD (peptic ulcer disease)   . Chronic cystitis   . Trigeminal neuralgia     /E-chart  . Chronic kidney disease   . Anemia   . Respiratory failure, extubated 08/15/11 08/21/2011  . Normal left ventricular systolic function by 2D [id number] 08/21/2011  . Physical deconditioning   . Colon adenocarcinoma ([place])   . Thrombocytosis ([facility])      CURRENT MEDICATIONS: Reviewed  Patient's Medications  New Prescriptions   No medications on file  Previous Medications   ALPRAZOLAM (XANAX) 0.25 MG TABLET    Take 0.25 mg by mouth every 8 (eight) hours as needed for anxiety.    BISOPROLOL (ZEBETA) 10 MG TABLET    Take 10 mg by mouth daily.   CLONIDINE (CATAPRES) 0.1 MG TABLET    Take 0.1 mg by mouth  daily. Reported on 06/10/2015   DOCUSATE SODIUM (COLACE) 100 MG CAPSULE    Take 100 mg by mouth 2 (two) times daily.   IMIPRAMINE (TOFRANIL) 10 MG TABLET    Take 30 mg by mouth at bedtime.   LISINOPRIL (PRINIVIL,ZESTRIL) 40 MG TABLET    Take 40 mg by mouth daily.   NITROFURANTOIN, MACROCRYSTAL-MONOHYDRATE, (MACROBID) 100 MG CAPSULE    Take 100 mg by mouth 2 (two) times daily. Take for 7 days   OMEPRAZOLE (PRILOSEC) 20 MG CAPSULE    Take 20 mg by mouth daily. Give at 12PM   POLYETHYLENE GLYCOL (MIRALAX / GLYCOLAX) PACKET    Take 17 g by mouth 2 (two) times daily as needed.   PROTEIN (PROCEL) POWD    Take 2 scoop by mouth 2 (two) times daily.   SACCHAROMYCES BOULARDII (FLORASTOR) 250 MG CAPSULE    Take 250 mg by mouth 2 (two) times daily. For 10 days.   SPIRONOLACTONE (ALDACTONE) 25 MG TABLET    Take 1 tablet (25 mg  total) by mouth 2 (two) times daily.   TRAMADOL (ULTRAM) 50 MG TABLET    Take 50 mg by mouth every 4 (four) hours as needed (pain.). for pain  Modified Medications   No medications on file  Discontinued Medications   No medications on file     Allergies  Allergen Reactions  . Levofloxacin Other (See Comments)    REACTION: unspecified per MAR     REVIEW OF SYSTEMS:  GENERAL: no change in appetite, no fatigue, no weight changes, no fever, chills or weakness EYES: Denies change in vision, dry eyes, eye pain, itching or discharge EARS: Denies change in hearing, ringing in ears, or earache NOSE: Denies nasal congestion or epistaxis MOUTH and THROAT: Denies oral discomfort, gingival pain or bleeding, pain from teeth or hoarseness   RESPIRATORY: no cough, SOB, DOE, wheezing, hemoptysis CARDIAC: no chest pain, edema or palpitations GI: no abdominal pain, diarrhea, constipation, heart burn, nausea or vomiting GU: Denies dysuria, frequency, hematuria, incontinence, or discharge PSYCHIATRIC: Denies feeling of depression or anxiety. No report of hallucinations, insomnia, paranoia, or agitation   PHYSICAL EXAMINATION  GENERAL APPEARANCE: Well nourished. In no acute distress. Normal body habitus SKIN:  Skin is warm and dry.  HEAD: Normal in size and contour. No evidence of trauma EYES: Lids open and close normally. No blepharitis, entropion or ectropion. PERRL. Conjunctivae are clear and sclerae are white. Lenses are without opacity EARS: Pinnae are normal. Patient hears normal voice tunes of the examiner MOUTH and THROAT: Lips are without lesions. Oral mucosa is moist and without lesions. Tongue is normal in shape, size, and color and without lesions NECK: supple, trachea midline, no neck masses, no thyroid tenderness, no thyromegaly LYMPHATICS: no LAN in the neck, no supraclavicular LAN RESPIRATORY: breathing is even & unlabored, BS CTAB CARDIAC: RRR, no murmur,no extra heart sounds, no  edema GI: abdomen soft, normal BS, no masses, no tenderness, no hepatomegaly, no splenomegaly; +colostomy EXTREMITIES:  Able to move X 4 extremities PSYCHIATRIC: Alert and oriented X 3. Affect and behavior are appropriate  LABS/RADIOLOGY: Labs reviewed: Basic Metabolic Panel:  Recent Labs  06/17/15 0523 06/18/15 0510 06/24/15 06/24/15 1216 06/27/15  NA 128* 128* 128* 128* 132*  K 4.5 4.5  --  4.0 4.3  CL 93* 93*  --  92*  --   CO2 25 27  --  28  --   GLUCOSE 101* 161*  --  130*  --   BUN  10 7 10 10 11   CREATININE 0.82 0.66 0.7 0.66 0.6  CALCIUM 9.1 8.7*  --  8.9  --    Liver Function Tests:  Recent Labs  06/10/15 1131 06/11/15 0527 06/12/15 0455 06/27/15  AST 25 18 20 19   ALT 16 15 13* 19  ALKPHOS 68 60 59 74  BILITOT 0.7 0.4 0.3  --   PROT 6.9 6.2* 5.9*  --   ALBUMIN 3.6 3.2* 2.9*  --     Recent Labs  12/15/14 0915 06/10/15 1131  LIPASE 39 38    CBC:  Recent Labs  06/10/15 1131 06/10/15 2101  06/17/15 0523 06/18/15 0510 06/24/15 06/24/15 1216 06/27/15  WBC 9.8 10.7*  < > 13.3* 15.4* 11.2 11.2* 11.3  NEUTROABS 6.4 7.6  --   --   --   --   --  8  HGB 9.7* 9.1*  < > 10.5* 10.2*  --  9.1* 9.1*  HCT 29.8* 29.0*  < > 33.0* 31.0*  --  28.3* 30*  MCV 79.5 79.7  < > 80.1 77.9*  --  76.3*  --   PLT 458* 499*  < > 602* 536*  --  598* 605*  < > = values in this interval not displayed.  CBG:  Recent Labs  06/17/15 0740 06/19/15 1131 06/19/15 1629  GLUCAP 97 129* 104*      Ct Chest W Contrast  06/13/2015  CLINICAL DATA:  Colonic mass.  Evaluate for metastases. EXAM: CT CHEST, ABDOMEN, AND PELVIS WITH CONTRAST TECHNIQUE: Multidetector CT imaging of the chest, abdomen and pelvis was performed following the standard protocol during bolus administration of intravenous contrast. CONTRAST:  100 mL Isovue-300 COMPARISON:  CT abdomen and pelvis 12/15/2014 FINDINGS: CT CHEST There is mild diffuse enlargement of the thyroid gland, incompletely visualized. No  enlarged axillary, mediastinal, or hilar lymph nodes are identified. The heart is borderline enlarged. Three vessel coronary artery calcification and moderate thoracic aortic calcification are noted. There is no pericardial effusion. There are small bilateral pleural effusions which are new from the prior abdominal CT. Major airways are patent. Evaluation of the lung parenchyma is mildly limited by motion artifact. There is mild centrilobular emphysema. Subsegmental atelectasis is present in both lung bases. There is a new 8 x 5 mm nodule in the right middle lobe (series 4, image 39). Other small lung nodules were not previously imaged and measure 3 mm in the superior segment of the right lower lobe (series 4, image 22), 3 mm in the anterior right upper lobe (series 4, image 18), and 3 mm in the left upper lobe (series 4, image 11). No suspicious lytic or blastic osseous lesions are identified. There is a mild T9 superior endplate compression fracture which is favored to be chronic. CT ABDOMEN AND PELVIS A 9 mm hypodensity in the right hepatic lobe is unchanged. The gallbladder is surgically absent, and there is mild intrahepatic and extrahepatic biliary dilatation which is slightly less prominent than on the prior study. The spleen, adrenal glands, and pancreas are unremarkable. Low-density right greater than left renal lesions are unchanged and likely represent cysts, with a coarse calcification associated with a cyst in the right interpolar region. A 7 mm mildly hyperdense lesion extending posteriorly from the right upper pole is unchanged. Enhancing 3.0 x 1.8 cm lesion extending anteriorly from the lower pole of the left kidney is unchanged. A duodenal diverticulum is again noted, slightly less distended than on the prior study. Masslike wall thickening along  the superior margin of the distal sigmoid colon has mildly enlarged from the prior CT, spanning approximately 7.1 cm in length and measuring approximately  4.4 cm in thickness on sagittal images (series 603, image 61). The sigmoid colon proximal to this is dilated to approximately 7 cm in diameter, containing gas and stool. There is a large amount of stool throughout the more proximal colon. Oral contrast is present in nondilated loops of small bowel as well as in the cecum. There is aortoiliac atherosclerotic calcification without evidence of aneurysm. No free fluid or enlarged lymph nodes are identified. The bladder is unremarkable. The uterus is absent. No suspicious lytic or blastic osseous lesions are identified. Screws are noted in the proximal left femur. IMPRESSION: 1. Enlargement of sigmoid colon mass consistent with malignancy. Dilatation of the more proximal sigmoid colon which may reflect partial obstruction. Large amount of stool throughout the colon. No small bowel dilatation. 2. No evidence of metastatic disease in the abdomen or pelvis. 3. Small lung nodules which are indeterminate, however an 8 mm right middle lobe nodule is new and suspicious for metastasis. 4. Unchanged left renal mass compatible with renal cell carcinoma. Electronically Signed   By: Logan Bores M.D.   On: 06/13/2015 22:55   Ct Abdomen Pelvis W Contrast  06/13/2015  CLINICAL DATA:  Colonic mass.  Evaluate for metastases. EXAM: CT CHEST, ABDOMEN, AND PELVIS WITH CONTRAST TECHNIQUE: Multidetector CT imaging of the chest, abdomen and pelvis was performed following the standard protocol during bolus administration of intravenous contrast. CONTRAST:  100 mL Isovue-300 COMPARISON:  CT abdomen and pelvis 12/15/2014 FINDINGS: CT CHEST There is mild diffuse enlargement of the thyroid gland, incompletely visualized. No enlarged axillary, mediastinal, or hilar lymph nodes are identified. The heart is borderline enlarged. Three vessel coronary artery calcification and moderate thoracic aortic calcification are noted. There is no pericardial effusion. There are small bilateral pleural  effusions which are new from the prior abdominal CT. Major airways are patent. Evaluation of the lung parenchyma is mildly limited by motion artifact. There is mild centrilobular emphysema. Subsegmental atelectasis is present in both lung bases. There is a new 8 x 5 mm nodule in the right middle lobe (series 4, image 39). Other small lung nodules were not previously imaged and measure 3 mm in the superior segment of the right lower lobe (series 4, image 22), 3 mm in the anterior right upper lobe (series 4, image 18), and 3 mm in the left upper lobe (series 4, image 11). No suspicious lytic or blastic osseous lesions are identified. There is a mild T9 superior endplate compression fracture which is favored to be chronic. CT ABDOMEN AND PELVIS A 9 mm hypodensity in the right hepatic lobe is unchanged. The gallbladder is surgically absent, and there is mild intrahepatic and extrahepatic biliary dilatation which is slightly less prominent than on the prior study. The spleen, adrenal glands, and pancreas are unremarkable. Low-density right greater than left renal lesions are unchanged and likely represent cysts, with a coarse calcification associated with a cyst in the right interpolar region. A 7 mm mildly hyperdense lesion extending posteriorly from the right upper pole is unchanged. Enhancing 3.0 x 1.8 cm lesion extending anteriorly from the lower pole of the left kidney is unchanged. A duodenal diverticulum is again noted, slightly less distended than on the prior study. Masslike wall thickening along the superior margin of the distal sigmoid colon has mildly enlarged from the prior CT, spanning approximately 7.1 cm in length  and measuring approximately 4.4 cm in thickness on sagittal images (series 603, image 61). The sigmoid colon proximal to this is dilated to approximately 7 cm in diameter, containing gas and stool. There is a large amount of stool throughout the more proximal colon. Oral contrast is present in  nondilated loops of small bowel as well as in the cecum. There is aortoiliac atherosclerotic calcification without evidence of aneurysm. No free fluid or enlarged lymph nodes are identified. The bladder is unremarkable. The uterus is absent. No suspicious lytic or blastic osseous lesions are identified. Screws are noted in the proximal left femur. IMPRESSION: 1. Enlargement of sigmoid colon mass consistent with malignancy. Dilatation of the more proximal sigmoid colon which may reflect partial obstruction. Large amount of stool throughout the colon. No small bowel dilatation. 2. No evidence of metastatic disease in the abdomen or pelvis. 3. Small lung nodules which are indeterminate, however an 8 mm right middle lobe nodule is new and suspicious for metastasis. 4. Unchanged left renal mass compatible with renal cell carcinoma. Electronically Signed   By: Logan Bores M.D.   On: 06/13/2015 22:55   Dg Chest Port 1 View  06/10/2015  CLINICAL DATA:  Generalized abdominal pain, shortness of breath EXAM: PORTABLE CHEST 1 VIEW COMPARISON:  08/25/2011 FINDINGS: Cardiomediastinal silhouette is stable. Mild elevation of the left hemidiaphragm again noted. Stable left basilar scarring. No infiltrate or pulmonary edema. IMPRESSION: No active disease.  Stable left basilar scarring. Electronically Signed   By: Lahoma Crocker M.D.   On: 06/10/2015 13:26    ASSESSMENT/PLAN:  UTI - start Macrobid 100 mg 1 PO BID X 7 days and Florastor 250 mg 1 capsule PO BID X 10 days    [facility], NP [employer] [phone]

## 2015-07-02 NOTE — Telephone Encounter (Signed)
  Oncology Nurse Navigator Documentation  Navigator Location: [facility] (07/02/15 1605) Navigator Encounter Type: Telephone (07/02/15 1605) Telephone: Outgoing Call (07/02/15 [id number])  Called daughter to f/u on status of her decision on her mom's care. She reports her brother is coming in to town later in week and they will all sit down and discuss options. Afterwards, she will call the friend she has married to an oncologist. Had bad experience at facility with reporting she has diarrhea, but they later realized her bag was allowed to get too full and that is why stool had spilled. She appreciates navigator checking in on her and will will update later in the week.

## 2015-07-05 LAB — BASIC METABOLIC PANEL
BUN: 11 mg/dL (ref 4–21)
Creatinine: 0.5 mg/dL (ref 0.5–1.1)
GLUCOSE: 100 mg/dL
Potassium: 3.7 mmol/L (ref 3.4–5.3)
Sodium: 135 mmol/L — AB (ref 137–147)

## 2015-07-05 LAB — CBC AND DIFFERENTIAL
HEMATOCRIT: 30 % — AB (ref 36–46)
Hemoglobin: 9.2 g/dL — AB (ref 12.0–16.0)
NEUTROS ABS: 16 /uL
PLATELETS: 547 10*3/uL — AB (ref 150–399)
WBC: 19.9 10^3/mL

## 2015-07-08 ENCOUNTER — Telehealth: Payer: Self-pay | Admitting: Oncology

## 2015-07-08 ENCOUNTER — Telehealth: Payer: Self-pay | Admitting: *Deleted

## 2015-07-08 NOTE — Telephone Encounter (Signed)
Janett Billow from [facility] called to schedule a f/u appointment with dr. Benay Spice.

## 2015-07-08 NOTE — Telephone Encounter (Signed)
  Oncology Nurse Navigator Documentation  Navigator Location: [facility] (07/08/15 1049) Navigator Encounter Type: Telephone (07/08/15 1049) Telephone: Outgoing Call (07/08/15 1049)  Called daughter after receiving message from [place] at [facility] to call them to schedule an appointment with Dr. Benay Spice. Daughter was upset that facility had called for this appointment. She will call the facility today and let Janett Billow know that they have not yet decided upon patient being seen by medical oncology at this time. This RN called facility and left VM for Janett Billow to please reach out to daughter, [name] [facility]) to discuss appointment.

## 2015-07-09 ENCOUNTER — Encounter: Payer: Self-pay | Admitting: Adult Health

## 2015-07-09 ENCOUNTER — Non-Acute Institutional Stay (SKILLED_NURSING_FACILITY): Payer: Medicare Other | Admitting: Adult Health

## 2015-07-09 DIAGNOSIS — I10 Essential (primary) hypertension: Secondary | ICD-10-CM

## 2015-07-09 DIAGNOSIS — C187 Malignant neoplasm of sigmoid colon: Secondary | ICD-10-CM

## 2015-07-09 DIAGNOSIS — D72829 Elevated white blood cell count, unspecified: Secondary | ICD-10-CM | POA: Diagnosis not present

## 2015-07-09 LAB — CBC AND DIFFERENTIAL
HCT: 27 % — AB (ref 36–46)
HEMOGLOBIN: 8.2 g/dL — AB (ref 12.0–16.0)
NEUTROS ABS: 17 /uL
PLATELETS: 501 10*3/uL — AB (ref 150–399)
WBC: 21.6 10*3/mL

## 2015-07-09 NOTE — Progress Notes (Signed)
Patient ID: Natalie [name], female   DOB: 1930/11/12, 82 y.o.   MRN: [id number]    DATE:  07/09/15  MRN:  [id number]  BIRTHDAY: 08/16/1930  Facility:  [facility] Home Location:  [place] Room Number: 1205-P  LEVEL OF CARE:  SNF [phone])  Contact Information    Name Relation Home Work [name] Daughter [phone]  [phone]   [place] Daughter [phone]  [phone]       Code Status History    Date Active Date Inactive Code Status Order ID Comments User Context   06/10/2015  8:17 PM 06/17/2015  4:00 PM Full Code [id number]  Natalie Lis, MD Inpatient   11/30/2014  8:19 PM 12/04/2014  4:45 PM Full Code [id number]  Ivor Costa, MD ED   08/15/2011  4:31 AM 09/01/2011  5:49 PM Full Code [id number]  Asa Saunas, MD ED    Advance Directive Documentation        Most Recent Value   Type of Advance Directive  Out of facility DNR (pink MOST or yellow form)   Pre-existing out of facility DNR order (yellow form or pink MOST form)     "MOST" Form in Place?         Chief Complaint  Patient presents with  . Acute Visit    Leukocytosis and Hypertension    HISTORY OF PRESENT ILLNESS:  This is an 82 year old female who was noted to have wbc 21.6 ( 07/05/15 wbc 19.9, 06/26/16 wbc 11.3) .  Noted that her colostomy pouch has yellowish liquid and clear mucoid from her stoma. Right lower quadrant is tender to the touch. Daughter reported that patient has an appointment with surgeon on 07/11/15. BP was noted to be elevate @ 180/120. No complaints of dizziness nor headache. She is currently taking Bisoprolol, Clonidine and Spironolactone. BPs - 160/95, 142/99, 136/87, 135/87 and 122/72. She just came back from a dental appointment and had teeth extracted. She was recently started on Macrobid for UTI.  She has been admitted to [facility] on 06/25/15 from [facility]. She has PMH of atrial fibrillation, dementia, hypertension and hypothyroidism. She  was hospitalized due to melanotic stools. She was started on IV fluids. Her Coumadin was held and she was started on IV Protonix. She had colonoscopy on 06/13/15 showing near obstructing mass mass in rectosigmoid. Biopsies were taken and revealed adenocarcinoma. She had laparoscopy and open loop colostomy in left upper quadrant.  She has been admitted for a short-term rehabilitation.  PAST MEDICAL HISTORY:  Past Medical History  Diagnosis Date  . Osteoporosis   . Hypertension   . Hypothyroidism   . Lower GI bleeding 07/21/11  . Atrial fibrillation ([place])   . COPD (chronic obstructive pulmonary disease) ([place])   . Pneumonia   . Shortness of breath 07/21/11    "anytime"  . Sinus headache 07/21/11    "all the time"  . Bladder infection, chronic   . Arthritis     "everywhere"  . Anxiety   . Chronic lower back pain   . Chronic abdominal pain   . Renal mass, left 2013  . PUD (peptic ulcer disease)   . Chronic cystitis   . Trigeminal neuralgia     /E-chart  . Chronic kidney disease   . Anemia   . Respiratory failure, extubated 08/15/11 08/21/2011  . Normal left ventricular systolic function by 2D [id number] 08/21/2011  . Physical deconditioning   . Colon adenocarcinoma ([place])   .  Thrombocytosis ([facility])      CURRENT MEDICATIONS: Reviewed  Patient's Medications  New Prescriptions   No medications on file  Previous Medications   ALPRAZOLAM (XANAX) 0.25 MG TABLET    Take 0.25 mg by mouth every 8 (eight) hours as needed for anxiety.    AMOXICILLIN (AMOXIL) 500 MG TABLET    Take 500 mg by mouth every 8 (eight) hours. Give 2 tablets stat and then 1 tablet q8h until gone   BISOPROLOL (ZEBETA) 10 MG TABLET    Take 10 mg by mouth daily.   CLONIDINE (CATAPRES) 0.1 MG TABLET    Take 0.1 mg by mouth daily. Reported on 06/10/2015   DOCUSATE SODIUM (COLACE) 100 MG CAPSULE    Take 100 mg by mouth 2 (two) times daily.   IMIPRAMINE (TOFRANIL) 10 MG TABLET    Take 30 mg by mouth at bedtime.   LISINOPRIL  (PRINIVIL,ZESTRIL) 40 MG TABLET    Take 40 mg by mouth daily.   NITROFURANTOIN, MACROCRYSTAL-MONOHYDRATE, (MACROBID) 100 MG CAPSULE    Take 100 mg by mouth 2 (two) times daily. Take for 7 days   OMEPRAZOLE (PRILOSEC) 20 MG CAPSULE    Take 20 mg by mouth daily. Give at 12PM   POLYETHYLENE GLYCOL (MIRALAX / GLYCOLAX) PACKET    Take 17 g by mouth 2 (two) times daily as needed.   PROTEIN (PROCEL) POWD    Take 2 scoop by mouth 2 (two) times daily.   SACCHAROMYCES BOULARDII (FLORASTOR) 250 MG CAPSULE    Take 250 mg by mouth 2 (two) times daily. For 10 days.   SPIRONOLACTONE (ALDACTONE) 25 MG TABLET    Take 1 tablet (25 mg total) by mouth 2 (two) times daily.   TRAMADOL (ULTRAM) 50 MG TABLET    Take 50 mg by mouth every 4 (four) hours as needed (pain.). for pain  Modified Medications   No medications on file  Discontinued Medications   No medications on file     Allergies  Allergen Reactions  . Levofloxacin Other (See Comments)    REACTION: unspecified per MAR     REVIEW OF SYSTEMS:  GENERAL: no change in appetite, no fatigue, no weight changes, no fever, chills or weakness EYES: Denies change in vision, dry eyes, eye pain, itching or discharge EARS: Denies change in hearing, ringing in ears, or earache NOSE: Denies nasal congestion or epistaxis MOUTH and THROAT: Denies oral discomfort, gingival pain or bleeding, pain from teeth or hoarseness   RESPIRATORY: no cough, SOB, DOE, wheezing, hemoptysis CARDIAC: no chest pain, edema or palpitations GI: no abdominal pain, diarrhea, constipation, heart burn, nausea or vomiting GU: Denies dysuria, frequency, hematuria, incontinence, or discharge PSYCHIATRIC: Denies feeling of depression or anxiety. No report of hallucinations, insomnia, paranoia, or agitation   PHYSICAL EXAMINATION  GENERAL APPEARANCE: Well nourished. In no acute distress. Normal body habitus SKIN:  Skin is warm and dry.  HEAD: Normal in size and contour. No evidence of  trauma EYES: Lids open and close normally. No blepharitis, entropion or ectropion. PERRL. Conjunctivae are clear and sclerae are white. Lenses are without opacity EARS: Pinnae are normal. Patient hears normal voice tunes of the examiner MOUTH and THROAT: Lips are without lesions. Oral mucosa is moist and without lesions. Tongue is normal in shape, size, and color and without lesions NECK: supple, trachea midline, no neck masses, no thyroid tenderness, no thyromegaly LYMPHATICS: no LAN in the neck, no supraclavicular LAN RESPIRATORY: breathing is even & unlabored, BS CTAB CARDIAC: RRR,  no murmur,no extra heart sounds, no edema GI: abdomen soft, normal BS, no masses, no tenderness, no hepatomegaly, no splenomegaly; +colostomy EXTREMITIES:  Able to move X 4 extremities PSYCHIATRIC: Alert and oriented X 3. Affect and behavior are appropriate  LABS/RADIOLOGY: Labs reviewed: Basic Metabolic Panel:  Recent Labs  06/17/15 0523 06/18/15 0510  06/24/15 1216 06/27/15 07/05/15  NA 128* 128*  < > 128* 132* 135*  K 4.5 4.5  --  4.0 4.3 3.7  CL 93* 93*  --  92*  --   --   CO2 25 27  --  28  --   --   GLUCOSE 101* 161*  --  130*  --   --   BUN 10 7  < > 10 11 11   CREATININE 0.82 0.66  < > 0.66 0.6 0.5  CALCIUM 9.1 8.7*  --  8.9  --   --   < > = values in this interval not displayed. Liver Function Tests:  Recent Labs  06/10/15 1131 06/11/15 0527 06/12/15 0455 06/27/15  AST 25 18 20 19   ALT 16 15 13* 19  ALKPHOS 68 60 59 74  BILITOT 0.7 0.4 0.3  --   PROT 6.9 6.2* 5.9*  --   ALBUMIN 3.6 3.2* 2.9*  --     Recent Labs  12/15/14 0915 06/10/15 1131  LIPASE 39 38    CBC:  Recent Labs  06/17/15 0523 06/18/15 0510  06/24/15 1216 06/27/15 07/05/15 07/09/15  WBC 13.3* 15.4*  < > 11.2* 11.3 19.9 21.6  NEUTROABS  --   --   --   --  8 16 17   HGB 10.5* 10.2*  --  9.1* 9.1* 9.2* 8.2*  HCT 33.0* 31.0*  --  28.3* 30* 30* 27*  MCV 80.1 77.9*  --  76.3*  --   --   --   PLT 602* 536*  --   598* 605* 547* 501*  < > = values in this interval not displayed.  CBG:  Recent Labs  06/17/15 0740 06/19/15 1131 06/19/15 1629  GLUCAP 97 129* 104*      Ct Chest W Contrast  06/13/2015  CLINICAL DATA:  Colonic mass.  Evaluate for metastases. EXAM: CT CHEST, ABDOMEN, AND PELVIS WITH CONTRAST TECHNIQUE: Multidetector CT imaging of the chest, abdomen and pelvis was performed following the standard protocol during bolus administration of intravenous contrast. CONTRAST:  100 mL Isovue-300 COMPARISON:  CT abdomen and pelvis 12/15/2014 FINDINGS: CT CHEST There is mild diffuse enlargement of the thyroid gland, incompletely visualized. No enlarged axillary, mediastinal, or hilar lymph nodes are identified. The heart is borderline enlarged. Three vessel coronary artery calcification and moderate thoracic aortic calcification are noted. There is no pericardial effusion. There are small bilateral pleural effusions which are new from the prior abdominal CT. Major airways are patent. Evaluation of the lung parenchyma is mildly limited by motion artifact. There is mild centrilobular emphysema. Subsegmental atelectasis is present in both lung bases. There is a new 8 x 5 mm nodule in the right middle lobe (series 4, image 39). Other small lung nodules were not previously imaged and measure 3 mm in the superior segment of the right lower lobe (series 4, image 22), 3 mm in the anterior right upper lobe (series 4, image 18), and 3 mm in the left upper lobe (series 4, image 11). No suspicious lytic or blastic osseous lesions are identified. There is a mild T9 superior endplate compression fracture which is favored to  be chronic. CT ABDOMEN AND PELVIS A 9 mm hypodensity in the right hepatic lobe is unchanged. The gallbladder is surgically absent, and there is mild intrahepatic and extrahepatic biliary dilatation which is slightly less prominent than on the prior study. The spleen, adrenal glands, and pancreas are  unremarkable. Low-density right greater than left renal lesions are unchanged and likely represent cysts, with a coarse calcification associated with a cyst in the right interpolar region. A 7 mm mildly hyperdense lesion extending posteriorly from the right upper pole is unchanged. Enhancing 3.0 x 1.8 cm lesion extending anteriorly from the lower pole of the left kidney is unchanged. A duodenal diverticulum is again noted, slightly less distended than on the prior study. Masslike wall thickening along the superior margin of the distal sigmoid colon has mildly enlarged from the prior CT, spanning approximately 7.1 cm in length and measuring approximately 4.4 cm in thickness on sagittal images (series 603, image 61). The sigmoid colon proximal to this is dilated to approximately 7 cm in diameter, containing gas and stool. There is a large amount of stool throughout the more proximal colon. Oral contrast is present in nondilated loops of small bowel as well as in the cecum. There is aortoiliac atherosclerotic calcification without evidence of aneurysm. No free fluid or enlarged lymph nodes are identified. The bladder is unremarkable. The uterus is absent. No suspicious lytic or blastic osseous lesions are identified. Screws are noted in the proximal left femur. IMPRESSION: 1. Enlargement of sigmoid colon mass consistent with malignancy. Dilatation of the more proximal sigmoid colon which may reflect partial obstruction. Large amount of stool throughout the colon. No small bowel dilatation. 2. No evidence of metastatic disease in the abdomen or pelvis. 3. Small lung nodules which are indeterminate, however an 8 mm right middle lobe nodule is new and suspicious for metastasis. 4. Unchanged left renal mass compatible with renal cell carcinoma. Electronically Signed   By: Logan Bores M.D.   On: 06/13/2015 22:55   Ct Abdomen Pelvis W Contrast  06/13/2015  CLINICAL DATA:  Colonic mass.  Evaluate for metastases. EXAM: CT  CHEST, ABDOMEN, AND PELVIS WITH CONTRAST TECHNIQUE: Multidetector CT imaging of the chest, abdomen and pelvis was performed following the standard protocol during bolus administration of intravenous contrast. CONTRAST:  100 mL Isovue-300 COMPARISON:  CT abdomen and pelvis 12/15/2014 FINDINGS: CT CHEST There is mild diffuse enlargement of the thyroid gland, incompletely visualized. No enlarged axillary, mediastinal, or hilar lymph nodes are identified. The heart is borderline enlarged. Three vessel coronary artery calcification and moderate thoracic aortic calcification are noted. There is no pericardial effusion. There are small bilateral pleural effusions which are new from the prior abdominal CT. Major airways are patent. Evaluation of the lung parenchyma is mildly limited by motion artifact. There is mild centrilobular emphysema. Subsegmental atelectasis is present in both lung bases. There is a new 8 x 5 mm nodule in the right middle lobe (series 4, image 39). Other small lung nodules were not previously imaged and measure 3 mm in the superior segment of the right lower lobe (series 4, image 22), 3 mm in the anterior right upper lobe (series 4, image 18), and 3 mm in the left upper lobe (series 4, image 11). No suspicious lytic or blastic osseous lesions are identified. There is a mild T9 superior endplate compression fracture which is favored to be chronic. CT ABDOMEN AND PELVIS A 9 mm hypodensity in the right hepatic lobe is unchanged. The gallbladder is surgically  absent, and there is mild intrahepatic and extrahepatic biliary dilatation which is slightly less prominent than on the prior study. The spleen, adrenal glands, and pancreas are unremarkable. Low-density right greater than left renal lesions are unchanged and likely represent cysts, with a coarse calcification associated with a cyst in the right interpolar region. A 7 mm mildly hyperdense lesion extending posteriorly from the right upper pole is  unchanged. Enhancing 3.0 x 1.8 cm lesion extending anteriorly from the lower pole of the left kidney is unchanged. A duodenal diverticulum is again noted, slightly less distended than on the prior study. Masslike wall thickening along the superior margin of the distal sigmoid colon has mildly enlarged from the prior CT, spanning approximately 7.1 cm in length and measuring approximately 4.4 cm in thickness on sagittal images (series 603, image 61). The sigmoid colon proximal to this is dilated to approximately 7 cm in diameter, containing gas and stool. There is a large amount of stool throughout the more proximal colon. Oral contrast is present in nondilated loops of small bowel as well as in the cecum. There is aortoiliac atherosclerotic calcification without evidence of aneurysm. No free fluid or enlarged lymph nodes are identified. The bladder is unremarkable. The uterus is absent. No suspicious lytic or blastic osseous lesions are identified. Screws are noted in the proximal left femur. IMPRESSION: 1. Enlargement of sigmoid colon mass consistent with malignancy. Dilatation of the more proximal sigmoid colon which may reflect partial obstruction. Large amount of stool throughout the colon. No small bowel dilatation. 2. No evidence of metastatic disease in the abdomen or pelvis. 3. Small lung nodules which are indeterminate, however an 8 mm right middle lobe nodule is new and suspicious for metastasis. 4. Unchanged left renal mass compatible with renal cell carcinoma. Electronically Signed   By: Logan Bores M.D.   On: 06/13/2015 22:55   Dg Chest Port 1 View  06/10/2015  CLINICAL DATA:  Generalized abdominal pain, shortness of breath EXAM: PORTABLE CHEST 1 VIEW COMPARISON:  08/25/2011 FINDINGS: Cardiomediastinal silhouette is stable. Mild elevation of the left hemidiaphragm again noted. Stable left basilar scarring. No infiltrate or pulmonary edema. IMPRESSION: No active disease.  Stable left basilar scarring.  Electronically Signed   By: Lahoma Crocker M.D.   On: 06/10/2015 13:26    ASSESSMENT/PLAN:  Leukocyosis - wbc 21.6; notify general surgeon, Dr. Hassell Done, regarding elevated wbc and follow-up  Hypertension - give Clonidine 0.1 mg PO X 1; BP BID X 1 week  Adenocarcinoma of sigmoid colon - family chooses no chemo nor radiation and plans to take patient home; Palliative consult with Lavena Stanford, NP [facility] with  [phone]

## 2015-07-11 ENCOUNTER — Other Ambulatory Visit: Payer: Self-pay

## 2015-07-11 MED ORDER — ALPRAZOLAM 0.25 MG PO TABS
ORAL_TABLET | ORAL | Status: DC
Start: 2015-07-11 — End: 2015-07-25

## 2015-07-11 NOTE — Telephone Encounter (Signed)
[facility]

## 2015-07-12 ENCOUNTER — Other Ambulatory Visit: Payer: Self-pay

## 2015-07-12 LAB — CBC AND DIFFERENTIAL
HEMATOCRIT: 30 % — AB (ref 36–46)
HEMOGLOBIN: 8.7 g/dL — AB (ref 12.0–16.0)
Neutrophils Absolute: 10 /uL
PLATELETS: 612 10*3/uL — AB (ref 150–399)
WBC: 13.6 10*3/mL

## 2015-07-12 LAB — BASIC METABOLIC PANEL
BUN: 20 mg/dL (ref 4–21)
Creatinine: 0.5 mg/dL (ref 0.5–1.1)
Glucose: 88 mg/dL
Potassium: 3.1 mmol/L — AB (ref 3.4–5.3)
Sodium: 138 mmol/L (ref 137–147)

## 2015-07-12 MED ORDER — HYDROCODONE-ACETAMINOPHEN 5-325 MG PO TABS: 1.0000 | ORAL_TABLET | Freq: Two times a day (BID) | ORAL | Status: DC | PRN

## 2015-07-12 MED ORDER — TRAMADOL HCL 50 MG PO TABS: 50.0000 mg | ORAL_TABLET | ORAL | Status: DC | PRN

## 2015-07-12 NOTE — Telephone Encounter (Signed)
Rx faxed to [facility] @ [phone], phone number [phone]  

## 2015-07-15 ENCOUNTER — Encounter: Payer: Self-pay | Admitting: Adult Health

## 2015-07-15 ENCOUNTER — Non-Acute Institutional Stay (SKILLED_NURSING_FACILITY): Payer: Medicare Other | Admitting: Adult Health

## 2015-07-15 DIAGNOSIS — D62 Acute posthemorrhagic anemia: Secondary | ICD-10-CM | POA: Diagnosis not present

## 2015-07-15 DIAGNOSIS — E876 Hypokalemia: Secondary | ICD-10-CM | POA: Diagnosis not present

## 2015-07-15 NOTE — Progress Notes (Signed)
Patient ID: Natalie [name], female   DOB: 1931-02-13, 82 y.o.   MRN: [id number]    DATE:    07/15/15  MRN:  [id number]  BIRTHDAY: 25-Oct-1930  Facility:  Nursing Home Location:  [place] Room Number: 1205-P  LEVEL OF CARE:  SNF [id number])  Contact Information    Name Relation Home Work [place] Daughter [phone]  [phone]   High,[place] Daughter [phone]  [phone]       Code Status History    Date Active Date Inactive Code Status Order ID Comments User Context   06/10/2015  8:17 PM 06/17/2015  4:00 PM Full Code [id number]  Robbie Lis, MD Inpatient   11/30/2014  8:19 PM 12/04/2014  4:45 PM Full Code [id number]  Ivor Costa, MD ED   08/15/2011  4:31 AM 09/01/2011  5:49 PM Full Code [id number]  Asa Saunas, MD ED    Advance Directive Documentation        Most Recent Value   Type of Advance Directive  Out of facility DNR (pink MOST or yellow form)   Pre-existing out of facility DNR order (yellow form or pink MOST form)     "MOST" Form in Place?         Chief Complaint  Patient presents with  . Acute Visit    Anemia, hypokalemia    HISTORY OF PRESENT ILLNESS:  This is an 82 year old female who was noted to have hgb 8.7, mcv 80.2 and K 3.1. No complaints of muscle spasm nor dizziness.  She has been admitted to [facility] on 06/25/15 from [facility]. She has PMH of atrial fibrillation, dementia, hypertension and hypothyroidism. She was hospitalized due to melanotic stools. She was started on IV fluids. Her Coumadin was held and she was started on IV Protonix. She had colonoscopy on 06/13/15 showing near obstructing mass mass in rectosigmoid. Biopsies were taken and revealed adenocarcinoma. She had laparoscopy and open loop colostomy in left upper quadrant.  She is currently seen by palliative care and having short-term rehabilitation.  PAST MEDICAL HISTORY:  Past Medical History  Diagnosis Date  . Osteoporosis   .  Hypertension   . Hypothyroidism   . Lower GI bleeding 07/21/11  . Atrial fibrillation ([place])   . COPD (chronic obstructive pulmonary disease) ([place])   . Pneumonia   . Shortness of breath 07/21/11    "anytime"  . Sinus headache 07/21/11    "all the time"  . Bladder infection, chronic   . Arthritis     "everywhere"  . Anxiety   . Chronic lower back pain   . Chronic abdominal pain   . Renal mass, left 2013  . PUD (peptic ulcer disease)   . Chronic cystitis   . Trigeminal neuralgia     /E-chart  . Chronic kidney disease   . Anemia   . Respiratory failure, extubated 08/15/11 08/21/2011  . Normal left ventricular systolic function by 2D [id number] 08/21/2011  . Physical deconditioning   . Colon adenocarcinoma ([place])   . Thrombocytosis (HCC)      CURRENT MEDICATIONS: Reviewed  Patient's Medications  New Prescriptions   No medications on file  Previous Medications   ACETAMINOPHEN (TYLENOL) 500 MG TABLET    Take 500 mg by mouth every 8 (eight) hours as needed for mild pain.   ALPRAZOLAM (XANAX) 0.25 MG TABLET    Take one tablet by mouth every 6 hours as needed for anxiety   AMOXICILLIN (  AMOXIL) 500 MG TABLET    Take 500 mg by mouth every 8 (eight) hours. Give 2 tablets stat and then 1 tablet q8h until gone   BISOPROLOL (ZEBETA) 10 MG TABLET    Take 10 mg by mouth daily.   CLONIDINE (CATAPRES) 0.1 MG TABLET    Take 0.1 mg by mouth daily. Reported on 06/10/2015   DOCUSATE SODIUM (COLACE) 100 MG CAPSULE    Take 100 mg by mouth 2 (two) times daily.   FERROUS SULFATE 325 (65 FE) MG TABLET    Take 325 mg by mouth 2 (two) times daily with a meal.   IMIPRAMINE (TOFRANIL) 10 MG TABLET    Take 15 mg by mouth at bedtime. Take 1-1/2 tabs to = 15 mg po QHS   LISINOPRIL (PRINIVIL,ZESTRIL) 40 MG TABLET    Take 40 mg by mouth daily.   METHADONE (DOLOPHINE) 5 MG TABLET    Take 2.5 mg by mouth. Take 1/2 tablet to = 2.5 mg po at 8AM   OMEPRAZOLE (PRILOSEC) 20 MG CAPSULE    Take 20 mg by mouth daily. Give at  12PM   POLYETHYLENE GLYCOL (MIRALAX / GLYCOLAX) PACKET    Take 17 g by mouth 2 (two) times daily as needed.   POTASSIUM CHLORIDE SA (K-DUR,KLOR-CON) 20 MEQ TABLET    Take 20 mEq by mouth 2 (two) times daily.   PROTEIN (PROCEL) POWD    Take 2 scoop by mouth 2 (two) times daily.   SPIRONOLACTONE (ALDACTONE) 25 MG TABLET    Take 1 tablet (25 mg total) by mouth 2 (two) times daily.  Modified Medications   Modified Medication Previous Medication   TRAMADOL (ULTRAM) 50 MG TABLET traMADol (ULTRAM) 50 MG tablet      Take 0.5 tablets (25 mg total) by mouth 2 (two) times daily as needed for moderate pain. Take 1/2 tablet to = 25 mg po BID PRN pain    Take 25 mg by mouth 2 (two) times daily as needed for moderate pain. Take 1/2 tablet to = 25 mg po BID PRN pain  Discontinued Medications   HYDROCODONE-ACETAMINOPHEN (NORCO) 5-325 MG TABLET    Take 1 tablet by mouth 2 (two) times daily as needed for moderate pain. DO NOT EXCEED 4GM OF APAP IN 24 HOURS FROM ALL SOURCES   NITROFURANTOIN, MACROCRYSTAL-MONOHYDRATE, (MACROBID) 100 MG CAPSULE    Take 100 mg by mouth 2 (two) times daily. Take for 7 days   SACCHAROMYCES BOULARDII (FLORASTOR) 250 MG CAPSULE    Take 250 mg by mouth 2 (two) times daily. For 10 days.   TRAMADOL (ULTRAM) 50 MG TABLET    Take 1 tablet (50 mg total) by mouth every 4 (four) hours as needed (pain.). for pain     Allergies  Allergen Reactions  . Cymbalta [Duloxetine Hcl] Swelling  . Levofloxacin Other (See Comments)    REACTION: unspecified per MAR     REVIEW OF SYSTEMS:  GENERAL: no change in appetite, no fatigue, no weight changes, no fever, chills or weakness EYES: Denies change in vision, dry eyes, eye pain, itching or discharge EARS: Denies change in hearing, ringing in ears, or earache NOSE: Denies nasal congestion or epistaxis MOUTH and THROAT: Denies oral discomfort, gingival pain or bleeding, pain from teeth or hoarseness   RESPIRATORY: no cough, SOB, DOE, wheezing,  hemoptysis CARDIAC: no chest pain, edema or palpitations GI: no abdominal pain, diarrhea, constipation, heart burn, nausea or vomiting GU: Denies dysuria, frequency, hematuria, incontinence, or discharge PSYCHIATRIC:  Denies feeling of depression or anxiety. No report of hallucinations, insomnia, paranoia, or agitation   PHYSICAL EXAMINATION  GENERAL APPEARANCE: Well nourished. In no acute distress. Normal body habitus SKIN:  Skin is warm and dry.  HEAD: Normal in size and contour. No evidence of trauma EYES: Lids open and close normally. No blepharitis, entropion or ectropion. PERRL. Conjunctivae are clear and sclerae are white. Lenses are without opacity EARS: Pinnae are normal. Patient hears normal voice tunes of the examiner MOUTH and THROAT: Lips are without lesions. Oral mucosa is moist and without lesions. Tongue is normal in shape, size, and color and without lesions NECK: supple, trachea midline, no neck masses, no thyroid tenderness, no thyromegaly LYMPHATICS: no LAN in the neck, no supraclavicular LAN RESPIRATORY: breathing is even & unlabored, BS CTAB CARDIAC: RRR, no murmur,no extra heart sounds, no edema GI: abdomen soft, normal BS, no masses, no tenderness, no hepatomegaly, no splenomegaly; +colostomy EXTREMITIES:  Able to move X 4 extremities PSYCHIATRIC: Alert and oriented X 3. Affect and behavior are appropriate  LABS/RADIOLOGY: Labs reviewed: Basic Metabolic Panel:  Recent Labs  06/17/15 0523 06/18/15 0510  06/24/15 1216 06/27/15 07/05/15 07/12/15  NA 128* 128*  < > 128* 132* 135* 138  K 4.5 4.5  --  4.0 4.3 3.7 3.1*  CL 93* 93*  --  92*  --   --   --   CO2 25 27  --  28  --   --   --   GLUCOSE 101* 161*  --  130*  --   --   --   BUN 10 7  < > 10 11 11 20   CREATININE 0.82 0.66  < > 0.66 0.6 0.5 0.5  CALCIUM 9.1 8.7*  --  8.9  --   --   --   < > = values in this interval not displayed. Liver Function Tests:  Recent Labs  06/10/15 1131 06/11/15 0527  06/12/15 0455 06/27/15  AST 25 18 20 19   ALT 16 15 13* 19  ALKPHOS 68 60 59 74  BILITOT 0.7 0.4 0.3  --   PROT 6.9 6.2* 5.9*  --   ALBUMIN 3.6 3.2* 2.9*  --     Recent Labs  12/15/14 0915 06/10/15 1131  LIPASE 39 38    CBC:  Recent Labs  06/17/15 0523 06/18/15 0510  06/24/15 1216  07/05/15 07/09/15 07/12/15  WBC 13.3* 15.4*  < > 11.2*  < > 19.9 21.6 13.6  NEUTROABS  --   --   --   --   < > 16 17 10   HGB 10.5* 10.2*  --  9.1*  < > 9.2* 8.2* 8.7*  HCT 33.0* 31.0*  --  28.3*  < > 30* 27* 30*  MCV 80.1 77.9*  --  76.3*  --   --   --   --   PLT 602* 536*  --  598*  < > 547* 501* 612*  < > = values in this interval not displayed.  CBG:  Recent Labs  06/17/15 0740 06/19/15 1131 06/19/15 1629  GLUCAP 97 129* 104*      No results found.  ASSESSMENT/PLAN:  Anemia, acute blood loss - hgb 8.7; start FeSO4 325 mg 1 tab BID; CBC in 1 week  Hypokalemia - K 3.1; start KCL 20 meq 1 tab PO BID X 2 days then check BMP    MEDINA-VARGAS,MONINA, NP [facility] with  [phone]

## 2015-07-16 ENCOUNTER — Other Ambulatory Visit: Payer: Self-pay

## 2015-07-16 MED ORDER — TRAMADOL HCL 50 MG PO TABS: 25.0000 mg | ORAL_TABLET | Freq: Two times a day (BID) | ORAL | Status: DC | PRN

## 2015-07-16 NOTE — Telephone Encounter (Signed)
[facility]  [address] [place] [address]  Phone: [phone]  Fax: [phone]  

## 2015-07-21 ENCOUNTER — Inpatient Hospital Stay (HOSPITAL_COMMUNITY)
Admission: EM | Admit: 2015-07-21 | Discharge: 2015-07-25 | DRG: 689 | Disposition: A | Discharge status: Hospice | Payer: Medicare Other | Attending: Internal Medicine | Admitting: Internal Medicine

## 2015-07-21 ENCOUNTER — Encounter (HOSPITAL_COMMUNITY): Payer: Self-pay | Admitting: Emergency Medicine

## 2015-07-21 DIAGNOSIS — F1120 Opioid dependence, uncomplicated: Secondary | ICD-10-CM | POA: Diagnosis present

## 2015-07-21 DIAGNOSIS — I10 Essential (primary) hypertension: Secondary | ICD-10-CM | POA: Diagnosis present

## 2015-07-21 DIAGNOSIS — R509 Fever, unspecified: Secondary | ICD-10-CM | POA: Diagnosis not present

## 2015-07-21 DIAGNOSIS — Z79899 Other long term (current) drug therapy: Secondary | ICD-10-CM

## 2015-07-21 DIAGNOSIS — Z933 Colostomy status: Secondary | ICD-10-CM

## 2015-07-21 DIAGNOSIS — G894 Chronic pain syndrome: Secondary | ICD-10-CM | POA: Diagnosis present

## 2015-07-21 DIAGNOSIS — J449 Chronic obstructive pulmonary disease, unspecified: Secondary | ICD-10-CM | POA: Diagnosis present

## 2015-07-21 DIAGNOSIS — Z87891 Personal history of nicotine dependence: Secondary | ICD-10-CM

## 2015-07-21 DIAGNOSIS — N390 Urinary tract infection, site not specified: Principal | ICD-10-CM | POA: Diagnosis present

## 2015-07-21 DIAGNOSIS — R627 Adult failure to thrive: Secondary | ICD-10-CM | POA: Diagnosis present

## 2015-07-21 DIAGNOSIS — Z888 Allergy status to other drugs, medicaments and biological substances status: Secondary | ICD-10-CM

## 2015-07-21 DIAGNOSIS — F0280 Dementia in other diseases classified elsewhere without behavioral disturbance: Secondary | ICD-10-CM | POA: Diagnosis present

## 2015-07-21 DIAGNOSIS — M7989 Other specified soft tissue disorders: Secondary | ICD-10-CM

## 2015-07-21 DIAGNOSIS — F419 Anxiety disorder, unspecified: Secondary | ICD-10-CM | POA: Diagnosis present

## 2015-07-21 DIAGNOSIS — Z961 Presence of intraocular lens: Secondary | ICD-10-CM | POA: Diagnosis present

## 2015-07-21 DIAGNOSIS — D630 Anemia in neoplastic disease: Secondary | ICD-10-CM | POA: Diagnosis present

## 2015-07-21 DIAGNOSIS — I4891 Unspecified atrial fibrillation: Secondary | ICD-10-CM | POA: Diagnosis present

## 2015-07-21 DIAGNOSIS — G9341 Metabolic encephalopathy: Secondary | ICD-10-CM | POA: Diagnosis present

## 2015-07-21 DIAGNOSIS — R109 Unspecified abdominal pain: Secondary | ICD-10-CM

## 2015-07-21 DIAGNOSIS — C7801 Secondary malignant neoplasm of right lung: Secondary | ICD-10-CM | POA: Diagnosis present

## 2015-07-21 DIAGNOSIS — Z9841 Cataract extraction status, right eye: Secondary | ICD-10-CM

## 2015-07-21 DIAGNOSIS — G309 Alzheimer's disease, unspecified: Secondary | ICD-10-CM | POA: Diagnosis present

## 2015-07-21 DIAGNOSIS — K529 Noninfective gastroenteritis and colitis, unspecified: Secondary | ICD-10-CM | POA: Diagnosis present

## 2015-07-21 DIAGNOSIS — D509 Iron deficiency anemia, unspecified: Secondary | ICD-10-CM | POA: Diagnosis present

## 2015-07-21 DIAGNOSIS — C187 Malignant neoplasm of sigmoid colon: Secondary | ICD-10-CM | POA: Diagnosis present

## 2015-07-21 DIAGNOSIS — E039 Hypothyroidism, unspecified: Secondary | ICD-10-CM | POA: Diagnosis present

## 2015-07-21 DIAGNOSIS — Z8042 Family history of malignant neoplasm of prostate: Secondary | ICD-10-CM

## 2015-07-21 DIAGNOSIS — C7802 Secondary malignant neoplasm of left lung: Secondary | ICD-10-CM | POA: Diagnosis present

## 2015-07-21 DIAGNOSIS — Z515 Encounter for palliative care: Secondary | ICD-10-CM | POA: Diagnosis present

## 2015-07-21 DIAGNOSIS — Z66 Do not resuscitate: Secondary | ICD-10-CM | POA: Diagnosis present

## 2015-07-21 DIAGNOSIS — G9340 Encephalopathy, unspecified: Secondary | ICD-10-CM

## 2015-07-21 DIAGNOSIS — M810 Age-related osteoporosis without current pathological fracture: Secondary | ICD-10-CM | POA: Diagnosis present

## 2015-07-21 DIAGNOSIS — K55039 Acute (reversible) ischemia of large intestine, extent unspecified: Secondary | ICD-10-CM | POA: Diagnosis present

## 2015-07-21 DIAGNOSIS — Z9842 Cataract extraction status, left eye: Secondary | ICD-10-CM

## 2015-07-21 NOTE — ED Notes (Signed)
Bed: [id number] Expected date:  Expected time:  Means of arrival:  Comments: 36 F febrile

## 2015-07-21 NOTE — ED Provider Notes (Signed)
CSN: [id number]     Arrival date & time 07/21/15  2238 History  By signing my name below, I, Rowan Blase, attest that this documentation has been prepared under the direction and in the presence of Varney Biles, MD . Electronically Signed: Rowan Blase, Scribe. 07/21/2015. 12:08 AM.   Chief Complaint  Patient presents with  . Fever   The history is provided by the patient and a relative. No language interpreter was used.   HPI Comments:  Natalie [name] is a 82 y.o. female with PMHx of HTN, COPD, CKD, colon cancer and dementia who presents to the Emergency Department via EMS from [facility] complaining of intermittent fever tmax 99.4 (oral) earlier tonight. Pt reports new mild abdominal pain currently; daughter states this pain is chronic. Pt's daughter reports associated agitation tonight and increased ankle swelling recently. Daughter expresses concern for UTI. Pt was given Ativan 1mg , Versed 5 mg, Xanex 0.5 mg and Tylenol 1000 mg PTA. Pt also reports recent dental surgery that is causing some dental pain. Pt has a colostomy in pace. Pt denies chest pain, shortness of breath, cough, or dysuria.  Past Medical History  Diagnosis Date  . Osteoporosis   . Hypertension   . Hypothyroidism   . Lower GI bleeding 07/21/11  . Atrial fibrillation ([place])   . COPD (chronic obstructive pulmonary disease) ([place])   . Pneumonia   . Shortness of breath 07/21/11    "anytime"  . Sinus headache 07/21/11    "all the time"  . Bladder infection, chronic   . Arthritis     "everywhere"  . Anxiety   . Chronic lower back pain   . Chronic abdominal pain   . Renal mass, left 2013  . PUD (peptic ulcer disease)   . Chronic cystitis   . Trigeminal neuralgia     /E-chart  . Chronic kidney disease   . Anemia   . Respiratory failure, extubated 08/15/11 08/21/2011  . Normal left ventricular systolic function by 2D [id number] 08/21/2011  . Physical deconditioning   . Colon adenocarcinoma  ([place])   . Thrombocytosis [facility])    Past Surgical History  Procedure Laterality Date  . Vaginal hysterectomy      partial  . Cataract extraction w/ intraocular lens  implant, bilateral    . Percutaneous pinning femoral neck fracture  11/2003    w/closed reduction ; left/E-chart  . Laceration repair  11/2003    left eyebrow  . Bilateral salpingoophorectomy  12/2005    lap/E-chart  . Cholecystectomy  1960  . Appendectomy      presumed/E-chart  . Esophagogastroduodenoscopy  07/23/2011    Procedure: ESOPHAGOGASTRODUODENOSCOPY (EGD);  Surgeon: Wonda Horner, MD;  Location: [facility] ENDOSCOPY;  Service: Endoscopy;  Laterality: N/A;  . Sigmoidoscopy  06/13/2015    Procedure: SIGMOIDOSCOPY;  Surgeon: Ronald Lobo, MD;  Location: [facility] ENDOSCOPY;  Service: Endoscopy;;  . Colectomy with colostomy creation/hartmann procedure N/A 06/17/2015    Procedure: LAPAROSCOPY LOOP COLOSTOMY;  Surgeon: Johnathan Hausen, MD;  Location: [facility] ORS;  Service: General;  Laterality: N/A;   Family History  Problem Relation Age of Onset  . Prostate cancer Father     Patient states that his father probably had prostate cancer   Social History  Substance Use Topics  . Smoking status: Former Smoker -- 1.00 packs/day    Types: Cigarettes    Quit date: 09/11/2009  . Smokeless tobacco: Never Used     Comment: 07/21/11 "can't remember when I quit  smoking"  . Alcohol Use: 1.2 oz/week    2 Glasses of wine per week     Comment: 07/21/11 "seldom drink"   OB History    No data available     Review of Systems  Constitutional: Positive for fever.  Cardiovascular: Positive for leg swelling.  Gastrointestinal: Positive for abdominal pain.  Psychiatric/Behavioral: Positive for agitation.  All other systems reviewed and are negative.  Allergies  Cymbalta and Levofloxacin  Home Medications   Prior to Admission medications   Medication Sig Start Date End Date Taking? Authorizing Provider  acetaminophen (TYLENOL) 500 MG tablet Take  500 mg by mouth every 8 (eight) hours as needed for mild pain.   Yes Historical Provider, MD  ALPRAZolam Duanne Moron) 0.25 MG tablet Take one tablet by mouth every 6 hours as needed for anxiety 07/11/15  Yes Tiffany L Reed, DO  bisoprolol (ZEBETA) 10 MG tablet Take 10 mg by mouth daily. Hold if HR is less than 60 or SBP is less then 110.   Yes Historical Provider, MD  cloNIDine (CATAPRES) 0.1 MG tablet Take 0.1 mg by mouth daily. Reported on 06/10/2015   Yes Historical Provider, MD  docusate sodium (COLACE) 100 MG capsule Take 100 mg by mouth 2 (two) times daily.   Yes Historical Provider, MD  ferrous sulfate 325 (65 FE) MG tablet Take 325 mg by mouth 2 (two) times daily with a meal.   Yes Historical Provider, MD  HYDROcodone-acetaminophen (NORCO/VICODIN) 5-325 MG tablet Take 1 tablet by mouth 2 (two) times daily as needed for severe pain.   Yes Historical Provider, MD  imipramine (TOFRANIL) 10 MG tablet Take 15 mg by mouth at bedtime. Take 1-1/2 tabs to = 15 mg po QHS 07/11/14  Yes Historical Provider, MD  lisinopril (PRINIVIL,ZESTRIL) 40 MG tablet Take 40 mg by mouth daily.   Yes Historical Provider, MD  methadone (DOLOPHINE) 5 MG tablet Take 2.5 mg by mouth. Take 1/2 tablet to = 2.5 mg po at 8AM   Yes Historical Provider, MD  omeprazole (PRILOSEC) 20 MG capsule Take 20 mg by mouth daily. Give at 12PM   Yes Historical Provider, MD  polyethylene glycol (MIRALAX / GLYCOLAX) packet Take 17 g by mouth 2 (two) times daily as needed for mild constipation.    Yes Historical Provider, MD  Protein (PROCEL) POWD Take 2 scoop by mouth 2 (two) times daily.   Yes Historical Provider, MD  simethicone (MYLICON) 80 MG chewable tablet Chew 160 mg by mouth every 6 (six) hours as needed for flatulence.   Yes Historical Provider, MD  spironolactone (ALDACTONE) 25 MG tablet Take 1 tablet (25 mg total) by mouth 2 (two) times daily. 12/03/14  Yes Oswald Hillock, MD  traMADol (ULTRAM) 50 MG tablet Take 0.5 tablets (25 mg total) by  mouth 2 (two) times daily as needed for moderate pain. Take 1/2 tablet to = 25 mg po BID PRN pain 07/16/15  Yes Estill Dooms, MD   BP 176/107 mmHg  Pulse 82  Temp(Src) 98.7 F (37.1 C) (Rectal)  Resp 16  SpO2 96% Physical Exam  Constitutional: She appears well-developed and well-nourished. No distress.  HENT:  Head: Normocephalic and atraumatic.  Mouth/Throat: Mucous membranes are dry.  Eyes: EOM are normal.  Neck: Normal range of motion.  Cardiovascular: Normal rate, regular rhythm and normal heart sounds.   capillary refill less than 3 seconds  Pulmonary/Chest: Effort normal and breath sounds normal.  Abdominal: Soft. She exhibits no distension. There is  tenderness.  LLQ tenderness  Musculoskeletal: Normal range of motion.  unilateral swelling in lower extremitys, mild TTP  Neurological: She is alert.  Skin: Skin is warm and dry.  Psychiatric: She has a normal mood and affect. Judgment normal.  Nursing note and vitals reviewed.  ED Course  Procedures  DIAGNOSTIC STUDIES:  Oxygen Saturation is 100% on RA, normal by my interpretation.    COORDINATION OF CARE:  11:26 PM Will order chest x-ray, CBC, BMP, UA and GI panel. Discussed treatment plan with pt at bedside and pt agreed to plan.  Labs Review Labs Reviewed  CBC WITH DIFFERENTIAL/PLATELET - Abnormal; Notable for the following:    WBC 12.7 (*)    RBC 3.58 (*)    Hemoglobin 8.7 (*)    HCT 27.2 (*)    MCV 76.0 (*)    MCH 24.3 (*)    RDW 16.6 (*)    Platelets 556 (*)    Neutro Abs 8.9 (*)    Monocytes Absolute 1.2 (*)    All other components within normal limits  BASIC METABOLIC PANEL - Abnormal; Notable for the following:    Potassium 3.3 (*)    Chloride 100 (*)    Glucose, Bld 113 (*)    Calcium 8.7 (*)    All other components within normal limits  URINALYSIS, ROUTINE W REFLEX MICROSCOPIC (NOT AT [facility]) - Abnormal; Notable for the following:    APPearance CLOUDY (*)    Hgb urine dipstick MODERATE (*)     Nitrite POSITIVE (*)    Leukocytes, UA SMALL (*)    All other components within normal limits  URINE MICROSCOPIC-ADD ON - Abnormal; Notable for the following:    Squamous Epithelial / LPF 0-5 (*)    Bacteria, UA MANY (*)    All other components within normal limits  URINE CULTURE  GASTROINTESTINAL PANEL BY PCR, STOOL (REPLACES STOOL CULTURE)    Imaging Review Ct Abdomen Pelvis Wo Contrast  07/22/2015  CLINICAL DATA:  Low grade fever and right lower quadrant pain. Recent surgery. Elevated white cell count. History of hypertension, colon cancer post colectomy and colostomy, chronic back pain and abdominal pain. Previous cholecystectomy and appendectomy. No IV access. EXAM: CT ABDOMEN AND PELVIS WITHOUT CONTRAST TECHNIQUE: Multidetector CT imaging of the abdomen and pelvis was performed following the standard protocol without IV contrast. COMPARISON:  06/13/2015 FINDINGS: Atelectasis in the lung bases with minimal pleural effusions. Coronary artery calcifications. Surgical absence of the gallbladder. No bile duct dilatation. The unenhanced appearance of the liver, spleen, pancreas, adrenal glands, inferior vena cava, and retroperitoneal lymph nodes is unremarkable. Prominent calcification in the abdominal aorta without aneurysm. No hydronephrosis or stone in either kidney. Right kidney demonstrates multiple exophytic lesions likely representing cysts. One of the lesions is hyperdense probably representing hemorrhagic cyst. One of the lesions in the lower pole contains calcification consistent with complex cyst. No change since prior study. On the left kidney, there is a solid-appearing lesion measuring about 2.2 cm diameter. No definite change in size since previous study. Small renal cell carcinoma or adenoma not excluded. The stomach, small bowel, and colon are not abnormally distended. Diverticulum at the second portion of the duodenum. No free air or free fluid in the abdomen. Pelvis: Interval  postoperative changes with left lower quadrant descending colostomy. The sigmoid colon demonstrates prominent wall thickening involving the segments leading into and out out the colostomy. This could represent colitis of nonspecific etiology. The previous sigmoid colon mass appears to have  been removed in the interval. Bladder wall is not thickened. No free or loculated pelvic fluid collections. Appendix is not identified. Postoperative changes in the left hip. Tarlov cysts in the sacrum. Degenerative changes in the lumbar spine. No destructive bone lesions. IMPRESSION: Postoperative changes with left lower quadrant colostomy and resection of the sigmoid lesion seen previously. The sigmoid colon entering and exiting the colostomy head extending down towards the rectum demonstrates diffuse wall thickening suggesting colitis of nonspecific etiology. This could represent infectious or inflammatory process or ischemic process. Solid mass on the left kidney appears unchanged since previous study. Cysts on the right kidney are also unchanged, including a calcified cyst and a hemorrhagic cyst. Small bilateral pleural effusions have improved since prior study. Electronically Signed   By: Lucienne Capers M.D.   On: 07/22/2015 06:11   Dg Chest 2 View  07/22/2015  CLINICAL DATA:  Intermittent fever.  Lower extremity swelling. EXAM: CHEST  2 VIEW COMPARISON:  06/10/2015 FINDINGS: There is unchanged mild left hemidiaphragm elevation. Mild linear scarring in the lateral left lung base. The lungs are otherwise clear. Hilar and mediastinal contours are unremarkable and unchanged. Pulmonary vasculature is normal. No effusions are evident. IMPRESSION: No active cardiopulmonary disease. Electronically Signed   By: Andreas Newport M.D.   On: 07/22/2015 01:02   I have personally reviewed and evaluated these images and lab results as part of my medical decision-making.   EKG Interpretation None      MDM   Final diagnoses:   UTI (lower urinary tract infection)  Colitis    I personally performed the services described in this documentation, which was scribed in my presence. The recorded information has been reviewed and is accurate.  Pt comes in with cc of fevers from rehab. Recent bout of UTI. PATIENT HAS COLON CA - which is being treated with palliative care - AND SHE IS NOT AWARE OF IT per family's request. Pt has some R sided abd tenderness.  Will get infection screen.  8:01 AM UA is + - but with recent abd surgery, RLQ abd pain- it is a good idea to get CT scan. Suspicion for abd infection low, so CT non contrast done. CT shows colitis. Pt is having BM still - and daughter concerned about that. Daughter also concerned about the ostomy area being irritated. Gen Surgery will be on for Consult.     Varney Biles, MD 07/22/15 [phone]

## 2015-07-21 NOTE — ED Notes (Signed)
Brought in by EMS from [facility] with c/o fever.  Per EMS, staff at the facility reported that pt has been more agitated than usual and has had fever tonight--- had a temp of T99.4(oral).  Pt's daughter wanted her to be emergently evaluated.  Pt was given Ativan 1 mg IM without effect to her agitation; was then given Versed 5 mg IV which helped her calmed down.  Was also given Xanax 0.5 mg po and Tylenol 1000 mg prior to EMS transport.  Hx of dementia and colon cancer--- has colostomy in place.  Per facility report, pt has been having stool output via the colostomy and rectum----- requested to have this evaluated also.  Pt is to be discharged home under hospice on Monday.

## 2015-07-22 ENCOUNTER — Ambulatory Visit (HOSPITAL_COMMUNITY)
Admit: 2015-07-22 | Discharge: 2015-07-22 | Disposition: A | Discharge status: Home / Self Care | Payer: Medicare Other | Attending: Emergency Medicine | Admitting: Emergency Medicine

## 2015-07-22 ENCOUNTER — Emergency Department (HOSPITAL_COMMUNITY): Payer: Medicare Other

## 2015-07-22 DIAGNOSIS — Z933 Colostomy status: Secondary | ICD-10-CM | POA: Diagnosis not present

## 2015-07-22 DIAGNOSIS — F419 Anxiety disorder, unspecified: Secondary | ICD-10-CM | POA: Diagnosis present

## 2015-07-22 DIAGNOSIS — Z888 Allergy status to other drugs, medicaments and biological substances status: Secondary | ICD-10-CM | POA: Diagnosis not present

## 2015-07-22 DIAGNOSIS — G9341 Metabolic encephalopathy: Secondary | ICD-10-CM | POA: Diagnosis present

## 2015-07-22 DIAGNOSIS — M810 Age-related osteoporosis without current pathological fracture: Secondary | ICD-10-CM | POA: Diagnosis present

## 2015-07-22 DIAGNOSIS — E039 Hypothyroidism, unspecified: Secondary | ICD-10-CM | POA: Diagnosis present

## 2015-07-22 DIAGNOSIS — R627 Adult failure to thrive: Secondary | ICD-10-CM | POA: Diagnosis present

## 2015-07-22 DIAGNOSIS — R509 Fever, unspecified: Secondary | ICD-10-CM | POA: Diagnosis present

## 2015-07-22 DIAGNOSIS — Z79899 Other long term (current) drug therapy: Secondary | ICD-10-CM | POA: Diagnosis not present

## 2015-07-22 DIAGNOSIS — F0280 Dementia in other diseases classified elsewhere without behavioral disturbance: Secondary | ICD-10-CM | POA: Diagnosis present

## 2015-07-22 DIAGNOSIS — C7801 Secondary malignant neoplasm of right lung: Secondary | ICD-10-CM | POA: Diagnosis present

## 2015-07-22 DIAGNOSIS — C7802 Secondary malignant neoplasm of left lung: Secondary | ICD-10-CM | POA: Diagnosis present

## 2015-07-22 DIAGNOSIS — N390 Urinary tract infection, site not specified: Secondary | ICD-10-CM | POA: Diagnosis present

## 2015-07-22 DIAGNOSIS — K55039 Acute (reversible) ischemia of large intestine, extent unspecified: Secondary | ICD-10-CM | POA: Diagnosis present

## 2015-07-22 DIAGNOSIS — G309 Alzheimer's disease, unspecified: Secondary | ICD-10-CM | POA: Diagnosis present

## 2015-07-22 DIAGNOSIS — D509 Iron deficiency anemia, unspecified: Secondary | ICD-10-CM | POA: Diagnosis present

## 2015-07-22 DIAGNOSIS — G894 Chronic pain syndrome: Secondary | ICD-10-CM | POA: Diagnosis present

## 2015-07-22 DIAGNOSIS — K529 Noninfective gastroenteritis and colitis, unspecified: Secondary | ICD-10-CM | POA: Diagnosis present

## 2015-07-22 DIAGNOSIS — Z87891 Personal history of nicotine dependence: Secondary | ICD-10-CM | POA: Diagnosis not present

## 2015-07-22 DIAGNOSIS — J449 Chronic obstructive pulmonary disease, unspecified: Secondary | ICD-10-CM | POA: Diagnosis present

## 2015-07-22 DIAGNOSIS — Z961 Presence of intraocular lens: Secondary | ICD-10-CM | POA: Diagnosis present

## 2015-07-22 DIAGNOSIS — I10 Essential (primary) hypertension: Secondary | ICD-10-CM | POA: Diagnosis present

## 2015-07-22 DIAGNOSIS — C187 Malignant neoplasm of sigmoid colon: Secondary | ICD-10-CM | POA: Diagnosis present

## 2015-07-22 DIAGNOSIS — M7989 Other specified soft tissue disorders: Secondary | ICD-10-CM | POA: Diagnosis not present

## 2015-07-22 DIAGNOSIS — I4891 Unspecified atrial fibrillation: Secondary | ICD-10-CM | POA: Diagnosis present

## 2015-07-22 DIAGNOSIS — F1120 Opioid dependence, uncomplicated: Secondary | ICD-10-CM | POA: Diagnosis present

## 2015-07-22 DIAGNOSIS — Z515 Encounter for palliative care: Secondary | ICD-10-CM | POA: Diagnosis present

## 2015-07-22 DIAGNOSIS — Z8042 Family history of malignant neoplasm of prostate: Secondary | ICD-10-CM | POA: Diagnosis not present

## 2015-07-22 DIAGNOSIS — Z9841 Cataract extraction status, right eye: Secondary | ICD-10-CM | POA: Diagnosis not present

## 2015-07-22 DIAGNOSIS — D630 Anemia in neoplastic disease: Secondary | ICD-10-CM | POA: Diagnosis present

## 2015-07-22 DIAGNOSIS — Z66 Do not resuscitate: Secondary | ICD-10-CM | POA: Diagnosis present

## 2015-07-22 DIAGNOSIS — Z9842 Cataract extraction status, left eye: Secondary | ICD-10-CM | POA: Diagnosis not present

## 2015-07-22 DIAGNOSIS — G9340 Encephalopathy, unspecified: Secondary | ICD-10-CM | POA: Diagnosis not present

## 2015-07-22 LAB — GASTROINTESTINAL PANEL BY PCR, STOOL (REPLACES STOOL CULTURE)
Adenovirus F40/41: NOT DETECTED
Astrovirus: NOT DETECTED
CRYPTOSPORIDIUM: NOT DETECTED
Campylobacter species: NOT DETECTED
Cyclospora cayetanensis: NOT DETECTED
E. coli O157: NOT DETECTED
ENTAMOEBA HISTOLYTICA: NOT DETECTED
ENTEROAGGREGATIVE E COLI (EAEC): NOT DETECTED
Enteropathogenic E coli (EPEC): NOT DETECTED
Enterotoxigenic E coli (ETEC): NOT DETECTED
GIARDIA LAMBLIA: NOT DETECTED
Norovirus GI/GII: NOT DETECTED
PLESIMONAS SHIGELLOIDES: NOT DETECTED
Rotavirus A: NOT DETECTED
SALMONELLA SPECIES: NOT DETECTED
SHIGELLA/ENTEROINVASIVE E COLI (EIEC): NOT DETECTED
Sapovirus (I, II, IV, and V): NOT DETECTED
Shiga like toxin producing E coli (STEC): NOT DETECTED
VIBRIO CHOLERAE: NOT DETECTED
Vibrio species: NOT DETECTED
YERSINIA ENTEROCOLITICA: NOT DETECTED

## 2015-07-22 LAB — URINALYSIS, ROUTINE W REFLEX MICROSCOPIC
Bilirubin Urine: NEGATIVE
Glucose, UA: NEGATIVE mg/dL
Ketones, ur: NEGATIVE mg/dL
NITRITE: POSITIVE — AB
Protein, ur: NEGATIVE mg/dL
Specific Gravity, Urine: 1.014 (ref 1.005–1.030)
pH: 5.5 (ref 5.0–8.0)

## 2015-07-22 LAB — URINE MICROSCOPIC-ADD ON

## 2015-07-22 LAB — CBC WITH DIFFERENTIAL/PLATELET
BASOS PCT: 0 %
Basophils Absolute: 0 10*3/uL (ref 0.0–0.1)
EOS ABS: 0.1 10*3/uL (ref 0.0–0.7)
Eosinophils Relative: 1 %
HEMATOCRIT: 27.2 % — AB (ref 36.0–46.0)
Hemoglobin: 8.7 g/dL — ABNORMAL LOW (ref 12.0–15.0)
Lymphocytes Relative: 19 %
Lymphs Abs: 2.4 10*3/uL (ref 0.7–4.0)
MCH: 24.3 pg — ABNORMAL LOW (ref 26.0–34.0)
MCHC: 32 g/dL (ref 30.0–36.0)
MCV: 76 fL — ABNORMAL LOW (ref 78.0–100.0)
MONO ABS: 1.2 10*3/uL — AB (ref 0.1–1.0)
MONOS PCT: 9 %
NEUTROS ABS: 8.9 10*3/uL — AB (ref 1.7–7.7)
NEUTROS PCT: 71 %
Platelets: 556 10*3/uL — ABNORMAL HIGH (ref 150–400)
RBC: 3.58 MIL/uL — ABNORMAL LOW (ref 3.87–5.11)
RDW: 16.6 % — AB (ref 11.5–15.5)
WBC: 12.7 10*3/uL — ABNORMAL HIGH (ref 4.0–10.5)

## 2015-07-22 LAB — BASIC METABOLIC PANEL
ANION GAP: 9 (ref 5–15)
BUN: 10 mg/dL (ref 6–20)
CALCIUM: 8.7 mg/dL — AB (ref 8.9–10.3)
CO2: 28 mmol/L (ref 22–32)
CREATININE: 0.56 mg/dL (ref 0.44–1.00)
Chloride: 100 mmol/L — ABNORMAL LOW (ref 101–111)
Glucose, Bld: 113 mg/dL — ABNORMAL HIGH (ref 65–99)
Potassium: 3.3 mmol/L — ABNORMAL LOW (ref 3.5–5.1)
Sodium: 137 mmol/L (ref 135–145)

## 2015-07-22 LAB — MRSA PCR SCREENING: MRSA by PCR: NEGATIVE

## 2015-07-22 MED ORDER — SODIUM CHLORIDE 0.9 % IV SOLN: 250.0000 mL | INTRAVENOUS | Status: DC | PRN

## 2015-07-22 MED ORDER — FLUCONAZOLE IN SODIUM CHLORIDE 100-0.9 MG/50ML-% IV SOLN
100.0000 mg | INTRAVENOUS | Status: DC
  Administered 2015-07-22 – 2015-07-25 (×4): 100 mg via INTRAVENOUS
  Filled 2015-07-22 (×4): qty 50

## 2015-07-22 MED ORDER — SODIUM CHLORIDE 0.9 % IV SOLN
Freq: Once | INTRAVENOUS | Status: AC
  Administered 2015-07-22: 02:00:00 via INTRAVENOUS

## 2015-07-22 MED ORDER — DIATRIZOATE MEGLUMINE & SODIUM 66-10 % PO SOLN
30.0000 mL | Freq: Once | ORAL | Status: AC
  Administered 2015-07-22: 30 mL via ORAL

## 2015-07-22 MED ORDER — ONDANSETRON HCL 4 MG PO TABS: 4.0000 mg | ORAL_TABLET | Freq: Four times a day (QID) | ORAL | Status: DC | PRN

## 2015-07-22 MED ORDER — METHADONE HCL 5 MG PO TABS
2.5000 mg | ORAL_TABLET | Freq: Every day | ORAL | Status: DC
  Administered 2015-07-22 – 2015-07-24 (×3): 2.5 mg via ORAL
  Filled 2015-07-22 (×3): qty 1

## 2015-07-22 MED ORDER — ONDANSETRON HCL 4 MG/2ML IJ SOLN: 4.0000 mg | Freq: Four times a day (QID) | INTRAMUSCULAR | Status: DC | PRN

## 2015-07-22 MED ORDER — CLONIDINE HCL 0.1 MG PO TABS
0.1000 mg | ORAL_TABLET | Freq: Every day | ORAL | Status: DC
  Administered 2015-07-22 – 2015-07-25 (×4): 0.1 mg via ORAL
  Filled 2015-07-22 (×4): qty 1

## 2015-07-22 MED ORDER — SODIUM CHLORIDE 0.9% FLUSH
3.0000 mL | Freq: Two times a day (BID) | INTRAVENOUS | Status: DC
  Administered 2015-07-23: 10 mL via INTRAVENOUS
  Administered 2015-07-24 (×2): 3 mL via INTRAVENOUS

## 2015-07-22 MED ORDER — ALPRAZOLAM 0.25 MG PO TABS
0.2500 mg | ORAL_TABLET | Freq: Three times a day (TID) | ORAL | Status: DC | PRN
  Administered 2015-07-22 – 2015-07-25 (×9): 0.25 mg via ORAL
  Filled 2015-07-22 (×9): qty 1

## 2015-07-22 MED ORDER — LABETALOL HCL 5 MG/ML IV SOLN
10.0000 mg | INTRAVENOUS | Status: DC | PRN
  Filled 2015-07-22: qty 4

## 2015-07-22 MED ORDER — DEXTROSE 5 % IV SOLN
1.0000 g | Freq: Once | INTRAVENOUS | Status: AC
  Administered 2015-07-22: 1 g via INTRAVENOUS
  Filled 2015-07-22: qty 10

## 2015-07-22 MED ORDER — IMIPRAMINE HCL 10 MG PO TABS
15.0000 mg | ORAL_TABLET | Freq: Every day | ORAL | Status: DC
  Administered 2015-07-22 – 2015-07-24 (×3): 15 mg via ORAL
  Filled 2015-07-22 (×5): qty 2

## 2015-07-22 MED ORDER — METRONIDAZOLE IN NACL 5-0.79 MG/ML-% IV SOLN
500.0000 mg | Freq: Three times a day (TID) | INTRAVENOUS | Status: DC
  Administered 2015-07-22 – 2015-07-24 (×7): 500 mg via INTRAVENOUS
  Filled 2015-07-22 (×7): qty 100

## 2015-07-22 MED ORDER — HYDROCODONE-ACETAMINOPHEN 5-325 MG PO TABS
1.0000 | ORAL_TABLET | Freq: Two times a day (BID) | ORAL | Status: DC | PRN
Start: 2015-07-22 — End: 2015-07-24
  Administered 2015-07-23 – 2015-07-24 (×3): 1 via ORAL
  Filled 2015-07-22 (×3): qty 1

## 2015-07-22 MED ORDER — LISINOPRIL 20 MG PO TABS
40.0000 mg | ORAL_TABLET | Freq: Every day | ORAL | Status: DC
  Administered 2015-07-22 – 2015-07-25 (×4): 40 mg via ORAL
  Filled 2015-07-22 (×4): qty 2

## 2015-07-22 MED ORDER — METRONIDAZOLE IN NACL 5-0.79 MG/ML-% IV SOLN
500.0000 mg | Freq: Once | INTRAVENOUS | Status: AC
  Administered 2015-07-22: 500 mg via INTRAVENOUS
  Filled 2015-07-22: qty 100

## 2015-07-22 MED ORDER — ENOXAPARIN SODIUM 40 MG/0.4ML ~~LOC~~ SOLN
40.0000 mg | Freq: Every day | SUBCUTANEOUS | Status: DC
  Administered 2015-07-22 – 2015-07-25 (×4): 40 mg via SUBCUTANEOUS
  Filled 2015-07-22 (×4): qty 0.4

## 2015-07-22 MED ORDER — SODIUM CHLORIDE 0.9 % IV BOLUS (SEPSIS)
1000.0000 mL | Freq: Once | INTRAVENOUS | Status: AC
Start: 2015-07-22 — End: 2015-07-22
  Administered 2015-07-22: 1000 mL via INTRAVENOUS

## 2015-07-22 MED ORDER — DEXTROSE-NACL 5-0.45 % IV SOLN
INTRAVENOUS | Status: DC
Start: 2015-07-22 — End: 2015-07-22

## 2015-07-22 MED ORDER — SPIRONOLACTONE 25 MG PO TABS: 25.0000 mg | ORAL_TABLET | Freq: Two times a day (BID) | ORAL | Status: DC

## 2015-07-22 MED ORDER — DEXTROSE 5 % IV SOLN
1.0000 g | INTRAVENOUS | Status: DC
  Administered 2015-07-22 – 2015-07-24 (×3): 1 g via INTRAVENOUS
  Filled 2015-07-22 (×4): qty 10

## 2015-07-22 MED ORDER — SODIUM CHLORIDE 0.9% FLUSH: 3.0000 mL | INTRAVENOUS | Status: DC | PRN

## 2015-07-22 MED ORDER — BISOPROLOL FUMARATE 10 MG PO TABS
10.0000 mg | ORAL_TABLET | Freq: Every day | ORAL | Status: DC
  Administered 2015-07-22 – 2015-07-25 (×4): 10 mg via ORAL
  Filled 2015-07-22 (×4): qty 1

## 2015-07-22 NOTE — Consult Note (Signed)
[facility] Surgery Consult Note  Natalie [name] [birth year]  [id number].    Requesting MD: Dr. Kathrynn Humble Chief Complaint/Reason for Consult: abdominal pain/Post-operative changes on CT scan  HPI:  82 y/o white female with PMH significant for dementia, atrial fibrillation on anticoagulation with Pradaxa, HTN, depression, hypothyroidism who presented to [facility] for evaluation of ongoing shortness of breath, weakness, and reports of melanotic stool for last 1-2 weeks prior to this admission on 06/10/15.  Distal sigmoid mass identified on CT scan 11/2014 which was re-confirmed on CT abd on 06/13/15. CT chest 3/23 along with CT chest which showed an 8 mm right middle lobe nodule is new and suspicious for metastasis. Unchanged left renal mass compatible with renal cell carcinoma. Colonoscopy 06/13/15 shows near-obstructing mass in rectosigmoid - Dr. Cristina Gong.  Previous open cholecystectomy and appendectomy per medical record.  Workup showed obstruction of rectosigmoid colon by neoplasm likely carcinoma. CEA was found to be 11.4. Patient was admitted and eventually underwent the palliative Laparoscopic and open loop colostomy in the [facility] with Dr. Hassell Done on 06/18/15. Her cancer was unresectable and she required a palliative loop colostomy. Colonoscopy 06/13/15 pathology came back with adenocarcinoma. Her diverting colostomy was functioning well, bridge removed, edematous with evident slough, but no necrosis. The patient has dementia and is not fully aware of her cancer. On POD #8 (06/25/15), the patient was discharge to SNF. Patient was recently seen in Dr. Carlye Grippe office and was doing well.   She presented to [facility] from [facility] [place] today due to [place], aggitation, low grade temps (99.4) and chronic abdominal pain.  The daughter reported to the EDP that she had been having stool per rectum and per ostomy.   Pt had recent dental surgery and had some pain from that.  Pt denies chest pain,  shortness of breath, cough. She is demented and a bit aggressive and not able to contribute otherwise to the ROS.  No family at bedside currently.  ROS: All systems reviewed and otherwise negative except for as above  Family History  Problem Relation Age of Onset  . Prostate cancer Father     Patient states that his father probably had prostate cancer    Past Medical History  Diagnosis Date  . Osteoporosis   . Hypertension   . Hypothyroidism   . Lower GI bleeding 07/21/11  . Atrial fibrillation ([place])   . COPD (chronic obstructive pulmonary disease) ([place])   . Pneumonia   . Shortness of breath 07/21/11    "anytime"  . Sinus headache 07/21/11    "all the time"  . Bladder infection, chronic   . Arthritis     "everywhere"  . Anxiety   . Chronic lower back pain   . Chronic abdominal pain   . Renal mass, left 2013  . PUD (peptic ulcer disease)   . Chronic cystitis   . Trigeminal neuralgia     /E-chart  . Chronic kidney disease   . Anemia   . Respiratory failure, extubated 08/15/11 08/21/2011  . Normal left ventricular systolic function by 2D 4/74/25 08/21/2011  . Physical deconditioning   . Colon adenocarcinoma ([place])   . Thrombocytosis [facility])     Past Surgical History  Procedure Laterality Date  . Vaginal hysterectomy      partial  . Cataract extraction w/ intraocular lens  implant, bilateral    . Percutaneous pinning femoral neck fracture  11/2003    w/closed reduction ; left/E-chart  . Laceration repair  11/2003  left eyebrow  . Bilateral salpingoophorectomy  12/2005    lap/E-chart  . Cholecystectomy  1960  . Appendectomy      presumed/E-chart  . Esophagogastroduodenoscopy  07/23/2011    Procedure: ESOPHAGOGASTRODUODENOSCOPY (EGD);  Surgeon: Wonda Horner, MD;  Location: [facility] ENDOSCOPY;  Service: Endoscopy;  Laterality: N/A;  . Sigmoidoscopy  06/13/2015    Procedure: SIGMOIDOSCOPY;  Surgeon: Ronald Lobo, MD;  Location: [facility] ENDOSCOPY;  Service: Endoscopy;;  . Colectomy  with colostomy creation/hartmann procedure N/A 06/17/2015    Procedure: LAPAROSCOPY LOOP COLOSTOMY;  Surgeon: Johnathan Hausen, MD;  Location: [facility] ORS;  Service: General;  Laterality: N/A;    Social History:  reports that she quit smoking about 5 years ago. Her smoking use included Cigarettes. She smoked 1.00 pack per day. She has never used smokeless tobacco. She reports that she drinks about 1.2 oz of alcohol per week. She reports that she does not use illicit drugs.  Allergies:  Allergies  Allergen Reactions  . Cymbalta [Duloxetine Hcl] Swelling  . Levofloxacin Other (See Comments)    REACTION: unspecified per [facility]     (Not in a hospital admission)  Blood pressure 176/107, pulse 82, temperature 98.7 F (37.1 C), temperature source Rectal, resp. rate 16, SpO2 96 %. Physical Exam: General: pleasant, WD/WN white female who is laying in bed in NAD HEENT: head is normocephalic, atraumatic.  Sclera are noninjected.  PERRL.  Ears and nose without any masses or lesions.  Mouth is pink but dry. Heart: tachy, irregular rhythm.  No obvious murmurs, gallops, or rubs noted.  Palpable pedal pulses bilaterally Lungs: CTAB, no wheezes, rhonchi, or rales noted.  Respiratory effort nonlabored, good effort Abd: soft, minimal distension, mildly tender throughout, +BS, no masses, hernias, or organomegaly, scars well healed, ostomy pink without slough, flatus and stool in bag, peri-ostomy skin erosions. MS: all 4 extremities are symmetrical with no cyanosis, clubbing, or edema. Skin: warm and dry with no masses, lesions, or rashes, bruising to LE Psych: Alert, disoriented to place, time.  Pt is delirious and agitated.   Results for orders placed or performed during the hospital encounter of 07/21/15 (from the past 48 hour(s))  CBC with Differential/Platelet     Status: Abnormal   Collection Time: 07/22/15 12:26 AM  Result Value Ref Range   WBC 12.7 (H) 4.0 - 10.5 K/uL   RBC 3.58 (L) 3.87 - 5.11 MIL/uL    Hemoglobin 8.7 (L) 12.0 - 15.0 g/dL   HCT 27.2 (L) 36.0 - 46.0 %   MCV 76.0 (L) 78.0 - 100.0 fL   MCH 24.3 (L) 26.0 - 34.0 pg   MCHC 32.0 30.0 - 36.0 g/dL   RDW 16.6 (H) 11.5 - 15.5 %   Platelets 556 (H) 150 - 400 K/uL   Neutrophils Relative % 71 %   Neutro Abs 8.9 (H) 1.7 - 7.7 K/uL   Lymphocytes Relative 19 %   Lymphs Abs 2.4 0.7 - 4.0 K/uL   Monocytes Relative 9 %   Monocytes Absolute 1.2 (H) 0.1 - 1.0 K/uL   Eosinophils Relative 1 %   Eosinophils Absolute 0.1 0.0 - 0.7 K/uL   Basophils Relative 0 %   Basophils Absolute 0.0 0.0 - 0.1 K/uL  Basic metabolic panel     Status: Abnormal   Collection Time: 07/22/15 12:26 AM  Result Value Ref Range   Sodium 137 135 - 145 mmol/L   Potassium 3.3 (L) 3.5 - 5.1 mmol/L   Chloride 100 (L) 101 - 111 mmol/L  CO2 28 22 - 32 mmol/L   Glucose, Bld 113 (H) 65 - 99 mg/dL   BUN 10 6 - 20 mg/dL   Creatinine, Ser 0.56 0.44 - 1.00 mg/dL   Calcium 8.7 (L) 8.9 - 10.3 mg/dL   GFR calc non Af Amer >60 >60 mL/min   GFR calc Af Amer >60 >60 mL/min    Comment: (NOTE) The eGFR has been calculated using the CKD EPI equation. This calculation has not been validated in all clinical situations. eGFR's persistently <60 mL/min signify possible Chronic Kidney Disease.    Anion gap 9 5 - 15  Urinalysis, Routine w reflex microscopic (not at [facility])     Status: Abnormal   Collection Time: 07/22/15  1:22 AM  Result Value Ref Range   Color, Urine YELLOW YELLOW   APPearance CLOUDY (A) CLEAR   Specific Gravity, Urine 1.014 1.005 - 1.030   pH 5.5 5.0 - 8.0   Glucose, UA NEGATIVE NEGATIVE mg/dL   Hgb urine dipstick MODERATE (A) NEGATIVE   Bilirubin Urine NEGATIVE NEGATIVE   Ketones, ur NEGATIVE NEGATIVE mg/dL   Protein, ur NEGATIVE NEGATIVE mg/dL   Nitrite POSITIVE (A) NEGATIVE   Leukocytes, UA SMALL (A) NEGATIVE  Urine microscopic-add on     Status: Abnormal   Collection Time: 07/22/15  1:22 AM  Result Value Ref Range   Squamous Epithelial / LPF 0-5 (A)  NONE SEEN   WBC, UA 6-30 0 - 5 WBC/hpf   RBC / HPF 6-30 0 - 5 RBC/hpf   Bacteria, UA MANY (A) NONE SEEN   Ct Abdomen Pelvis Wo Contrast  07/22/2015  CLINICAL DATA:  Low grade fever and right lower quadrant pain. Recent surgery. Elevated white cell count. History of hypertension, colon cancer post colectomy and colostomy, chronic back pain and abdominal pain. Previous cholecystectomy and appendectomy. No IV access. EXAM: CT ABDOMEN AND PELVIS WITHOUT CONTRAST TECHNIQUE: Multidetector CT imaging of the abdomen and pelvis was performed following the standard protocol without IV contrast. COMPARISON:  06/13/2015 FINDINGS: Atelectasis in the lung bases with minimal pleural effusions. Coronary artery calcifications. Surgical absence of the gallbladder. No bile duct dilatation. The unenhanced appearance of the liver, spleen, pancreas, adrenal glands, inferior vena cava, and retroperitoneal lymph nodes is unremarkable. Prominent calcification in the abdominal aorta without aneurysm. No hydronephrosis or stone in either kidney. Right kidney demonstrates multiple exophytic lesions likely representing cysts. One of the lesions is hyperdense probably representing hemorrhagic cyst. One of the lesions in the lower pole contains calcification consistent with complex cyst. No change since prior study. On the left kidney, there is a solid-appearing lesion measuring about 2.2 cm diameter. No definite change in size since previous study. Small renal cell carcinoma or adenoma not excluded. The stomach, small bowel, and colon are not abnormally distended. Diverticulum at the second portion of the duodenum. No free air or free fluid in the abdomen. Pelvis: Interval postoperative changes with left lower quadrant descending colostomy. The sigmoid colon demonstrates prominent wall thickening involving the segments leading into and out out the colostomy. This could represent colitis of nonspecific etiology. The previous sigmoid colon  mass appears to have been removed in the interval. Bladder wall is not thickened. No free or loculated pelvic fluid collections. Appendix is not identified. Postoperative changes in the left hip. Tarlov cysts in the sacrum. Degenerative changes in the lumbar spine. No destructive bone lesions. IMPRESSION: Postoperative changes with left lower quadrant colostomy and resection of the sigmoid lesion seen previously. The sigmoid  colon entering and exiting the colostomy head extending down towards the rectum demonstrates diffuse wall thickening suggesting colitis of nonspecific etiology. This could represent infectious or inflammatory process or ischemic process. Solid mass on the left kidney appears unchanged since previous study. Cysts on the right kidney are also unchanged, including a calcified cyst and a hemorrhagic cyst. Small bilateral pleural effusions have improved since prior study. Electronically Signed   By: Lucienne Capers M.D.   On: 07/22/2015 06:11   Dg Chest 2 View  07/22/2015  CLINICAL DATA:  Intermittent fever.  Lower extremity swelling. EXAM: CHEST  2 VIEW COMPARISON:  06/10/2015 FINDINGS: There is unchanged mild left hemidiaphragm elevation. Mild linear scarring in the lateral left lung base. The lungs are otherwise clear. Hilar and mediastinal contours are unremarkable and unchanged. Pulmonary vasculature is normal. No effusions are evident. IMPRESSION: No active cardiopulmonary disease. Electronically Signed   By: Andreas Newport M.D.   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 Hassell Done 06/17/15 -CT scan 07/21/15 shows normal post-op changes, diffuse all thickening of sigmoid to rectum likely from cancer vs colitis -C.diff pending, may need GI consult for colitis -I see a  note from Mrs. [place] cancer [employer] cabin [employer].  The daughter has been indecisive regarding follow up appointments with the cancer center and treatment or not. -Clear liquids ok, IS, pain control, mobilize -Functioning ostomy, ordered WOC consult for skin concerns around ostomy/pouch -Because she has a diverting LOOP colostomy not unusual to have stool per rectum  Re-hospitalized 07/22/15 for AMS, agitation, UTI AFIB on Elliquis - It is best to hold for now given bleed from the tumor  Alzheimer's dementia/organic brain dysfunction Frequent UTI's HTN Anxiety COPD Encephalopathy VTE prophylaxis-per [place], [facility] Surgery 07/22/2015, 8:02 AM Pager: [phone] (7am - 4:30pm M-F; 7am - 11:30am Sa/Su)

## 2015-07-22 NOTE — H&P (Signed)
History and Physical    Natalie [name] [id number] DOB: 08/06/1930 DOA: 07/21/2015  Referring MD/NP/PA:  PCP: Marton Redwood, MD  Outpatient Specialists:  Patient coming from: [place]  Chief Complaint: Fever  HPI: Natalie [name] is a 82 y.o. female with medical history significant of advanced dementia, current resident at skilled nursing facility, history of atrial fibrillation, was recently diagnosed with obstructing mass in rectosigmoid on a colonoscopy from 03/23/2017with pathology reporting adenocarcinoma, undergoing open loop colostomy in the left upper quadrant and sigmoid lesion resection on 06/18/2015.  She was discharged to [facility] on 06/25/2015 in stable condition. She was referred to the emergency department overnight as she presented with low-grade fever, mental status changes, agitation, failure to thrive. She has advanced dementia and is unable to provide history. History was obtained from emergency room staff.  ED Course: Workup in the emergency department included a CT scan of abdomen and pelvis which revealed postoperative changes within left lower quadrant colostomy and resection of sigmoid lesion. Radiology reported findings suggestive of colitis in sigmoid colon. Urinalysis showed the presence of many bacteria, nitrates and leukocyte esterase. She was started on ceftriaxone and Flagyl.  Review of Systems: Unable to obtain reliable review of systems patient with advanced dementia  Past Medical History  Diagnosis Date  . Osteoporosis   . Hypertension   . Hypothyroidism   . Lower GI bleeding 07/21/11  . Atrial fibrillation ([place])   . COPD (chronic obstructive pulmonary disease) ([place])   . Pneumonia   . Shortness of breath 07/21/11    "anytime"  . Sinus headache 07/21/11    "all the time"  . Bladder infection, chronic   . Arthritis     "everywhere"  . Anxiety   . Chronic lower back pain   . Chronic abdominal pain   . Renal mass, left 2013  . PUD  (peptic ulcer disease)   . Chronic cystitis   . Trigeminal neuralgia     /E-chart  . Chronic kidney disease   . Anemia   . Respiratory failure, extubated 08/15/11 08/21/2011  . Normal left ventricular systolic function by 2D [id number] 08/21/2011  . Physical deconditioning   . Colon adenocarcinoma ([place])   . Thrombocytosis [facility])     Past Surgical History  Procedure Laterality Date  . Vaginal hysterectomy      partial  . Cataract extraction w/ intraocular lens  implant, bilateral    . Percutaneous pinning femoral neck fracture  11/2003    w/closed reduction ; left/E-chart  . Laceration repair  11/2003    left eyebrow  . Bilateral salpingoophorectomy  12/2005    lap/E-chart  . Cholecystectomy  1960  . Appendectomy      presumed/E-chart  . Esophagogastroduodenoscopy  07/23/2011    Procedure: ESOPHAGOGASTRODUODENOSCOPY (EGD);  Surgeon: Wonda Horner, MD;  Location: [facility] ENDOSCOPY;  Service: Endoscopy;  Laterality: N/A;  . Sigmoidoscopy  06/13/2015    Procedure: SIGMOIDOSCOPY;  Surgeon: Ronald Lobo, MD;  Location: [facility] ENDOSCOPY;  Service: Endoscopy;;  . Colectomy with colostomy creation/hartmann procedure N/A 06/17/2015    Procedure: LAPAROSCOPY LOOP COLOSTOMY;  Surgeon: Johnathan Hausen, MD;  Location: [facility] ORS;  Service: General;  Laterality: N/A;     reports that she quit smoking about 5 years ago. Her smoking use included Cigarettes. She smoked 1.00 pack per day. She has never used smokeless tobacco. She reports that she drinks about 1.2 oz of alcohol per week. She reports that she does not use illicit drugs.  Allergies  Allergen Reactions  . Cymbalta [Duloxetine Hcl] Swelling  . Levofloxacin Other (See Comments)    REACTION: unspecified per [facility]    Family History  Problem Relation Age of Onset  . Prostate cancer Father     Patient states that his father probably had prostate cancer    Prior to Admission medications   Medication Sig Start Date End Date Taking? Authorizing Provider    acetaminophen (TYLENOL) 500 MG tablet Take 500 mg by mouth every 8 (eight) hours as needed for mild pain.   Yes Historical Provider, MD  ALPRAZolam Duanne Moron) 0.25 MG tablet Take one tablet by mouth every 6 hours as needed for anxiety 07/11/15  Yes Tiffany L Reed, DO  bisoprolol (ZEBETA) 10 MG tablet Take 10 mg by mouth daily. Hold if HR is less than 60 or SBP is less then 110.   Yes Historical Provider, MD  cloNIDine (CATAPRES) 0.1 MG tablet Take 0.1 mg by mouth daily. Reported on 06/10/2015   Yes Historical Provider, MD  docusate sodium (COLACE) 100 MG capsule Take 100 mg by mouth 2 (two) times daily.   Yes Historical Provider, MD  ferrous sulfate 325 (65 FE) MG tablet Take 325 mg by mouth 2 (two) times daily with a meal.   Yes Historical Provider, MD  HYDROcodone-acetaminophen (NORCO/VICODIN) 5-325 MG tablet Take 1 tablet by mouth 2 (two) times daily as needed for severe pain.   Yes Historical Provider, MD  imipramine (TOFRANIL) 10 MG tablet Take 15 mg by mouth at bedtime. Take 1-1/2 tabs to = 15 mg po QHS 07/11/14  Yes Historical Provider, MD  lisinopril (PRINIVIL,ZESTRIL) 40 MG tablet Take 40 mg by mouth daily.   Yes Historical Provider, MD  methadone (DOLOPHINE) 5 MG tablet Take 2.5 mg by mouth. Take 1/2 tablet to = 2.5 mg po at 8AM   Yes Historical Provider, MD  omeprazole (PRILOSEC) 20 MG capsule Take 20 mg by mouth daily. Give at 12PM   Yes Historical Provider, MD  polyethylene glycol (MIRALAX / GLYCOLAX) packet Take 17 g by mouth 2 (two) times daily as needed for mild constipation.    Yes Historical Provider, MD  Protein (PROCEL) POWD Take 2 scoop by mouth 2 (two) times daily.   Yes Historical Provider, MD  simethicone (MYLICON) 80 MG chewable tablet Chew 160 mg by mouth every 6 (six) hours as needed for flatulence.   Yes Historical Provider, MD  spironolactone (ALDACTONE) 25 MG tablet Take 1 tablet (25 mg total) by mouth 2 (two) times daily. 12/03/14  Yes Oswald Hillock, MD  traMADol (ULTRAM) 50  MG tablet Take 0.5 tablets (25 mg total) by mouth 2 (two) times daily as needed for moderate pain. Take 1/2 tablet to = 25 mg po BID PRN pain 07/16/15  Yes Estill Dooms, MD    Physical Exam: Filed Vitals:   07/22/15 0800 07/22/15 0830 07/22/15 0839 07/22/15 0900  BP: 160/104 170/119 166/98 157/109  Pulse:  107 106 111  Temp:    98.1 F (36.7 C)  TempSrc:    Oral  Resp:    18  Height:    5\' 3"  (1.6 m)  Weight:    57.2 kg (126 lb 1.7 oz)  SpO2:  98% 96% 90%    Constitutional: Patient with advanced dementia, having agitation Filed Vitals:   07/22/15 0800 07/22/15 0830 07/22/15 0839 07/22/15 0900  BP: 160/104 170/119 166/98 157/109  Pulse:  107 106 111  Temp:    98.1 F (36.7 C)  TempSrc:    Oral  Resp:    18  Height:    5\' 3"  (1.6 m)  Weight:    57.2 kg (126 lb 1.7 oz)  SpO2:  98% 96% 90%   Eyes: PERRL, lids and conjunctivae normal ENMT: Mucous membranes are moist. Posterior pharynx clear of any exudate or lesions.Normal dentition.  Neck: normal, supple, no masses, no thyromegaly Respiratory: clear to auscultation bilaterally, no wheezing, no crackles. Normal respiratory effort. No accessory muscle use.  Cardiovascular: Regular rate and rhythm, no murmurs / rubs / gallops. No extremity edema. 2+ pedal pulses. No carotid bruits.  Abdomen: Her abdomen appear to be nontender that were positive bowel sounds, previous surgical incision sites appear clean with evidence of infection. Status post colostomy which appears to be functioning well. Musculoskeletal: no clubbing / cyanosis. No joint deformity upper and lower extremities. Good ROM, no contractures. Normal muscle tone.  Skin: no rashes, lesions, ulcers. No induration Neurologic: Appears nonfocal, difficult to perform neurologic examination given her agitation and advanced dementia Psychiatric: She is confused, disoriented, agitated, poor historian, seems very suspicious of healthcare providers    Labs on Admission: I have  personally reviewed following labs and imaging studies  CBC:  Recent Labs Lab 07/22/15 0026  WBC 12.7*  NEUTROABS 8.9*  HGB 8.7*  HCT 27.2*  MCV 76.0*  PLT [id number]*   Basic Metabolic Panel:  Recent Labs Lab 07/22/15 0026  NA 137  K 3.3*  CL 100*  CO2 28  GLUCOSE 113*  BUN 10  CREATININE 0.56  CALCIUM 8.7*   GFR: Estimated Creatinine Clearance: 44.8 mL/min (by C-G formula based on Cr of 0.56). Liver Function Tests: No results for input(s): AST, ALT, ALKPHOS, BILITOT, PROT, ALBUMIN in the last 168 hours. No results for input(s): LIPASE, AMYLASE in the last 168 hours. No results for input(s): AMMONIA in the last 168 hours. Coagulation Profile: No results for input(s): INR, PROTIME in the last 168 hours. Cardiac Enzymes: No results for input(s): CKTOTAL, CKMB, CKMBINDEX, TROPONINI in the last 168 hours. BNP (last 3 results) No results for input(s): PROBNP in the last 8760 hours. HbA1C: No results for input(s): HGBA1C in the last 72 hours. CBG: No results for input(s): GLUCAP in the last 168 hours. Lipid Profile: No results for input(s): CHOL, HDL, LDLCALC, TRIG, CHOLHDL, LDLDIRECT in the last 72 hours. Thyroid Function Tests: No results for input(s): TSH, T4TOTAL, FREET4, T3FREE, THYROIDAB in the last 72 hours. Anemia Panel: No results for input(s): VITAMINB12, FOLATE, FERRITIN, TIBC, IRON, RETICCTPCT in the last 72 hours. Urine analysis:    Component Value Date/Time   COLORURINE YELLOW 07/22/2015 0122   APPEARANCEUR CLOUDY* 07/22/2015 0122   LABSPEC 1.014 07/22/2015 0122   PHURINE 5.5 07/22/2015 0122   GLUCOSEU NEGATIVE 07/22/2015 0122   HGBUR MODERATE* 07/22/2015 0122   BILIRUBINUR NEGATIVE 07/22/2015 0122   KETONESUR NEGATIVE 07/22/2015 0122   PROTEINUR NEGATIVE 07/22/2015 0122   UROBILINOGEN 0.2 12/01/2014 0340   NITRITE POSITIVE* 07/22/2015 0122   LEUKOCYTESUR SMALL* 07/22/2015 0122   Sepsis Labs: @LABRCNTIP (procalcitonin:4,lacticidven:4) )No results  found for this or any previous visit (from the past 240 hour(s)).   Radiological Exams on Admission: Ct Abdomen Pelvis Wo Contrast  07/22/2015  CLINICAL DATA:  Low grade fever and right lower quadrant pain. Recent surgery. Elevated white cell count. History of hypertension, colon cancer post colectomy and colostomy, chronic back pain and abdominal pain. Previous cholecystectomy and appendectomy. No IV access. EXAM: CT ABDOMEN AND PELVIS WITHOUT CONTRAST TECHNIQUE: Multidetector  CT imaging of the abdomen and pelvis was performed following the standard protocol without IV contrast. COMPARISON:  06/13/2015 FINDINGS: Atelectasis in the lung bases with minimal pleural effusions. Coronary artery calcifications. Surgical absence of the gallbladder. No bile duct dilatation. The unenhanced appearance of the liver, spleen, pancreas, adrenal glands, inferior vena cava, and retroperitoneal lymph nodes is unremarkable. Prominent calcification in the abdominal aorta without aneurysm. No hydronephrosis or stone in either kidney. Right kidney demonstrates multiple exophytic lesions likely representing cysts. One of the lesions is hyperdense probably representing hemorrhagic cyst. One of the lesions in the lower pole contains calcification consistent with complex cyst. No change since prior study. On the left kidney, there is a solid-appearing lesion measuring about 2.2 cm diameter. No definite change in size since previous study. Small renal cell carcinoma or adenoma not excluded. The stomach, small bowel, and colon are not abnormally distended. Diverticulum at the second portion of the duodenum. No free air or free fluid in the abdomen. Pelvis: Interval postoperative changes with left lower quadrant descending colostomy. The sigmoid colon demonstrates prominent wall thickening involving the segments leading into and out out the colostomy. This could represent colitis of nonspecific etiology. The previous sigmoid colon mass  appears to have been removed in the interval. Bladder wall is not thickened. No free or loculated pelvic fluid collections. Appendix is not identified. Postoperative changes in the left hip. Tarlov cysts in the sacrum. Degenerative changes in the lumbar spine. No destructive bone lesions. IMPRESSION: Postoperative changes with left lower quadrant colostomy and resection of the sigmoid lesion seen previously. The sigmoid colon entering and exiting the colostomy head extending down towards the rectum demonstrates diffuse wall thickening suggesting colitis of nonspecific etiology. This could represent infectious or inflammatory process or ischemic process. Solid mass on the left kidney appears unchanged since previous study. Cysts on the right kidney are also unchanged, including a calcified cyst and a hemorrhagic cyst. Small bilateral pleural effusions have improved since prior study. Electronically Signed   By: Lucienne Capers M.D.   On: 07/22/2015 06:11   Dg Chest 2 View  07/22/2015  CLINICAL DATA:  Intermittent fever.  Lower extremity swelling. EXAM: CHEST  2 VIEW COMPARISON:  06/10/2015 FINDINGS: There is unchanged mild left hemidiaphragm elevation. Mild linear scarring in the lateral left lung base. The lungs are otherwise clear. Hilar and mediastinal contours are unremarkable and unchanged. Pulmonary vasculature is normal. No effusions are evident. IMPRESSION: No active cardiopulmonary disease. Electronically Signed   By: Andreas Newport M.D.   On: 07/22/2015 01:02    EKG: Independently reviewed.   Assessment/Plan Principal Problem:   UTI (lower urinary tract infection) Active Problems:   Colitis, acute   Hypothyroidism   Essential hypertension   COPD (chronic obstructive pulmonary disease) (HCC)   Adenocarcinoma of sigmoid colon ([place])  1.  Acute encephalopathy. Mrs Chaflin having a history of dementia, presented as a transfer from her skilled nursing facility for increased agitation. In the  emergency department she exhibited paranoia towards staff members. I think that acute encephalopathy likely resulting from infectious process. Her urine was positive for UTI. CT scan showed findings consistent with colitis. She was given IV fluids in the emergency department and started on IV antibiotic therapy. Provide supportive care.   2.  Urinary tract infection. She was found to have low-grade temperatures at her skilled nursing facility, presenting with acute encephalopathy and with urinalysis showing presence of urinary tract infection. Will provide empiric IV antimicrobial therapy with ceftriaxone. Follow-up  on cultures.  3.  Colitis. A CT scan in the emergency room showing findings suggestive of colitis in the sigmoid. Unclear if this is related to her previous surgery or an underlying infectious process. She'll be covered with IV ceftriaxone and Flagyl. Awaiting gastrointestinal panel.   4.  Adenocarcinoma sigmoid colon. She was recently diagnosed with adenocarcinoma of the sigmoid colon, undergoing laparoscopic diverting loop sigmoid colostomy. On examination colostomy bag appears to be functioning. She was seen and evaluated by general surgery.   5.  Hypertension. She is on multiple antihypertensives; ACE inhibitor therapy, beta blocker and clonidine, having elevated blood pressures in the emergency department which could be a reflection of her agitation. Continue lisinopril 40 mg by mouth daily, bisoprolol 10 mg daily and clonidine 0.1 mg daily. Monitor blood pressures, may need titration of antihypertensive agents.  6.  Chronic pain syndrome, narcotic dependent. She is on low-dose methadone 2.5 mg daily along with Norco as needed for breakthrough pain.   DVT prophylaxis: Lovenox Code Status: Full code Family Communication: Family not present Disposition Plan: This patient may require greater than 2 nights hospitalization Consults called: General surgery Admission status:  Inpatient   Kelvin Cellar MD [facility] Pager [phone]  If 7PM-7AM, please contact night-coverage [email or web address] Password [email or web address]  07/22/2015, 10:27 AM

## 2015-07-22 NOTE — Consult Note (Signed)
WOC ostomy consult note Stoma type/location: LLQ colostomy (loop) Stomal assessment/size: 1 and 1/8 inch round in a circumferential gulley. Peristomal assessment: intact with peristomal erythema circumferentially due to aperture in pouching system being cut too large.  Additionally, patient is in a 2-piece flat 4-inch pouching system, with is too large for her frame and stoma size while also a poor match for her peristomal skin contours. Treatment options for stomal/peristomal skin: resizing stoma and cutting aperture to fit. Nursing staff provided guidance via the orders to empty pouch every two hours of flatus or effluent. Output thin brown stool Ostomy pouching: 1pc.pouching system with convexity (1 and 1/5 inches) with skin barrier ring Education provided: None today Enrolled patient in [place] program: No WOC nursing team will not follow closely, but will remain available to this patient, the nursing and medical teams.  Please re-consult if needed in between visits. Supplies at bedside:  Pouch, skin barrier ring and pattern. Thanks, Maudie Flakes, MSN, RN, [place], Arther Abbott  Pager# [phone]

## 2015-07-22 NOTE — ED Notes (Signed)
Pt had BM via rectum that was same consistency as that of stool coming from colostomy

## 2015-07-22 NOTE — Progress Notes (Signed)
[facility] - [id number]  [facility] RN Note   Patient record is currently under review for eligibility based on hospice referral received 07/16/15 from Dr. Marton Redwood.   Prior to this hospital admission, t he patient was scheduled for admission with [facility] on 07/23/15.    Spoke with daughter Vergia Alcon to discuss her wishes.   Appt scheduled to meet with her tomorrow at 10 a.m. To discuss Hospice Services.  Patient has previously been followed by [facility] while residing at [facility].  Plan was to admit patient at her home.  Daughter uncertain at this time.   RNCM Juliann Pulse made aware.    Please call with any questions or concerns.    Mickie Kay, [facility]  [phone]

## 2015-07-22 NOTE — Progress Notes (Signed)
VASCULAR LAB PRELIMINARY  PRELIMINARY  PRELIMINARY  PRELIMINARY  Left lower extremity venous duplex completed.     Left:  No evidence of DVT, superficial thrombosis, or Baker's cyst.   Janifer Adie, RVT, RDMS 07/22/2015, 9:52 AM

## 2015-07-23 DIAGNOSIS — G9340 Encephalopathy, unspecified: Secondary | ICD-10-CM

## 2015-07-23 LAB — BASIC METABOLIC PANEL
Anion gap: 9 (ref 5–15)
BUN: 5 mg/dL — AB (ref 6–20)
CHLORIDE: 103 mmol/L (ref 101–111)
CO2: 27 mmol/L (ref 22–32)
Calcium: 8.4 mg/dL — ABNORMAL LOW (ref 8.9–10.3)
Creatinine, Ser: 0.62 mg/dL (ref 0.44–1.00)
Glucose, Bld: 99 mg/dL (ref 65–99)
POTASSIUM: 3.2 mmol/L — AB (ref 3.5–5.1)
Sodium: 139 mmol/L (ref 135–145)

## 2015-07-23 LAB — URINE CULTURE

## 2015-07-23 LAB — CBC
HEMATOCRIT: 27.2 % — AB (ref 36.0–46.0)
Hemoglobin: 8.6 g/dL — ABNORMAL LOW (ref 12.0–15.0)
MCH: 24 pg — ABNORMAL LOW (ref 26.0–34.0)
MCHC: 31.6 g/dL (ref 30.0–36.0)
MCV: 76 fL — ABNORMAL LOW (ref 78.0–100.0)
PLATELETS: 547 10*3/uL — AB (ref 150–400)
RBC: 3.58 MIL/uL — AB (ref 3.87–5.11)
RDW: 16.8 % — AB (ref 11.5–15.5)
WBC: 9 10*3/uL (ref 4.0–10.5)

## 2015-07-23 MED ORDER — POTASSIUM CHLORIDE 20 MEQ PO PACK
40.0000 meq | PACK | Freq: Once | ORAL | Status: DC
  Filled 2015-07-23: qty 2

## 2015-07-23 MED ORDER — POTASSIUM CHLORIDE CRYS ER 20 MEQ PO TBCR
40.0000 meq | EXTENDED_RELEASE_TABLET | Freq: Once | ORAL | Status: AC
  Administered 2015-07-23: 40 meq via ORAL
  Filled 2015-07-23: qty 2

## 2015-07-23 MED ORDER — SALINE SPRAY 0.65 % NA SOLN
1.0000 | NASAL | Status: DC | PRN
  Filled 2015-07-23: qty 44

## 2015-07-23 NOTE — Consult Note (Signed)
Subjective:   HPI  The patient is a 82 year old female who we are asked to see in consultation by surgery because of findings of colitis on the CT scan. The patient was diagnosed on March 23 with adenocarcinoma of the rectosigmoid area did this was causing obstruction. On March 27 she underwent a diverting loop sigmoid colostomy. The tumor was not removed. She had a CT scan yesterday and in reviewing the CT scan it shows evidence of colon wall thickening in the distal colostomy and in reviewing it with the radiologist today the tumor is still obviously present. It appears on CT scan that she has a double barrel colostomy the affected area of wall thickening looks to be in the distal colostomy just above the site of the tumor. In reviewing this with Dr. Clovis Riley from radiology he thinks this looks ischemic. The other portion of the colostomy draining the main bowel and colon appears normal. Stool for enteric pathogens was negative.  Review of Systems Patient has no complaints of bleeding through her colostomy  Past Medical History  Diagnosis Date  . Osteoporosis   . Hypertension   . Hypothyroidism   . Lower GI bleeding 07/21/11  . Atrial fibrillation ([place])   . COPD (chronic obstructive pulmonary disease) ([place])   . Pneumonia   . Shortness of breath 07/21/11    "anytime"  . Sinus headache 07/21/11    "all the time"  . Bladder infection, chronic   . Arthritis     "everywhere"  . Anxiety   . Chronic lower back pain   . Chronic abdominal pain   . Renal mass, left 2013  . PUD (peptic ulcer disease)   . Chronic cystitis   . Trigeminal neuralgia     /E-chart  . Chronic kidney disease   . Anemia   . Respiratory failure, extubated 08/15/11 08/21/2011  . Normal left ventricular systolic function by 2D [id number] 08/21/2011  . Physical deconditioning   . Colon adenocarcinoma ([place])   . Thrombocytosis [facility])    Past Surgical History  Procedure Laterality Date  . Vaginal hysterectomy      partial  .  Cataract extraction w/ intraocular lens  implant, bilateral    . Percutaneous pinning femoral neck fracture  11/2003    w/closed reduction ; left/E-chart  . Laceration repair  11/2003    left eyebrow  . Bilateral salpingoophorectomy  12/2005    lap/E-chart  . Cholecystectomy  1960  . Appendectomy      presumed/E-chart  . Esophagogastroduodenoscopy  07/23/2011    Procedure: ESOPHAGOGASTRODUODENOSCOPY (EGD);  Surgeon: Wonda Horner, MD;  Location: [facility] ENDOSCOPY;  Service: Endoscopy;  Laterality: N/A;  . Sigmoidoscopy  06/13/2015    Procedure: SIGMOIDOSCOPY;  Surgeon: Ronald Lobo, MD;  Location: [facility] ENDOSCOPY;  Service: Endoscopy;;  . Colectomy with colostomy creation/hartmann procedure N/A 06/17/2015    Procedure: LAPAROSCOPY LOOP COLOSTOMY;  Surgeon: Johnathan Hausen, MD;  Location: [facility] ORS;  Service: General;  Laterality: N/A;   Social History   Social History  . Marital Status: Widowed    Spouse Name: N/A  . Number of Children: N/A  . Years of Education: N/A   Occupational History  . Not on file.   Social History Main Topics  . Smoking status: Former Smoker -- 1.00 packs/day    Types: Cigarettes    Quit date: 09/11/2009  . Smokeless tobacco: Never Used     Comment: 07/21/11 "can't remember when I quit smoking"  . Alcohol Use: 1.2 oz/week  2 Glasses of wine per week     Comment: 07/21/11 "seldom drink"  . Drug Use: No  . Sexual Activity: No   Other Topics Concern  . Not on file   Social History Narrative   family history includes Prostate cancer in her father.  Current facility-administered medications:  .  0.9 %  sodium chloride infusion, 250 mL, Intravenous, PRN, Kelvin Cellar, MD .  ALPRAZolam Duanne Moron) tablet 0.25 mg, 0.25 mg, Oral, TID PRN, Kelvin Cellar, MD, 0.25 mg at 07/23/15 1356 .  bisoprolol (ZEBETA) tablet 10 mg, 10 mg, Oral, Daily, Kelvin Cellar, MD, 10 mg at 07/23/15 1042 .  cefTRIAXone (ROCEPHIN) 1 g in dextrose 5 % 50 mL IVPB, 1 g, Intravenous, Q24H,  Kelvin Cellar, MD, 1 g at 07/22/15 2233 .  cloNIDine (CATAPRES) tablet 0.1 mg, 0.1 mg, Oral, Daily, Kelvin Cellar, MD, 0.1 mg at 07/23/15 1042 .  enoxaparin (LOVENOX) injection 40 mg, 40 mg, Subcutaneous, Daily, Kelvin Cellar, MD, 40 mg at 07/23/15 1058 .  fluconazole (DIFLUCAN) IVPB 100 mg, 100 mg, Intravenous, Q24H, Kelvin Cellar, MD, 100 mg at 07/23/15 1615 .  HYDROcodone-acetaminophen (NORCO/VICODIN) 5-325 MG per tablet 1 tablet, 1 tablet, Oral, BID PRN, Kelvin Cellar, MD, 1 tablet at 07/23/15 1337 .  imipramine (TOFRANIL) tablet 15 mg, 15 mg, Oral, QHS, Kelvin Cellar, MD, 15 mg at 07/22/15 2117 .  labetalol (NORMODYNE,TRANDATE) injection 10 mg, 10 mg, Intravenous, Q2H PRN, Kelvin Cellar, MD .  lisinopril (PRINIVIL,ZESTRIL) tablet 40 mg, 40 mg, Oral, Daily, Kelvin Cellar, MD, 40 mg at 07/23/15 1042 .  methadone (DOLOPHINE) tablet 2.5 mg, 2.5 mg, Oral, Q breakfast, Kelvin Cellar, MD, 2.5 mg at 07/23/15 [id number] .  metroNIDAZOLE (FLAGYL) IVPB 500 mg, 500 mg, Intravenous, Q8H, Kelvin Cellar, MD, 500 mg at 07/23/15 1129 .  ondansetron (ZOFRAN) tablet 4 mg, 4 mg, Oral, Q6H PRN **OR** ondansetron (ZOFRAN) injection 4 mg, 4 mg, Intravenous, Q6H PRN, Kelvin Cellar, MD .  sodium chloride (OCEAN) 0.65 % nasal spray 1 spray, 1 spray, Each Nare, PRN, Samuella Cota, MD .  sodium chloride flush (NS) 0.9 % injection 3 mL, 3 mL, Intravenous, Q12H, Kelvin Cellar, MD, 10 mL at 07/23/15 1129 .  sodium chloride flush (NS) 0.9 % injection 3 mL, 3 mL, Intravenous, PRN, Kelvin Cellar, MD Allergies  Allergen Reactions  . Cymbalta [Duloxetine Hcl] Swelling  . Levofloxacin Other (See Comments)    REACTION: unspecified per MAR     Objective:     BP 139/85 mmHg  Pulse 87  Temp(Src) 98.6 F (37 C) (Oral)  Resp 16  Ht 5\' 3"  (1.6 m)  Wt 57.2 kg (126 lb 1.7 oz)  BMI 22.34 kg/m2  SpO2 97%  No distress  Heart regular rhythm  Lungs clear  Abdomen soft and nontender  Inspection of  colostomy area looks fine. No blood in the colostomy bag.  Laboratory No components found for: D1    Assessment:     Adenocarcinoma of the rectosigmoid  Double-barreled colostomy which on review of CT scan shows evidence of wall thickening above the rectosigmoid tumor in the distal barrel of the colostomy. This is probably ischemic. The proximal barrel of the colostomy draining the main portion of the colon appears to be spared.      Plan:     I think that medical management and supportive care is the only option at this point.

## 2015-07-23 NOTE — Clinical Social Work Placement (Signed)
   CLINICAL SOCIAL WORK PLACEMENT  NOTE  Date:  07/23/2015  Patient Details  Name: Natalie [name] MRN: [id number] Date of Birth: [birth year]  Clinical Social Work is seeking post-discharge placement for this patient at the [place] level of care (*CSW will initial, date and re-position this form in  chart as items are completed):  No   Patient/family provided with [place] Work Department's list of facilities offering this level of care within the geographic area requested by the patient (or if unable, by the patient's family).  Yes   Patient/family informed of their freedom to choose among providers that offer the needed level of care, that participate in Medicare, Medicaid or managed care program needed by the patient, have an available bed and are willing to accept the patient.  Yes   Patient/family informed of [facility]'s ownership interest in [facility] and [facility], as well as of the fact that they are under no obligation to receive care at these facilities.  PASRR submitted to EDS on       PASRR number received on       Existing PASRR number confirmed on 07/23/15     FL2 transmitted to all facilities in geographic area requested by pt/family on 07/23/15     FL2 transmitted to all facilities within larger geographic area on       Patient informed that his/her managed care company has contracts with or will negotiate with certain facilities, including the following:            Patient/family informed of bed offers received.  Patient chooses bed at       Physician recommends and patient chooses bed at      Patient to be transferred to   on  .  Patient to be transferred to facility by       Patient family notified on   of transfer.  Name of family member notified:        PHYSICIAN       Additional Comment:    _______________________________________________ Luretha Rued, [place] 07/23/2015, 4:46 PM

## 2015-07-23 NOTE — NC FL2 (Signed)
[place] LEVEL OF CARE SCREENING TOOL     IDENTIFICATION  Patient Name: Natalie [name] Birthdate: [birth year] Sex: female Admission Date (Current Location): 07/21/2015  [facility] and [place] Number:  Herbalist and Address:  [facility],  [address] [place], [place]      Provider Number: [id number]  Attending Physician Name and Address:  Samuella Cota, MD  Relative Name and Phone Number:       Current Level of Care: Hospital Recommended Level of Care: [place] Prior Approval Number:    Date Approved/Denied:   PASRR Number: [id number]  Discharge Plan: SNF    Current Diagnoses: Patient Active Problem List   Diagnosis Date Noted  . UTI (lower urinary tract infection) 07/22/2015  . Colitis, acute 07/22/2015  . Protein-calorie malnutrition, severe 06/21/2015  . 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 Adenocarcinoma of sigmoid colon ([place]) 06/16/2015  . Encephalopathy, metabolic [id number]  . Dementia in Alzheimer's disease 06/12/2015  . Acute upper GI bleed 06/10/2015  . Diverticulitis of colon 12/01/2014  . UTI (urinary tract infection) 12/01/2014  . Elevated INR 11/30/2014  . Peripheral venous insufficiency 11/26/2012  . Swelling of limb 10/31/2012  . COPD (chronic obstructive pulmonary disease) ([place]) 08/31/2011  . Urinary retention 08/28/2011  . History of gastric ulcer 07/24/2011  . Renal mass, left 07/21/2011  . Chronic back pain 07/21/2011  . Atrial fibrillation, persistent with asymptomatic pauses 07/21/2011  . Warfarin anticoagulation 07/21/2011  . Anxiety state 08/08/2007  . Depression 08/08/2007  . CHRONIC HEADACHES 08/08/2007  . Hypothyroidism 01/24/2007  . Essential hypertension 01/24/2007  . Allergic rhinitis 01/24/2007    Orientation RESPIRATION BLADDER Height & Weight     Self, Situation, Place  Normal Incontinent Weight: 57.2 kg (126 lb 1.7 oz) Height:   5\' 3"  (160 cm)  BEHAVIORAL SYMPTOMS/MOOD NEUROLOGICAL BOWEL NUTRITION STATUS  Other (Comment) (no behaviors)   Colostomy Diet  AMBULATORY STATUS COMMUNICATION OF NEEDS Skin   Extensive Assist Verbally Normal                       Personal Care Assistance Level of Assistance  Bathing, Feeding, Dressing Bathing Assistance: Maximum assistance Feeding assistance: Independent Dressing Assistance: Maximum assistance     Functional Limitations Info  Sight, Hearing, Speech Sight Info: Impaired Hearing Info: Impaired Speech Info: Adequate    SPECIAL CARE FACTORS FREQUENCY  PT (By licensed PT), OT (By licensed OT)     PT Frequency: 5 x wk OT Frequency: 5 x wk            Contractures Contractures Info: Not present    Additional Factors Info  Code Status, Isolation Precautions Code Status Info: Full Code       Isolation Precautions Info: Enteric Precautions     Current Medications (07/23/2015):  This is the current hospital active medication list Current Facility-Administered Medications  Medication Dose Route Frequency Provider Last Rate Last Dose  . 0.9 %  sodium chloride infusion  250 mL Intravenous PRN Kelvin Cellar, MD      . ALPRAZolam Duanne Moron) tablet 0.25 mg  0.25 mg Oral TID PRN Kelvin Cellar, MD   0.25 mg at 07/23/15 1356  . bisoprolol (ZEBETA) tablet 10 mg  10 mg Oral Daily Kelvin Cellar, MD   10 mg at 07/23/15 1042  . cefTRIAXone (ROCEPHIN) 1 g in dextrose 5 % 50 mL IVPB  1 g Intravenous Q24H Kelvin Cellar, MD  1 g at 07/22/15 2233  . cloNIDine (CATAPRES) tablet 0.1 mg  0.1 mg Oral Daily Kelvin Cellar, MD   0.1 mg at 07/23/15 1042  . enoxaparin (LOVENOX) injection 40 mg  40 mg Subcutaneous Daily Kelvin Cellar, MD   40 mg at 07/23/15 1058  . fluconazole (DIFLUCAN) IVPB 100 mg  100 mg Intravenous Q24H Kelvin Cellar, MD   100 mg at 07/22/15 1714  . HYDROcodone-acetaminophen (NORCO/VICODIN) 5-325 MG per tablet 1 tablet  1 tablet Oral BID PRN Kelvin Cellar, MD   1 tablet at 07/23/15 1337  . imipramine (TOFRANIL) tablet 15 mg  15 mg Oral QHS Kelvin Cellar, MD   15 mg at 07/22/15 2117  . labetalol (NORMODYNE,TRANDATE) injection 10 mg  10 mg Intravenous Q2H PRN Kelvin Cellar, MD      . lisinopril (PRINIVIL,ZESTRIL) tablet 40 mg  40 mg Oral Daily Kelvin Cellar, MD   40 mg at 07/23/15 1042  . methadone (DOLOPHINE) tablet 2.5 mg  2.5 mg Oral Q breakfast Kelvin Cellar, MD   2.5 mg at 07/23/15 [id number]  . metroNIDAZOLE (FLAGYL) IVPB 500 mg  500 mg Intravenous Q8H Kelvin Cellar, MD   500 mg at 07/23/15 1129  . ondansetron (ZOFRAN) tablet 4 mg  4 mg Oral Q6H PRN Kelvin Cellar, MD       Or  . ondansetron (ZOFRAN) injection 4 mg  4 mg Intravenous Q6H PRN Kelvin Cellar, MD      . sodium chloride (OCEAN) 0.65 % nasal spray 1 spray  1 spray Each Nare PRN Samuella Cota, MD      . sodium chloride flush (NS) 0.9 % injection 3 mL  3 mL Intravenous Q12H Kelvin Cellar, MD   10 mL at 07/23/15 1129  . sodium chloride flush (NS) 0.9 % injection 3 mL  3 mL Intravenous PRN Kelvin Cellar, MD         Discharge Medications: Please see discharge summary for a list of discharge medications.  Relevant Imaging Results:  Relevant Lab Results:   Additional Information SS # [id number]  Meegan Shanafelt, [place] An, LCSW

## 2015-07-23 NOTE — Clinical Social Work Note (Signed)
Clinical Social Work Assessment  Patient Details  Name: Natalie [name] MRN: [id number] Date of Birth: 1930-12-09  Date of referral:  07/23/15               Reason for consult:  Facility Placement, Discharge Planning                Permission sought to share information with:  Facility Art therapist granted to share information::  Yes, Verbal Permission Granted  Name::        Agency::     Relationship::     Contact Information:     Housing/Transportation Living arrangements for the past 2 months:  [place] of Information:  Adult Children Patient Interpreter Needed:  None Criminal Activity/Legal Involvement Pertinent to Current Situation/Hospitalization:  No - Comment as needed Significant Relationships:  Adult Children Lives with:  Facility Resident Do you feel safe going back to the place where you live?   (Requesting new SNF placement.) Need for family participation in patient care:  Yes (Comment)  Care giving concerns: Daughter has concerns regarding care provided at [facility].    Social Worker assessment / plan:  Pt hospitalized on 07/22/15 with acute encephalopathy and UTI. Pt had been d/c to [facility] from [facility] on 06/25/15. CSW met with pt this am and was told pt would be returning home at d/c. Pt gave CSW permission to contact her daughter, Vergia Alcon [phone] / [phone] for assistance with d/c planning. Daughter contacted and reported that d/c plan has not yet been determined. Pt may go home with hospice  or go to SNF depending on which SNF has a pvt room available. Permission provided to initiate SNF search. Bed offers are pending. CSW will continue to follow to assist with d/c planning.  Employment status:  Retired Nurse, adult PT Recommendations:  Not assessed at this time Information / Referral to community resources:  [place] [facility]  Patient/Family's Response to care: d/c plan to be  determined  Patient/Family's Understanding of and Emotional Response to Diagnosis, Current Treatment, and Prognosis:  Daughter is aware of pt's medical status. " My mother is not aware of her dx's. She has dementia and I don't want her upset. " Emotional support provided.  Emotional Assessment Appearance:  Appears stated age Attitude/Demeanor/Rapport:  Other (cooperative) Affect (typically observed):  Calm, Appropriate, Pleasant Orientation:  Oriented to Self, Oriented to Place, Oriented to Situation Alcohol / Substance use:  Not Applicable Psych involvement (Current and /or in the community):  No (Comment)  Discharge Needs  Concerns to be addressed:  Discharge Planning Concerns Readmission within the last 30 days:  Yes Current discharge risk:  None Barriers to Discharge:  No Barriers Identified   Luretha Rued, [place] 07/23/2015, 4:32 PM [phone]

## 2015-07-23 NOTE — Progress Notes (Signed)
[facility] - [phone]   [facility] RN visit   Met with daughter Vergia Alcon to provide information regarding Hospice services at home.  Education provided, questions answered.    Daughter uncertain as to discharge disposition.  She would still like to consider all her options but verbalized wanting comfort for her mom .  Daughter does not want the patient to return to [facility].   [facility] pamphlet and contact numbers were given to her.   [facility] will follow and be available once discharge plans are confirmed.   If she does decide to pursue Hospice at home upon discharge she would like a transport w/c ordered.    [facility] will continue to follow. Please call with any questions or concerns.   Mickie Kay , [facility]  [phone]

## 2015-07-23 NOTE — Progress Notes (Signed)
This CM was alerted by [facility] rep that daughter [name] is unsure whether pt will DC home with hospice or go to another SNF other than [facility].  PT eval ordered to assist with disposition planning. Per CSW, PT eval will be needed for faxing out pt info to other SNFs.  CM will continue to follow and assist as needed. Marney Doctor RN,BSN,NCM [phone]

## 2015-07-23 NOTE — Progress Notes (Signed)
PROGRESS NOTE  Natalie [name] [id number] DOB: 22-Nov-1930 DOA: 07/21/2015 PCP: Marton Redwood, MD Outpatient Specialists:  Brief Narrative: 82 year old woman status post palliative loop colostomy for metastatic colon cancer March 2017 presented with low-grade fever, agitation, failure to thrive. Workup suggested UTI and colitis.  Assessment/Plan: 1. Acute encephalopathy superimposed on dementia, secondary to infection. Much improved. Appears to be at baseline at this time. 2. UTI with reported low-grade fever. Appears to be clinically improving. 3. Colitis. Continue empiric antibiotics. GI consult by general surgery. 4. Anemia of chronic disease, suspected iron deficiency anemia secondary to underlying colon cancer. Stable. 5. Atrial fibrillation on Pradaxa, hold per surgery for risk of bleeding form tumor. 6. Unresectable distal sigmoid adenocarcinoma with near obstruction with mets to lung. Underwent palliative laparoscopic and open loop colostomy in the [facility] with Dr. Hassell Done on 06/18/15. Because she has a diverting LOOP colostomy not unusual to have stool per rectum. Patient not aware of dx per family request. 7. Dementia with agitation. Currently stable.  8. Chronic pain secondary to malignancy. Continue methadone. 2. [facility] to be [facility] from [facility] to home under hospice 5/8. Family interested in one particular skilled nursing facility, otherwise will plan to take home.   Better today. Daughter requests Xanax as needed, especially in the evening as the patient's behavior becomes worse in the evening.   Continue empiric antibiotics. GI consult per general surgery.  DVT prophylaxis: Enoxaparin Code Status: DNR per daughter [name] Family Communication: daughter [name] at bedside Disposition Plan: SNF vs home with   Murray Hodgkins, MD  [facility] Direct contact:  --Via amion app OR  --[email or web address]; password [email or web address] and click  [email or web address] contact night coverage as  above 07/23/2015, 2:44 PM  LOS: 1 day   Consultants:  General surgery  Procedures:    Antimicrobials:  Ceftriaxone 5/1 >>   Metronidazole 5/1 >>   HPI/Subjective: Feels better. Some lower abdominal soreness.  Objective: Filed Vitals:   07/22/15 1504 07/22/15 2129 07/23/15 0533 07/23/15 1330  BP: 135/83 143/98 149/101 139/85  Pulse: 109 94 93 87  Temp: 98.5 F (36.9 C) 98.7 F (37.1 C) 98.2 F (36.8 C) 98.6 F (37 C)  TempSrc: Oral Oral Oral Oral  Resp: 18 17 17 16   Height:      Weight:      SpO2: 99% 97% 96% 97%    Intake/Output Summary (Last 24 hours) at 07/23/15 1444 Last data filed at 07/23/15 1230  Gross per 24 hour  Intake    720 ml  Output      0 ml  Net    720 ml     Filed Weights   07/22/15 0900  Weight: 57.2 kg (126 lb 1.7 oz)    Exam:    Constitutional:  . Appears calm and comfortable Respiratory:  . CTA bilaterally, no w/r/r.  . Respiratory effort normal. No retractions or accessory muscle use Cardiovascular:  . RRR, no m/r/g . No LE extremity edema   Abdomen:  . Abdomen with colostomy. Soft, nondistended. Psychiatric:  . Mental status o Mood, affect appropriate  I have personally reviewed following labs and imaging studies:  Potassium 3.2, basic metabolic panel otherwise unremarkable  Hemoglobin stable at 0.6. Leukocytosis has resolved.  Scheduled Meds: . bisoprolol  10 mg Oral Daily  . cefTRIAXone (ROCEPHIN)  IV  1 g Intravenous Q24H  . cloNIDine  0.1 mg Oral Daily  . enoxaparin (LOVENOX) injection  40 mg Subcutaneous Daily  . fluconazole (DIFLUCAN) IV  100 mg  Intravenous Q24H  . imipramine  15 mg Oral QHS  . lisinopril  40 mg Oral Daily  . methadone  2.5 mg Oral Q breakfast  . metronidazole  500 mg Intravenous Q8H  . sodium chloride flush  3 mL Intravenous Q12H   Continuous Infusions:   Principal Problem:   UTI (lower urinary tract infection) Active Problems:   Hypothyroidism   Essential hypertension   COPD  (chronic obstructive pulmonary disease) (HCC)   Adenocarcinoma of sigmoid colon (H[facility])   Colitis, acute   Acute encephalopathy   LOS: 1 day   Time spent 25 minutes

## 2015-07-23 NOTE — Progress Notes (Signed)
[facility] Progress Note     Subjective: Pt doing well, really enjoying her soup.  No N/V, having BM's.  Less diarrhea.  Ambulating well.  Daughter at bedside.   Objective: Vital signs in last 24 hours: Temp:  [98.2 F (36.8 C)-98.7 F (37.1 C)] 98.2 F (36.8 C) (05/02 0533) Pulse Rate:  [93-109] 93 (05/02 0533) Resp:  [17-18] 17 (05/02 0533) BP: (135-149)/(83-101) 149/101 mmHg (05/02 0533) SpO2:  [96 %-99 %] 96 % (05/02 0533) Last BM Date: 07/22/15  Intake/Output from previous day: 05/01 0701 - 05/02 0700 In: 480 [P.O.:480] Out: -  Intake/Output this shift: Total I/O In: 240 [P.O.:240] Out: -   PE: Gen:  Alert, NAD, pleasant (gets aggressive intermittantly) Card:  RRR, no M/G/R heard Pulm:  CTA, no W/R/R Abd: Soft, NT/ND, +BS, ostomy pink and patent with stools and flatus, ostomy bag fitting correctly around ostomy   Lab Results:   Recent Labs  07/22/15 0026 07/23/15 0324  WBC 12.7* 9.0  HGB 8.7* 8.6*  HCT 27.2* 27.2*  PLT 556* 547*   BMET  Recent Labs  07/22/15 0026 07/23/15 0324  NA 137 139  K 3.3* 3.2*  CL 100* 103  CO2 28 27  GLUCOSE 113* 99  BUN 10 5*  CREATININE 0.56 0.62  CALCIUM 8.7* 8.4*   PT/INR No results for input(s): LABPROT, INR in the last 72 hours. CMP     Component Value Date/Time   NA 139 07/23/2015 0324   NA 138 07/12/2015   K 3.2* 07/23/2015 0324   CL 103 07/23/2015 0324   CO2 27 07/23/2015 0324   GLUCOSE 99 07/23/2015 0324   BUN 5* 07/23/2015 0324   BUN 20 07/12/2015   CREATININE 0.62 07/23/2015 0324   CREATININE 0.5 07/12/2015   CALCIUM 8.4* 07/23/2015 0324   PROT 5.9* 06/12/2015 0455   ALBUMIN 2.9* 06/12/2015 0455   AST 19 06/27/2015   ALT 19 06/27/2015   ALKPHOS 74 06/27/2015   BILITOT 0.3 06/12/2015 0455   GFRNONAA >60 07/23/2015 0324   GFRAA >60 07/23/2015 0324   Lipase     Component Value Date/Time   LIPASE 38 06/10/2015 1131       Studies/Results: Ct Abdomen Pelvis Wo  Contrast  07/22/2015  CLINICAL DATA:  Low grade fever and right lower quadrant pain. Recent surgery. Elevated white cell count. History of hypertension, colon cancer post colectomy and colostomy, chronic back pain and abdominal pain. Previous cholecystectomy and appendectomy. No IV access. EXAM: CT ABDOMEN AND PELVIS WITHOUT CONTRAST TECHNIQUE: Multidetector CT imaging of the abdomen and pelvis was performed following the standard protocol without IV contrast. COMPARISON:  06/13/2015 FINDINGS: Atelectasis in the lung bases with minimal pleural effusions. Coronary artery calcifications. Surgical absence of the gallbladder. No bile duct dilatation. The unenhanced appearance of the liver, spleen, pancreas, adrenal glands, inferior vena cava, and retroperitoneal lymph nodes is unremarkable. Prominent calcification in the abdominal aorta without aneurysm. No hydronephrosis or stone in either kidney. Right kidney demonstrates multiple exophytic lesions likely representing cysts. One of the lesions is hyperdense probably representing hemorrhagic cyst. One of the lesions in the lower pole contains calcification consistent with complex cyst. No change since prior study. On the left kidney, there is a solid-appearing lesion measuring about 2.2 cm diameter. No definite change in size since previous study. Small renal cell carcinoma or adenoma not excluded. The stomach, small bowel, and colon are not abnormally distended. Diverticulum at the second portion of the duodenum. No free air  or free fluid in the abdomen. Pelvis: Interval postoperative changes with left lower quadrant descending colostomy. The sigmoid colon demonstrates prominent wall thickening involving the segments leading into and out out the colostomy. This could represent colitis of nonspecific etiology. The previous sigmoid colon mass appears to have been removed in the interval. Bladder wall is not thickened. No free or loculated pelvic fluid collections.  Appendix is not identified. Postoperative changes in the left hip. Tarlov cysts in the sacrum. Degenerative changes in the lumbar spine. No destructive bone lesions. IMPRESSION: Postoperative changes with left lower quadrant colostomy and resection of the sigmoid lesion seen previously. The sigmoid colon entering and exiting the colostomy head extending down towards the rectum demonstrates diffuse wall thickening suggesting colitis of nonspecific etiology. This could represent infectious or inflammatory process or ischemic process. Solid mass on the left kidney appears unchanged since previous study. Cysts on the right kidney are also unchanged, including a calcified cyst and a hemorrhagic cyst. Small bilateral pleural effusions have improved since prior study. Electronically Signed   By: Lucienne Capers M.D.   On: 07/22/2015 06:11   Dg Chest 2 View  07/22/2015  CLINICAL DATA:  Intermittent fever.  Lower extremity swelling. EXAM: CHEST  2 VIEW COMPARISON:  06/10/2015 FINDINGS: There is unchanged mild left hemidiaphragm elevation. Mild linear scarring in the lateral left lung base. The lungs are otherwise clear. Hilar and mediastinal contours are unremarkable and unchanged. Pulmonary vasculature is normal. No effusions are evident. IMPRESSION: No active cardiopulmonary disease. Electronically Signed   By: Andreas Newport M.D.   On: 07/22/2015 01:02    Anti-infectives: Anti-infectives    Start     Dose/Rate Route Frequency Ordered Stop   07/22/15 2200  cefTRIAXone (ROCEPHIN) 1 g in dextrose 5 % 50 mL IVPB     1 g 100 mL/hr over 30 Minutes Intravenous Every 24 hours 07/22/15 1028     07/22/15 1600  fluconazole (DIFLUCAN) IVPB 100 mg     100 mg 50 mL/hr over 60 Minutes Intravenous Every 24 hours 07/22/15 1450     07/22/15 1200  metroNIDAZOLE (FLAGYL) IVPB 500 mg     500 mg 100 mL/hr over 60 Minutes Intravenous Every 8 hours 07/22/15 1028     07/22/15 0630  metroNIDAZOLE (FLAGYL) IVPB 500 mg     500  mg 100 mL/hr over 60 Minutes Intravenous  Once 07/22/15 0626 07/22/15 0749   07/22/15 0330  cefTRIAXone (ROCEPHIN) 1 g in dextrose 5 % 50 mL IVPB     1 g 100 mL/hr over 30 Minutes Intravenous  Once 07/22/15 0320 07/22/15 0401       Assessment/Plan Hospitalization from 06/10/15 - 06/25/15 For GI bleed, 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 Hassell Done 06/17/15 -CT scan 07/21/15 shows normal post-op changes, diffuse wall thickening of sigmoid to rectum likely from cancer vs colitis -Called GI consult for colitis - Dr. Penelope Coop to see -Her daughter does not want to pursue oncology referral -Clear liquids ok, IS, pain control, mobilize -Functioning ostomy, ordered WOC consult for skin concerns around ostomy/pouch -Because she has a diverting LOOP colostomy not unusual to have stool per rectum  -GI panel negative, c.diff pending -Regular diet, no need to be on a heart healthy diet  Re-hospitalized 07/22/15 for AMS, agitation, UTI AFIB on Elliquis - It is best to hold for now given bleed from the tumor  Alzheimer's dementia/organic brain dysfunction - because  of this the family has kept it her cancer diagnosis from her Frequent UTI's HTN Anxiety COPD Encephalopathy VTE prophylaxis-on lovenox    LOS: 1 day    Nat Christen 07/23/2015, 12:10 PM Pager: [phone]  (7am - 4:30pm M-F; 7am - 11:30am Sa/Su)

## 2015-07-24 ENCOUNTER — Inpatient Hospital Stay (HOSPITAL_COMMUNITY): Payer: Medicare Other

## 2015-07-24 DIAGNOSIS — N390 Urinary tract infection, site not specified: Principal | ICD-10-CM

## 2015-07-24 DIAGNOSIS — K529 Noninfective gastroenteritis and colitis, unspecified: Secondary | ICD-10-CM

## 2015-07-24 DIAGNOSIS — I10 Essential (primary) hypertension: Secondary | ICD-10-CM

## 2015-07-24 DIAGNOSIS — G9340 Encephalopathy, unspecified: Secondary | ICD-10-CM

## 2015-07-24 MED ORDER — METHADONE HCL 5 MG PO TABS
5.0000 mg | ORAL_TABLET | Freq: Every day | ORAL | Status: DC
Start: 2015-07-25 — End: 2015-07-25
  Administered 2015-07-25: 5 mg via ORAL
  Filled 2015-07-24: qty 1

## 2015-07-24 MED ORDER — OXYCODONE HCL 5 MG PO TABS
10.0000 mg | ORAL_TABLET | ORAL | Status: DC | PRN
  Administered 2015-07-24 – 2015-07-25 (×3): 10 mg via ORAL
  Filled 2015-07-24 (×3): qty 2

## 2015-07-24 MED ORDER — POLYETHYLENE GLYCOL 3350 17 G PO PACK
17.0000 g | PACK | Freq: Every day | ORAL | Status: DC
  Administered 2015-07-24 – 2015-07-25 (×2): 17 g via ORAL
  Filled 2015-07-24 (×2): qty 1

## 2015-07-24 NOTE — Progress Notes (Addendum)
PROGRESS NOTE        PATIENT DETAILS Name: Natalie [name] Age: 82 y.o. Sex: female Date of Birth: 02-25-1931 Admit Date: 07/21/2015 Admitting Physician Kelvin Cellar, MD [id number], Gwyndolyn Saxon, MD Outpatient Specialists:Dr Hassell Done  Brief Narrative: Patient is a 82 y.o. female 82 year old woman status post palliative loop colostomy for metastatic colon cancer March 2017 presented with low-grade fever, agitation, failure to thrive. Workup suggested UTI and colitis.  Subjective: Main issue is pain in her left lower quadrant.   Assessment/Plan: Acute metabolic encephalopathy: Resolved, likely secondary to UTI and colitis. Has significant dementia at baseline, her mental status currently appears to be back to usual baseline.  UTI: Very hard to discern whether this is symptomatic UTI or asymptomatic bacteriuria, urine culture shows multiple bacterial morphotypes. Will complete at least 3 days of Rocephin.No point in the reculturing urine as patient already on IV antibiotics.  Colitis: Likely ischemic colitis. Stool GI pathogen panel negative. Will discontinue Flagyl, will be on Rocephin for UTI.  Anemia: Hemoglobin stable-anemia is likely a combination of chronic iron deficiency from sigmoid mass and anemia of chronic disease. Follow periodically. No indication for transfusion at this time. Family slowly transitioning to hospice/comfort measures.  History of  atrial fibrillation: Continue bisoprolol for rate control, anticoagulation discontinued due to concern for bleeding. Furthermore, patient is frail-and now has unresectable metastatic colon cancer and is a poor candidate for long-term anticoagulation. Risks of bleeding discussed with daughter at bedside, she agrees to permanently discontinue anticoagulation. She is aware of risks of catastrophic CVA.  Unresectable sigmoid adenocarcinoma with possible metastatic lesions to the lung:Underwent palliative laparoscopic  and open loop colostomy in the [facility] with Dr. Hassell Done on 06/18/15. Patient has significant dementia, and is currently not aware of diagnosis per family request. Family not interested in pursuing further treatment including oncology evaluation. Per daughter at bedside, family has had a informal telephone consultation with oncologist (Dr Parks Neptune is a family friend, and have decided not to pursue any form oncology evaluation for chemotherapy/radiation. Family aware of poor overall prognoses, interested in taking patient home with home hospice  History of dementia with delirium: Currently stable.  Chronic pain syndrome: Mostly in the left lower quadrant-likely secondary to underlying malignancy. Since being appears to be much worse today, will increase methadone to 5 mg and use oxycodone for breakthrough pain. Will place on bowel regimen with MiraLAX.  Hypertension: Controlled with bisoprolol, clonidine and lisinopril.  Palliative care: 82 year old patient with advanced dementia-now recently diagnosed with unresectable sigmoid adenocarcinoma with possible metastatic lesions to the lung. Very poor overall prognoses, suspected left expectancy only a few months at best. Palliative care following, family plans on taking her home with hospice. Case management to evaluate for equipment needs another discharge needs prior to tentative discharge on 5/4 or 5/5.   DVT Prophylaxis: Prophylactic Lovenox   Code Status:  DNR  Family Communication: Daughter at bedside  Disposition Plan: Remain inpatient-but will plan on Home with hospice in a day or two  Antimicrobial agents: Rocephin 5/1>> Flagyl 5/1>>5/2  Procedures: None  CONSULTS:  GI and general surgery  Time spent: 25- minutes-Greater than 50% of this time was spent in counseling, explanation of diagnosis, planning of further management, and coordination of care.  MEDICATIONS: Anti-infectives    Start     Dose/Rate Route Frequency Ordered  Stop   07/22/15 2200  cefTRIAXone (ROCEPHIN) 1 g in dextrose 5 % 50 mL IVPB     1 g 100 mL/hr over 30 Minutes Intravenous Every 24 hours 07/22/15 1028     07/22/15 1600  fluconazole (DIFLUCAN) IVPB 100 mg     100 mg 50 mL/hr over 60 Minutes Intravenous Every 24 hours 07/22/15 1450     07/22/15 1200  metroNIDAZOLE (FLAGYL) IVPB 500 mg     500 mg 100 mL/hr over 60 Minutes Intravenous Every 8 hours 07/22/15 1028     07/22/15 0630  metroNIDAZOLE (FLAGYL) IVPB 500 mg     500 mg 100 mL/hr over 60 Minutes Intravenous  Once 07/22/15 0626 07/22/15 0749   07/22/15 0330  cefTRIAXone (ROCEPHIN) 1 g in dextrose 5 % 50 mL IVPB     1 g 100 mL/hr over 30 Minutes Intravenous  Once 07/22/15 0320 07/22/15 0401      Scheduled Meds: . bisoprolol  10 mg Oral Daily  . cefTRIAXone (ROCEPHIN)  IV  1 g Intravenous Q24H  . cloNIDine  0.1 mg Oral Daily  . enoxaparin (LOVENOX) injection  40 mg Subcutaneous Daily  . fluconazole (DIFLUCAN) IV  100 mg Intravenous Q24H  . imipramine  15 mg Oral QHS  . lisinopril  40 mg Oral Daily  . [START ON 07/25/2015] methadone  5 mg Oral Q breakfast  . metronidazole  500 mg Intravenous Q8H  . sodium chloride flush  3 mL Intravenous Q12H   Continuous Infusions:  PRN Meds:.sodium chloride, ALPRAZolam, labetalol, ondansetron **OR** ondansetron (ZOFRAN) IV, oxyCODONE, sodium chloride, sodium chloride flush   PHYSICAL EXAM: Vital signs: Filed Vitals:   07/23/15 2243 07/24/15 0640 07/24/15 0926 07/24/15 1147  BP: 158/106 151/101 150/111 144/98  Pulse: 87 88  94  Temp: 98.2 F (36.8 C) 98.3 F (36.8 C)  98.2 F (36.8 C)  TempSrc: Oral Oral  Oral  Resp: 16 18  16   Height:      Weight:      SpO2: 98% 97%  99%   Filed Weights   07/22/15 0900  Weight: 57.2 kg (126 lb 1.7 oz)   Body mass index is 22.34 kg/(m^2).   Gen Exam: Awake and alert with clear speech. Not in any distress  Neck: Supple, No JVD.   Chest: B/L Clear.   CVS: S1 S2 Regular, no murmurs.  Abdomen:  soft, BS +,mildly tender in LLQ, non distended.  Extremities: no edema, lower extremities warm to touch. Neurologic: Non Focal.   Skin: No Rash or lesions   Wounds: N/A.   LABORATORY DATA: CBC:  Recent Labs Lab 07/22/15 0026 07/23/15 0324  WBC 12.7* 9.0  NEUTROABS 8.9*  --   HGB 8.7* 8.6*  HCT 27.2* 27.2*  MCV 76.0* 76.0*  PLT 556* 547*    Basic Metabolic Panel:  Recent Labs Lab 07/22/15 0026 07/23/15 0324  NA 137 139  K 3.3* 3.2*  CL 100* 103  CO2 28 27  GLUCOSE 113* 99  BUN 10 5*  CREATININE 0.56 0.62  CALCIUM 8.7* 8.4*    GFR: Estimated Creatinine Clearance: 44.8 mL/min (by C-G formula based on Cr of 0.62).  Liver Function Tests: No results for input(s): AST, ALT, ALKPHOS, BILITOT, PROT, ALBUMIN in the last 168 hours. No results for input(s): LIPASE, AMYLASE in the last 168 hours. No results for input(s): AMMONIA in the last 168 hours.  Coagulation Profile: No results for input(s): INR, PROTIME in the last 168 hours.  Cardiac Enzymes: No results for input(s): CKTOTAL, CKMB,  CKMBINDEX, TROPONINI in the last 168 hours.  BNP (last 3 results) No results for input(s): PROBNP in the last 8760 hours.  HbA1C: No results for input(s): HGBA1C in the last 72 hours.  CBG: No results for input(s): GLUCAP in the last 168 hours.  Lipid Profile: No results for input(s): CHOL, HDL, LDLCALC, TRIG, CHOLHDL, LDLDIRECT in the last 72 hours.  Thyroid Function Tests: No results for input(s): TSH, T4TOTAL, FREET4, T3FREE, THYROIDAB in the last 72 hours.  Anemia Panel: No results for input(s): VITAMINB12, FOLATE, FERRITIN, TIBC, IRON, RETICCTPCT in the last 72 hours.  Urine analysis:    Component Value Date/Time   COLORURINE YELLOW 07/22/2015 0122   APPEARANCEUR CLOUDY* 07/22/2015 0122   LABSPEC 1.014 07/22/2015 0122   PHURINE 5.5 07/22/2015 0122   GLUCOSEU NEGATIVE 07/22/2015 0122   HGBUR MODERATE* 07/22/2015 0122   BILIRUBINUR NEGATIVE 07/22/2015 0122    KETONESUR NEGATIVE 07/22/2015 0122   PROTEINUR NEGATIVE 07/22/2015 0122   UROBILINOGEN 0.2 12/01/2014 0340   NITRITE POSITIVE* 07/22/2015 0122   LEUKOCYTESUR SMALL* 07/22/2015 0122    Sepsis Labs: Lactic Acid, Venous    Component Value Date/Time   LATICACIDVEN 0.99 06/10/2015 1514    MICROBIOLOGY: Recent Results (from the past 240 hour(s))  Urine culture     Status: Abnormal   Collection Time: 07/22/15  1:22 AM  Result Value Ref Range Status   Specimen Description URINE, CATHETERIZED  Final   Special Requests NONE  Final   Culture MULTIPLE SPECIES PRESENT, SUGGEST RECOLLECTION (A)  Final   Report Status 07/23/2015 FINAL  Final  Gastrointestinal Panel by PCR , Stool     Status: None   Collection Time: 07/22/15  1:05 PM  Result Value Ref Range Status   Campylobacter species NOT DETECTED NOT DETECTED Final   Plesimonas shigelloides NOT DETECTED NOT DETECTED Final   Salmonella species NOT DETECTED NOT DETECTED Final   Yersinia enterocolitica NOT DETECTED NOT DETECTED Final   Vibrio species NOT DETECTED NOT DETECTED Final   Vibrio cholerae NOT DETECTED NOT DETECTED Final   Enteroaggregative E coli (EAEC) NOT DETECTED NOT DETECTED Final   Enteropathogenic E coli (EPEC) NOT DETECTED NOT DETECTED Final   Enterotoxigenic E coli (ETEC) NOT DETECTED NOT DETECTED Final   Shiga like toxin producing E coli (STEC) NOT DETECTED NOT DETECTED Final   E. coli O157 NOT DETECTED NOT DETECTED Final   Shigella/Enteroinvasive E coli (EIEC) NOT DETECTED NOT DETECTED Final   Cryptosporidium NOT DETECTED NOT DETECTED Final   Cyclospora cayetanensis NOT DETECTED NOT DETECTED Final   Entamoeba histolytica NOT DETECTED NOT DETECTED Final   Giardia lamblia NOT DETECTED NOT DETECTED Final   Adenovirus F40/41 NOT DETECTED NOT DETECTED Final   Astrovirus NOT DETECTED NOT DETECTED Final   Norovirus GI/GII NOT DETECTED NOT DETECTED Final   Rotavirus A NOT DETECTED NOT DETECTED Final   Sapovirus (I, II, IV,  and V) NOT DETECTED NOT DETECTED Final  MRSA PCR Screening     Status: None   Collection Time: 07/22/15  6:34 PM  Result Value Ref Range Status   MRSA by PCR NEGATIVE NEGATIVE Final    Comment:        The GeneXpert MRSA Assay (FDA approved for NASAL specimens only), is one component of a comprehensive MRSA colonization surveillance program. It is not intended to diagnose MRSA infection nor to guide or monitor treatment for MRSA infections.     RADIOLOGY STUDIES/RESULTS: Ct Abdomen Pelvis Wo Contrast  07/22/2015  CLINICAL DATA:  Low grade fever and right lower quadrant pain. Recent surgery. Elevated white cell count. History of hypertension, colon cancer post colectomy and colostomy, chronic back pain and abdominal pain. Previous cholecystectomy and appendectomy. No IV access. EXAM: CT ABDOMEN AND PELVIS WITHOUT CONTRAST TECHNIQUE: Multidetector CT imaging of the abdomen and pelvis was performed following the standard protocol without IV contrast. COMPARISON:  06/13/2015 FINDINGS: Atelectasis in the lung bases with minimal pleural effusions. Coronary artery calcifications. Surgical absence of the gallbladder. No bile duct dilatation. The unenhanced appearance of the liver, spleen, pancreas, adrenal glands, inferior vena cava, and retroperitoneal lymph nodes is unremarkable. Prominent calcification in the abdominal aorta without aneurysm. No hydronephrosis or stone in either kidney. Right kidney demonstrates multiple exophytic lesions likely representing cysts. One of the lesions is hyperdense probably representing hemorrhagic cyst. One of the lesions in the lower pole contains calcification consistent with complex cyst. No change since prior study. On the left kidney, there is a solid-appearing lesion measuring about 2.2 cm diameter. No definite change in size since previous study. Small renal cell carcinoma or adenoma not excluded. The stomach, small bowel, and colon are not abnormally distended.  Diverticulum at the second portion of the duodenum. No free air or free fluid in the abdomen. Pelvis: Interval postoperative changes with left lower quadrant descending colostomy. The sigmoid colon demonstrates prominent wall thickening involving the segments leading into and out out the colostomy. This could represent colitis of nonspecific etiology. The previous sigmoid colon mass appears to have been removed in the interval. Bladder wall is not thickened. No free or loculated pelvic fluid collections. Appendix is not identified. Postoperative changes in the left hip. Tarlov cysts in the sacrum. Degenerative changes in the lumbar spine. No destructive bone lesions. IMPRESSION: Postoperative changes with left lower quadrant colostomy and resection of the sigmoid lesion seen previously. The sigmoid colon entering and exiting the colostomy head extending down towards the rectum demonstrates diffuse wall thickening suggesting colitis of nonspecific etiology. This could represent infectious or inflammatory process or ischemic process. Solid mass on the left kidney appears unchanged since previous study. Cysts on the right kidney are also unchanged, including a calcified cyst and a hemorrhagic cyst. Small bilateral pleural effusions have improved since prior study. Electronically Signed   By: Lucienne Capers M.D.   On: 07/22/2015 06:11   Dg Chest 2 View  07/22/2015  CLINICAL DATA:  Intermittent fever.  Lower extremity swelling. EXAM: CHEST  2 VIEW COMPARISON:  06/10/2015 FINDINGS: There is unchanged mild left hemidiaphragm elevation. Mild linear scarring in the lateral left lung base. The lungs are otherwise clear. Hilar and mediastinal contours are unremarkable and unchanged. Pulmonary vasculature is normal. No effusions are evident. IMPRESSION: No active cardiopulmonary disease. Electronically Signed   By: Andreas Newport M.D.   On: 07/22/2015 01:02   Dg Abd 2 Views  07/24/2015  CLINICAL DATA:  Abdominal pain.  Sigmoid colon cancer status post resection and colostomy. EXAM: ABDOMEN - 2 VIEW COMPARISON:  07/22/2015 CT abdomen/pelvis. FINDINGS: There are a few top-normal caliber small bowel loops with air-fluid levels in the left abdomen. Moderate stool in the proximal colon. No evidence of pneumatosis or pneumoperitoneum. Mild long segment dextrocurvature of the thoracolumbar spine. Mild-to-moderate degenerative changes in the visualized thoracolumbar spine. Trace left pleural effusion. Partially visualized screws in the left proximal femur. IMPRESSION: Top-normal caliber small bowel loops with air-fluid levels in the left abdomen. Moderate proximal colonic stool. Findings suggest a mild adynamic ileus. Trace left pleural effusion. Electronically  Signed   By: Ilona Sorrel M.D.   On: 07/24/2015 12:20     LOS: 2 days   Oren Binet, MD  [facility] Pager:[phone]  If 7PM-7AM, please contact night-coverage [email or web address] Password [email or web address] 07/24/2015, 1:08 PM

## 2015-07-24 NOTE — Progress Notes (Signed)
Subjective: Chronic abdominal pain  Objective: Vital signs in last 24 hours: Temp:  [98.2 F (36.8 C)-98.3 F (36.8 C)] 98.2 F (36.8 C) (05/03 1147) Pulse Rate:  [87-94] 94 (05/03 1147) Resp:  [16-18] 16 (05/03 1147) BP: (144-158)/(98-111) 144/98 mmHg (05/03 1147) SpO2:  [97 %-99 %] 99 % (05/03 1147) Weight change:  Last BM Date: 07/22/15  PE: GEN:  Alert but chronically confused ABD:  Colostomy site noted; mild distended and tympanic; mild tenderness  CT ABD/PELVIS:  Personally reviewed with patient's daughter; diffuse thickening of distal colostomy noted  Lab Results: CBC    Component Value Date/Time   WBC 9.0 07/23/2015 0324   WBC 13.6 07/12/2015   RBC 3.58* 07/23/2015 0324   HGB 8.6* 07/23/2015 0324   HCT 27.2* 07/23/2015 0324   PLT 547* 07/23/2015 0324   MCV 76.0* 07/23/2015 0324   MCH 24.0* 07/23/2015 0324   MCHC 31.6 07/23/2015 0324   RDW 16.8* 07/23/2015 0324   LYMPHSABS 2.4 07/22/2015 0026   MONOABS 1.2* 07/22/2015 0026   EOSABS 0.1 07/22/2015 0026   BASOSABS 0.0 07/22/2015 0026   CMP     Component Value Date/Time   NA 139 07/23/2015 0324   NA 138 07/12/2015   K 3.2* 07/23/2015 0324   CL 103 07/23/2015 0324   CO2 27 07/23/2015 0324   GLUCOSE 99 07/23/2015 0324   BUN 5* 07/23/2015 0324   BUN 20 07/12/2015   CREATININE 0.62 07/23/2015 0324   CREATININE 0.5 07/12/2015   CALCIUM 8.4* 07/23/2015 0324   PROT 5.9* 06/12/2015 0455   ALBUMIN 2.9* 06/12/2015 0455   AST 19 06/27/2015   ALT 19 06/27/2015   ALKPHOS 74 06/27/2015   BILITOT 0.3 06/12/2015 0455   GFRNONAA >60 07/23/2015 0324   GFRAA >60 07/23/2015 0324   Assessment:  1.  Rectosigmoid mass, diverting colostomy done, tumor not resected. 2.  Colonic thickening, suspect ischemic colitis.  No blood in colostomy. 3.  Dementia. 4.  Multiple medical problems.  Plan:  1.  Supportive management (gentle hydration, monitoring). 2.  No plans for further GI tract investigation. 3.  [facility] will  sign-off; please call with questions; thank you for the consultation.   Landry Dyke 07/24/2015, 4:09 PM   Pager [phone] If no answer or after 5 PM call [phone]

## 2015-07-24 NOTE — Progress Notes (Signed)
[facility] 1332 - [facility] RN   Aware of plan to discharge home with hospice on 07/25/15.    Spoke with daughter - confirmed discharge plan.    DME ordered.  Daughter prefers to wait on hospital bed at this time, transport w/c ordered.   [facility] referral center aware.      Thank you!   Mickie Kay, [facility]ason  [phone]

## 2015-07-24 NOTE — Progress Notes (Signed)
CSW assisting with d/c planning. Spoke with pt's daughter this afternoon. She has chosen [facility] for SNF placement. Bed is available tomorrow. CSW will continue to follow to assist with d/c planning.  Werner Lean LCSW [phone]

## 2015-07-24 NOTE — Progress Notes (Signed)
Per conversation with daughter [name], the plan is now for pt to go home with [facility].  This CM alerted CSW of change in plan.  This CM also alerted [facility] rep to inform of discharge plan. No other CM needs communicated at this time. Marney Doctor RN,BSN,NCM [phone]

## 2015-07-24 NOTE — Progress Notes (Signed)
CSW spoke with pt's daughter, [name] , and was informed that pt will return  home at d/c. Bed at [facility] has been cancelled. RNCM is assisting with d/c planning needs. CSW signing off.  Werner Lean LCSW [phone]

## 2015-07-24 NOTE — Progress Notes (Signed)
PT Cancellation Note  Patient Details Name: Natalie [name] MRN: [id number] DOB: December 13, 1930   Cancelled Treatment:    Reason Eval/Treat Not Completed: Patient at procedure or test/unavailable (pt off floor, will follow. )   Philomena Doheny 07/24/2015, 10:13 AM [phone]

## 2015-07-24 NOTE — Progress Notes (Signed)
  Subjective: Complaining of abdominal pain this AM, but ate breakfast without issue.  She has Bowel sounds, but nothing in the ostomy.  She has chronic pain, nurse has never seen her and has no idea what baseline is.  Nor do I.  She is on Methadone for her chronic pain.  Objective: Vital signs in last 24 hours: Temp:  [98.2 F (36.8 C)-98.6 F (37 C)] 98.3 F (36.8 C) (05/03 0640) Pulse Rate:  [87-88] 88 (05/03 0640) Resp:  [16-18] 18 (05/03 0640) BP: (139-158)/(85-106) 151/101 mmHg (05/03 0640) SpO2:  [97 %-98 %] 97 % (05/03 0640) Last BM Date: 07/22/15 600 PO recorded Voided x 6 Nothing recorded from the ostomy, no BM yesterday, one on Monday BP is elevated; diastolic in the [id number] range yesterday K+ 3.2, creatinine is stable WBC is normal Ongoing stable anemia  Intake/Output from previous day: 05/02 0701 - 05/03 0700 In: 907.6 [P.O.:600; I.V.:157.6; IV Piggyback:150] Out: 0  Intake/Output this shift:    General appearance: alert, mild distress and related to pain.   Resp: clear to auscultation bilaterally GI: soft, + BS, nothing in the ostomy.  she says it hurts all over down to her groin.   Lab Results:   Recent Labs  07/22/15 0026 07/23/15 0324  WBC 12.7* 9.0  HGB 8.7* 8.6*  HCT 27.2* 27.2*  PLT 556* 547*    BMET  Recent Labs  07/22/15 0026 07/23/15 0324  NA 137 139  K 3.3* 3.2*  CL 100* 103  CO2 28 27  GLUCOSE 113* 99  BUN 10 5*  CREATININE 0.56 0.62  CALCIUM 8.7* 8.4*   PT/INR No results for input(s): LABPROT, INR in the last 72 hours.  No results for input(s): AST, ALT, ALKPHOS, BILITOT, PROT, ALBUMIN in the last 168 hours.   Lipase     Component Value Date/Time   LIPASE 38 06/10/2015 1131     Studies/Results: No results found.  Medications: . bisoprolol  10 mg Oral Daily  . cefTRIAXone (ROCEPHIN)  IV  1 g Intravenous Q24H  . cloNIDine  0.1 mg Oral Daily  . enoxaparin (LOVENOX) injection  40 mg Subcutaneous Daily  . fluconazole  (DIFLUCAN) IV  100 mg Intravenous Q24H  . imipramine  15 mg Oral QHS  . lisinopril  40 mg Oral Daily  . methadone  2.5 mg Oral Q breakfast  . metronidazole  500 mg Intravenous Q8H  . 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 Hassell Done 06/17/15 Re-hospitalized 07/22/15 for AMS, agitation, UTI AFIB on Elliquis - It is best to hold for now given bleed from the tumor  Alzheimer's dementia/organic brain dysfunction - because of this the family has kept it her cancer diagnosis from her Frequent UTI's HTN Anxiety COPD Encephalopathy FEN:  Regular diet ID:  Day 4 ceftriaxone, diflucan, & flagyl   VTE prophylaxis-on lovenox   Plan:   I believe this is her chronic pain, I'm a bit concerned that with the empty ostomy bag. The ostomy bag was changed on 07/22/15.  I will check a 2 view film on her, just to be sure nothing has changed.     LOS: 2 days    Natalie [name] 07/24/2015 [phone]

## 2015-07-25 DIAGNOSIS — C187 Malignant neoplasm of sigmoid colon: Secondary | ICD-10-CM

## 2015-07-25 MED ORDER — ALPRAZOLAM 0.25 MG PO TABS: 0.2500 mg | ORAL_TABLET | Freq: Three times a day (TID) | ORAL | Status: AC | PRN

## 2015-07-25 MED ORDER — OXYCODONE HCL 10 MG PO TABS: 5.0000 mg | ORAL_TABLET | Freq: Four times a day (QID) | ORAL | Status: AC | PRN

## 2015-07-25 MED ORDER — CLONIDINE HCL 0.1 MG PO TABS: 0.1000 mg | ORAL_TABLET | Freq: Every day | ORAL | Status: AC

## 2015-07-25 MED ORDER — SENNOSIDES-DOCUSATE SODIUM 8.6-50 MG PO TABS: 2.0000 | ORAL_TABLET | Freq: Every evening | ORAL | Status: AC | PRN

## 2015-07-25 MED ORDER — METHADONE HCL 5 MG PO TABS: 5.0000 mg | ORAL_TABLET | Freq: Every day | ORAL | Status: AC

## 2015-07-25 MED ORDER — OXYCODONE HCL 10 MG PO TABS: 10.0000 mg | ORAL_TABLET | Freq: Four times a day (QID) | ORAL | Status: DC | PRN

## 2015-07-25 MED ORDER — OMEPRAZOLE 20 MG PO CPDR: 20.0000 mg | DELAYED_RELEASE_CAPSULE | Freq: Every day | ORAL | Status: AC

## 2015-07-25 MED ORDER — METHADONE HCL 5 MG PO TABS
5.0000 mg | ORAL_TABLET | Freq: Every day | ORAL | Status: DC
Start: 2015-07-26 — End: 2015-07-25

## 2015-07-25 MED ORDER — LISINOPRIL 40 MG PO TABS: 40.0000 mg | ORAL_TABLET | Freq: Every day | ORAL | Status: AC

## 2015-07-25 MED ORDER — BISOPROLOL FUMARATE 10 MG PO TABS: 10.0000 mg | ORAL_TABLET | Freq: Every day | ORAL | Status: AC

## 2015-07-25 MED ORDER — IMIPRAMINE HCL 10 MG PO TABS: 15.0000 mg | ORAL_TABLET | Freq: Every day | ORAL | Status: AC

## 2015-07-25 MED ORDER — POLYETHYLENE GLYCOL 3350 17 G PO PACK: 17.0000 g | PACK | Freq: Every day | ORAL | Status: AC

## 2015-07-25 NOTE — Progress Notes (Signed)
Non emergency ambulance notified on patient's transfer to home. Daughter [name] aware.

## 2015-07-25 NOTE — Progress Notes (Signed)
Denies abd pain Eating some-tolerating diet Appears calm and pain free Spoke with daughter-stable for discharge with home hospice later today.

## 2015-07-25 NOTE — Progress Notes (Signed)
PT Cancellation Note  Patient Details Name: Natalie [name] MRN: [id number] DOB: [birth year]   Cancelled Treatment:    Reason Eval/Treat Not Completed: PT screened, no needs identified, will sign off (pt being discharged home with Hospice.)   Claretha Cooper 07/25/2015, 11:29 AM Tresa Endo PT [phone]

## 2015-07-25 NOTE — Care Management Important Message (Signed)
Important Message  Patient Details  Name: [name] MRN: [id number] Date of Birth: 1930/06/12   Medicare Important Message Given:  Yes    Camillo Flaming 07/25/2015, 9:17 AMImportant Message  Patient Details  Name: [name] MRN: [id number] Date of Birth: [birth year]   Medicare Important Message Given:  Yes    Camillo Flaming 07/25/2015, 9:17 AM

## 2015-07-25 NOTE — Progress Notes (Signed)
Pt to DC home with [facility] today.  Daughter requesting ambulance transport home.  This CM filled out Medical Necessity flow sheet and printed it out for [facility].  This was given to RN along with Face Sheet.  Phone numbers for [facility] and after 5pm [facility] left with staff RN as Daughter would like pt to be brought home at [place].  RN to call ambulance for transport.  [facility] aware of pt discharge time and will see pt at home tomorrow.  No other CM needs communicated. Marney Doctor RN,BSN,NCM [phone]

## 2015-07-25 NOTE — Progress Notes (Signed)
Subjective: Pt up choking some on water, but coughed it up.  Not really complaining to me of pain this AM.  She has allot of gas in her ostomy bag, and a smear of yellowish white thick fluid from her ostomy, but still no stool.  Nurse says she is eating and no nausea with it.    Objective: Vital signs in last 24 hours: Temp:  [98.2 F (36.8 C)] 98.2 F (36.8 C) 08-19-22 1147) Pulse Rate:  [94] 94 Aug 19, 2022 1147) Resp:  [16] 16 2022/08/19 1147) BP: (144-150)/(98-111) 144/98 mmHg Aug 19, 2022 1147) SpO2:  [99 %] 99 % 08-19-2022 1147) Last BM Date: 07/22/15 240 PO and voiding x 1 is all that is recorded She is afebrile, last VS was at 11AM yesterday No labs today Film yesterday shows :  Top-normal caliber small bowel loops with air-fluid levels in the left abdomen. Moderate proximal colonic stool. Findings suggest a mild adynamic ileus. Intake/Output from previous day: 08/19/22 0701 - 05/04 0700 In: 240 [P.O.:240] Out: -  Intake/Output this shift:    General appearance: alert, cooperative, no distress and coughing and nurse has her up in the bed.   Abdomen:  she is sitting up in bed, abdomen is soft, no complaints of pain.  she has flatus in the bag but no real stool. Not really tender this AM.  Lab Results:   Recent Labs  07/23/15 0324  WBC 9.0  HGB 8.6*  HCT 27.2*  PLT 547*    BMET  Recent Labs  07/23/15 0324  NA 139  K 3.2*  CL 103  CO2 27  GLUCOSE 99  BUN 5*  CREATININE 0.62  CALCIUM 8.4*   PT/INR No results for input(s): LABPROT, INR in the last 72 hours.  No results for input(s): AST, ALT, ALKPHOS, BILITOT, PROT, ALBUMIN in the last 168 hours.   Lipase     Component Value Date/Time   LIPASE 38 06/10/2015 1131     Studies/Results: Dg Abd 2 Views  08/19/2015  CLINICAL DATA:  Abdominal pain. Sigmoid colon cancer status post resection and colostomy. EXAM: ABDOMEN - 2 VIEW COMPARISON:  07/22/2015 CT abdomen/pelvis. FINDINGS: There are a few top-normal caliber small bowel  loops with air-fluid levels in the left abdomen. Moderate stool in the proximal colon. No evidence of pneumatosis or pneumoperitoneum. Mild long segment dextrocurvature of the thoracolumbar spine. Mild-to-moderate degenerative changes in the visualized thoracolumbar spine. Trace left pleural effusion. Partially visualized screws in the left proximal femur. IMPRESSION: Top-normal caliber small bowel loops with air-fluid levels in the left abdomen. Moderate proximal colonic stool. Findings suggest a mild adynamic ileus. Trace left pleural effusion. Electronically Signed   By: Ilona Sorrel M.D.   On: 08/19/2015 12:20    Medications: . bisoprolol  10 mg Oral Daily  . cefTRIAXone (ROCEPHIN)  IV  1 g Intravenous Q24H  . cloNIDine  0.1 mg Oral Daily  . enoxaparin (LOVENOX) injection  40 mg Subcutaneous Daily  . fluconazole (DIFLUCAN) IV  100 mg Intravenous Q24H  . imipramine  15 mg Oral QHS  . lisinopril  40 mg Oral Daily  . methadone  5 mg Oral Q breakfast  . polyethylene glycol  17 g Oral Daily  . 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 Hassell Done 06/17/15 Re-hospitalized 07/22/15 for AMS, agitation, UTI  AFIB on Elliquis - It is best to hold for now given bleed from the tumor  Alzheimer's dementia/organic brain dysfunction - because of this the family has kept it her cancer diagnosis from her Frequent UTI's HTN Anxiety COPD Encephalopathy FEN: Regular diet ID: Day 5 ceftriaxone, diflucan, & flagyl  VTE prophylaxis-on lovenox     Plan:  She seems to be tolerating diet, no nausea or vomiting.  I would continue to watch her closely, but continue to feed her as long as she is not having symptoms.     LOS: 3 days     Kingstin Heims 07/25/2015 [phone]

## 2015-07-25 NOTE — Discharge Summary (Addendum)
PATIENT DETAILS Name: Natalie [name] Age: 82 y.o. Sex: female Date of Birth: November 11, 1930 MRN: [id number]. Admitting Physician: Kelvin Cellar, MD [id number], Gwyndolyn Saxon, MD  Admit Date: 07/21/2015 Discharge date: 07/25/2015  Recommendations for Outpatient Follow-up:  1. Being discharged with home hospice 2. Optimize comfort measures 3. Per request of family-patient unaware of cancer diagnoses (has dementia-but is lucid at times) 4. Slowly minimize/discontinue medications    PRIMARY DISCHARGE DIAGNOSIS:  Principal Problem:   UTI (lower urinary tract infection) Active Problems:   Hypothyroidism   Essential hypertension   COPD (chronic obstructive pulmonary disease) (HCC)   Adenocarcinoma of sigmoid colon (HCC)   Colitis, acute   Acute encephalopathy      PAST MEDICAL HISTORY: Past Medical History  Diagnosis Date  . Osteoporosis   . Hypertension   . Hypothyroidism   . Lower GI bleeding 07/21/11  . Atrial fibrillation ([place])   . COPD (chronic obstructive pulmonary disease) ([place])   . Pneumonia   . Shortness of breath 07/21/11    "anytime"  . Sinus headache 07/21/11    "all the time"  . Bladder infection, chronic   . Arthritis     "everywhere"  . Anxiety   . Chronic lower back pain   . Chronic abdominal pain   . Renal mass, left 2013  . PUD (peptic ulcer disease)   . Chronic cystitis   . Trigeminal neuralgia     /E-chart  . Chronic kidney disease   . Anemia   . Respiratory failure, extubated 08/15/11 08/21/2011  . Normal left ventricular systolic function by 2D [id number] 08/21/2011  . Physical deconditioning   . Colon adenocarcinoma ([place])   . Thrombocytosis ([place])     DISCHARGE MEDICATIONS: Current Discharge Medication List    START taking these medications   Details  Oxycodone HCl 10 MG TABS Take 0.5-1 tablets (5-10 mg total) by mouth every 6 (six) hours as needed. Qty: 30 tablet, Refills: 0    senna-docusate (SENOKOT-S) 8.6-50 MG tablet Take 2 tablets by mouth  at bedtime as needed for mild constipation. Qty: 60 tablet, Refills: 0      CONTINUE these medications which have CHANGED   Details  ALPRAZolam (XANAX) 0.25 MG tablet Take 1 tablet (0.25 mg total) by mouth 3 (three) times daily as needed for anxiety. Take one tablet by mouth every 6 hours as needed for anxiety Qty: 30 tablet, Refills: 0    bisoprolol (ZEBETA) 10 MG tablet Take 1 tablet (10 mg total) by mouth daily. Hold if HR is less than 60 or SBP is less then 110. Qty: 30 tablet, Refills: 0    cloNIDine (CATAPRES) 0.1 MG tablet Take 1 tablet (0.1 mg total) by mouth daily. Reported on 06/10/2015 Qty: 60 tablet, Refills: 0    imipramine (TOFRANIL) 10 MG tablet Take 1.5 tablets (15 mg total) by mouth at bedtime. Take 1-1/2 tabs to = 15 mg po QHS Qty: 30 tablet, Refills: 0    lisinopril (PRINIVIL,ZESTRIL) 40 MG tablet Take 1 tablet (40 mg total) by mouth daily. Qty: 30 tablet, Refills: 0    methadone (DOLOPHINE) 5 MG tablet Take 1 tablet (5 mg total) by mouth daily. Qty: 15 tablet, Refills: 0    omeprazole (PRILOSEC) 20 MG capsule Take 1 capsule (20 mg total) by mouth daily. Give at 12PM Qty: 30 capsule, Refills: 0    polyethylene glycol (MIRALAX / GLYCOLAX) packet Take 17 g by mouth daily. Qty: 14 each, Refills: 0      CONTINUE  these medications which have NOT CHANGED   Details  acetaminophen (TYLENOL) 500 MG tablet Take 500 mg by mouth every 8 (eight) hours as needed for mild pain.    simethicone (MYLICON) 80 MG chewable tablet Chew 160 mg by mouth every 6 (six) hours as needed for flatulence.      STOP taking these medications     docusate sodium (COLACE) 100 MG capsule      ferrous sulfate 325 (65 FE) MG tablet      HYDROcodone-acetaminophen (NORCO/VICODIN) 5-325 MG tablet      Protein (PROCEL) POWD      spironolactone (ALDACTONE) 25 MG tablet      traMADol (ULTRAM) 50 MG tablet         ALLERGIES:   Allergies  Allergen Reactions  . Cymbalta [Duloxetine Hcl]  Swelling  . Levofloxacin Other (See Comments)    REACTION: unspecified per [facility]    BRIEF HPI:  See H&P, Labs, Consult and Test reports for all details in brief, Patient is a 82 y.o. female 82 year old woman status post palliative loop colostomy for metastatic colon cancer March 2017 presented with low-grade fever, agitation, failure to thrive. Workup suggested UTI and colitis.   CONSULTATIONS:   GI and general surgery  PERTINENT RADIOLOGIC STUDIES: Ct Abdomen Pelvis Wo Contrast  07/22/2015  CLINICAL DATA:  Low grade fever and right lower quadrant pain. Recent surgery. Elevated white cell count. History of hypertension, colon cancer post colectomy and colostomy, chronic back pain and abdominal pain. Previous cholecystectomy and appendectomy. No IV access. EXAM: CT ABDOMEN AND PELVIS WITHOUT CONTRAST TECHNIQUE: Multidetector CT imaging of the abdomen and pelvis was performed following the standard protocol without IV contrast. COMPARISON:  06/13/2015 FINDINGS: Atelectasis in the lung bases with minimal pleural effusions. Coronary artery calcifications. Surgical absence of the gallbladder. No bile duct dilatation. The unenhanced appearance of the liver, spleen, pancreas, adrenal glands, inferior vena cava, and retroperitoneal lymph nodes is unremarkable. Prominent calcification in the abdominal aorta without aneurysm. No hydronephrosis or stone in either kidney. Right kidney demonstrates multiple exophytic lesions likely representing cysts. One of the lesions is hyperdense probably representing hemorrhagic cyst. One of the lesions in the lower pole contains calcification consistent with complex cyst. No change since prior study. On the left kidney, there is a solid-appearing lesion measuring about 2.2 cm diameter. No definite change in size since previous study. Small renal cell carcinoma or adenoma not excluded. The stomach, small bowel, and colon are not abnormally distended. Diverticulum at the second  portion of the duodenum. No free air or free fluid in the abdomen. Pelvis: Interval postoperative changes with left lower quadrant descending colostomy. The sigmoid colon demonstrates prominent wall thickening involving the segments leading into and out out the colostomy. This could represent colitis of nonspecific etiology. The previous sigmoid colon mass appears to have been removed in the interval. Bladder wall is not thickened. No free or loculated pelvic fluid collections. Appendix is not identified. Postoperative changes in the left hip. Tarlov cysts in the sacrum. Degenerative changes in the lumbar spine. No destructive bone lesions. IMPRESSION: Postoperative changes with left lower quadrant colostomy and resection of the sigmoid lesion seen previously. The sigmoid colon entering and exiting the colostomy head extending down towards the rectum demonstrates diffuse wall thickening suggesting colitis of nonspecific etiology. This could represent infectious or inflammatory process or ischemic process. Solid mass on the left kidney appears unchanged since previous study. Cysts on the right kidney are also unchanged, including a calcified  cyst and a hemorrhagic cyst. Small bilateral pleural effusions have improved since prior study. Electronically Signed   By: Lucienne Capers M.D.   On: 07/22/2015 06:11   Dg Chest 2 View  07/22/2015  CLINICAL DATA:  Intermittent fever.  Lower extremity swelling. EXAM: CHEST  2 VIEW COMPARISON:  06/10/2015 FINDINGS: There is unchanged mild left hemidiaphragm elevation. Mild linear scarring in the lateral left lung base. The lungs are otherwise clear. Hilar and mediastinal contours are unremarkable and unchanged. Pulmonary vasculature is normal. No effusions are evident. IMPRESSION: No active cardiopulmonary disease. Electronically Signed   By: Andreas Newport M.D.   On: 07/22/2015 01:02   Dg Abd 2 Views  07/24/2015  CLINICAL DATA:  Abdominal pain. Sigmoid colon cancer status  post resection and colostomy. EXAM: ABDOMEN - 2 VIEW COMPARISON:  07/22/2015 CT abdomen/pelvis. FINDINGS: There are a few top-normal caliber small bowel loops with air-fluid levels in the left abdomen. Moderate stool in the proximal colon. No evidence of pneumatosis or pneumoperitoneum. Mild long segment dextrocurvature of the thoracolumbar spine. Mild-to-moderate degenerative changes in the visualized thoracolumbar spine. Trace left pleural effusion. Partially visualized screws in the left proximal femur. IMPRESSION: Top-normal caliber small bowel loops with air-fluid levels in the left abdomen. Moderate proximal colonic stool. Findings suggest a mild adynamic ileus. Trace left pleural effusion. Electronically Signed   By: Ilona Sorrel M.D.   On: 07/24/2015 12:20     PERTINENT LAB RESULTS: CBC:  Recent Labs  07/23/15 0324  WBC 9.0  HGB 8.6*  HCT 27.2*  PLT 547*   CMET CMP     Component Value Date/Time   NA 139 07/23/2015 0324   NA 138 07/12/2015   K 3.2* 07/23/2015 0324   CL 103 07/23/2015 0324   CO2 27 07/23/2015 0324   GLUCOSE 99 07/23/2015 0324   BUN 5* 07/23/2015 0324   BUN 20 07/12/2015   CREATININE 0.62 07/23/2015 0324   CREATININE 0.5 07/12/2015   CALCIUM 8.4* 07/23/2015 0324   PROT 5.9* 06/12/2015 0455   ALBUMIN 2.9* 06/12/2015 0455   AST 19 06/27/2015   ALT 19 06/27/2015   ALKPHOS 74 06/27/2015   BILITOT 0.3 06/12/2015 0455   GFRNONAA >60 07/23/2015 0324   GFRAA >60 07/23/2015 0324    GFR Estimated Creatinine Clearance: 44.8 mL/min (by C-G formula based on Cr of 0.62). No results for input(s): LIPASE, AMYLASE in the last 72 hours. No results for input(s): CKTOTAL, CKMB, CKMBINDEX, TROPONINI in the last 72 hours. Invalid input(s): POCBNP No results for input(s): DDIMER in the last 72 hours. No results for input(s): HGBA1C in the last 72 hours. No results for input(s): CHOL, HDL, LDLCALC, TRIG, CHOLHDL, LDLDIRECT in the last 72 hours. No results for input(s):  TSH, T4TOTAL, T3FREE, THYROIDAB in the last 72 hours.  Invalid input(s): FREET3 No results for input(s): VITAMINB12, FOLATE, FERRITIN, TIBC, IRON, RETICCTPCT in the last 72 hours. Coags: No results for input(s): INR in the last 72 hours.  Invalid input(s): PT Microbiology: Recent Results (from the past 240 hour(s))  Urine culture     Status: Abnormal   Collection Time: 07/22/15  1:22 AM  Result Value Ref Range Status   Specimen Description URINE, CATHETERIZED  Final   Special Requests NONE  Final   Culture MULTIPLE SPECIES PRESENT, SUGGEST RECOLLECTION (A)  Final   Report Status 07/23/2015 FINAL  Final  Gastrointestinal Panel by PCR , Stool     Status: None   Collection Time: 07/22/15  1:05 PM  Result Value Ref Range Status   Campylobacter species NOT DETECTED NOT DETECTED Final   Plesimonas shigelloides NOT DETECTED NOT DETECTED Final   Salmonella species NOT DETECTED NOT DETECTED Final   Yersinia enterocolitica NOT DETECTED NOT DETECTED Final   Vibrio species NOT DETECTED NOT DETECTED Final   Vibrio cholerae NOT DETECTED NOT DETECTED Final   Enteroaggregative E coli (EAEC) NOT DETECTED NOT DETECTED Final   Enteropathogenic E coli (EPEC) NOT DETECTED NOT DETECTED Final   Enterotoxigenic E coli (ETEC) NOT DETECTED NOT DETECTED Final   Shiga like toxin producing E coli (STEC) NOT DETECTED NOT DETECTED Final   E. coli O157 NOT DETECTED NOT DETECTED Final   Shigella/Enteroinvasive E coli (EIEC) NOT DETECTED NOT DETECTED Final   Cryptosporidium NOT DETECTED NOT DETECTED Final   Cyclospora cayetanensis NOT DETECTED NOT DETECTED Final   Entamoeba histolytica NOT DETECTED NOT DETECTED Final   Giardia lamblia NOT DETECTED NOT DETECTED Final   Adenovirus F40/41 NOT DETECTED NOT DETECTED Final   Astrovirus NOT DETECTED NOT DETECTED Final   Norovirus GI/GII NOT DETECTED NOT DETECTED Final   Rotavirus A NOT DETECTED NOT DETECTED Final   Sapovirus (I, II, IV, and V) NOT DETECTED NOT  DETECTED Final  MRSA PCR Screening     Status: None   Collection Time: 07/22/15  6:34 PM  Result Value Ref Range Status   MRSA by PCR NEGATIVE NEGATIVE Final    Comment:        The GeneXpert MRSA Assay (FDA approved for NASAL specimens only), is one component of a comprehensive MRSA colonization surveillance program. It is not intended to diagnose MRSA infection nor to guide or monitor treatment for MRSA infections.      BRIEF HOSPITAL COURSE:  Acute metabolic encephalopathy: Resolved, likely secondary to UTI and colitis. Has significant dementia at baseline, her mental status currently appears to be back to usual baseline.  UTI: Very hard to discern whether this is symptomatic UTI or asymptomatic bacteriuria, urine culture shows multiple bacterial morphotypes. Has completed more than 3 days of Rocephin.No point in the reculturing urine as patient already on IV antibiotics.No need for further antibiotics on discharge  Colitis: Likely ischemic colitis. Stool GI pathogen panel negative. Abd is soft-with stool in colostomy. Tolerating diet and pain well controlled.   Anemia: Hemoglobin stable-anemia is likely a combination of chronic iron deficiency from sigmoid mass and anemia of chronic disease. Follow periodically if feasible. No indication for transfusion at this time. Family electing to transition to home hospice care.   History of atrial fibrillation: Continue bisoprolol for rate control, anticoagulation discontinued due to concern for bleeding. Furthermore, patient is frail-and now has unresectable metastatic colon cancer and is a poor candidate for long-term anticoagulation. Risks of bleeding discussed with daughter at bedside, she agrees to permanently discontinue anticoagulation. She is aware of risks of catastrophic CVA.  Unresectable sigmoid adenocarcinoma with possible metastatic lesions to the lung:Underwent palliative laparoscopic and open loop colostomy in the [facility] with Dr.  Hassell Done on 06/18/15. Patient has significant dementia, and is currently not aware of diagnosis per family request. Family not interested in pursuing further treatment including oncology evaluation. Per daughter at bedside, family has had a informal telephone consultation with oncologist (Dr Parks Neptune is a family friend, and have decided not to pursue any form oncology evaluation for chemotherapy/radiation. Family aware of poor overall prognoses, interested in taking patient home with home hospice.  History of dementia with delirium: Currently stable.  Chronic pain syndrome: Mostly in  the left lower quadrant-likely secondary to underlying malignancy. Pain is currently well controlled with  methadone  5 mg and prn oxycodone for breakthrough pain. Continue on bowel regimen with MiraLAX.  Hypertension: Controlled with bisoprolol, clonidine and lisinopril.  Palliative care: 82 year old patient with advanced dementia-now recently diagnosed with unresectable sigmoid adenocarcinoma with possible metastatic lesions to the lung. Very poor overall prognoses, suspected left expectancy only a few months at best. Palliative care following, family plans on taking her home with hospice-daughter aware that it will take more than a few days to find the right pain/anxiety regimen for comfort. Side effects of narcotics including sedation, constipation etc discussed.  TODAY-DAY OF DISCHARGE:  Subjective:   Temika Prothro today has no headache,no chest abdominal pain,no new weakness tingling or numbness, feels much better wants to go home today.   Objective:   Blood pressure 125/99, pulse 86, temperature 97.6 F (36.4 C), temperature source Oral, resp. rate 16, height 5\' 3"  (1.6 m), weight 57.2 kg (126 lb 1.7 oz), SpO2 97 %. No intake or output data in the 24 hours ending 07/25/15 1118 Filed Weights   07/22/15 0900  Weight: 57.2 kg (126 lb 1.7 oz)    Exam Awake Alert, Oriented *3, No new F.N deficits, Normal  affect-sleeping comfortably-awakens easily Webb.AT,PERRAL Supple Neck,No JVD, No cervical lymphadenopathy appriciated.  Symmetrical Chest wall movement, Good air movement bilaterally, CTAB RRR,No Gallops,Rubs or new Murmurs, No Parasternal Heave +ve B.Sounds, Abd Soft, Non tender, No organomegaly appriciated, No rebound -guarding or rigidity. No Cyanosis, Clubbing or edema, No new Rash or bruise  DISCHARGE CONDITION: Stable  DISPOSITION: Home with home Hospice  DISCHARGE INSTRUCTIONS:    Activity:  As tolerated with Full fall precautions use walker/cane & assistance as needed  Get Medicines reviewed and adjusted: Please take all your medications with you for your next visit with your Primary MD  Please request your Primary MD to go over all hospital tests and procedure/radiological results at the follow up, please ask your Primary MD to get all Hospital records sent to his/her office.  If you experience worsening of your admission symptoms, develop shortness of breath, life threatening emergency, suicidal or homicidal thoughts you must seek medical attention immediately by calling 911 or calling your MD immediately  if symptoms less severe.  You must read complete instructions/literature along with all the possible adverse reactions/side effects for all the Medicines you take and that have been prescribed to you. Take any new Medicines after you have completely understood and accpet all the possible adverse reactions/side effects.   Do not drive when taking Pain medications.   Do not take more than prescribed Pain, Sleep and Anxiety Medications  Special Instructions: If you have smoked or chewed Tobacco  in the last 2 yrs please stop smoking, stop any regular Alcohol  and or any Recreational drug use.  Wear Seat belts while driving.  Please note  You were cared for by a hospitalist during your hospital stay. Once you are discharged, your primary care physician will handle any  further medical issues. Please note that NO REFILLS for any discharge medications will be authorized once you are discharged, as it is imperative that you return to your primary care physician (or establish a relationship with a primary care physician if you do not have one) for your aftercare needs so that they can reassess your need for medications and monitor your lab values.  Diet recommendation: Regular Diet  Discharge Instructions    Call MD for:  persistant nausea and vomiting    Complete by:  As directed      Call MD for:  severe uncontrolled pain    Complete by:  As directed      Diet general    Complete by:  As directed      Increase activity slowly    Complete by:  As directed            Follow-up Information    Schedule an appointment as soon as possible for a visit with Marton Redwood, MD.   Specialty:  Internal Medicine   Why:  As needed   Contact information:   [place] [address] [phone]       Total Time spent on discharge equals  45 minutes.  SignedOren Binet 07/25/2015 11:18 AM

## 2015-07-25 NOTE — Progress Notes (Signed)
Discharge instructions given to daughter [name], verbalized understanding, teach back utilized. Patient is comfortable,no agitation noted. Will call ambulance before 7 pm per daughter's request.

## 2016-03-23 DEATH — deceased

## 2016-10-08 IMAGING — CR DG CHEST 2V
3 series · 3 of 3 positions shown · non-contrast
Comparison: 06/10/2015

CLINICAL DATA: Intermittent fever.  Lower extremity swelling.

EXAM:
CHEST  2 VIEW

[w chest lat (1 of 2)]
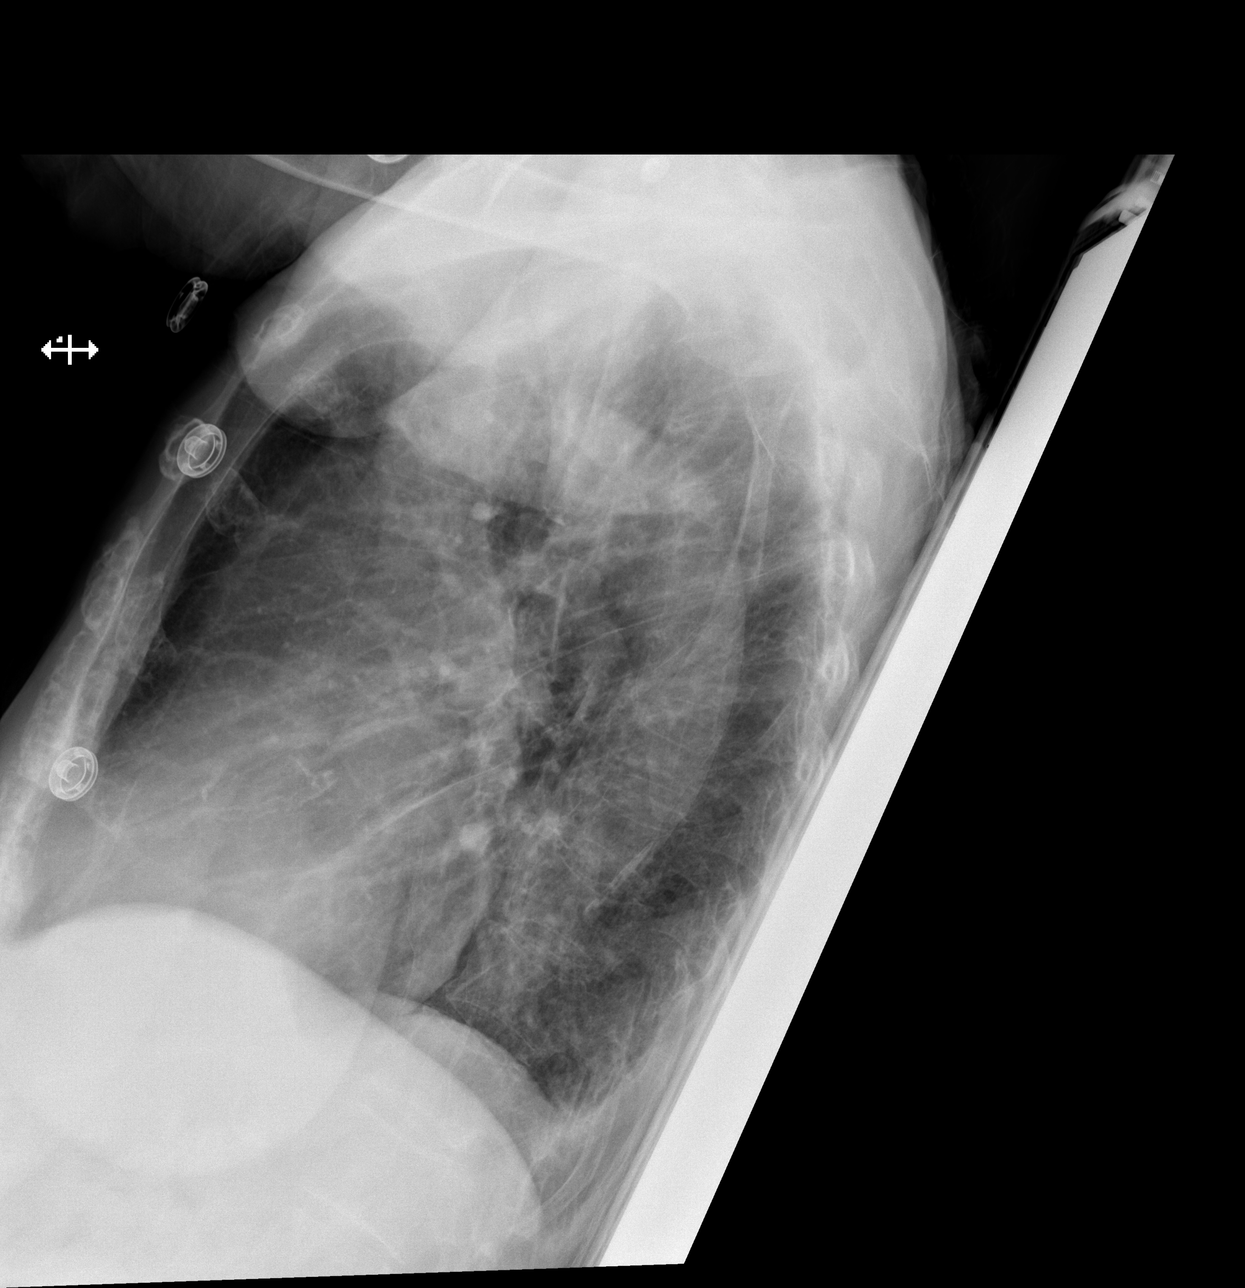

[w chest lat (2 of 2)]
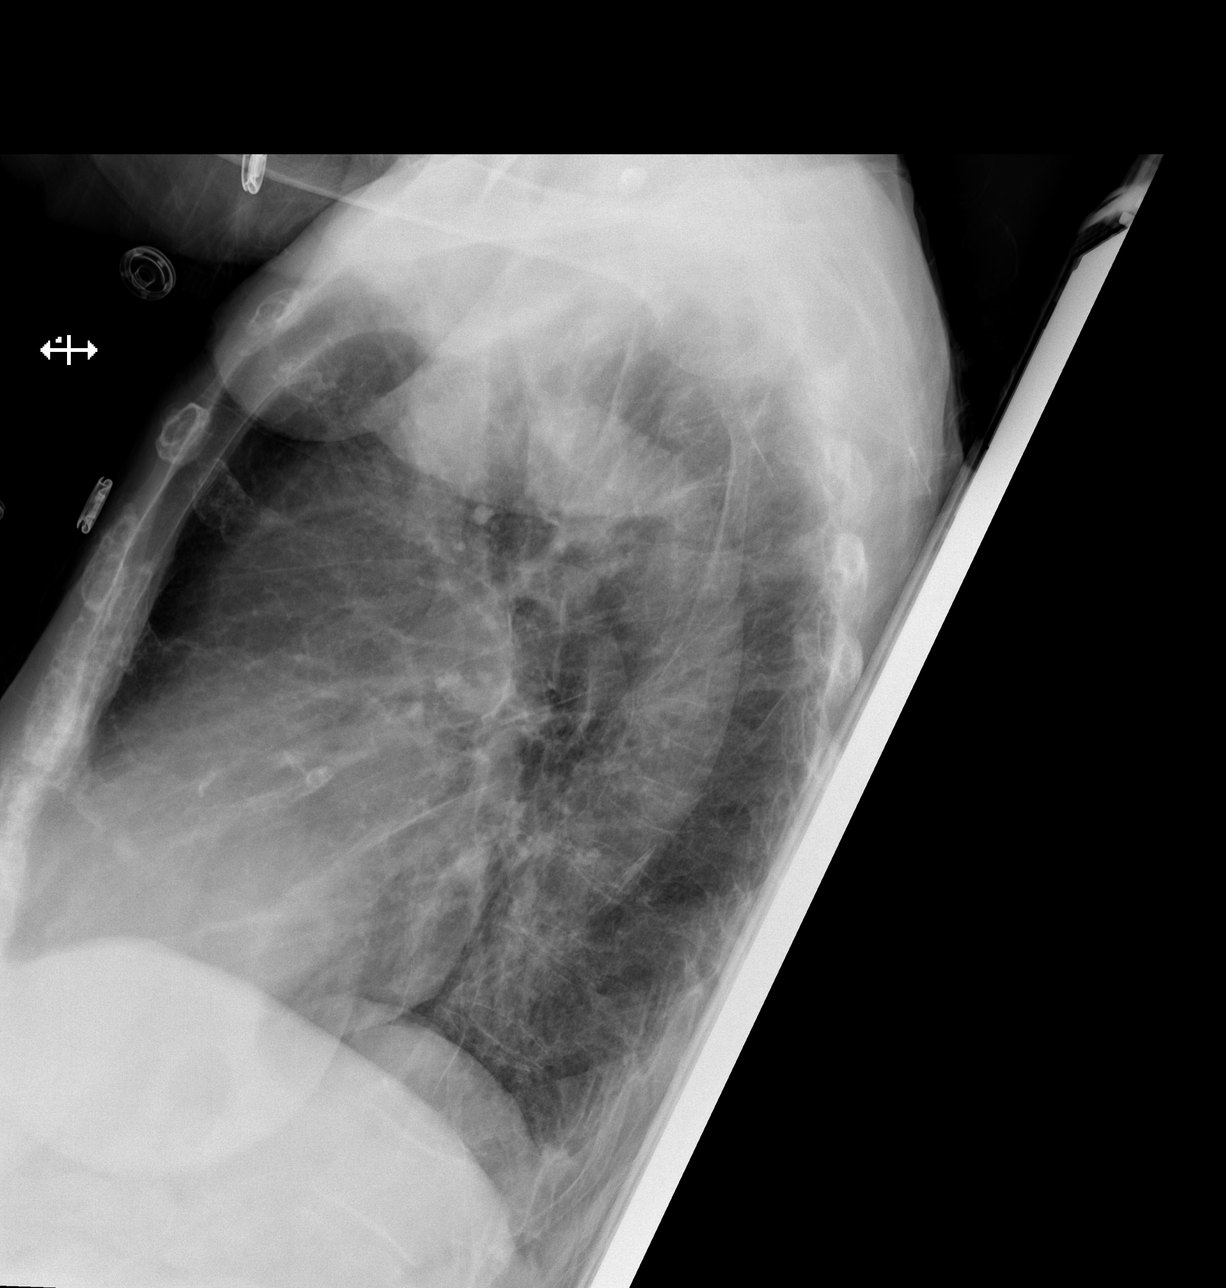

[x chest ap]
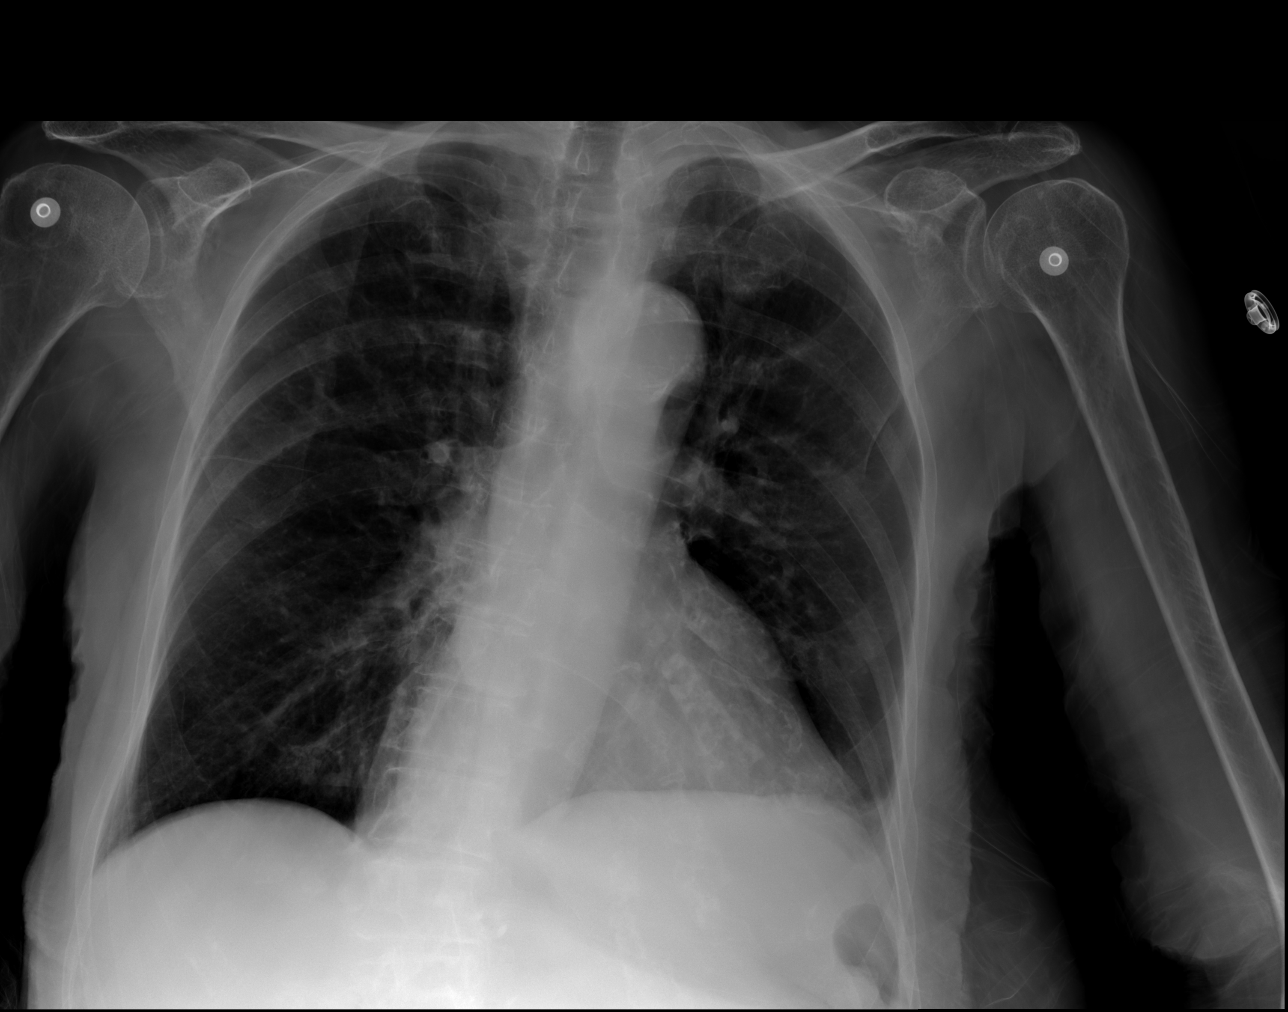

[3 of 3 positions shown; findings below may reference images not displayed]

FINDINGS: There is unchanged mild left hemidiaphragm elevation. Mild linear
scarring in the lateral left lung base. The lungs are otherwise
clear. Hilar and mediastinal contours are unremarkable and
unchanged. Pulmonary vasculature is normal. No effusions are
evident.
IMPRESSION: No active cardiopulmonary disease.

## 2016-10-10 IMAGING — DX DG ABDOMEN 2V
2 series · 2 of 2 positions shown · non-contrast
Comparison: 07/22/2015 CT abdomen/pelvis.

CLINICAL DATA: Abdominal pain. Sigmoid colon cancer status post
resection and colostomy.

EXAM:
ABDOMEN - 2 VIEW

[abdomen supine]
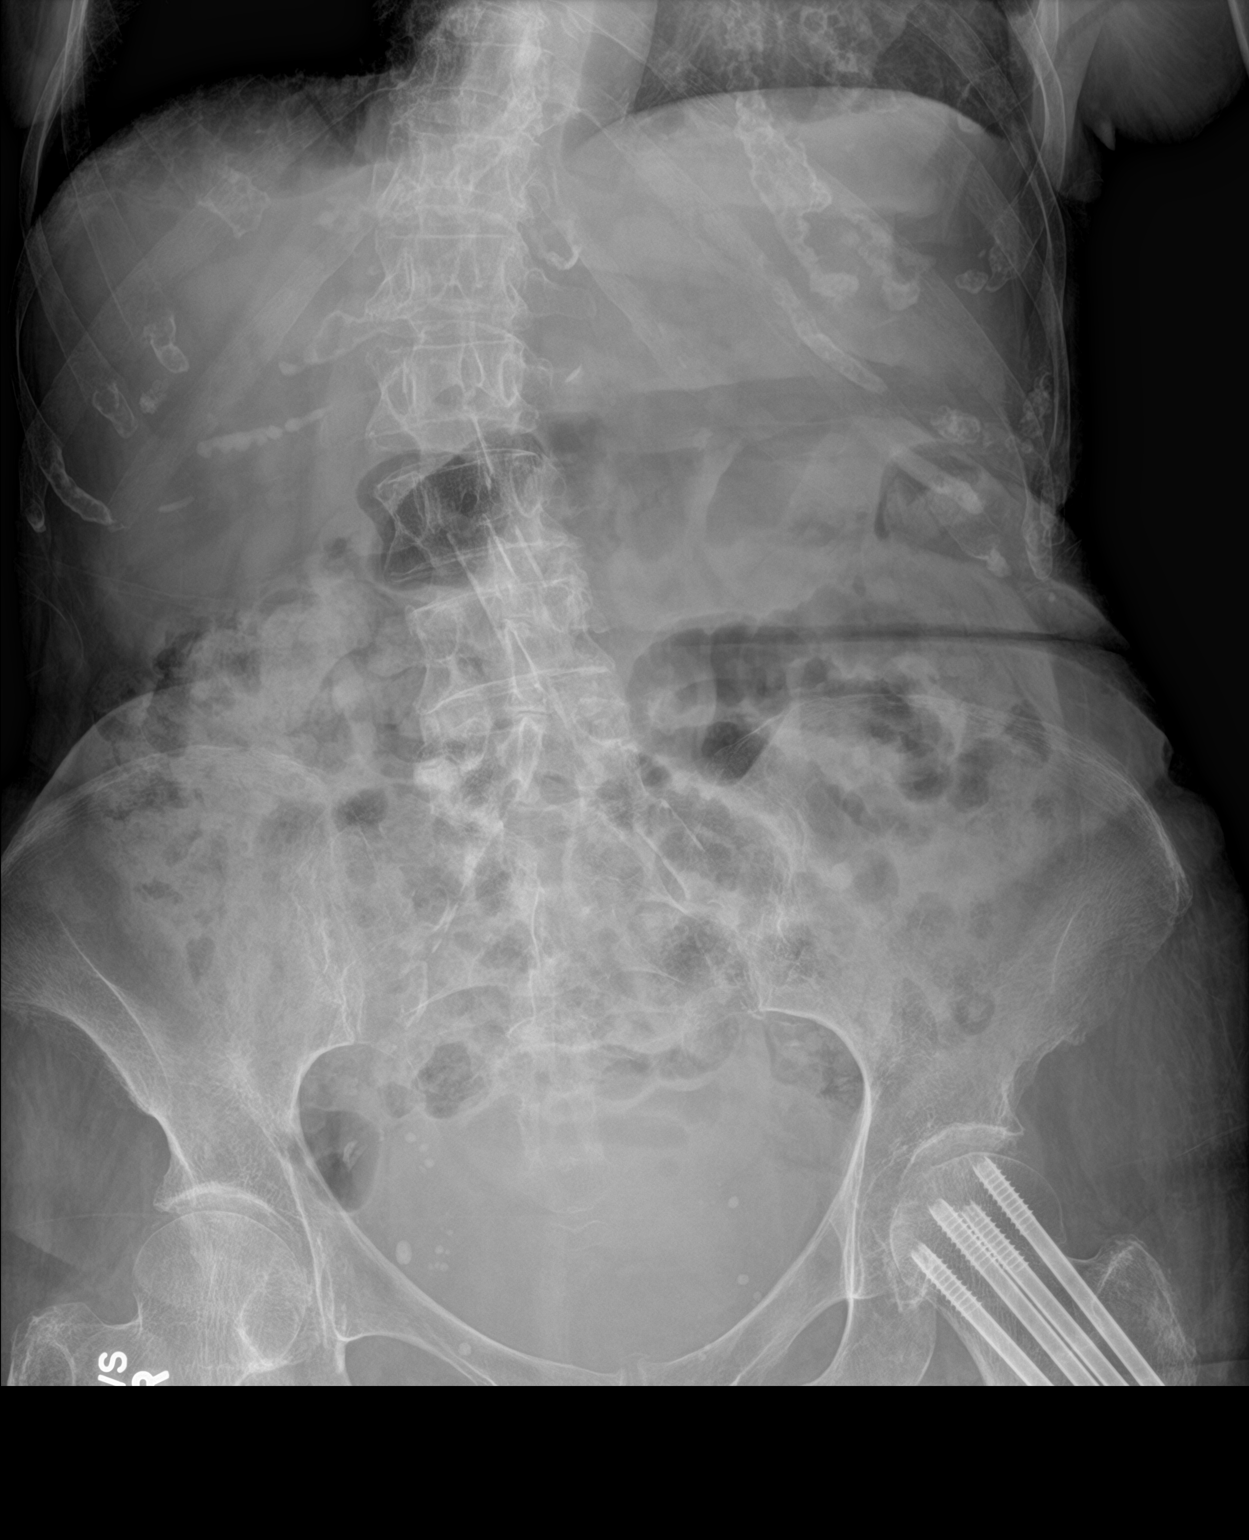

[abdomen decu]
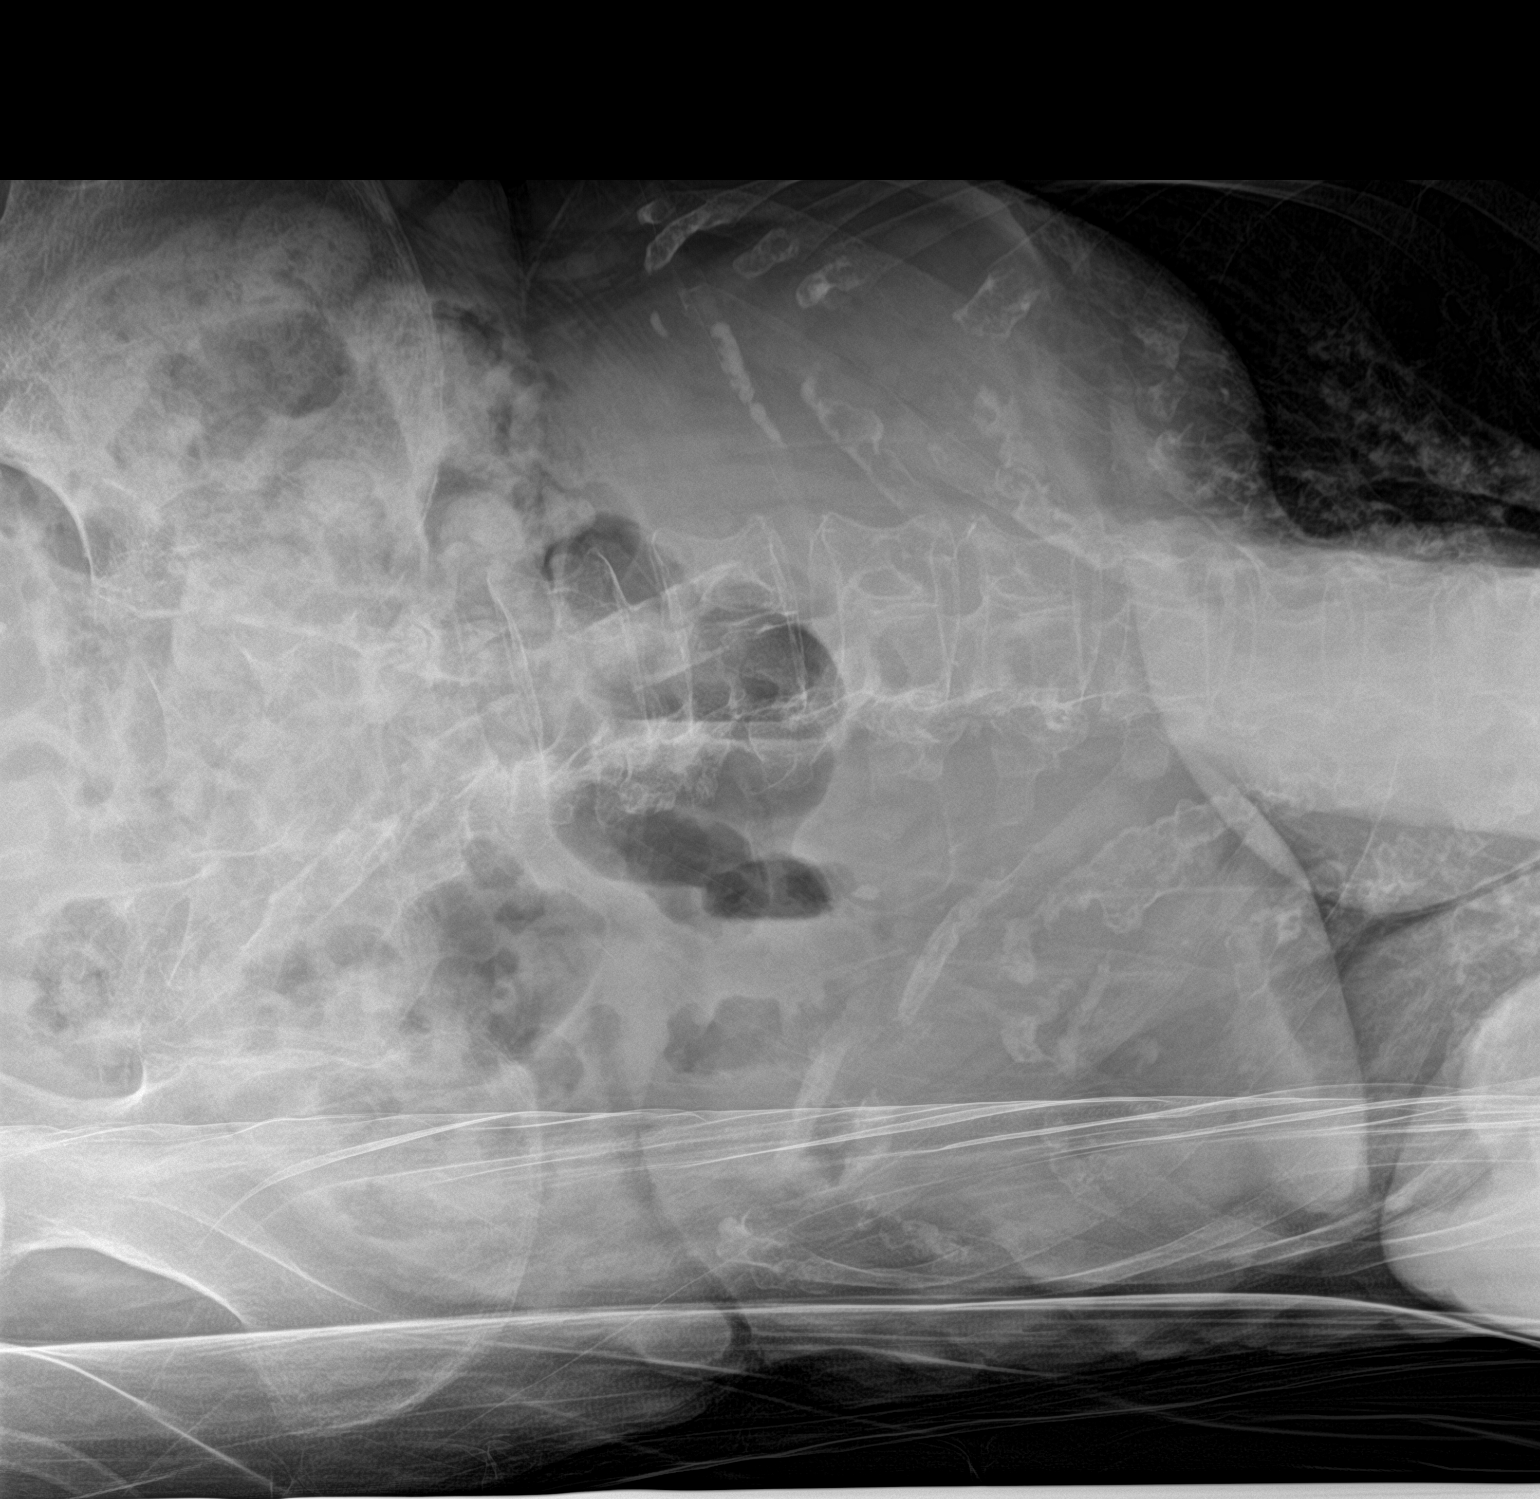

[2 of 2 positions shown; findings below may reference images not displayed]

FINDINGS: There are a few top-normal caliber small bowel loops with air-fluid
levels in the left abdomen. Moderate stool in the proximal colon. No
evidence of pneumatosis or pneumoperitoneum. Mild long segment
dextrocurvature of the thoracolumbar spine. Mild-to-moderate
degenerative changes in the visualized thoracolumbar spine. Trace
left pleural effusion. Partially visualized screws in the left
proximal femur.
IMPRESSION: Top-normal caliber small bowel loops with air-fluid levels in the
left abdomen. Moderate proximal colonic stool. Findings suggest a
mild adynamic ileus.

Trace left pleural effusion.
